# Patient Record
Sex: Male | Born: 1965
Health system: Southern US, Community
[De-identification: ages and names within clinical notes are randomized; demographics above are authoritative.]

## PROBLEM LIST (undated history)

## (undated) DIAGNOSIS — J189 Pneumonia, unspecified organism: Secondary | ICD-10-CM

## (undated) DIAGNOSIS — G4730 Sleep apnea, unspecified: Secondary | ICD-10-CM

## (undated) DIAGNOSIS — E785 Hyperlipidemia, unspecified: Secondary | ICD-10-CM

## (undated) DIAGNOSIS — N183 Chronic kidney disease, stage 3 unspecified: Secondary | ICD-10-CM

## (undated) DIAGNOSIS — Z905 Acquired absence of kidney: Secondary | ICD-10-CM

## (undated) DIAGNOSIS — C801 Malignant (primary) neoplasm, unspecified: Secondary | ICD-10-CM

## (undated) DIAGNOSIS — I499 Cardiac arrhythmia, unspecified: Secondary | ICD-10-CM

## (undated) DIAGNOSIS — I4819 Other persistent atrial fibrillation: Secondary | ICD-10-CM

## (undated) DIAGNOSIS — Z720 Tobacco use: Secondary | ICD-10-CM

## (undated) DIAGNOSIS — E119 Type 2 diabetes mellitus without complications: Secondary | ICD-10-CM

## (undated) DIAGNOSIS — I429 Cardiomyopathy, unspecified: Secondary | ICD-10-CM

## (undated) DIAGNOSIS — Z87442 Personal history of urinary calculi: Secondary | ICD-10-CM

## (undated) DIAGNOSIS — D649 Anemia, unspecified: Secondary | ICD-10-CM

## (undated) DIAGNOSIS — D751 Secondary polycythemia: Secondary | ICD-10-CM

## (undated) DIAGNOSIS — J45998 Other asthma: Secondary | ICD-10-CM

## (undated) DIAGNOSIS — T884XXA Failed or difficult intubation, initial encounter: Secondary | ICD-10-CM

## (undated) DIAGNOSIS — I10 Essential (primary) hypertension: Secondary | ICD-10-CM

## (undated) HISTORY — PX: TONSILLECTOMY: SUR1361

## (undated) HISTORY — DX: Secondary polycythemia: D75.1

## (undated) HISTORY — DX: Other asthma: J45.998

## (undated) HISTORY — DX: Acquired absence of kidney: Z90.5

## (undated) HISTORY — PX: NEPHRECTOMY: SHX65

## (undated) HISTORY — PX: OTHER SURGICAL HISTORY: SHX169

---

## 2003-09-11 ENCOUNTER — Other Ambulatory Visit: Payer: Self-pay

## 2013-01-24 ENCOUNTER — Emergency Department: Payer: Self-pay | Admitting: Emergency Medicine

## 2013-01-24 LAB — URINALYSIS, COMPLETE
Bilirubin,UR: NEGATIVE
Ketone: NEGATIVE
Nitrite: NEGATIVE
Protein: >=500
RBC,UR: 2 /HPF (ref 0–5)
Specific Gravity: 1.033 (ref 1.003–1.030)
Squamous Epithelial: 1

## 2013-01-24 LAB — CBC
HGB: 17 g/dL (ref 13.0–18.0)
MCH: 32.1 pg (ref 26.0–34.0)
MCHC: 34.5 g/dL (ref 32.0–36.0)
Platelet: 172 10*3/uL (ref 150–440)
RBC: 5.29 10*6/uL (ref 4.40–5.90)
RDW: 13.1 % (ref 11.5–14.5)
WBC: 9.5 10*3/uL (ref 3.8–10.6)

## 2013-01-24 LAB — COMPREHENSIVE METABOLIC PANEL
Albumin: 3.6 g/dL (ref 3.4–5.0)
Alkaline Phosphatase: 105 U/L (ref 50–136)
Chloride: 108 mmol/L — ABNORMAL HIGH (ref 98–107)
Creatinine: 1.25 mg/dL (ref 0.60–1.30)
EGFR (Non-African Amer.): >60
Glucose: 120 mg/dL — ABNORMAL HIGH (ref 65–99)
Total Protein: 7.3 g/dL (ref 6.4–8.2)

## 2015-05-05 ENCOUNTER — Encounter: Payer: Self-pay | Admitting: Emergency Medicine

## 2015-05-05 ENCOUNTER — Ambulatory Visit (INDEPENDENT_AMBULATORY_CARE_PROVIDER_SITE_OTHER): Payer: BLUE CROSS/BLUE SHIELD

## 2015-05-05 ENCOUNTER — Ambulatory Visit
Admission: EM | Admit: 2015-05-05 | Discharge: 2015-05-05 | Disposition: A | Discharge status: Home / Self Care | Payer: BLUE CROSS/BLUE SHIELD | Attending: Internal Medicine | Admitting: Internal Medicine

## 2015-05-05 DIAGNOSIS — S143XXA Injury of brachial plexus, initial encounter: Secondary | ICD-10-CM

## 2015-05-05 MED ORDER — PREDNISONE 50 MG PO TABS: 50.0000 mg | ORAL_TABLET | Freq: Every day | ORAL | Status: DC

## 2015-05-05 NOTE — Discharge Instructions (Signed)
Neurapraxia Neurapraxia is a temporary loss of nerve function. It does not cause permanent damage to a nerve. If your foot "falls asleep," that is a type of neurapraxia. It will go away as soon as you start moving your foot. A more serious neurapraxia could take up to 6 weeks to go away. CAUSES   Anything that strains a nerve can cause neurapraxia. The nerve might be stretched or twisted. Something might press or pound on it and cause decreased blood flow to the nerve. Causes of neurapraxia can include:  Bones that break (fracture) or move out of place (dislocation). This can cause the nerves to stretch or twist out of their normal position. This happens most often in the arms and shoulders.  Neck strain when the head moves suddenly, such as in a car crash. This is called whiplash (cervical neurapraxia). It can also happen while playing sports. For example, a football injury called a stinger is a type of neurapraxia. It causes severe pain to shoot down an arm.  Stretching the neck too far from the shoulder. This damages the nerves that go into the shoulder, arm, and hand. It causes numbness and muscle weakness. This condition is called brachial plexus neurapraxia.  Repeated or prolonged pressure can cause decreased blood flow to the nerve (ischemic neurapraxia). Such causes of neurapraxia can include:  A cast or bandage that is too tight around an injured arm or leg. This limits blood flow to the nerves and causes a condition called compartment syndrome. This condition develops when pressure builds up around muscles and nerves.  Infection and disease. This can cut off blood flow to the nerve.  Very cold temperatures.  Sleeping in a position that limits blood flow to your leg or arm. SIGNS AND SYMPTOMS  Numbness and tingling.  Muscle weakness.  Burning pain.  Cool skin. DIAGNOSIS  Neurapraxia is diagnosed through:  A physical exam. This will include asking questions about your health.  Your health care provider will ask about any symptoms you are having. Your health care provider may also:  Test how strong your muscles are.  Check feeling (sensation) in various areas of your body. A very light touch or pricks with a pin may be used.  Check whether you have signs of nerve damage on one side of the body or both.  Tests such as:  Electromyography (EMG). This test measures electrical activity in a muscle. It shows whether the nerve that supplies the muscle is working.  Nerve conduction studies (NCS). They measure the flow of electricity through a nerve.  Magnetic resonance imaging (MRI). This is a machine that uses magnets and a computer to create pictures of your nerves. TREATMENT  Treatment aims to ease pain and swelling and to provide support while your body heals.  Medicine may include:  Pain medicine.  Antidepressants.  Seizure medicine.  Medicine to reduce swelling.  For support, options may include:  Braces, walkers, or crutches.  Physical therapy. Having a specialist work with you often speeds healing. It can also help prevent stiffness and future damage.  Surgery. This may be needed if broken or dislocated bones are part of the problem. Surgery also may be done to relieve pressure on nerves or to restore normal blood flow to nerves and muscles.  Electrical stimulators. These devices send pulses of electricity into the muscles. The aim is to bring back movement. HOME CARE INSTRUCTIONS What you need to do at home will vary. It will depend on your treatment  plan and the type of neurapraxia you have. In general: °· Take medicine as told by your health care provider. Follow the directions carefully. °· Rest. Give your body time to heal. °· Use any splints, braces, or other support devices as directed. If you have questions about their use, ask your health care provider. °· Start physical therapy, if that is suggested. Ask if it is okay to practice the  exercises at home, too. °· If areas of your body are numb, take care to protect them from burns or other injury. °· If you had surgery, you will need to care for your surgical cut (incision). Ask for instructions before you leave the hospital. °· Keep all follow-up appointments with your health care provider. °SEEK MEDICAL CARE IF:  °· You have any questions about your medicine. °· Numbness or muscle weakness continues. °· Pain continues, even after taking pain medicine. °SEEK IMMEDIATE MEDICAL CARE IF:  °· Your pain suddenly becomes severe. °· Numbness or weakness gets much worse. °· Your muscles start to twitch or you have muscle spasms. °  °This information is not intended to replace advice given to you by your health care provider. Make sure you discuss any questions you have with your health care provider. °  °Document Released: 07/31/2010 Document Revised: 03/19/2014 Document Reviewed: 08/30/2014 °Elsevier Interactive Patient Education ©2016 [employer] ° °

## 2015-05-05 NOTE — ED Notes (Signed)
Patient states that 3 weeks ago he was pulling the latch to release a trailer and developed right shoulder pain and has limited ROM.

## 2015-05-05 NOTE — ED Provider Notes (Signed)
CSN: [id number]     Arrival date & time 05/05/15  1621 History   First MD Initiated Contact with Patient 05/05/15 1800     Chief Complaint  Patient presents with  . Shoulder Pain   (Consider location/radiation/quality/duration/timing/severity/associated sxs/prior Treatment) HPI   This 50 year old male who presents with an injury that occurred 3 weeks ago when he was pulling on a lever hard to dislodge a fifth wheel on his truck. He states that he had immediate pain in his neck that radiated into his trapezius and down his arm. The pain bothered him for about a week and then had subsided but he has noticed that he has the inability to abduct his arm away from his side. He has been able to regain some of the motion into it in his elbow and hand at first it if his sounds as if he had loss of function as well. He has a numbness of his thumb but not the index finger. He kept thinking that the pain would go away and was decided that he would eventually regain the use of his shoulder but this has not been the case. At the present time he has some discomfort in his neck  History reviewed. No pertinent past medical history. Past Surgical History  Procedure Laterality Date  . Nephrectomy Right    History reviewed. No pertinent family history. Social History  Substance Use Topics  . Smoking status: Current Every Day Smoker -- 1.00 packs/day    Types: Cigarettes  . Smokeless tobacco: None  . Alcohol Use: Yes    Review of Systems  Constitutional: Positive for activity change. Negative for fever, chills and fatigue.  Musculoskeletal: Positive for neck pain.  All other systems reviewed and are negative.   Allergies  Review of patient's allergies indicates no known allergies.  Home Medications   Prior to Admission medications   Medication Sig Start Date End Date Taking? Authorizing Provider  predniSONE (DELTASONE) 50 MG tablet Take 1 tablet (50 mg total) by mouth daily. 05/05/15   Lorin Picket, PA-C   Meds Ordered and Administered this Visit  Medications - No data to display  BP 120/73 mmHg  Pulse 86  Temp(Src) 98 F (36.7 C) (Oral)  Resp 16  Ht 6' (1.829 m)  Wt 245 lb (111.131 kg)  BMI 33.22 kg/m2  SpO2 97% No data found.   Physical Exam  Constitutional: He is oriented to person, place, and time. He appears well-developed and well-nourished. No distress.  HENT:  Head: Normocephalic and atraumatic.  Nose: Nose normal.  Mouth/Throat: Oropharynx is clear and moist. No oropharyngeal exudate.  Both ear canals are occluded with cerumen  Eyes: Conjunctivae are normal. Pupils are equal, round, and reactive to light.  Neck: Normal range of motion. Neck supple.  Musculoskeletal: He exhibits no edema or tenderness.  Examination cervical spine shows fairly good range of motion with discomfort at the extremes of flexion extension and rotation. Flexion and extension vertically on the left is the most painful. Upper extremity sensation is intact to light touch except for the thumb which has a slight hip esthesia. The patient is unable to abduct his shoulder past approximately 15 but passively has full motion. There is weakness of the biceps and external rotation forearm supination more than pronation.  Neurological: He is alert and oriented to person, place, and time. He exhibits normal muscle tone.  Skin: Skin is warm and dry. He is not diaphoretic.  Psychiatric: He has a normal  mood and affect. His behavior is normal. Judgment and thought content normal.  Nursing note and vitals reviewed.   ED Course  Procedures (including critical care time)  Labs Review Labs Reviewed - No data to display  Imaging Review Dg Cervical Spine Complete  05/05/2015  CLINICAL DATA:  Pt was having pain from the back of his neck down to his fingertips on his right arm for 3 weeks. He believes he hurt it attaching a 5th wheel to his truck. Now he has limited feeling and motion in his right  arm. EXAM: CERVICAL SPINE - COMPLETE 4+ VIEW COMPARISON:  None. FINDINGS: No fracture.  No spondylolisthesis. Endplate spurring noted at C5-C6. No other disc degenerative change. No significant loss disc height. Mild uncovertebral spurring is noted. Left neural foramen are well evaluated with no significant narrowing. Right neural foramina are suboptimally evaluated due to positioning. There is at least mild neural foraminal narrowing on the right from C3-C4 through C5-C6 from uncovertebral spurring. Soft tissues are unremarkable. IMPRESSION: 1. No fracture or acute finding. 2. Right neural foramen are not well evaluated on this study. There is uncovertebral spurring. Some neural foraminal narrowing from C3-C4 through C5-C6 is present. No significant left neural foraminal narrowing. Electronically Signed   By: Lajean Manes M.D.   On: 05/05/2015 19:04   Dg Shoulder Right  05/05/2015  CLINICAL DATA:  Right shoulder pain with limited range of motion. Pain for 3 weeks. EXAM: RIGHT SHOULDER - 2+ VIEW COMPARISON:  None. FINDINGS: There is no evidence of fracture or dislocation. There is no evidence of arthropathy or other focal bone abnormality. Soft tissues are unremarkable. IMPRESSION: Negative radiographs of the right shoulder. Electronically Signed   By: Jeb Levering M.D.   On: 05/05/2015 19:04     Visual Acuity Review  Right Eye Distance:   Left Eye Distance:   Bilateral Distance:    Right Eye Near:   Left Eye Near:    Bilateral Near:         MDM   1. Injury of right brachial plexus, initial encounter    Discharge Medication List as of 05/05/2015  7:25 PM    START taking these medications   Details  predniSONE (DELTASONE) 50 MG tablet Take 1 tablet (50 mg total) by mouth daily., Starting 05/05/2015, Until Discontinued, Normal      Plan: 1. Test/x-ray results and diagnosis reviewed with patient 2. rx as per orders; risks, benefits, potential side effects reviewed with patient 3.  Recommend supportive treatment with rest and maintaining range of motion to prevent frozen shoulder. I will start him a short course of prednisone avoiding them make an appointment with a neurologist for further evaluation studies. His wife would prefer that he be sent to [facility] and she will make the appointment since she is working there is a Presenter, broadcasting. I told him that I like to see have him seen sooner than later it has been already been 3 weeks since the initial injury and he has started to improve somewhat. They will make an appointment as soon as possible 4. F/u prn if symptoms worsen or don't improve     Lorin Picket, PA-C 05/05/15 1941

## 2017-03-14 DIAGNOSIS — J45909 Unspecified asthma, uncomplicated: Secondary | ICD-10-CM | POA: Insufficient documentation

## 2017-03-14 DIAGNOSIS — I10 Essential (primary) hypertension: Secondary | ICD-10-CM | POA: Insufficient documentation

## 2017-05-15 DIAGNOSIS — Z905 Acquired absence of kidney: Secondary | ICD-10-CM | POA: Insufficient documentation

## 2017-11-28 ENCOUNTER — Inpatient Hospital Stay: Payer: Self-pay | Admitting: Internal Medicine

## 2017-12-03 ENCOUNTER — Inpatient Hospital Stay: Payer: Managed Care, Other (non HMO) | Attending: Internal Medicine | Admitting: Internal Medicine

## 2017-12-03 ENCOUNTER — Other Ambulatory Visit: Payer: Self-pay | Admitting: *Deleted

## 2017-12-03 ENCOUNTER — Encounter: Payer: Self-pay | Admitting: *Deleted

## 2017-12-03 ENCOUNTER — Inpatient Hospital Stay: Payer: Managed Care, Other (non HMO)

## 2017-12-03 DIAGNOSIS — R5383 Other fatigue: Secondary | ICD-10-CM | POA: Insufficient documentation

## 2017-12-03 DIAGNOSIS — Z79899 Other long term (current) drug therapy: Secondary | ICD-10-CM | POA: Insufficient documentation

## 2017-12-03 DIAGNOSIS — F17210 Nicotine dependence, cigarettes, uncomplicated: Secondary | ICD-10-CM | POA: Diagnosis not present

## 2017-12-03 DIAGNOSIS — D751 Secondary polycythemia: Secondary | ICD-10-CM | POA: Insufficient documentation

## 2017-12-03 DIAGNOSIS — J45909 Unspecified asthma, uncomplicated: Secondary | ICD-10-CM

## 2017-12-03 LAB — CBC WITH DIFFERENTIAL/PLATELET
BASOS ABS: 0.1 10*3/uL (ref 0–0.1)
Basophils Relative: 1 %
EOS PCT: 3 %
Eosinophils Absolute: 0.3 10*3/uL (ref 0–0.7)
HCT: 49.8 % (ref 40.0–52.0)
HEMOGLOBIN: 17.4 g/dL (ref 13.0–18.0)
LYMPHS ABS: 1.7 10*3/uL (ref 1.0–3.6)
LYMPHS PCT: 20 %
MCH: 32.7 pg (ref 26.0–34.0)
MCHC: 35 g/dL (ref 32.0–36.0)
MCV: 93.5 fL (ref 80.0–100.0)
Monocytes Absolute: 0.7 10*3/uL (ref 0.2–1.0)
Monocytes Relative: 8 %
Neutro Abs: 5.7 10*3/uL (ref 1.4–6.5)
Neutrophils Relative %: 68 %
PLATELETS: 173 10*3/uL (ref 150–440)
RBC: 5.32 MIL/uL (ref 4.40–5.90)
RDW: 13 % (ref 11.5–14.5)
WBC: 8.4 10*3/uL (ref 3.8–10.6)

## 2017-12-03 LAB — LACTATE DEHYDROGENASE: LDH: 131 U/L (ref 98–192)

## 2017-12-03 NOTE — Progress Notes (Signed)
[place] NOTE  Patient Care Team: [name], PA-C as PCP - General (Family Medicine)  CHIEF COMPLAINTS/PURPOSE OF CONSULTATION: ERYTHROCYOTSOS  #Erythrocytosis [SEP 2019- PCP- HCT-56/hb- 41; N-wbc/platelets]  # solitary left kidney [traumatic injury at 62 y]; Smoker; obese/ ? OSA  No history exists.     HISTORY OF PRESENTING ILLNESS:  Tracy Gardner 52 y.o.  male has been referred to [place] for further evaluation recommendations for erythrocytosis.  Patient denies any prior history of strokes or blood clots.  Denies any tingling or numbness in the feet.  Complains of fatigue.  Patient states that he wakes up in the morning tired.  Most of the last 2 to 3 years.  No weight loss.  Appetite is good.  No night sweats.  Patient works as a Administrator.  Patient smokes.   Review of Systems  Constitutional: Positive for malaise/fatigue. Negative for chills, diaphoresis, fever and weight loss.  HENT: Negative for nosebleeds and sore throat.   Eyes: Negative for double vision.  Respiratory: Negative for cough, hemoptysis, sputum production, shortness of breath and wheezing.   Cardiovascular: Negative for chest pain, palpitations, orthopnea and leg swelling.  Gastrointestinal: Negative for abdominal pain, blood in stool, constipation, diarrhea, heartburn, melena, nausea and vomiting.  Genitourinary: Negative for dysuria, frequency and urgency.  Musculoskeletal: Negative for back pain and joint pain.  Skin: Negative.  Negative for itching and rash.  Neurological: Negative for dizziness, tingling, focal weakness, weakness and headaches.  Endo/Heme/Allergies: Does not bruise/bleed easily.  Psychiatric/Behavioral: Negative for depression. The patient is not nervous/anxious and does not have insomnia.      MEDICAL HISTORY:  Past Medical History:  Diagnosis Date  . Asthma, persistent not controlled   . History of nephrectomy, unilateral   . Polycythemia      SURGICAL HISTORY: Past Surgical History:  Procedure Laterality Date  . kidney removal  Right   . 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  Social History   Socioeconomic History  . Marital status: Married    Spouse name: Not on file  . Number of children: Not on file  . Years of education: Not on file  . Highest education level: Not on file  Occupational History  . Not on file  Social Needs  . Financial resource strain: Not on file  . Food insecurity:    Worry: Not on file    Inability: Not on file  . Transportation needs:    Medical: Not on file    Non-medical: Not on file  Tobacco Use  . Smoking status: Current Every Day Smoker    Packs/day: 1.00    Types: Cigarettes  . Smokeless tobacco: Never Used  Substance and Sexual Activity  . Alcohol use: Yes  . Drug use: Not on file  . Sexual activity: Not on file  Lifestyle  . Physical activity:    Days per week: Not on file    Minutes per session: Not on file  . Stress: Not on file  Relationships  . Social connections:    Talks on phone: Not on file    Gets together: Not on file    Attends religious service: Not on file    Active member of club or organization: Not on file    Attends meetings of clubs or organizations: Not on file    Relationship status: Not on file  . Intimate partner violence:  Fear of current or ex partner: Not on file    Emotionally abused: Not on file    Physically abused: Not on file    Forced sexual activity: Not on file  Other Topics Concern  . Not on file  Social History Narrative  . Not on file    FAMILY HISTORY: No history of any blood cancers History reviewed. No pertinent family history.  ALLERGIES:  has No Known Allergies.  MEDICATIONS:  Current Outpatient Medications  Medication Sig Dispense Refill  . albuterol (VENTOLIN HFA) 108 (90 Base) MCG/ACT inhaler Inhale 2 puffs into the lungs every 6 (six) hours as  needed for wheezing.    Marland Kitchen buPROPion (WELLBUTRIN XL) 150 MG 24 hr tablet Take 1 tablet by mouth daily.    . enalapril (VASOTEC) 5 MG tablet Take 1 tablet by mouth daily.    . fluticasone (FLOVENT HFA) 44 MCG/ACT inhaler Inhale 2 puffs into the lungs 2 (two) times daily.    . hydrochlorothiazide (HYDRODIURIL) 12.5 MG tablet Take 1 tablet by mouth daily.     No current facility-administered medications for this visit.       Marland Kitchen  PHYSICAL EXAMINATION: ECOG PERFORMANCE STATUS: 0 - Asymptomatic  Vitals:   12/03/17 1440  BP: 111/68  Pulse: 77  Resp: 16  Temp: 98.1 F (36.7 C)   There were no vitals filed for this visit.  Physical Exam  Constitutional: He is oriented to person, place, and time and well-developed, well-nourished, and in no distress.  With his wife.  Obese.  HENT:  Head: Normocephalic and atraumatic.  Mouth/Throat: Oropharynx is clear and moist. No oropharyngeal exudate.  Eyes: Pupils are equal, round, and reactive to light.  Neck: Normal range of motion. Neck supple.  Cardiovascular: Normal rate and regular rhythm.  Pulmonary/Chest: Breath sounds normal. No respiratory distress. He has no wheezes.  Abdominal: Soft. Bowel sounds are normal. He exhibits no distension and no mass. There is no tenderness. There is no rebound and no guarding.  Musculoskeletal: Normal range of motion. He exhibits no edema or tenderness.  Neurological: He is alert and oriented to person, place, and time.  Skin: Skin is warm.  Psychiatric: Affect normal.     LABORATORY DATA:  I have reviewed the data as listed Lab Results  Component Value Date   WBC 8.4 12/03/2017   HGB 17.4 12/03/2017   HCT 49.8 12/03/2017   MCV 93.5 12/03/2017   PLT 173 12/03/2017   No results for input(s): NA, K, CL, CO2, GLUCOSE, BUN, CREATININE, CALCIUM, GFRNONAA, GFRAA, PROT, ALBUMIN, AST, ALT, ALKPHOS, BILITOT, BILIDIR, IBILI in the last 8760 hours.  RADIOGRAPHIC STUDIES: I have personally reviewed the  radiological images as listed and agreed with the findings in the report. No results found.  ASSESSMENT & PLAN:   Erythrocytosis #Erythrocytosis isolated-suspect secondary-smoking OSA.  Rule out polycythemia vera.  #I had a long discussion with patient regarding multiple causes of elevated hemoglobin-primary bone marrow process versus secondary causes.  Discussed that phlebotomy is usually recommended for polycythemia vera to avoid risk of stroke/thromboembolic events. Role phlebotomy in secondary polycythemia is controversial.  Phlebotomy could be offered for symptom control, however the goal should be to identify the underlying cause/and treat the cause.   #Question etiology of erythrocytosis-suspect obstructive sleep apnea.  Recommend sleep study.  Defer to PCP.  #Active smoker: Discussed with the patient regarding the ill effects of smoking- including but not limited to cardiac lung and vascular diseases and malignancies. Patient on Wellbutrin not  helping as per family.  Defer to PCP.  #Recommend checking CBC erythropoietin Jak 2 LDH.  Recommend phlebotomy next week; follow-up with me in approximately 4 weeks discuss the results of the work-up.  Thank you Dr.Linthavong/Jason Whitaker NP for allowing me to participate in the care of your pleasant patient. Please do not hesitate to contact me with questions or concerns in the interim.  Dr.L/Jaosn whitaker  All questions were answered. The patient knows to call the clinic with any problems, questions or concerns.     Cammie Sickle, MD 12/04/2017 9:20 AM

## 2017-12-03 NOTE — Assessment & Plan Note (Addendum)
#  Erythrocytosis isolated-suspect secondary-smoking OSA.  Rule out polycythemia vera.  #I had a long discussion with patient regarding multiple causes of elevated hemoglobin-primary bone marrow process versus secondary causes.  Discussed that phlebotomy is usually recommended for polycythemia vera to avoid risk of stroke/thromboembolic events. Role phlebotomy in secondary polycythemia is controversial.  Phlebotomy could be offered for symptom control, however the goal should be to identify the underlying cause/and treat the cause.   #Question etiology of erythrocytosis-suspect obstructive sleep apnea.  Recommend sleep study.  Defer to PCP.  #Active smoker: Discussed with the patient regarding the ill effects of smoking- including but not limited to cardiac lung and vascular diseases and malignancies. Patient on Wellbutrin not helping as per family.  Defer to PCP.  #Recommend checking CBC erythropoietin Jak 2 LDH.  Recommend phlebotomy next week; follow-up with me in approximately 4 weeks discuss the results of the work-up.  Thank you Dr.Linthavong/Jason Whitaker NP for allowing me to participate in the care of your pleasant patient. Please do not hesitate to contact me with questions or concerns in the interim.  Dr.L/Jaosn whitaker

## 2017-12-04 LAB — ERYTHROPOIETIN: Erythropoietin: 16.7 m[IU]/mL (ref 2.6–18.5)

## 2017-12-06 LAB — JAK2 GENOTYPR

## 2017-12-10 ENCOUNTER — Inpatient Hospital Stay: Payer: Managed Care, Other (non HMO) | Attending: Internal Medicine

## 2017-12-10 VITALS — BP 139/77 | HR 76 | Temp 96.8°F | Resp 18

## 2017-12-10 DIAGNOSIS — D751 Secondary polycythemia: Secondary | ICD-10-CM | POA: Diagnosis present

## 2017-12-10 NOTE — Patient Instructions (Signed)

## 2017-12-25 ENCOUNTER — Other Ambulatory Visit: Payer: Self-pay | Admitting: *Deleted

## 2017-12-25 DIAGNOSIS — D751 Secondary polycythemia: Secondary | ICD-10-CM

## 2017-12-31 ENCOUNTER — Ambulatory Visit: Payer: Managed Care, Other (non HMO) | Admitting: Internal Medicine

## 2017-12-31 ENCOUNTER — Other Ambulatory Visit: Payer: Managed Care, Other (non HMO)

## 2018-01-02 ENCOUNTER — Inpatient Hospital Stay: Payer: Managed Care, Other (non HMO)

## 2018-01-02 ENCOUNTER — Inpatient Hospital Stay: Payer: Managed Care, Other (non HMO) | Admitting: Internal Medicine

## 2018-01-10 ENCOUNTER — Inpatient Hospital Stay: Payer: Managed Care, Other (non HMO) | Attending: Internal Medicine

## 2018-01-10 ENCOUNTER — Inpatient Hospital Stay: Payer: Managed Care, Other (non HMO)

## 2018-01-10 ENCOUNTER — Inpatient Hospital Stay (HOSPITAL_BASED_OUTPATIENT_CLINIC_OR_DEPARTMENT_OTHER): Payer: Managed Care, Other (non HMO) | Admitting: Internal Medicine

## 2018-01-10 ENCOUNTER — Ambulatory Visit
Admission: RE | Admit: 2018-01-10 | Discharge: 2018-01-10 | Disposition: A | Discharge status: Home / Self Care | Payer: Managed Care, Other (non HMO) | Source: Ambulatory Visit | Attending: Internal Medicine | Admitting: Internal Medicine

## 2018-01-10 ENCOUNTER — Telehealth: Payer: Self-pay | Admitting: Internal Medicine

## 2018-01-10 VITALS — BP 133/82 | HR 82 | Temp 97.8°F | Resp 16 | Wt 268.0 lb

## 2018-01-10 DIAGNOSIS — R05 Cough: Secondary | ICD-10-CM

## 2018-01-10 DIAGNOSIS — Z79899 Other long term (current) drug therapy: Secondary | ICD-10-CM

## 2018-01-10 DIAGNOSIS — Z905 Acquired absence of kidney: Secondary | ICD-10-CM | POA: Diagnosis not present

## 2018-01-10 DIAGNOSIS — F17210 Nicotine dependence, cigarettes, uncomplicated: Secondary | ICD-10-CM | POA: Insufficient documentation

## 2018-01-10 DIAGNOSIS — D751 Secondary polycythemia: Secondary | ICD-10-CM

## 2018-01-10 DIAGNOSIS — R059 Cough, unspecified: Secondary | ICD-10-CM

## 2018-01-10 LAB — CBC WITH DIFFERENTIAL/PLATELET
ABS IMMATURE GRANULOCYTES: 0.13 10*3/uL — AB (ref 0.00–0.07)
BASOS PCT: 1 %
Basophils Absolute: 0.1 10*3/uL (ref 0.0–0.1)
Eosinophils Absolute: 0.4 10*3/uL (ref 0.0–0.5)
Eosinophils Relative: 4 %
HEMATOCRIT: 50.3 % (ref 39.0–52.0)
HEMOGLOBIN: 17 g/dL (ref 13.0–17.0)
IMMATURE GRANULOCYTES: 1 %
LYMPHS ABS: 2.1 10*3/uL (ref 0.7–4.0)
LYMPHS PCT: 23 %
MCH: 30.7 pg (ref 26.0–34.0)
MCHC: 33.8 g/dL (ref 30.0–36.0)
MCV: 90.8 fL (ref 80.0–100.0)
MONO ABS: 0.7 10*3/uL (ref 0.1–1.0)
MONOS PCT: 8 %
NEUTROS ABS: 5.7 10*3/uL (ref 1.7–7.7)
Neutrophils Relative %: 63 %
Platelets: 194 10*3/uL (ref 150–400)
RBC: 5.54 MIL/uL (ref 4.22–5.81)
RDW: 11.9 % (ref 11.5–15.5)
WBC: 9 10*3/uL (ref 4.0–10.5)
nRBC: 0 % (ref 0.0–0.2)

## 2018-01-10 NOTE — Telephone Encounter (Signed)
Patient notified of these lab results and verbalized understanding.   

## 2018-01-10 NOTE — Assessment & Plan Note (Addendum)
#  Erythrocytosis isolated-suspect secondary-smoking OSA.;  Normal erythropoietin Jak 2- negative.  Would recommend holding off phlebotomy at this time.  Check chest x-ray today.  #Question etiology of erythrocytosis-suspect obstructive sleep apnea.  Recommend sleep study.  Defer to PCP.  # Weight gain-sec to smoking-counseled the patient.  Defer to PCP for further management.  #Smoker : quit smoking; congratulated the patient.  See above  # DISPOSITION:  # No phlebotomy today. # CXR today # follow up in 4 months/H&H/possible phlebotomy- Dr.B  Addendum: Chest x-ray negative.   Dr.L/Jaosn whitaker, NP

## 2018-01-10 NOTE — Telephone Encounter (Signed)
Please inform patient that chest x-ray is normal.  Follow-up as planned no new recommendations. thx GB

## 2018-01-13 NOTE — Progress Notes (Signed)
[place] NOTE  Patient Care Team: [name], PA-C as PCP - General (Family Medicine)  CHIEF COMPLAINTS/PURPOSE OF CONSULTATION: ERYTHROCYOTSOS  #Erythrocytosis [SEP 2019- PCP- HCT-56/hb- 36; N-wbc/platelets]; JAK- 2 NEG; Erythropoietin-Normal.   # solitary left kidney [traumatic injury at 8 y]; Smoker- Oct 2019- QUIT; obese/ ? OSA  No history exists.     HISTORY OF PRESENTING ILLNESS:  Tracy Gardner 52 y.o.  male is here for follow-up of erythrocytosis/review labs.  Patient had episode of phlebotomy for elevated hematocrit-noted to have some improvement of fatigue.  He continues to have fatigue.  No weight loss.  No night sweats.  Patient's biggest complaint today is weight gain which he admits to him including smoking.   Review of Systems  Constitutional: Positive for malaise/fatigue. Negative for chills, diaphoresis, fever and weight loss.  HENT: Negative for nosebleeds and sore throat.   Eyes: Negative for double vision.  Respiratory: Negative for cough, hemoptysis, sputum production, shortness of breath and wheezing.   Cardiovascular: Negative for chest pain, palpitations, orthopnea and leg swelling.  Gastrointestinal: Negative for abdominal pain, blood in stool, constipation, diarrhea, heartburn, melena, nausea and vomiting.  Genitourinary: Negative for dysuria, frequency and urgency.  Musculoskeletal: Negative for back pain and joint pain.  Skin: Negative.  Negative for itching and rash.  Neurological: Negative for dizziness, tingling, focal weakness, weakness and headaches.  Endo/Heme/Allergies: Does not bruise/bleed easily.  Psychiatric/Behavioral: Negative for depression. The patient is not nervous/anxious and does not have insomnia.      MEDICAL HISTORY:  Past Medical History:  Diagnosis Date  . Asthma, persistent not controlled   . History of nephrectomy, unilateral   . Polycythemia     SURGICAL HISTORY: Past Surgical  History:  Procedure Laterality Date  . kidney removal  Right   . 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  Social History   Socioeconomic History  . Marital status: Married    Spouse name: Not on file  . Number of children: Not on file  . Years of education: Not on file  . Highest education level: Not on file  Occupational History  . Not on file  Social Needs  . Financial resource strain: Not on file  . Food insecurity:    Worry: Not on file    Inability: Not on file  . Transportation needs:    Medical: Not on file    Non-medical: Not on file  Tobacco Use  . Smoking status: Current Every Day Smoker    Packs/day: 1.00    Types: Cigarettes  . Smokeless tobacco: Never Used  Substance and Sexual Activity  . Alcohol use: Yes  . Drug use: Not on file  . Sexual activity: Not on file  Lifestyle  . Physical activity:    Days per week: Not on file    Minutes per session: Not on file  . Stress: Not on file  Relationships  . Social connections:    Talks on phone: Not on file    Gets together: Not on file    Attends religious service: Not on file    Active member of club or organization: Not on file    Attends meetings of clubs or organizations: Not on file    Relationship status: Not on file  . Intimate partner violence:    Fear of current or ex partner: Not on file    Emotionally abused: Not on file  Physically abused: Not on file    Forced sexual activity: Not on file  Other Topics Concern  . Not on file  Social History Narrative  . Not on file    FAMILY HISTORY: No history of any blood cancers No family history on file.  ALLERGIES:  has No Known Allergies.  MEDICATIONS:  Current Outpatient Medications  Medication Sig Dispense Refill  . albuterol (VENTOLIN HFA) 108 (90 Base) MCG/ACT inhaler Inhale 2 puffs into the lungs every 6 (six) hours as needed for wheezing.    Marland Kitchen buPROPion (WELLBUTRIN XL) 150 MG 24  hr tablet Take 1 tablet by mouth daily.    . enalapril (VASOTEC) 5 MG tablet Take 1 tablet by mouth daily.    . fluticasone (FLOVENT HFA) 44 MCG/ACT inhaler Inhale 2 puffs into the lungs 2 (two) times daily.    . hydrochlorothiazide (HYDRODIURIL) 12.5 MG tablet Take 1 tablet by mouth daily.     No current facility-administered medications for this visit.       Marland Kitchen  PHYSICAL EXAMINATION: ECOG PERFORMANCE STATUS: 0 - Asymptomatic  Vitals:   01/10/18 1412 01/10/18 1415  BP:  133/82  Pulse:  82  Resp: 16 16  Temp:  97.8 F (36.6 C)  SpO2:  96%   Filed Weights   01/10/18 1412  Weight: 268 lb (121.6 kg)    Physical Exam  Constitutional: He is oriented to person, place, and time and well-developed, well-nourished, and in no distress.  He is alone.   HENT:  Head: Normocephalic and atraumatic.  Mouth/Throat: Oropharynx is clear and moist. No oropharyngeal exudate.  Eyes: Pupils are equal, round, and reactive to light.  Neck: Normal range of motion. Neck supple.  Cardiovascular: Normal rate and regular rhythm.  Pulmonary/Chest: Breath sounds normal. No respiratory distress. He has no wheezes.  Abdominal: Soft. Bowel sounds are normal. He exhibits no distension and no mass. There is no tenderness. There is no rebound and no guarding.  Musculoskeletal: Normal range of motion. He exhibits no edema or tenderness.  Neurological: He is alert and oriented to person, place, and time.  Skin: Skin is warm.  Psychiatric: Affect normal.     LABORATORY DATA:  I have reviewed the data as listed Lab Results  Component Value Date   WBC 9.0 01/10/2018   HGB 17.0 01/10/2018   HCT 50.3 01/10/2018   MCV 90.8 01/10/2018   PLT 194 01/10/2018   No results for input(s): NA, K, CL, CO2, GLUCOSE, BUN, CREATININE, CALCIUM, GFRNONAA, GFRAA, PROT, ALBUMIN, AST, ALT, ALKPHOS, BILITOT, BILIDIR, IBILI in the last 8760 hours.  RADIOGRAPHIC STUDIES: I have personally reviewed the radiological images as  listed and agreed with the findings in the report. Dg Chest 2 View  Result Date: 01/10/2018 CLINICAL DATA:  52 y/o  M; erythrocytosis. Fatigue and cough. EXAM: CHEST - 2 VIEW COMPARISON:  01/24/2013 chest radiograph FINDINGS: Stable heart size and mediastinal contours are within normal limits. Both lungs are clear. The visualized skeletal structures are unremarkable. IMPRESSION: No acute pulmonary process identified. Electronically Signed   By: Kristine Garbe M.D.   On: 01/10/2018 15:20    ASSESSMENT & PLAN:   Erythrocytosis #Erythrocytosis isolated-suspect secondary-smoking OSA.;  Normal erythropoietin Jak 2- negative.  Would recommend holding off phlebotomy at this time.  Check chest x-ray today.  #Question etiology of erythrocytosis-suspect obstructive sleep apnea.  Recommend sleep study.  Defer to PCP.  # Weight gain-sec to smoking-counseled the patient.  Defer to PCP for further management.  #  Smoker : quit smoking; congratulated the patient.  See above  # DISPOSITION:  # No phlebotomy today. # CXR today # follow up in 4 months/H&H/possible phlebotomy- Dr.B  Addendum: Chest x-ray negative.   Dr.L/Jaosn whitaker, NP  All questions were answered. The patient knows to call the clinic with any problems, questions or concerns.     Cammie Sickle, MD 01/19/2018 5:59 PM

## 2018-03-14 ENCOUNTER — Inpatient Hospital Stay: Payer: Managed Care, Other (non HMO) | Attending: Internal Medicine

## 2018-03-14 ENCOUNTER — Other Ambulatory Visit: Payer: Self-pay | Admitting: *Deleted

## 2018-03-14 ENCOUNTER — Other Ambulatory Visit: Payer: Self-pay

## 2018-03-14 ENCOUNTER — Telehealth: Payer: Self-pay | Admitting: *Deleted

## 2018-03-14 ENCOUNTER — Inpatient Hospital Stay: Payer: Managed Care, Other (non HMO)

## 2018-03-14 VITALS — BP 104/67 | HR 71 | Temp 96.8°F | Resp 18

## 2018-03-14 DIAGNOSIS — D751 Secondary polycythemia: Secondary | ICD-10-CM

## 2018-03-14 DIAGNOSIS — F17210 Nicotine dependence, cigarettes, uncomplicated: Secondary | ICD-10-CM | POA: Diagnosis not present

## 2018-03-14 LAB — HEMOGLOBIN: HEMOGLOBIN: 16.7 g/dL (ref 13.0–17.0)

## 2018-03-14 LAB — HEMATOCRIT: HCT: 50.2 % (ref 39.0–52.0)

## 2018-03-14 NOTE — Telephone Encounter (Signed)
Patient called requesting to come in today for lab and phlebotomy as he is not feeling well. His next appointment is not until 05/16/18. Please advise

## 2018-03-14 NOTE — Telephone Encounter (Signed)
Appointment scheduled for 2 PM today and accepted

## 2018-03-14 NOTE — Telephone Encounter (Signed)
I spoke with Dr. Rogue Bussing. Ok to proceed with lab/possible phlebotomy today (H&H)

## 2018-05-16 ENCOUNTER — Inpatient Hospital Stay: Payer: Managed Care, Other (non HMO)

## 2018-05-16 ENCOUNTER — Inpatient Hospital Stay: Payer: Managed Care, Other (non HMO) | Admitting: Internal Medicine

## 2018-05-20 ENCOUNTER — Inpatient Hospital Stay: Payer: Managed Care, Other (non HMO) | Attending: Internal Medicine

## 2018-05-20 ENCOUNTER — Encounter (INDEPENDENT_AMBULATORY_CARE_PROVIDER_SITE_OTHER): Payer: Self-pay

## 2018-05-20 ENCOUNTER — Other Ambulatory Visit: Payer: Self-pay

## 2018-05-20 ENCOUNTER — Inpatient Hospital Stay: Payer: Managed Care, Other (non HMO)

## 2018-05-20 ENCOUNTER — Inpatient Hospital Stay (HOSPITAL_BASED_OUTPATIENT_CLINIC_OR_DEPARTMENT_OTHER): Payer: Managed Care, Other (non HMO) | Admitting: Internal Medicine

## 2018-05-20 ENCOUNTER — Encounter: Payer: Self-pay | Admitting: Internal Medicine

## 2018-05-20 VITALS — BP 125/79 | HR 90 | Temp 97.0°F | Resp 16 | Wt 272.0 lb

## 2018-05-20 VITALS — BP 116/76 | HR 68

## 2018-05-20 DIAGNOSIS — Z79899 Other long term (current) drug therapy: Secondary | ICD-10-CM

## 2018-05-20 DIAGNOSIS — Z905 Acquired absence of kidney: Secondary | ICD-10-CM | POA: Diagnosis not present

## 2018-05-20 DIAGNOSIS — J45909 Unspecified asthma, uncomplicated: Secondary | ICD-10-CM

## 2018-05-20 DIAGNOSIS — F17210 Nicotine dependence, cigarettes, uncomplicated: Secondary | ICD-10-CM | POA: Insufficient documentation

## 2018-05-20 DIAGNOSIS — D751 Secondary polycythemia: Secondary | ICD-10-CM

## 2018-05-20 DIAGNOSIS — R5383 Other fatigue: Secondary | ICD-10-CM | POA: Diagnosis not present

## 2018-05-20 LAB — HEMATOCRIT: HEMATOCRIT: 51 % (ref 39.0–52.0)

## 2018-05-20 LAB — HEMOGLOBIN: HEMOGLOBIN: 16.9 g/dL (ref 13.0–17.0)

## 2018-05-20 NOTE — Progress Notes (Signed)
[place] NOTE  Patient Care Team: [name], PA-C as PCP - General (Family Medicine)  CHIEF COMPLAINTS/PURPOSE OF CONSULTATION: ERYTHROCYOTSOS  #Erythrocytosis [SEP 2019- PCP- HCT-56/hb- 74; N-wbc/platelets]; JAK- 2 NEG; Erythropoietin-Normal.   # solitary left kidney [traumatic injury at 37 y]; Smoker- Oct 2019- QUIT; obese/ ? OSA  No history exists.     HISTORY OF PRESENTING ILLNESS:  Tracy Gardner 53 y.o.  male is here for follow-up of erythrocytosis/review labs.  Patient notes significant improvement of his symptoms of fatigue shortness of breath with exertion and phlebotomy.  Admits to weight gain because of quitting smoking.  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   Review of Systems  Constitutional: Positive for malaise/fatigue. Negative for chills, diaphoresis, fever and weight loss.  HENT: Negative for nosebleeds and sore throat.   Eyes: Negative for double vision.  Respiratory: Negative for cough, hemoptysis, sputum production, shortness of breath and wheezing.   Cardiovascular: Negative for chest pain, palpitations, orthopnea and leg swelling.  Gastrointestinal: Negative for abdominal pain, blood in stool, constipation, diarrhea, heartburn, melena, nausea and vomiting.  Genitourinary: Negative for dysuria, frequency and urgency.  Musculoskeletal: Negative for back pain and joint pain.  Skin: Negative.  Negative for itching and rash.  Neurological: Negative for dizziness, tingling, focal weakness, weakness and headaches.  Endo/Heme/Allergies: Does not bruise/bleed easily.  Psychiatric/Behavioral: Negative for depression. The patient is not nervous/anxious and does not have insomnia.      MEDICAL HISTORY:  Past Medical History:  Diagnosis Date  . Asthma, persistent not controlled   . History of nephrectomy, unilateral   . Polycythemia     SURGICAL HISTORY: Past Surgical  History:  Procedure Laterality Date  . kidney removal  Right   . 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  Social History   Socioeconomic History  . Marital status: Married    Spouse name: Not on file  . Number of children: Not on file  . Years of education: Not on file  . Highest education level: Not on file  Occupational History  . Not on file  Social Needs  . Financial resource strain: Not on file  . Food insecurity:    Worry: Not on file    Inability: Not on file  . Transportation needs:    Medical: Not on file    Non-medical: Not on file  Tobacco Use  . Smoking status: Current Every Day Smoker    Packs/day: 1.00    Types: Cigarettes  . Smokeless tobacco: Never Used  Substance and Sexual Activity  . Alcohol use: Yes  . Drug use: Not on file  . Sexual activity: Not on file  Lifestyle  . Physical activity:    Days per week: Not on file    Minutes per session: Not on file  . Stress: Not on file  Relationships  . Social connections:    Talks on phone: Not on file    Gets together: Not on file    Attends religious service: Not on file    Active member of club or organization: Not on file    Attends meetings of clubs or organizations: Not on file    Relationship status: Not on file  . Intimate partner violence:    Fear of current or ex partner: Not on file    Emotionally abused: Not on file  Physically abused: Not on file    Forced sexual activity: Not on file  Other Topics Concern  . Not on file  Social History Narrative  . Not on file    FAMILY HISTORY: No history of any blood cancers History reviewed. No pertinent family history.  ALLERGIES:  has No Known Allergies.  MEDICATIONS:  Current Outpatient Medications  Medication Sig Dispense Refill  . albuterol (VENTOLIN HFA) 108 (90 Base) MCG/ACT inhaler Inhale 2 puffs into the lungs every 6 (six) hours as needed for wheezing.    Marland Kitchen buPROPion  (WELLBUTRIN XL) 150 MG 24 hr tablet Take 1 tablet by mouth daily.    . enalapril (VASOTEC) 5 MG tablet Take 1 tablet by mouth daily.    . fluticasone (FLOVENT HFA) 44 MCG/ACT inhaler Inhale 2 puffs into the lungs 2 (two) times daily.    . hydrochlorothiazide (HYDRODIURIL) 12.5 MG tablet Take 1 tablet by mouth daily.     No current facility-administered medications for this visit.       Marland Kitchen  PHYSICAL EXAMINATION: ECOG PERFORMANCE STATUS: 0 - Asymptomatic  Vitals:   05/20/18 0902  BP: 125/79  Pulse: 90  Resp: 16  Temp: (!) 97 F (36.1 C)   Filed Weights   05/20/18 0902  Weight: 272 lb (123.4 kg)    Physical Exam  Constitutional: He is oriented to person, place, and time and well-developed, well-nourished, and in no distress.  He is alone.   HENT:  Head: Normocephalic and atraumatic.  Mouth/Throat: Oropharynx is clear and moist. No oropharyngeal exudate.  Eyes: Pupils are equal, round, and reactive to light.  Neck: Normal range of motion. Neck supple.  Cardiovascular: Normal rate and regular rhythm.  Pulmonary/Chest: Breath sounds normal. No respiratory distress. He has no wheezes.  Abdominal: Soft. Bowel sounds are normal. He exhibits no distension and no mass. There is no abdominal tenderness. There is no rebound and no guarding.  Musculoskeletal: Normal range of motion.        General: No tenderness or edema.  Neurological: He is alert and oriented to person, place, and time.  Skin: Skin is warm.  Psychiatric: Affect normal.     LABORATORY DATA:  I have reviewed the data as listed Lab Results  Component Value Date   WBC 9.0 01/10/2018   HGB 16.9 05/20/2018   HCT 51.0 05/20/2018   MCV 90.8 01/10/2018   PLT 194 01/10/2018   No results for input(s): NA, K, CL, CO2, GLUCOSE, BUN, CREATININE, CALCIUM, GFRNONAA, GFRAA, PROT, ALBUMIN, AST, ALT, ALKPHOS, BILITOT, BILIDIR, IBILI in the last 8760 hours.  RADIOGRAPHIC STUDIES: I have personally reviewed the radiological  images as listed and agreed with the findings in the report. No results found.  ASSESSMENT & PLAN:   Erythrocytosis #Erythrocytosis isolated-suspect secondary-patient feels significantly improved post phlebotomy.  # Etiology- ? Hx of smoking/ OSA; recommend sleep study. Defer to PCP  # smoking- cut significantly.   # DISPOSITION:  # Phlebotomy today #  3 months- H&H/ possible phlebotomy # follow up in 6 months-MD- H&H/possible phlebotomy- Dr.B  Dr.L/Jaosn whitaker, NP  All questions were answered. The patient knows to call the clinic with any problems, questions or concerns.     Cammie Sickle, MD 05/20/2018 9:19 AM

## 2018-05-20 NOTE — Assessment & Plan Note (Signed)
#  Erythrocytosis isolated-suspect secondary-patient feels significantly improved post phlebotomy.  # Etiology- ? Hx of smoking/ OSA; recommend sleep study. Defer to PCP  # smoking- cut significantly.   # DISPOSITION:  # Phlebotomy today #  3 months- H&H/ possible phlebotomy # follow up in 6 months-MD- H&H/possible phlebotomy- Dr.B  Dr.L/Jaosn whitaker, NP

## 2018-08-19 ENCOUNTER — Other Ambulatory Visit: Payer: Managed Care, Other (non HMO)

## 2018-08-21 ENCOUNTER — Other Ambulatory Visit: Payer: Self-pay

## 2018-08-21 ENCOUNTER — Telehealth: Payer: Self-pay | Admitting: Internal Medicine

## 2018-08-21 ENCOUNTER — Other Ambulatory Visit: Payer: Self-pay | Admitting: *Deleted

## 2018-08-21 DIAGNOSIS — D751 Secondary polycythemia: Secondary | ICD-10-CM

## 2018-08-21 NOTE — Telephone Encounter (Signed)
Spoke to pt and completed travel screen. Also explained about addl screening questions they will be asked, new guidelines about mask req, no visitors, and fever checks °

## 2018-08-22 ENCOUNTER — Other Ambulatory Visit: Payer: Self-pay

## 2018-08-22 ENCOUNTER — Inpatient Hospital Stay: Payer: Managed Care, Other (non HMO) | Attending: Internal Medicine

## 2018-08-22 ENCOUNTER — Inpatient Hospital Stay: Payer: Managed Care, Other (non HMO)

## 2018-08-22 VITALS — BP 107/70 | HR 80 | Temp 97.4°F | Resp 18

## 2018-08-22 DIAGNOSIS — D751 Secondary polycythemia: Secondary | ICD-10-CM | POA: Diagnosis present

## 2018-08-22 DIAGNOSIS — F17210 Nicotine dependence, cigarettes, uncomplicated: Secondary | ICD-10-CM | POA: Diagnosis not present

## 2018-08-22 DIAGNOSIS — Z79899 Other long term (current) drug therapy: Secondary | ICD-10-CM | POA: Diagnosis not present

## 2018-08-22 LAB — HEMATOCRIT: HCT: 49.7 % (ref 39.0–52.0)

## 2018-08-22 LAB — HEMOGLOBIN: Hemoglobin: 16.6 g/dL (ref 13.0–17.0)

## 2018-08-22 NOTE — Progress Notes (Signed)
Therapeutic phlebotomy performed per MD order (HCT 49.7) using 20 gauge PIV in left AC, removing 350cc. Pt tolerated procedure well. Snack and drink offered.

## 2018-11-18 ENCOUNTER — Ambulatory Visit: Payer: Managed Care, Other (non HMO) | Admitting: Internal Medicine

## 2018-11-18 ENCOUNTER — Other Ambulatory Visit: Payer: Managed Care, Other (non HMO)

## 2018-11-27 ENCOUNTER — Other Ambulatory Visit: Payer: Managed Care, Other (non HMO)

## 2018-11-27 ENCOUNTER — Ambulatory Visit: Payer: Managed Care, Other (non HMO) | Admitting: Internal Medicine

## 2018-11-27 ENCOUNTER — Other Ambulatory Visit: Payer: Self-pay

## 2018-11-27 DIAGNOSIS — D751 Secondary polycythemia: Secondary | ICD-10-CM

## 2018-11-27 NOTE — Progress Notes (Signed)
Called patient for pre-visit evaluation.  Patient states no new concerns today

## 2018-11-28 ENCOUNTER — Other Ambulatory Visit: Payer: Self-pay

## 2018-11-28 ENCOUNTER — Inpatient Hospital Stay: Payer: 59

## 2018-11-28 ENCOUNTER — Inpatient Hospital Stay: Payer: 59 | Attending: Internal Medicine

## 2018-11-28 ENCOUNTER — Encounter: Payer: Self-pay | Admitting: Internal Medicine

## 2018-11-28 ENCOUNTER — Other Ambulatory Visit: Payer: Self-pay | Admitting: *Deleted

## 2018-11-28 ENCOUNTER — Inpatient Hospital Stay (HOSPITAL_BASED_OUTPATIENT_CLINIC_OR_DEPARTMENT_OTHER): Payer: 59 | Admitting: Internal Medicine

## 2018-11-28 VITALS — BP 111/75 | HR 81 | Resp 20

## 2018-11-28 DIAGNOSIS — R809 Proteinuria, unspecified: Secondary | ICD-10-CM | POA: Insufficient documentation

## 2018-11-28 DIAGNOSIS — D751 Secondary polycythemia: Secondary | ICD-10-CM | POA: Diagnosis not present

## 2018-11-28 DIAGNOSIS — F17210 Nicotine dependence, cigarettes, uncomplicated: Secondary | ICD-10-CM | POA: Insufficient documentation

## 2018-11-28 DIAGNOSIS — Z79899 Other long term (current) drug therapy: Secondary | ICD-10-CM | POA: Insufficient documentation

## 2018-11-28 DIAGNOSIS — Z905 Acquired absence of kidney: Secondary | ICD-10-CM | POA: Insufficient documentation

## 2018-11-28 DIAGNOSIS — M7989 Other specified soft tissue disorders: Secondary | ICD-10-CM | POA: Insufficient documentation

## 2018-11-28 DIAGNOSIS — R0602 Shortness of breath: Secondary | ICD-10-CM | POA: Diagnosis not present

## 2018-11-28 DIAGNOSIS — R5383 Other fatigue: Secondary | ICD-10-CM | POA: Insufficient documentation

## 2018-11-28 DIAGNOSIS — Z131 Encounter for screening for diabetes mellitus: Secondary | ICD-10-CM

## 2018-11-28 LAB — HEMOGLOBIN A1C
Hgb A1c MFr Bld: 6.7 % — ABNORMAL HIGH (ref 4.8–5.6)
Mean Plasma Glucose: 145.59 mg/dL

## 2018-11-28 LAB — HEMOGLOBIN: Hemoglobin: 17 g/dL (ref 13.0–17.0)

## 2018-11-28 LAB — HEMATOCRIT: HCT: 51.5 % (ref 39.0–52.0)

## 2018-11-28 NOTE — Assessment & Plan Note (Addendum)
#   Erythrocytosis isolated-suspect secondary/question OSA; however patient  feels significantly improved post phlebotomy.  Today hematocrit is 51.  Proceed with phlebotomy every 2 months/patient's preference.   #History of proteinuria/frothy urine-chronic unclear etiology.  Recommend/defer further work-up including nephrology evaluation to PCP.  Ordered hemoglobin A1c as per patient's/PCPs preference.   # DISPOSITION:  # Phlebotomy today #  2 months- H&H/ possible phlebotomy # 4 months- H&H// possible phlebotomy # follow up in 6 months-MD- H&H/possible phlebotomy- Dr.B  Dr.L/Jaosn whitaker, NP

## 2018-11-28 NOTE — Progress Notes (Signed)
Pt in for follow up, denies any concerns today. 

## 2018-11-28 NOTE — Progress Notes (Signed)
[place] NOTE  Patient Care Team: [name], PA-C as PCP - General (Family Medicine)  CHIEF COMPLAINTS/PURPOSE OF CONSULTATION: ERYTHROCYOTSOS  #Erythrocytosis [SEP 2019- PCP- HCT-56/hb- 46; N-wbc/platelets]; JAK- 2 NEG; Erythropoietin-Normal.   # solitary left kidney [traumatic injury at 75 y]; Smoker- Oct 2019- QUIT; obese/ ? OSA' ? Proteinuria  Oncology History   No history exists.   HISTORY OF PRESENTING ILLNESS:  Tracy Gardner 53 y.o.  male is here for follow-up of erythrocytosis.  Patient has been getting phlebotomy every 3 months also.  He feels significant improvement of his fatigue and shortness of breath post phlebotomy.  He states that he has had proteinuria for many months.  Also noted to have falling of the urine.  Mild swelling in the legs not any worse.  No nausea no vomiting.  Review of Systems  Constitutional: Positive for malaise/fatigue. Negative for chills, diaphoresis, fever and weight loss.  HENT: Negative for nosebleeds and sore throat.   Eyes: Negative for double vision.  Respiratory: Negative for cough, hemoptysis, sputum production, shortness of breath and wheezing.   Cardiovascular: Negative for chest pain, palpitations, orthopnea and leg swelling.  Gastrointestinal: Negative for abdominal pain, blood in stool, constipation, diarrhea, heartburn, melena, nausea and vomiting.  Genitourinary: Negative for dysuria, frequency and urgency.  Musculoskeletal: Negative for back pain and joint pain.  Skin: Negative.  Negative for itching and rash.  Neurological: Negative for dizziness, tingling, focal weakness, weakness and headaches.  Endo/Heme/Allergies: Does not bruise/bleed easily.  Psychiatric/Behavioral: Negative for depression. The patient is not nervous/anxious and does not have insomnia.      MEDICAL HISTORY:  Past Medical History:  Diagnosis Date  . Asthma, persistent not controlled   . History of nephrectomy,  unilateral   . Polycythemia     SURGICAL HISTORY: Past Surgical History:  Procedure Laterality Date  . kidney removal  Right   . NEPHRECTOMY Right     SOCIAL HISTORY:  Social History   Socioeconomic History  . Marital status: Married    Spouse name: Not on file  . Number of children: Not on file  . Years of education: Not on file  . Highest education level: Not on file  Occupational History  . Not on file  Social Needs  . Financial resource strain: Not on file  . Food insecurity    Worry: Not on file    Inability: Not on file  . Transportation needs    Medical: Not on file    Non-medical: Not on file  Tobacco Use  . Smoking status: Current Every Day Smoker    Packs/day: 1.00    Types: Cigarettes  . Smokeless tobacco: Never Used  Substance and Sexual Activity  . Alcohol use: Yes  . Drug use: Not on file  . Sexual activity: Not on file  Lifestyle  . Physical activity    Days per week: Not on file    Minutes per session: Not on file  . Stress: Not on file  Relationships  . Social Herbalist on phone: Not on file    Gets together: Not on file    Attends religious service: Not on file    Active member of club or organization: Not on file    Attends meetings of clubs or organizations: Not on file    Relationship status: Not on file  . Intimate partner violence    Fear of current or ex partner: Not on file  Emotionally abused: Not on file    Physically abused: Not on file    Forced sexual activity: Not on file  Other Topics Concern  . 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 [place]     FAMILY HISTORY: No history of any blood cancers History reviewed. No pertinent family history.  ALLERGIES:  has No Known Allergies.  MEDICATIONS:  Current Outpatient Medications  Medication Sig Dispense Refill  . albuterol (ACCUNEB) 0.63 MG/3ML nebulizer solution TAKE 3  MLS (1 VIAL) BY NEBULIZATION EVERY 6 HOURS AS NEEDED FOR WHEEZING    . albuterol (VENTOLIN HFA) 108 (90 Base) MCG/ACT inhaler Inhale 2 puffs into the lungs every 6 (six) hours as needed for wheezing.    . Enalapril-hydroCHLOROthiazide 5-12.5 MG tablet TAKE 1 TABLET BY MOUTH ONCE DAILY FOR 90 DAYS    . rosuvastatin (CRESTOR) 10 MG tablet Take 10 mg by mouth daily.     No current facility-administered medications for this visit.       Marland Kitchen  PHYSICAL EXAMINATION: ECOG PERFORMANCE STATUS: 0 - Asymptomatic  Vitals:   11/28/18 1056  Pulse: 89  Resp: 18  Temp: (!) 97.2 F (36.2 C)  SpO2: 96%   Filed Weights   11/28/18 1056  Weight: 269 lb (122 kg)    Physical Exam  Constitutional: He is oriented to person, place, and time and well-developed, well-nourished, and in no distress.  He is alone.   HENT:  Head: Normocephalic and atraumatic.  Mouth/Throat: Oropharynx is clear and moist. No oropharyngeal exudate.  Eyes: Pupils are equal, round, and reactive to light.  Neck: Normal range of motion. Neck supple.  Cardiovascular: Normal rate and regular rhythm.  Pulmonary/Chest: Breath sounds normal. No respiratory distress. He has no wheezes.  Abdominal: Soft. Bowel sounds are normal. He exhibits no distension and no mass. There is no abdominal tenderness. There is no rebound and no guarding.  Musculoskeletal: Normal range of motion.        General: No tenderness or edema.  Neurological: He is alert and oriented to person, place, and time.  Skin: Skin is warm.  Psychiatric: Affect normal.   LABORATORY DATA:  I have reviewed the data as listed Lab Results  Component Value Date   WBC 9.0 01/10/2018   HGB 17.0 11/28/2018   HCT 51.5 11/28/2018   MCV 90.8 01/10/2018   PLT 194 01/10/2018   No results for input(s): NA, K, CL, CO2, GLUCOSE, BUN, CREATININE, CALCIUM, GFRNONAA, GFRAA, PROT, ALBUMIN, AST, ALT, ALKPHOS, BILITOT, BILIDIR, IBILI in the last 8760 hours.  RADIOGRAPHIC STUDIES: I  have personally reviewed the radiological images as listed and agreed with the findings in the report. No results found.  ASSESSMENT & PLAN:   Erythrocytosis # Erythrocytosis isolated-suspect secondary/question OSA; however patient  feels significantly improved post phlebotomy.  Today hematocrit is 51.  Proceed with phlebotomy every 2 months/patient's preference.   #History of proteinuria/frothy urine-chronic unclear etiology.  Recommend/defer further work-up including nephrology evaluation to PCP.  Ordered hemoglobin A1c as per patient's/PCPs preference.   # DISPOSITION:  # Phlebotomy today #  2 months- H&H/ possible phlebotomy # 4 months- H&H// possible phlebotomy # follow up in 6 months-MD- H&H/possible phlebotomy- Dr.B  Dr.L/Jaosn whitaker, NP  All questions were answered. The patient knows to call the clinic with any problems, questions or concerns.     Cammie Sickle, MD 11/28/2018 12:30 PM

## 2019-01-15 DIAGNOSIS — R809 Proteinuria, unspecified: Secondary | ICD-10-CM | POA: Insufficient documentation

## 2019-01-15 DIAGNOSIS — E785 Hyperlipidemia, unspecified: Secondary | ICD-10-CM | POA: Insufficient documentation

## 2019-01-15 DIAGNOSIS — N1831 Chronic kidney disease, stage 3a: Secondary | ICD-10-CM | POA: Insufficient documentation

## 2019-01-20 ENCOUNTER — Other Ambulatory Visit: Payer: Self-pay | Admitting: Nephrology

## 2019-01-20 DIAGNOSIS — R808 Other proteinuria: Secondary | ICD-10-CM

## 2019-01-20 DIAGNOSIS — N1831 Chronic kidney disease, stage 3a: Secondary | ICD-10-CM

## 2019-01-30 ENCOUNTER — Inpatient Hospital Stay: Payer: 59

## 2019-01-30 ENCOUNTER — Other Ambulatory Visit: Payer: Self-pay

## 2019-01-30 ENCOUNTER — Ambulatory Visit
Admission: RE | Admit: 2019-01-30 | Discharge: 2019-01-30 | Disposition: A | Discharge status: Home / Self Care | Payer: 59 | Source: Ambulatory Visit | Attending: Nephrology | Admitting: Nephrology

## 2019-01-30 ENCOUNTER — Inpatient Hospital Stay: Payer: 59 | Attending: Oncology

## 2019-01-30 DIAGNOSIS — N2881 Hypertrophy of kidney: Secondary | ICD-10-CM | POA: Diagnosis not present

## 2019-01-30 DIAGNOSIS — R808 Other proteinuria: Secondary | ICD-10-CM | POA: Diagnosis present

## 2019-01-30 DIAGNOSIS — N200 Calculus of kidney: Secondary | ICD-10-CM | POA: Diagnosis not present

## 2019-01-30 DIAGNOSIS — Z905 Acquired absence of kidney: Secondary | ICD-10-CM | POA: Diagnosis not present

## 2019-01-30 DIAGNOSIS — N1831 Chronic kidney disease, stage 3a: Secondary | ICD-10-CM | POA: Insufficient documentation

## 2019-01-30 DIAGNOSIS — D751 Secondary polycythemia: Secondary | ICD-10-CM

## 2019-01-30 LAB — HEMATOCRIT: HCT: 48.9 % (ref 39.0–52.0)

## 2019-01-30 LAB — HEMOGLOBIN: Hemoglobin: 16.2 g/dL (ref 13.0–17.0)

## 2019-04-03 ENCOUNTER — Inpatient Hospital Stay: Payer: Managed Care, Other (non HMO)

## 2019-04-03 ENCOUNTER — Inpatient Hospital Stay: Payer: Managed Care, Other (non HMO) | Attending: Internal Medicine

## 2019-04-09 ENCOUNTER — Telehealth: Payer: Self-pay | Admitting: Internal Medicine

## 2019-04-09 NOTE — Telephone Encounter (Signed)
Tried calling 2x the pt back to resch his appt he missed on 1/22 left message to call us back to resch his lab/phlebotomy appt

## 2019-04-16 ENCOUNTER — Other Ambulatory Visit: Payer: Self-pay

## 2019-04-17 ENCOUNTER — Inpatient Hospital Stay: Payer: Managed Care, Other (non HMO)

## 2019-04-17 ENCOUNTER — Other Ambulatory Visit: Payer: Self-pay

## 2019-04-17 ENCOUNTER — Inpatient Hospital Stay: Payer: Managed Care, Other (non HMO) | Attending: Internal Medicine

## 2019-04-17 DIAGNOSIS — D751 Secondary polycythemia: Secondary | ICD-10-CM | POA: Insufficient documentation

## 2019-04-17 LAB — HEMATOCRIT: HCT: 54 % — ABNORMAL HIGH (ref 39.0–52.0)

## 2019-04-17 LAB — HEMOGLOBIN: Hemoglobin: 17.6 g/dL — ABNORMAL HIGH (ref 13.0–17.0)

## 2019-05-28 ENCOUNTER — Inpatient Hospital Stay: Payer: 59

## 2019-05-28 ENCOUNTER — Inpatient Hospital Stay: Payer: 59 | Admitting: Internal Medicine

## 2019-06-04 ENCOUNTER — Other Ambulatory Visit: Payer: Self-pay

## 2019-06-05 ENCOUNTER — Inpatient Hospital Stay: Payer: 59 | Attending: Internal Medicine

## 2019-06-05 ENCOUNTER — Inpatient Hospital Stay (HOSPITAL_BASED_OUTPATIENT_CLINIC_OR_DEPARTMENT_OTHER): Payer: 59 | Admitting: Internal Medicine

## 2019-06-05 ENCOUNTER — Inpatient Hospital Stay: Payer: 59

## 2019-06-05 VITALS — BP 112/70 | HR 111 | Resp 20

## 2019-06-05 DIAGNOSIS — R0602 Shortness of breath: Secondary | ICD-10-CM | POA: Insufficient documentation

## 2019-06-05 DIAGNOSIS — Z79899 Other long term (current) drug therapy: Secondary | ICD-10-CM | POA: Insufficient documentation

## 2019-06-05 DIAGNOSIS — Z905 Acquired absence of kidney: Secondary | ICD-10-CM | POA: Insufficient documentation

## 2019-06-05 DIAGNOSIS — D751 Secondary polycythemia: Secondary | ICD-10-CM

## 2019-06-05 DIAGNOSIS — R5383 Other fatigue: Secondary | ICD-10-CM | POA: Insufficient documentation

## 2019-06-05 DIAGNOSIS — I499 Cardiac arrhythmia, unspecified: Secondary | ICD-10-CM | POA: Diagnosis not present

## 2019-06-05 DIAGNOSIS — F17210 Nicotine dependence, cigarettes, uncomplicated: Secondary | ICD-10-CM | POA: Insufficient documentation

## 2019-06-05 LAB — HEMATOCRIT: HCT: 53.2 % — ABNORMAL HIGH (ref 39.0–52.0)

## 2019-06-05 LAB — HEMOGLOBIN: Hemoglobin: 17.4 g/dL — ABNORMAL HIGH (ref 13.0–17.0)

## 2019-06-05 NOTE — Progress Notes (Signed)
[facility] NOTE  Patient Care Team: [name], PA-C as PCP - General (Family Medicine)  CHIEF COMPLAINTS/PURPOSE OF CONSULTATION: ERYTHROCYOTSOS  #Erythrocytosis [SEP 2019- PCP- HCT-56/hb- 86; N-wbc/platelets]; JAK- 2 NEG; Erythropoietin-Normal.   # solitary left kidney [traumatic injury at 35 y]; Smoker- Oct 2019- QUIT; obese/ ? OSA' ? Proteinuria  Oncology History   No history exists.   HISTORY OF PRESENTING ILLNESS:  Tracy Gardner 54 y.o.  male is here for follow-up of erythrocytosis.   Patient states that he has significant improvement of his symptoms of fatigue post phlebotomy every 2 months.  His shortness of breath also improved.  Of note he was recently noted to have elevated heart rate on his apple watch.  Denies any chest pain.  No palpitations.  States he had a recent EKG with his kidney doctor.  EKG is not available for my review.  Review of Systems  Constitutional: Positive for malaise/fatigue. Negative for chills, diaphoresis, fever and weight loss.  HENT: Negative for nosebleeds and sore throat.   Eyes: Negative for double vision.  Respiratory: Negative for cough, hemoptysis, sputum production, shortness of breath and wheezing.   Cardiovascular: Negative for chest pain, palpitations, orthopnea and leg swelling.  Gastrointestinal: Negative for abdominal pain, blood in stool, constipation, diarrhea, heartburn, melena, nausea and vomiting.  Genitourinary: Negative for dysuria, frequency and urgency.  Musculoskeletal: Negative for back pain and joint pain.  Skin: Negative.  Negative for itching and rash.  Neurological: Negative for dizziness, tingling, focal weakness, weakness and headaches.  Endo/Heme/Allergies: Does not bruise/bleed easily.  Psychiatric/Behavioral: Negative for depression. The patient is not nervous/anxious and does not have insomnia.      MEDICAL HISTORY:  Past Medical History:  Diagnosis Date  . Asthma,  persistent not controlled   . History of nephrectomy, unilateral   . Polycythemia     SURGICAL HISTORY: Past Surgical History:  Procedure Laterality Date  . kidney removal  Right   . NEPHRECTOMY Right     SOCIAL HISTORY:  Social History   Socioeconomic History  . Marital status: Married    Spouse name: Not on file  . Number of children: Not on file  . Years of education: Not on file  . Highest education level: Not on file  Occupational History  . Not on file  Tobacco Use  . Smoking status: Current Every Day Smoker    Packs/day: 1.00    Types: Cigarettes  . Smokeless tobacco: Never Used  Substance and Sexual Activity  . Alcohol use: Yes  . Drug use: Not on file  . Sexual activity: Not on file  Other Topics Concern  . 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 [place]    Social Determinants of Health   Financial Resource Strain:   . Difficulty of Paying Living Expenses:   Food Insecurity:   . Worried About Charity fundraiser in the Last Year:   . Arboriculturist in the Last Year:   Transportation Needs:   . Film/video editor (Medical):   Marland Kitchen Lack of Transportation (Non-Medical):   Physical Activity:   . Days of Exercise per Week:   . Minutes of Exercise per Session:   Stress:   . Feeling of Stress :   Social Connections:   . Frequency of Communication with Friends and Family:   . Frequency of Social Gatherings with  Friends and Family:   . Attends Religious Services:   . Active Member of Clubs or Organizations:   . Attends Archivist Meetings:   Marland Kitchen Marital Status:   Intimate Partner Violence:   . Fear of Current or Ex-Partner:   . Emotionally Abused:   Marland Kitchen Physically Abused:   . Sexually Abused:     FAMILY HISTORY: No history of any blood cancers No family history on file.  ALLERGIES:  has No Known Allergies.  MEDICATIONS:  Current Outpatient  Medications  Medication Sig Dispense Refill  . albuterol (ACCUNEB) 0.63 MG/3ML nebulizer solution TAKE 3 MLS (1 VIAL) BY NEBULIZATION EVERY 6 HOURS AS NEEDED FOR WHEEZING    . Enalapril-hydroCHLOROthiazide 5-12.5 MG tablet TAKE 1 TABLET BY MOUTH ONCE DAILY FOR 90 DAYS    . rosuvastatin (CRESTOR) 10 MG tablet Take 10 mg by mouth daily.    Marland Kitchen albuterol (VENTOLIN HFA) 108 (90 Base) MCG/ACT inhaler Inhale 2 puffs into the lungs every 6 (six) hours as needed for wheezing.     No current facility-administered medications for this visit.      Marland Kitchen  PHYSICAL EXAMINATION: ECOG PERFORMANCE STATUS: 0 - Asymptomatic  Vitals:   06/05/19 1358  BP: 102/75  Pulse: 80  Temp: (!) 97.5 F (36.4 C)   Filed Weights   06/05/19 1358  Weight: 265 lb (120.2 kg)    Physical Exam  Constitutional: He is oriented to person, place, and time and well-developed, well-nourished, and in no distress.  He is alone.   HENT:  Head: Normocephalic and atraumatic.  Mouth/Throat: Oropharynx is clear and moist. No oropharyngeal exudate.  Eyes: Pupils are equal, round, and reactive to light.  Cardiovascular: Normal rate and regular rhythm.  Pulmonary/Chest: Breath sounds normal. No respiratory distress. He has no wheezes.  Abdominal: Soft. Bowel sounds are normal. He exhibits no distension and no mass. There is no abdominal tenderness. There is no rebound and no guarding.  Musculoskeletal:        General: No tenderness or edema. Normal range of motion.     Cervical back: Normal range of motion and neck supple.  Neurological: He is alert and oriented to person, place, and time.  Skin: Skin is warm.  Psychiatric: Affect normal.   LABORATORY DATA:  I have reviewed the data as listed Lab Results  Component Value Date   WBC 9.0 01/10/2018   HGB 17.4 (H) 06/05/2019   HCT 53.2 (H) 06/05/2019   MCV 90.8 01/10/2018   PLT 194 01/10/2018   No results for input(s): NA, K, CL, CO2, GLUCOSE, BUN, CREATININE, CALCIUM,  GFRNONAA, GFRAA, PROT, ALBUMIN, AST, ALT, ALKPHOS, BILITOT, BILIDIR, IBILI in the last 8760 hours.  RADIOGRAPHIC STUDIES: I have personally reviewed the radiological images as listed and agreed with the findings in the report. No results found.  ASSESSMENT & PLAN:   Erythrocytosis # Erythrocytosis isolated-suspect secondary/question OSA; however patient  feels significantly improved post phlebotomy.  Today hematocrit is 53  Proceed with phlebotomy every 2 months/patient's preference.   #Irregular heart rhythm [Apple Watch 120/monitor clinic 88]-discrepant discussed regarding EKG.  States that he had a recent EKG. discussed that he needs to follow-up with his PCP ASAP/next week to discuss work-up treatment options.  Discussed the risk of stroke with A. Fib.   # DISPOSITION:  # Phlebotomy today #  2 months- H&H/ possible phlebotomy # 4 months- H&H// possible phlebotomy # follow up in 6 months-MD- H&H/possible phlebotomy- Dr.B  Dr.L/Jaosn whitaker, NP  All questions  were answered. The patient knows to call the clinic with any problems, questions or concerns.     Cammie Sickle, MD 06/05/2019 2:27 PM

## 2019-06-05 NOTE — Assessment & Plan Note (Addendum)
#   Erythrocytosis isolated-suspect secondary/question OSA; however patient  feels significantly improved post phlebotomy.  Today hematocrit is 53  Proceed with phlebotomy every 2 months/patient's preference.   #Irregular heart rhythm [Apple Watch 120/monitor clinic 88]-discrepant discussed regarding EKG.  States that he had a recent EKG. discussed that he needs to follow-up with his PCP ASAP/next week to discuss work-up treatment options.  Discussed the risk of stroke with A. Fib.   # DISPOSITION:  # Phlebotomy today #  2 months- H&H/ possible phlebotomy # 4 months- H&H// possible phlebotomy # follow up in 6 months-MD- H&H/possible phlebotomy- Dr.B  Dr.L/Jaosn whitaker, NP

## 2019-06-11 DIAGNOSIS — F411 Generalized anxiety disorder: Secondary | ICD-10-CM | POA: Insufficient documentation

## 2019-08-06 ENCOUNTER — Other Ambulatory Visit: Payer: Self-pay

## 2019-08-06 ENCOUNTER — Inpatient Hospital Stay: Payer: 59 | Attending: Internal Medicine

## 2019-08-06 ENCOUNTER — Inpatient Hospital Stay: Payer: 59

## 2019-08-06 VITALS — BP 99/78 | HR 64 | Temp 96.6°F | Resp 20

## 2019-08-06 DIAGNOSIS — D751 Secondary polycythemia: Secondary | ICD-10-CM

## 2019-08-06 LAB — HEMOGLOBIN: Hemoglobin: 17.7 g/dL — ABNORMAL HIGH (ref 13.0–17.0)

## 2019-08-06 LAB — HEMATOCRIT: HCT: 53.1 % — ABNORMAL HIGH (ref 39.0–52.0)

## 2019-08-07 ENCOUNTER — Inpatient Hospital Stay: Payer: 59

## 2019-10-09 ENCOUNTER — Other Ambulatory Visit: Payer: Self-pay

## 2019-10-09 ENCOUNTER — Inpatient Hospital Stay: Payer: 59 | Attending: Internal Medicine

## 2019-10-09 ENCOUNTER — Inpatient Hospital Stay: Payer: 59

## 2019-10-09 DIAGNOSIS — D751 Secondary polycythemia: Secondary | ICD-10-CM

## 2019-10-09 LAB — HEMOGLOBIN: Hemoglobin: 18.1 g/dL — ABNORMAL HIGH (ref 13.0–17.0)

## 2019-10-09 LAB — HEMATOCRIT: HCT: 54.4 % — ABNORMAL HIGH (ref 39.0–52.0)

## 2019-12-04 ENCOUNTER — Inpatient Hospital Stay: Payer: 59 | Attending: Internal Medicine

## 2019-12-04 ENCOUNTER — Other Ambulatory Visit: Payer: Self-pay

## 2019-12-04 ENCOUNTER — Inpatient Hospital Stay: Payer: 59

## 2019-12-04 ENCOUNTER — Other Ambulatory Visit: Payer: Self-pay | Admitting: *Deleted

## 2019-12-04 ENCOUNTER — Encounter: Payer: Self-pay | Admitting: Internal Medicine

## 2019-12-04 ENCOUNTER — Inpatient Hospital Stay: Payer: 59 | Admitting: Internal Medicine

## 2019-12-04 DIAGNOSIS — R5383 Other fatigue: Secondary | ICD-10-CM | POA: Diagnosis not present

## 2019-12-04 DIAGNOSIS — Z905 Acquired absence of kidney: Secondary | ICD-10-CM | POA: Diagnosis not present

## 2019-12-04 DIAGNOSIS — J45909 Unspecified asthma, uncomplicated: Secondary | ICD-10-CM | POA: Diagnosis not present

## 2019-12-04 DIAGNOSIS — Z79899 Other long term (current) drug therapy: Secondary | ICD-10-CM | POA: Diagnosis not present

## 2019-12-04 DIAGNOSIS — D751 Secondary polycythemia: Secondary | ICD-10-CM

## 2019-12-04 DIAGNOSIS — R519 Headache, unspecified: Secondary | ICD-10-CM | POA: Diagnosis not present

## 2019-12-04 DIAGNOSIS — F17210 Nicotine dependence, cigarettes, uncomplicated: Secondary | ICD-10-CM | POA: Diagnosis not present

## 2019-12-04 LAB — HEMATOCRIT: HCT: 52.5 % — ABNORMAL HIGH (ref 39.0–52.0)

## 2019-12-04 LAB — HEMOGLOBIN: Hemoglobin: 17.6 g/dL — ABNORMAL HIGH (ref 13.0–17.0)

## 2019-12-04 NOTE — Assessment & Plan Note (Addendum)
#   Erythrocytosis isolated-suspect secondary/question OSA; however patient  feels significantly improved post phlebotomy.  Today hematocrit is 52  Proceed with phlebotomy every 2 months/patient's preference. STABLE.   # Discrepant BP- repeat BP/manual- 102/64; STABLE.    # DISPOSITION:  # Phlebotomy today #  2 months- H&H/ possible phlebotomy # 4 months- H&H// possible phlebotomy # follow up in 6 months-MD- H&H/possible phlebotomy- Dr.B  Dr.L/Jaosn whitaker, NP

## 2019-12-04 NOTE — Progress Notes (Signed)
[facility] NOTE  Patient Care Team: [name], PA-C as PCP - General (Family Medicine) Cammie Sickle, MD as Consulting Physician (Hematology and Oncology)  CHIEF COMPLAINTS/PURPOSE OF CONSULTATION: ERYTHROCYOTSOS  #Erythrocytosis [SEP 2019- PCP- HCT-56/hb- 81; N-wbc/platelets]; JAK- 2 NEG; Erythropoietin-Normal.   # solitary left kidney [traumatic injury at 12 y]; Smoker- Oct 2019- QUIT; obese/ ? OSA' ? Proteinuria  Oncology History   No history exists.   HISTORY OF PRESENTING ILLNESS:  Tracy Gardner 54 y.o.  male is here for follow-up of erythrocytosis.   Patient notes to have improvement of his fatigue/headaches were getting phlebotomies every 2 months.  Shortness of breath is also improved.   Review of Systems  Constitutional: Positive for malaise/fatigue. Negative for chills, diaphoresis, fever and weight loss.  HENT: Negative for nosebleeds and sore throat.   Eyes: Negative for double vision.  Respiratory: Negative for cough, hemoptysis, sputum production, shortness of breath and wheezing.   Cardiovascular: Negative for chest pain, palpitations, orthopnea and leg swelling.  Gastrointestinal: Negative for abdominal pain, blood in stool, constipation, diarrhea, heartburn, melena, nausea and vomiting.  Genitourinary: Negative for dysuria, frequency and urgency.  Musculoskeletal: Negative for back pain and joint pain.  Skin: Negative.  Negative for itching and rash.  Neurological: Negative for dizziness, tingling, focal weakness, weakness and headaches.  Endo/Heme/Allergies: Does not bruise/bleed easily.  Psychiatric/Behavioral: Negative for depression. The patient is not nervous/anxious and does not have insomnia.      MEDICAL HISTORY:  Past Medical History:  Diagnosis Date  . Asthma, persistent not controlled   . History of nephrectomy, unilateral   . Polycythemia     SURGICAL HISTORY: Past Surgical History:  Procedure  Laterality Date  . kidney removal  Right   . NEPHRECTOMY Right     SOCIAL HISTORY:  Social History   Socioeconomic History  . Marital status: Married    Spouse name: Not on file  . Number of children: Not on file  . Years of education: Not on file  . Highest education level: Not on file  Occupational History  . Not on file  Tobacco Use  . Smoking status: Current Every Day Smoker    Packs/day: 1.00    Types: Cigarettes  . Smokeless tobacco: Never Used  Vaping Use  . Vaping Use: Never used  Substance and Sexual Activity  . Alcohol use: Yes  . Drug use: Not on file  . Sexual activity: Not on file  Other Topics Concern  . 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 [place]    Social Determinants of Health   Financial Resource Strain:   . Difficulty of Paying Living Expenses: Not on file  Food Insecurity:   . Worried About Charity fundraiser in the Last Year: Not on file  . Ran Out of Food in the Last Year: Not on file  Transportation Needs:   . Lack of Transportation (Medical): Not on file  . Lack of Transportation (Non-Medical): Not on file  Physical Activity:   . Days of Exercise per Week: Not on file  . Minutes of Exercise per Session: Not on file  Stress:   . Feeling of Stress : Not on file  Social Connections:   . Frequency of Communication with Friends and Family: Not on file  . Frequency of Social Gatherings with Friends and Family: Not on file  .  Attends Religious Services: Not on file  . Active Member of Clubs or Organizations: Not on file  . Attends Archivist Meetings: Not on file  . Marital Status: Not on file  Intimate Partner Violence:   . Fear of Current or Ex-Partner: Not on file  . Emotionally Abused: Not on file  . Physically Abused: Not on file  . Sexually Abused: Not on file    FAMILY HISTORY: No history of any blood cancers No family history  on file.  ALLERGIES:  has No Known Allergies.  MEDICATIONS:  Current Outpatient Medications  Medication Sig Dispense Refill  . albuterol (ACCUNEB) 0.63 MG/3ML nebulizer solution TAKE 3 MLS (1 VIAL) BY NEBULIZATION EVERY 6 HOURS AS NEEDED FOR WHEEZING    . Enalapril-hydroCHLOROthiazide 5-12.5 MG tablet TAKE 1 TABLET BY MOUTH ONCE DAILY FOR 90 DAYS    . rosuvastatin (CRESTOR) 10 MG tablet Take 10 mg by mouth daily.    Marland Kitchen albuterol (VENTOLIN HFA) 108 (90 Base) MCG/ACT inhaler Inhale 2 puffs into the lungs every 6 (six) hours as needed for wheezing.     No current facility-administered medications for this visit.      Marland Kitchen  PHYSICAL EXAMINATION: ECOG PERFORMANCE STATUS: 0 - Asymptomatic  Vitals:   12/04/19 1315  BP: 100/62  Pulse: 60  Resp: 18  Temp: (!) 97.3 F (36.3 C)  SpO2: 97%   Filed Weights   12/04/19 1315  Weight: 264 lb 9.6 oz (120 kg)    Physical Exam Constitutional:      Comments: He is alone.   HENT:     Head: Normocephalic and atraumatic.     Mouth/Throat:     Pharynx: No oropharyngeal exudate.  Eyes:     Pupils: Pupils are equal, round, and reactive to light.  Cardiovascular:     Rate and Rhythm: Normal rate and regular rhythm.  Pulmonary:     Effort: No respiratory distress.     Breath sounds: Normal breath sounds. No wheezing.  Abdominal:     General: Bowel sounds are normal. There is no distension.     Palpations: Abdomen is soft. There is no mass.     Tenderness: There is no abdominal tenderness. There is no guarding or rebound.  Musculoskeletal:        General: No tenderness. Normal range of motion.     Cervical back: Normal range of motion and neck supple.  Skin:    General: Skin is warm.  Neurological:     Mental Status: He is alert and oriented to person, place, and time.  Psychiatric:        Mood and Affect: Affect normal.    LABORATORY DATA:  I have reviewed the data as listed Lab Results  Component Value Date   WBC 9.0 01/10/2018    HGB 17.6 (H) 12/04/2019   HCT 52.5 (H) 12/04/2019   MCV 90.8 01/10/2018   PLT 194 01/10/2018   No results for input(s): NA, K, CL, CO2, GLUCOSE, BUN, CREATININE, CALCIUM, GFRNONAA, GFRAA, PROT, ALBUMIN, AST, ALT, ALKPHOS, BILITOT, BILIDIR, IBILI in the last 8760 hours.  RADIOGRAPHIC STUDIES: I have personally reviewed the radiological images as listed and agreed with the findings in the report. No results found.  ASSESSMENT & PLAN:   Erythrocytosis # Erythrocytosis isolated-suspect secondary/question OSA; however patient  feels significantly improved post phlebotomy.  Today hematocrit is 52  Proceed with phlebotomy every 2 months/patient's preference. STABLE.   # Discrepant BP- repeat BP/manual- 102/64; STABLE.    #  DISPOSITION:  # Phlebotomy today #  2 months- H&H/ possible phlebotomy # 4 months- H&H// possible phlebotomy # follow up in 6 months-MD- H&H/possible phlebotomy- Dr.B  Dr.L/Jaosn whitaker, NP  All questions were answered. The patient knows to call the clinic with any problems, questions or concerns.     Cammie Sickle, MD 12/07/2019 7:41 AM

## 2020-01-29 ENCOUNTER — Other Ambulatory Visit: Payer: Self-pay | Admitting: *Deleted

## 2020-01-29 ENCOUNTER — Other Ambulatory Visit: Payer: 59

## 2020-01-29 DIAGNOSIS — D751 Secondary polycythemia: Secondary | ICD-10-CM

## 2020-02-01 ENCOUNTER — Other Ambulatory Visit: Payer: Self-pay

## 2020-02-01 ENCOUNTER — Inpatient Hospital Stay: Payer: 59

## 2020-02-01 ENCOUNTER — Inpatient Hospital Stay: Payer: 59 | Attending: Internal Medicine

## 2020-02-01 VITALS — BP 99/69 | HR 64 | Temp 96.5°F | Resp 20

## 2020-02-01 DIAGNOSIS — D751 Secondary polycythemia: Secondary | ICD-10-CM | POA: Diagnosis not present

## 2020-02-01 LAB — HEMATOCRIT: HCT: 54 % — ABNORMAL HIGH (ref 39.0–52.0)

## 2020-02-01 LAB — HEMOGLOBIN: Hemoglobin: 18 g/dL — ABNORMAL HIGH (ref 13.0–17.0)

## 2020-03-28 ENCOUNTER — Telehealth: Payer: Self-pay | Admitting: Internal Medicine

## 2020-03-28 NOTE — Telephone Encounter (Signed)
Pt requested to cancel lab and phlebotomy appointment.  Will call back at the end of the week to reschedule.

## 2020-04-01 ENCOUNTER — Inpatient Hospital Stay: Payer: 59

## 2020-04-04 ENCOUNTER — Inpatient Hospital Stay: Payer: 59

## 2020-04-04 ENCOUNTER — Telehealth: Payer: Self-pay | Admitting: *Deleted

## 2020-04-04 ENCOUNTER — Other Ambulatory Visit: Payer: Self-pay | Admitting: *Deleted

## 2020-04-04 DIAGNOSIS — D751 Secondary polycythemia: Secondary | ICD-10-CM

## 2020-04-04 NOTE — Telephone Encounter (Signed)
Per MD return to clinic in 3 weeks from Covid testing for lab/phlebotomy. I asked pt when was test done for Covid, he replied today. Appt made for Feb 17 at 2:45 for lab/ 3p phlebotomy per pt request.

## 2020-04-04 NOTE — Telephone Encounter (Signed)
Duplicate-opened in error. 

## 2020-04-04 NOTE — Telephone Encounter (Signed)
Patient has been diagnosed with Covid he has phlebotomy scheduled for today at [facility]. He will not be in for that appointment. Please advise patient on his next step.

## 2020-04-28 ENCOUNTER — Inpatient Hospital Stay: Payer: 59

## 2020-04-28 ENCOUNTER — Inpatient Hospital Stay: Payer: 59 | Attending: Internal Medicine

## 2020-04-28 DIAGNOSIS — D751 Secondary polycythemia: Secondary | ICD-10-CM

## 2020-04-28 LAB — HEMOGLOBIN: Hemoglobin: 16.8 g/dL (ref 13.0–17.0)

## 2020-04-28 LAB — HEMATOCRIT: HCT: 51.1 % (ref 39.0–52.0)

## 2020-04-28 NOTE — Progress Notes (Signed)
Pt tolerated 350 ml therapeutic phlebotomy well. VSS. No complaints at time of discharge.

## 2020-04-28 NOTE — Patient Instructions (Signed)

## 2020-05-09 ENCOUNTER — Encounter: Payer: Self-pay | Admitting: Internal Medicine

## 2020-05-09 DIAGNOSIS — R7303 Prediabetes: Secondary | ICD-10-CM | POA: Diagnosis not present

## 2020-05-09 DIAGNOSIS — I10 Essential (primary) hypertension: Secondary | ICD-10-CM | POA: Diagnosis not present

## 2020-05-09 DIAGNOSIS — E785 Hyperlipidemia, unspecified: Secondary | ICD-10-CM | POA: Diagnosis not present

## 2020-05-09 DIAGNOSIS — Z0000 Encounter for general adult medical examination without abnormal findings: Secondary | ICD-10-CM | POA: Diagnosis not present

## 2020-05-09 DIAGNOSIS — N1831 Chronic kidney disease, stage 3a: Secondary | ICD-10-CM | POA: Diagnosis not present

## 2020-05-09 DIAGNOSIS — Z125 Encounter for screening for malignant neoplasm of prostate: Secondary | ICD-10-CM | POA: Diagnosis not present

## 2020-06-02 ENCOUNTER — Other Ambulatory Visit: Payer: Self-pay | Admitting: *Deleted

## 2020-06-02 DIAGNOSIS — D751 Secondary polycythemia: Secondary | ICD-10-CM

## 2020-06-03 ENCOUNTER — Inpatient Hospital Stay: Payer: 59 | Attending: Internal Medicine

## 2020-06-03 ENCOUNTER — Inpatient Hospital Stay: Payer: 59 | Admitting: Internal Medicine

## 2020-06-03 ENCOUNTER — Other Ambulatory Visit: Payer: Self-pay

## 2020-06-03 ENCOUNTER — Encounter: Payer: Self-pay | Admitting: Internal Medicine

## 2020-06-03 ENCOUNTER — Inpatient Hospital Stay: Payer: 59

## 2020-06-03 DIAGNOSIS — D751 Secondary polycythemia: Secondary | ICD-10-CM

## 2020-06-03 DIAGNOSIS — F17210 Nicotine dependence, cigarettes, uncomplicated: Secondary | ICD-10-CM | POA: Insufficient documentation

## 2020-06-03 DIAGNOSIS — R0602 Shortness of breath: Secondary | ICD-10-CM | POA: Insufficient documentation

## 2020-06-03 DIAGNOSIS — Z905 Acquired absence of kidney: Secondary | ICD-10-CM | POA: Insufficient documentation

## 2020-06-03 DIAGNOSIS — R5383 Other fatigue: Secondary | ICD-10-CM | POA: Diagnosis not present

## 2020-06-03 DIAGNOSIS — Z79899 Other long term (current) drug therapy: Secondary | ICD-10-CM | POA: Diagnosis not present

## 2020-06-03 DIAGNOSIS — R062 Wheezing: Secondary | ICD-10-CM | POA: Insufficient documentation

## 2020-06-03 LAB — HEMATOCRIT: HCT: 50 % (ref 39.0–52.0)

## 2020-06-03 LAB — HEMOGLOBIN: Hemoglobin: 16.2 g/dL (ref 13.0–17.0)

## 2020-06-03 NOTE — Assessment & Plan Note (Addendum)
#   Erythrocytosis isolated-suspect secondary/question OSA; however patient  feels significantly improved post phlebotomy.  [place] HCT-50. Proceed with phlebotomy every 2 months/patient's preference. STABLE.   # Wheezing: Not very symptomatic.  Recommend compliance with albuterol.   # DISPOSITION:  # HOLD Phlebotomy today #  2 months- H&H/ possible phlebotomy # 4 months- H&H// possible phlebotomy # follow up in 6 months-MD- H&H/possible phlebotomy- Dr.B

## 2020-06-03 NOTE — Progress Notes (Signed)
[place] NOTE  Patient Care Team: [name], PA-C as PCP - General (Family Medicine) Cammie Sickle, MD as Consulting Physician (Hematology and Oncology)  CHIEF COMPLAINTS/PURPOSE OF CONSULTATION: ERYTHROCYOTSOS  #Erythrocytosis [SEP 2019- PCP- HCT-56/hb- 73; N-wbc/platelets]; JAK- 2 NEG; Erythropoietin-Normal.   # solitary left kidney [traumatic injury at 12 y]; Smoker- Oct 2019- QUIT; obese/ ? OSA' ? Proteinuria  Oncology History   No history exists.   HISTORY OF PRESENTING ILLNESS:  Tracy Gardner 55 y.o.  male is here for follow-up of erythrocytosis.   Patient denies any worsening headaches or fatigue.  Notes improvement of his symptoms for phlebotomy.  Shortness of breath is improved.   Review of Systems  Constitutional: Positive for malaise/fatigue. Negative for chills, diaphoresis, fever and weight loss.  HENT: Negative for nosebleeds and sore throat.   Eyes: Negative for double vision.  Respiratory: Negative for cough, hemoptysis, sputum production, shortness of breath and wheezing.   Cardiovascular: Negative for chest pain, palpitations, orthopnea and leg swelling.  Gastrointestinal: Negative for abdominal pain, blood in stool, constipation, diarrhea, heartburn, melena, nausea and vomiting.  Genitourinary: Negative for dysuria, frequency and urgency.  Musculoskeletal: Negative for back pain and joint pain.  Skin: Negative.  Negative for itching and rash.  Neurological: Negative for dizziness, tingling, focal weakness, weakness and headaches.  Endo/Heme/Allergies: Does not bruise/bleed easily.  Psychiatric/Behavioral: Negative for depression. The patient is not nervous/anxious and does not have insomnia.      MEDICAL HISTORY:  Past Medical History:  Diagnosis Date  . Asthma, persistent not controlled   . History of nephrectomy, unilateral   . Polycythemia     SURGICAL HISTORY: Past Surgical History:  Procedure  Laterality Date  . kidney removal  Right   . NEPHRECTOMY Right     SOCIAL HISTORY:  Social History   Socioeconomic History  . Marital status: Married    Spouse name: Not on file  . Number of children: Not on file  . Years of education: Not on file  . Highest education level: Not on file  Occupational History  . Not on file  Tobacco Use  . Smoking status: Current Every Day Smoker    Packs/day: 1.00    Types: Cigarettes  . Smokeless tobacco: Never Used  Vaping Use  . Vaping Use: Never used  Substance and Sexual Activity  . Alcohol use: Yes  . Drug use: Not on file  . Sexual activity: Not on file  Other Topics Concern  . 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 [place].    Social Determinants of Health   Financial Resource Strain: Not on file  Food Insecurity: Not on file  Transportation Needs: Not on file  Physical Activity: Not on file  Stress: Not on file  Social Connections: Not on file  Intimate Partner Violence: Not on file    FAMILY HISTORY: No history of any blood cancers History reviewed. No pertinent family history.  ALLERGIES:  has No Known Allergies.  MEDICATIONS:  Current Outpatient Medications  Medication Sig Dispense Refill  . enalapril (VASOTEC) 2.5 MG tablet Take 2.5 mg by mouth daily.    . metFORMIN (GLUCOPHAGE) 500 MG tablet Take 1 tablet by mouth in the morning and at bedtime.    Marland Kitchen albuterol (ACCUNEB) 0.63 MG/3ML nebulizer solution TAKE 3 MLS (1 VIAL) BY NEBULIZATION EVERY 6 HOURS AS NEEDED FOR WHEEZING (Patient  not taking: Reported on 06/03/2020)    . albuterol (VENTOLIN HFA) 108 (90 Base) MCG/ACT inhaler Inhale 2 puffs into the lungs every 6 (six) hours as needed for wheezing. (Patient not taking: Reported on 06/03/2020)    . rosuvastatin (CRESTOR) 10 MG tablet Take 10 mg by mouth daily. (Patient not taking: Reported on 06/03/2020)     No current  facility-administered medications for this visit.      Marland Kitchen  PHYSICAL EXAMINATION: ECOG PERFORMANCE STATUS: 0 - Asymptomatic  Vitals:   06/03/20 1351  BP: 102/60  Pulse: (!) 57  Resp: 20  Temp: (!) 96.5 F (35.8 C)   Filed Weights   06/03/20 1351  Weight: 254 lb (115.2 kg)    Physical Exam Constitutional:      Comments: He is alone.   HENT:     Head: Normocephalic and atraumatic.     Mouth/Throat:     Pharynx: No oropharyngeal exudate.  Eyes:     Pupils: Pupils are equal, round, and reactive to light.  Cardiovascular:     Rate and Rhythm: Normal rate and regular rhythm.  Pulmonary:     Effort: No respiratory distress.     Breath sounds: Wheezing present.  Abdominal:     General: Bowel sounds are normal. There is no distension.     Palpations: Abdomen is soft. There is no mass.     Tenderness: There is no abdominal tenderness. There is no guarding or rebound.  Musculoskeletal:        General: No tenderness. Normal range of motion.     Cervical back: Normal range of motion and neck supple.  Skin:    General: Skin is warm.  Neurological:     Mental Status: He is alert and oriented to person, place, and time.  Psychiatric:        Mood and Affect: Affect normal.    LABORATORY DATA:  I have reviewed the data as listed Lab Results  Component Value Date   WBC 9.0 01/10/2018   HGB 16.2 06/03/2020   HCT 50.0 06/03/2020   MCV 90.8 01/10/2018   PLT 194 01/10/2018   No results for input(s): NA, K, CL, CO2, GLUCOSE, BUN, CREATININE, CALCIUM, GFRNONAA, GFRAA, PROT, ALBUMIN, AST, ALT, ALKPHOS, BILITOT, BILIDIR, IBILI in the last 8760 hours.  RADIOGRAPHIC STUDIES: I have personally reviewed the radiological images as listed and agreed with the findings in the report. No results found.  ASSESSMENT & PLAN:   Erythrocytosis # Erythrocytosis isolated-suspect secondary/question OSA; however patient  feels significantly improved post phlebotomy.  [place] HCT-50. Proceed with  phlebotomy every 2 months/patient's preference. STABLE.   # Wheezing: Not very symptomatic.  Recommend compliance with albuterol.   # DISPOSITION:  # HOLD Phlebotomy today #  2 months- H&H/ possible phlebotomy # 4 months- H&H// possible phlebotomy # follow up in 6 months-MD- H&H/possible phlebotomy- Dr.B  All questions were answered. The patient knows to call the clinic with any problems, questions or concerns.     Cammie Sickle, MD 06/03/2020 5:08 PM

## 2020-07-07 ENCOUNTER — Inpatient Hospital Stay
Admit: 2020-07-07 | Discharge: 2020-07-07 | Disposition: A | Discharge status: Home / Self Care | Payer: 59 | Attending: Internal Medicine | Admitting: Internal Medicine

## 2020-07-07 ENCOUNTER — Emergency Department: Payer: 59

## 2020-07-07 ENCOUNTER — Other Ambulatory Visit: Payer: Self-pay

## 2020-07-07 ENCOUNTER — Inpatient Hospital Stay
Admission: EM | Admit: 2020-07-07 | Discharge: 2020-07-12 | DRG: 308 | Disposition: A | Discharge status: Home / Self Care | Payer: 59 | Attending: Obstetrics and Gynecology | Admitting: Obstetrics and Gynecology

## 2020-07-07 DIAGNOSIS — J180 Bronchopneumonia, unspecified organism: Secondary | ICD-10-CM

## 2020-07-07 DIAGNOSIS — Z6835 Body mass index (BMI) 35.0-35.9, adult: Secondary | ICD-10-CM | POA: Diagnosis not present

## 2020-07-07 DIAGNOSIS — R0602 Shortness of breath: Secondary | ICD-10-CM | POA: Diagnosis not present

## 2020-07-07 DIAGNOSIS — N1831 Chronic kidney disease, stage 3a: Secondary | ICD-10-CM | POA: Diagnosis present

## 2020-07-07 DIAGNOSIS — E118 Type 2 diabetes mellitus with unspecified complications: Secondary | ICD-10-CM | POA: Diagnosis not present

## 2020-07-07 DIAGNOSIS — I480 Paroxysmal atrial fibrillation: Secondary | ICD-10-CM

## 2020-07-07 DIAGNOSIS — I429 Cardiomyopathy, unspecified: Secondary | ICD-10-CM | POA: Diagnosis not present

## 2020-07-07 DIAGNOSIS — J811 Chronic pulmonary edema: Secondary | ICD-10-CM | POA: Diagnosis not present

## 2020-07-07 DIAGNOSIS — N179 Acute kidney failure, unspecified: Secondary | ICD-10-CM | POA: Diagnosis present

## 2020-07-07 DIAGNOSIS — J4520 Mild intermittent asthma, uncomplicated: Secondary | ICD-10-CM | POA: Diagnosis not present

## 2020-07-07 DIAGNOSIS — J9601 Acute respiratory failure with hypoxia: Secondary | ICD-10-CM | POA: Diagnosis not present

## 2020-07-07 DIAGNOSIS — I420 Dilated cardiomyopathy: Secondary | ICD-10-CM

## 2020-07-07 DIAGNOSIS — Z79899 Other long term (current) drug therapy: Secondary | ICD-10-CM | POA: Diagnosis not present

## 2020-07-07 DIAGNOSIS — Z905 Acquired absence of kidney: Secondary | ICD-10-CM

## 2020-07-07 DIAGNOSIS — I4891 Unspecified atrial fibrillation: Secondary | ICD-10-CM

## 2020-07-07 DIAGNOSIS — A419 Sepsis, unspecified organism: Secondary | ICD-10-CM

## 2020-07-07 DIAGNOSIS — F17200 Nicotine dependence, unspecified, uncomplicated: Secondary | ICD-10-CM

## 2020-07-07 DIAGNOSIS — Z833 Family history of diabetes mellitus: Secondary | ICD-10-CM

## 2020-07-07 DIAGNOSIS — Z20822 Contact with and (suspected) exposure to covid-19: Secondary | ICD-10-CM | POA: Diagnosis present

## 2020-07-07 DIAGNOSIS — D751 Secondary polycythemia: Secondary | ICD-10-CM | POA: Diagnosis present

## 2020-07-07 DIAGNOSIS — I130 Hypertensive heart and chronic kidney disease with heart failure and stage 1 through stage 4 chronic kidney disease, or unspecified chronic kidney disease: Secondary | ICD-10-CM | POA: Diagnosis not present

## 2020-07-07 DIAGNOSIS — F17210 Nicotine dependence, cigarettes, uncomplicated: Secondary | ICD-10-CM | POA: Diagnosis present

## 2020-07-07 DIAGNOSIS — E785 Hyperlipidemia, unspecified: Secondary | ICD-10-CM | POA: Diagnosis present

## 2020-07-07 DIAGNOSIS — G4733 Obstructive sleep apnea (adult) (pediatric): Secondary | ICD-10-CM | POA: Diagnosis present

## 2020-07-07 DIAGNOSIS — J45901 Unspecified asthma with (acute) exacerbation: Secondary | ICD-10-CM | POA: Diagnosis present

## 2020-07-07 DIAGNOSIS — E1165 Type 2 diabetes mellitus with hyperglycemia: Secondary | ICD-10-CM | POA: Diagnosis present

## 2020-07-07 DIAGNOSIS — E1122 Type 2 diabetes mellitus with diabetic chronic kidney disease: Secondary | ICD-10-CM | POA: Diagnosis present

## 2020-07-07 DIAGNOSIS — Z86718 Personal history of other venous thrombosis and embolism: Secondary | ICD-10-CM

## 2020-07-07 DIAGNOSIS — I361 Nonrheumatic tricuspid (valve) insufficiency: Secondary | ICD-10-CM | POA: Diagnosis not present

## 2020-07-07 DIAGNOSIS — I499 Cardiac arrhythmia, unspecified: Secondary | ICD-10-CM | POA: Diagnosis not present

## 2020-07-07 DIAGNOSIS — J189 Pneumonia, unspecified organism: Secondary | ICD-10-CM

## 2020-07-07 DIAGNOSIS — J154 Pneumonia due to other streptococci: Secondary | ICD-10-CM | POA: Diagnosis not present

## 2020-07-07 DIAGNOSIS — I4819 Other persistent atrial fibrillation: Secondary | ICD-10-CM | POA: Diagnosis not present

## 2020-07-07 DIAGNOSIS — I5021 Acute systolic (congestive) heart failure: Secondary | ICD-10-CM | POA: Diagnosis not present

## 2020-07-07 DIAGNOSIS — I959 Hypotension, unspecified: Secondary | ICD-10-CM | POA: Diagnosis present

## 2020-07-07 DIAGNOSIS — Z7984 Long term (current) use of oral hypoglycemic drugs: Secondary | ICD-10-CM | POA: Diagnosis not present

## 2020-07-07 DIAGNOSIS — Z8249 Family history of ischemic heart disease and other diseases of the circulatory system: Secondary | ICD-10-CM

## 2020-07-07 DIAGNOSIS — I340 Nonrheumatic mitral (valve) insufficiency: Secondary | ICD-10-CM | POA: Diagnosis not present

## 2020-07-07 DIAGNOSIS — I517 Cardiomegaly: Secondary | ICD-10-CM | POA: Diagnosis not present

## 2020-07-07 DIAGNOSIS — I10 Essential (primary) hypertension: Secondary | ICD-10-CM | POA: Diagnosis not present

## 2020-07-07 HISTORY — DX: Other persistent atrial fibrillation: I48.19

## 2020-07-07 HISTORY — DX: Chronic kidney disease, stage 3 unspecified: N18.30

## 2020-07-07 HISTORY — DX: Hyperlipidemia, unspecified: E78.5

## 2020-07-07 HISTORY — DX: Cardiomyopathy, unspecified: I42.9

## 2020-07-07 HISTORY — DX: Morbid (severe) obesity due to excess calories: E66.01

## 2020-07-07 HISTORY — DX: Essential (primary) hypertension: I10

## 2020-07-07 HISTORY — DX: Type 2 diabetes mellitus without complications: E11.9

## 2020-07-07 HISTORY — DX: Tobacco use: Z72.0

## 2020-07-07 LAB — BASIC METABOLIC PANEL
Anion gap: 9 (ref 5–15)
BUN: 17 mg/dL (ref 6–20)
CO2: 26 mmol/L (ref 22–32)
Calcium: 8.8 mg/dL — ABNORMAL LOW (ref 8.9–10.3)
Chloride: 101 mmol/L (ref 98–111)
Creatinine, Ser: 1.08 mg/dL (ref 0.61–1.24)
GFR, Estimated: >60 mL/min (ref >60)
Glucose, Bld: 110 mg/dL — ABNORMAL HIGH (ref 70–99)
Potassium: 4.3 mmol/L (ref 3.5–5.1)
Sodium: 136 mmol/L (ref 135–145)

## 2020-07-07 LAB — HEPATIC FUNCTION PANEL
ALT: 19 U/L (ref 0–44)
AST: 18 U/L (ref 15–41)
Albumin: 3.5 g/dL (ref 3.5–5.0)
Alkaline Phosphatase: 65 U/L (ref 38–126)
Bilirubin, Direct: 0.2 mg/dL (ref 0.0–0.2)
Indirect Bilirubin: 1 mg/dL — ABNORMAL HIGH (ref 0.3–0.9)
Total Bilirubin: 1.2 mg/dL (ref 0.3–1.2)
Total Protein: 7.2 g/dL (ref 6.5–8.1)

## 2020-07-07 LAB — EXPECTORATED SPUTUM ASSESSMENT W GRAM STAIN, RFLX TO RESP C: Special Requests: NORMAL

## 2020-07-07 LAB — PROTIME-INR
INR: 1 (ref 0.8–1.2)
Prothrombin Time: 13 seconds (ref 11.4–15.2)

## 2020-07-07 LAB — HEPARIN LEVEL (UNFRACTIONATED): Heparin Unfractionated: 0.18 IU/mL — ABNORMAL LOW (ref 0.30–0.70)

## 2020-07-07 LAB — CBC
HCT: 53.4 % — ABNORMAL HIGH (ref 39.0–52.0)
Hemoglobin: 17 g/dL (ref 13.0–17.0)
MCH: 27.9 pg (ref 26.0–34.0)
MCHC: 31.8 g/dL (ref 30.0–36.0)
MCV: 87.5 fL (ref 80.0–100.0)
Platelets: 203 10*3/uL (ref 150–400)
RBC: 6.1 MIL/uL — ABNORMAL HIGH (ref 4.22–5.81)
RDW: 14.4 % (ref 11.5–15.5)
WBC: 11.2 10*3/uL — ABNORMAL HIGH (ref 4.0–10.5)
nRBC: 0 % (ref 0.0–0.2)

## 2020-07-07 LAB — TSH: TSH: 3.185 u[IU]/mL (ref 0.350–4.500)

## 2020-07-07 LAB — T4, FREE: Free T4: 1.15 ng/dL — ABNORMAL HIGH (ref 0.61–1.12)

## 2020-07-07 LAB — LACTIC ACID, PLASMA: Lactic Acid, Venous: 1.5 mmol/L (ref 0.5–1.9)

## 2020-07-07 LAB — HEMOGLOBIN A1C
Hgb A1c MFr Bld: 6.5 % — ABNORMAL HIGH (ref 4.8–5.6)
Mean Plasma Glucose: 139.85 mg/dL

## 2020-07-07 LAB — TROPONIN I (HIGH SENSITIVITY)
Troponin I (High Sensitivity): 17 ng/L (ref <18)
Troponin I (High Sensitivity): 16 ng/L (ref <18)

## 2020-07-07 LAB — PROCALCITONIN: Procalcitonin: <0.1 ng/mL

## 2020-07-07 LAB — ECHOCARDIOGRAM COMPLETE
AR max vel: 2.1 cm2
AV Area VTI: 2.64 cm2
AV Area mean vel: 2.05 cm2
AV Mean grad: 1.3 mmHg
AV Peak grad: 2.2 mmHg
Ao pk vel: 0.74 m/s
Area-P 1/2: 5.97 cm2
Calc EF: 35.2 %
Height: 71 in
S' Lateral: 4.75 cm
Single Plane A2C EF: 42 %
Single Plane A4C EF: 28.6 %
Weight: 4080 oz

## 2020-07-07 LAB — LIPID PANEL
Cholesterol: 174 mg/dL (ref 0–200)
HDL: 30 mg/dL — ABNORMAL LOW (ref >40)
LDL Cholesterol: 119 mg/dL — ABNORMAL HIGH (ref 0–99)
Total CHOL/HDL Ratio: 5.8 RATIO
Triglycerides: 127 mg/dL (ref <150)
VLDL: 25 mg/dL (ref 0–40)

## 2020-07-07 LAB — STREP PNEUMONIAE URINARY ANTIGEN: Strep Pneumo Urinary Antigen: NEGATIVE

## 2020-07-07 LAB — GLUCOSE, CAPILLARY
Glucose-Capillary: 126 mg/dL — ABNORMAL HIGH (ref 70–99)
Glucose-Capillary: 228 mg/dL — ABNORMAL HIGH (ref 70–99)
Glucose-Capillary: 195 mg/dL — ABNORMAL HIGH (ref 70–99)

## 2020-07-07 LAB — MAGNESIUM: Magnesium: 1.6 mg/dL — ABNORMAL LOW (ref 1.7–2.4)

## 2020-07-07 LAB — MRSA PCR SCREENING: MRSA by PCR: NEGATIVE

## 2020-07-07 LAB — RESP PANEL BY RT-PCR (FLU A&B, COVID) ARPGX2
Influenza A by PCR: NEGATIVE
Influenza B by PCR: NEGATIVE
SARS Coronavirus 2 by RT PCR: NEGATIVE

## 2020-07-07 LAB — CREATININE, SERUM
Creatinine, Ser: 1.13 mg/dL (ref 0.61–1.24)
GFR, Estimated: >60 mL/min (ref >60)

## 2020-07-07 LAB — APTT: aPTT: 31 seconds (ref 24–36)

## 2020-07-07 LAB — BRAIN NATRIURETIC PEPTIDE: B Natriuretic Peptide: 240.2 pg/mL — ABNORMAL HIGH (ref 0.0–100.0)

## 2020-07-07 MED ORDER — ENOXAPARIN SODIUM 60 MG/0.6ML IJ SOSY: 0.5000 mg/kg | PREFILLED_SYRINGE | INTRAMUSCULAR | Status: DC

## 2020-07-07 MED ORDER — ACETAMINOPHEN 325 MG PO TABS: 650.0000 mg | ORAL_TABLET | Freq: Four times a day (QID) | ORAL | Status: DC | PRN

## 2020-07-07 MED ORDER — APIXABAN 5 MG PO TABS: 5.0000 mg | ORAL_TABLET | Freq: Two times a day (BID) | ORAL | Status: DC

## 2020-07-07 MED ORDER — MELATONIN 3 MG PO TABS
3.0000 mg | ORAL_TABLET | Freq: Every evening | ORAL | Status: DC | PRN
  Filled 2020-07-07: qty 1

## 2020-07-07 MED ORDER — ONDANSETRON HCL 4 MG/2ML IJ SOLN: 4.0000 mg | Freq: Four times a day (QID) | INTRAMUSCULAR | Status: DC | PRN

## 2020-07-07 MED ORDER — DIGOXIN 0.25 MG/ML IJ SOLN
0.2500 mg | Freq: Once | INTRAMUSCULAR | Status: AC
  Administered 2020-07-07: 0.25 mg via INTRAVENOUS
  Filled 2020-07-07: qty 2

## 2020-07-07 MED ORDER — DILTIAZEM HCL 25 MG/5ML IV SOLN
10.0000 mg | Freq: Once | INTRAVENOUS | Status: AC
  Administered 2020-07-07: 10 mg via INTRAVENOUS
  Filled 2020-07-07: qty 5

## 2020-07-07 MED ORDER — SODIUM CHLORIDE 0.9 % IV BOLUS
500.0000 mL | Freq: Once | INTRAVENOUS | Status: AC
  Administered 2020-07-07: 500 mL via INTRAVENOUS

## 2020-07-07 MED ORDER — CHLORHEXIDINE GLUCONATE CLOTH 2 % EX PADS
6.0000 | MEDICATED_PAD | Freq: Every day | CUTANEOUS | Status: DC
  Administered 2020-07-07: 6 via TOPICAL

## 2020-07-07 MED ORDER — DILTIAZEM HCL-DEXTROSE 125-5 MG/125ML-% IV SOLN (PREMIX)
5.0000 mg/h | INTRAVENOUS | Status: DC
  Administered 2020-07-07: 5 mg/h via INTRAVENOUS
  Administered 2020-07-08: 7.5 mg/h via INTRAVENOUS
  Filled 2020-07-07 (×2): qty 125

## 2020-07-07 MED ORDER — NICOTINE 14 MG/24HR TD PT24
14.0000 mg | MEDICATED_PATCH | Freq: Every day | TRANSDERMAL | Status: DC
  Administered 2020-07-07 – 2020-07-12 (×6): 14 mg via TRANSDERMAL
  Filled 2020-07-07 (×7): qty 1

## 2020-07-07 MED ORDER — METHYLPREDNISOLONE SODIUM SUCC 125 MG IJ SOLR
125.0000 mg | Freq: Once | INTRAMUSCULAR | Status: AC
  Administered 2020-07-07: 125 mg via INTRAVENOUS
  Filled 2020-07-07: qty 2

## 2020-07-07 MED ORDER — METHYLPREDNISOLONE SODIUM SUCC 125 MG IJ SOLR
60.0000 mg | Freq: Every day | INTRAMUSCULAR | Status: DC
  Filled 2020-07-07: qty 2

## 2020-07-07 MED ORDER — INSULIN ASPART 100 UNIT/ML IJ SOLN
0.0000 [IU] | Freq: Three times a day (TID) | INTRAMUSCULAR | Status: DC
  Administered 2020-07-07: 3 [IU] via SUBCUTANEOUS
  Administered 2020-07-08 – 2020-07-09 (×3): 2 [IU] via SUBCUTANEOUS
  Administered 2020-07-09: 1 [IU] via SUBCUTANEOUS
  Administered 2020-07-10: 2 [IU] via SUBCUTANEOUS
  Administered 2020-07-10 – 2020-07-11 (×2): 1 [IU] via SUBCUTANEOUS
  Administered 2020-07-11: 3 [IU] via SUBCUTANEOUS
  Filled 2020-07-07 (×9): qty 1

## 2020-07-07 MED ORDER — HEPARIN (PORCINE) 25000 UT/250ML-% IV SOLN
1700.0000 [IU]/h | INTRAVENOUS | Status: AC
  Administered 2020-07-07: 1400 [IU]/h via INTRAVENOUS
  Administered 2020-07-08: 1700 [IU]/h via INTRAVENOUS
  Filled 2020-07-07 (×3): qty 250

## 2020-07-07 MED ORDER — ENOXAPARIN SODIUM 40 MG/0.4ML IJ SOSY: 40.0000 mg | PREFILLED_SYRINGE | INTRAMUSCULAR | Status: DC

## 2020-07-07 MED ORDER — SODIUM CHLORIDE 0.9 % IV BOLUS
1000.0000 mL | Freq: Once | INTRAVENOUS | Status: AC
  Administered 2020-07-07: 1000 mL via INTRAVENOUS

## 2020-07-07 MED ORDER — HEPARIN BOLUS VIA INFUSION
3000.0000 [IU] | Freq: Once | INTRAVENOUS | Status: AC
  Administered 2020-07-07: 3000 [IU] via INTRAVENOUS
  Filled 2020-07-07: qty 3000

## 2020-07-07 MED ORDER — HEPARIN BOLUS VIA INFUSION
5000.0000 [IU] | Freq: Once | INTRAVENOUS | Status: AC
  Administered 2020-07-07: 5000 [IU] via INTRAVENOUS
  Filled 2020-07-07: qty 5000

## 2020-07-07 MED ORDER — SODIUM CHLORIDE 0.9 % IV SOLN
500.0000 mg | INTRAVENOUS | Status: DC
  Administered 2020-07-07 – 2020-07-09 (×3): 500 mg via INTRAVENOUS
  Filled 2020-07-07 (×4): qty 500

## 2020-07-07 MED ORDER — IOHEXOL 350 MG/ML SOLN
75.0000 mL | Freq: Once | INTRAVENOUS | Status: AC | PRN
  Administered 2020-07-07: 75 mL via INTRAVENOUS

## 2020-07-07 MED ORDER — SODIUM CHLORIDE 0.9 % IV SOLN
2.0000 g | INTRAVENOUS | Status: DC
  Administered 2020-07-07 – 2020-07-09 (×3): 2 g via INTRAVENOUS
  Filled 2020-07-07: qty 20
  Filled 2020-07-07: qty 2
  Filled 2020-07-07: qty 20
  Filled 2020-07-07: qty 2

## 2020-07-07 MED ORDER — INSULIN ASPART 100 UNIT/ML IJ SOLN
0.0000 [IU] | Freq: Every day | INTRAMUSCULAR | Status: DC
  Administered 2020-07-09 – 2020-07-11 (×2): 2 [IU] via SUBCUTANEOUS
  Filled 2020-07-07 (×2): qty 1

## 2020-07-07 MED ORDER — IPRATROPIUM-ALBUTEROL 0.5-2.5 (3) MG/3ML IN SOLN
3.0000 mL | Freq: Four times a day (QID) | RESPIRATORY_TRACT | Status: DC
  Administered 2020-07-07 – 2020-07-08 (×5): 3 mL via RESPIRATORY_TRACT
  Filled 2020-07-07 (×6): qty 3

## 2020-07-07 NOTE — ED Notes (Signed)
Pt does not meet parameters to increase cardizem, MD notified, will continue to monitor. BP 100/79. HR 113

## 2020-07-07 NOTE — Progress Notes (Signed)
*  PRELIMINARY RESULTS* Echocardiogram 2D Echocardiogram has been performed.  Sherrie Sport 07/07/2020, 2:26 PM

## 2020-07-07 NOTE — Progress Notes (Signed)
PHARMACIST - PHYSICIAN COMMUNICATION  CONCERNING:  Enoxaparin (Lovenox) for DVT Prophylaxis    RECOMMENDATION: Patient was prescribed enoxaprin 40mg  q24 hours for VTE prophylaxis.   Filed Weights   07/07/20 1006  Weight: 115.7 kg (255 lb)    Body mass index is 35.57 kg/m.  Estimated Creatinine Clearance: 100 mL/min (by C-G formula based on SCr of 1.08 mg/dL).   Based on [place] patient is candidate for enoxaparin 0.5mg /kg TBW SQ every 24 hours based on BMI being >30.  DESCRIPTION: Pharmacy has adjusted enoxaparin dose per [name] policy.  Patient is now receiving enoxaparin 57.5 mg every 24 hours    Pernell Dupre, PharmD, BCPS Clinical Pharmacist 07/07/2020 12:30 PM

## 2020-07-07 NOTE — ED Notes (Signed)
ED Provider at bedside. 

## 2020-07-07 NOTE — Plan of Care (Signed)
  Problem: Education: Goal: Knowledge of General Education information will improve Description: Including pain rating scale, medication(s)/side effects and non-pharmacologic comfort measures Outcome: Progressing   Problem: Health Behavior/Discharge Planning: Goal: Ability to manage health-related needs will improve Outcome: Progressing   Problem: Clinical Measurements: Goal: Ability to maintain clinical measurements within normal limits will improve Outcome: Progressing Goal: Will remain free from infection Outcome: Progressing Goal: Respiratory complications will improve Outcome: Progressing Goal: Cardiovascular complication will be avoided Outcome: Progressing   Problem: Nutrition: Goal: Adequate nutrition will be maintained Outcome: Progressing   Problem: Coping: Goal: Level of anxiety will decrease Outcome: Progressing   Problem: Pain Managment: Goal: General experience of comfort will improve Outcome: Progressing   

## 2020-07-07 NOTE — ED Notes (Signed)
VO from Dr. Nevada Crane., do not increase cardizem at this time, pt to see cardiology.

## 2020-07-07 NOTE — Progress Notes (Signed)
Following for code sepsis 

## 2020-07-07 NOTE — ED Triage Notes (Signed)
Pt to [facility] for routine visit, and was c/o SOB, per pt Afib was found on EKG. Pt states he has not had Afib before that he knows of. Pt with  SOBx one week.
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
## 2020-07-07 NOTE — Consult Note (Signed)
ANTICOAGULATION CONSULT NOTE  Pharmacy Consult for Heparin Indication: atrial fibrillation  No Known Allergies  Patient Measurements: Height: 5\' 11"  (180.3 cm) Weight: 114.3 kg (251 lb 15.8 oz) IBW/kg (Calculated) : 75.3 Heparin Dosing Weight: 100.2 kg  Vital Signs: Temp: 98.7 F (37.1 C) (04/28 1341) Temp Source: Oral (04/28 1341) BP: 93/69 (04/28 1451) Pulse Rate: 109 (04/28 1451)  Labs: Recent Labs    07/07/20 1010 07/07/20 1226  HGB 17.0  --   HCT 53.4*  --   PLT 203  --   CREATININE 1.08 1.13  TROPONINIHS 17 16    Estimated Creatinine Clearance: 95 mL/min (by C-G formula based on SCr of 1.13 mg/dL).   Medical History: Past Medical History:  Diagnosis Date  . Asthma, persistent not controlled   . CKD (chronic kidney disease), stage III ([place])   . Essential hypertension   . History of nephrectomy, unilateral   . Hyperlipidemia   . Morbid obesity ([place])   . Polycythemia   . Tobacco abuse   . Type II diabetes mellitus ([facility])     Medications:  Medications Prior to Admission  Medication Sig Dispense Refill Last Dose  . albuterol (ACCUNEB) 0.63 MG/3ML nebulizer solution    07/07/2020 at 0800  . albuterol (VENTOLIN HFA) 108 (90 Base) MCG/ACT inhaler 2 puffs q.i.d. p.r.n. short of breath, wheezing, or cough   07/07/2020 at 0800  . Enalapril-hydroCHLOROthiazide 5-12.5 MG tablet Take 0.5 tablets by mouth daily.   07/07/2020 at 0800  . metFORMIN (GLUCOPHAGE) 500 MG tablet Take 1 tablet by mouth in the morning and at bedtime.   07/07/2020 at 0800  . albuterol (VENTOLIN HFA) 108 (90 Base) MCG/ACT inhaler Inhale 2 puffs into the lungs every 6 (six) hours as needed for wheezing. (Patient not taking: Reported on 06/03/2020)     . enalapril (VASOTEC) 2.5 MG tablet Take 2.5 mg by mouth daily. (Patient not taking: No sig reported)   Not Taking at Unknown time  . rosuvastatin (CRESTOR) 10 MG tablet Take 10 mg by mouth daily. (Patient not taking: No sig reported)      Scheduled:   . Chlorhexidine Gluconate Cloth  6 each Topical Daily  . digoxin  0.25 mg Intravenous Once  . insulin aspart  0-5 Units Subcutaneous QHS  . insulin aspart  0-9 Units Subcutaneous TID WC  . ipratropium-albuterol  3 mL Nebulization Q6H  . [START ON 07/08/2020] methylPREDNISolone (SOLU-MEDROL) injection  60 mg Intravenous Daily  . nicotine  14 mg Transdermal Daily   Infusions:  . azithromycin 500 mg (07/07/20 1332)  . cefTRIAXone (ROCEPHIN)  IV Stopped (07/07/20 1327)  . diltiazem (CARDIZEM) infusion 10 mg/hr (07/07/20 1233)   PRN: acetaminophen, melatonin, ondansetron (ZOFRAN) IV Anti-infectives (From admission, onward)   Start     Dose/Rate Route Frequency Ordered Stop   07/07/20 1200  cefTRIAXone (ROCEPHIN) 2 g in sodium chloride 0.9 % 100 mL IVPB        2 g 200 mL/hr over 30 Minutes Intravenous Every 24 hours 07/07/20 1146     07/07/20 1200  azithromycin (ZITHROMAX) 500 mg in sodium chloride 0.9 % 250 mL IVPB        500 mg 250 mL/hr over 60 Minutes Intravenous Every 24 hours 07/07/20 1146        Assessment: Pharmacy consulted to start heparin for new onset afib.   Goal of Therapy:  Heparin level 0.3-0.7 units/ml Monitor platelets by anticoagulation protocol: Yes   Plan:  Give 5000 units bolus x 1  Start heparin infusion at 1400 units/hr Check anti-Xa level in 6 hours and daily while on heparin Continue to monitor H&H and platelets  Oswald Hillock, PharmD, BCPS 07/07/2020,3:35 PM

## 2020-07-07 NOTE — H&P (Signed)
History and Physical  Tracy Gardner MVH:[id number] DOB: 1965/05/16 DOA: 07/07/2020  Referring physician: Dr. Jari Pigg, EDP  PCP: [name], PA-C  Outpatient Specialists: Oncology. Patient coming from: PCPs office through home.  Chief Complaint: Shortness of breath and productive cough x 1 week.  HPI: Tracy Gardner is a 55 y.o. male with medical history significant for asthma, tobacco use disorder, erythrocytosis, type 2 diabetes, essential hypertension, hyperlipidemia who presented to [facility] ED from his PCPs office after being advised to present to the ED due to new onset A. fib with RVR.  Patient reports for the last week he has had a productive cough with brownish, greenish sputum and shortness of breath with minimal exertion.  No chest pain.  No fever.  Thought they were allergies symptoms.  Ongoing tobacco use, half a pack a day.  His cough has been more productive than his usual chronic smoker's cough.  No lower extremity edema or tenderness.  He went to his primary care provider today for a regular checkup after recently started on metformin for type 2 diabetes.  While he was there vital signs were abnormal, his heart rate was in the 150s.  Twelve-lead EKG revealed A. fib with RVR.  Recommended to come to the ED for further evaluation.  A. fib was confirmed.  Hypoxic on exam requiring 2 to 3 L nasal cannula.  Due to concern for PE he had a CTA chest which was negative for PE but showed bronchial pneumonia.  Started on IV antibiotics empirically in the ED.  Also received a dose of IV Solu-Medrol 125 mg x 1 due to wheezing likely from asthma exacerbation.  EDP called for admission.  ED Course:  Afebrile.  BP 104/79, pulse 120, respiratory rate 32, O2 saturation 94% on 3 L.  Lab studies remarkable for BNP 240, troponin 17, normal.  WBC 11.2.  Twelve-lead EKG A. fib with rate of 148, nonspecific ST-T changes.  QTc 473.  Review of Systems: Review of systems as noted in the HPI. All other systems  reviewed and are negative.   Past Medical History:  Diagnosis Date  . Asthma, persistent not controlled   . History of nephrectomy, unilateral   . Polycythemia    Past Surgical History:  Procedure Laterality Date  . kidney removal  Right   . NEPHRECTOMY Right     Social History:  reports that he has been smoking cigarettes. He has been smoking about 1.00 pack per day. He has never used smokeless tobacco. He reports current alcohol use. No history on file for drug use.   No Known Allergies  Family history: Mother with history of A. fib.  Prior to Admission medications   Medication Sig Start Date End Date Taking? Authorizing Provider  albuterol (ACCUNEB) 0.63 MG/3ML nebulizer solution  11/03/18  Yes [provider]  albuterol (VENTOLIN HFA) 108 (90 Base) MCG/ACT inhaler 2 puffs q.i.d. p.r.n. short of breath, wheezing, or cough 07/07/20  Yes [provider]  Enalapril-hydroCHLOROthiazide 5-12.5 MG tablet Take 0.5 tablets by mouth daily. 06/16/20  Yes [provider]  metFORMIN (GLUCOPHAGE) 500 MG tablet Take 1 tablet by mouth in the morning and at bedtime. 05/09/20 05/09/21 Yes [provider]  albuterol (VENTOLIN HFA) 108 (90 Base) MCG/ACT inhaler Inhale 2 puffs into the lungs every 6 (six) hours as needed for wheezing. Patient not taking: Reported on 06/03/2020 08/21/14 11/28/18  [provider]  enalapril (VASOTEC) 2.5 MG tablet Take 2.5 mg by mouth daily. Patient  not taking: No sig reported 05/09/20   [provider]  rosuvastatin (CRESTOR) 10 MG tablet Take 10 mg by mouth daily. Patient not taking: No sig reported 07/10/18   [provider]    Physical Exam: BP 103/81   Pulse (!) 123   Temp 98.2 F (36.8 C) (Oral)   Resp (!) 26   Ht 5\' 11"  (1.803 m)   Wt 115.7 kg   SpO2 93%   BMI 35.57 kg/m   . General: 55 y.o. year-old male well developed well nourished in no acute distress.  Alert and oriented  x3. . Cardiovascular: Regular rate and rhythm with no rubs or gallops.  No thyromegaly or JVD noted.  No lower extremity edema. 2/4 pulses in all 4 extremities. Marland Kitchen Respiratory: Diffuse wheezing bilaterally. Good inspiratory effort. . Abdomen: Soft nontender nondistended with normal bowel sounds x4 quadrants. . Muskuloskeletal: No cyanosis, clubbing or edema noted bilaterally . Neuro: CN II-XII intact, strength, sensation, reflexes . Skin: No ulcerative lesions noted or rashes . Psychiatry: Judgement and insight appear normal. Mood is appropriate for condition and setting          Labs on Admission:  Basic Metabolic Panel: Recent Labs  Lab 07/07/20 1010  NA 136  K 4.3  CL 101  CO2 26  GLUCOSE 110*  BUN 17  CREATININE 1.08  CALCIUM 8.8*   Liver Function Tests: No results for input(s): AST, ALT, ALKPHOS, BILITOT, PROT, ALBUMIN in the last 168 hours. No results for input(s): LIPASE, AMYLASE in the last 168 hours. No results for input(s): AMMONIA in the last 168 hours. CBC: Recent Labs  Lab 07/07/20 1010  WBC 11.2*  HGB 17.0  HCT 53.4*  MCV 87.5  PLT 203   Cardiac Enzymes: No results for input(s): CKTOTAL, CKMB, CKMBINDEX, TROPONINI in the last 168 hours.  BNP (last 3 results) Recent Labs    07/07/20 1010  BNP 240.2*    ProBNP (last 3 results) No results for input(s): PROBNP in the last 8760 hours.  CBG: No results for input(s): GLUCAP in the last 168 hours.  Radiological Exams on Admission: CT Angio Chest PE W and/or Wo Contrast  Result Date: 07/07/2020 CLINICAL DATA:  Shortness of breath.  Atrial fibrillation. EXAM: CT ANGIOGRAPHY CHEST WITH CONTRAST TECHNIQUE: Multidetector CT imaging of the chest was performed using the standard protocol during bolus administration of intravenous contrast. Multiplanar CT image reconstructions and MIPs were obtained to evaluate the vascular anatomy. CONTRAST:  4mL OMNIPAQUE IOHEXOL 350 MG/ML SOLN COMPARISON:  Chest radiography  same day FINDINGS: Cardiovascular: Heart size upper limits of normal. Relatively mild coronary artery calcification seems to be present. Minimal aortic atherosclerotic calcification. Pulmonary arterial opacification is excellent. No pulmonary emboli. Mediastinum/Nodes: No mass or lymphadenopathy. Lungs/Pleura: No pulmonary edema or pleural effusion. The right lung is clear. There is mild hazy and patchy infiltrate in the left upper lobe and lingula consistent with bronchopneumonia. No dense consolidation, collapse or effusion. 4 mm subpleural nodule in the left lower lobe axial image 71. Upper Abdomen: Negative Musculoskeletal: Ordinary degenerative changes affect the thoracic spine. Chronic calcified disc herniation at T12-L1. Review of the MIP images confirms the above findings. IMPRESSION: 1. No pulmonary emboli. 2. Mild hazy and patchy infiltrate in the left upper lobe and lingula consistent with bronchopneumonia. No dense consolidation, collapse or effusion. 3. 4 mm subpleural nodule in the left lower lobe. This does not require further follow-up. 4. Mild coronary artery calcification and very minimal aortic atherosclerotic calcification. Electronically  Signed   By: Nelson Chimes M.D.   On: 07/07/2020 11:19   DG Chest Port 1 View  Result Date: 07/07/2020 CLINICAL DATA:  Shortness of breath.  Atrial fibrillation. EXAM: PORTABLE CHEST 1 VIEW COMPARISON:  01/10/2018. FINDINGS: Mild cardiac enlargement. Pulmonary vascular congestion identified. No pleural effusions. No airspace opacification. Visualized osseous structures are unremarkable. IMPRESSION: Cardiac enlargement and pulmonary vascular congestion. Electronically Signed   By: Kerby Moors M.D.   On: 07/07/2020 10:46    EKG: I independently viewed the EKG done and my findings are as followed: A. fib with rate of 148, nonspecific ST-T changes.  QTc 473.  Assessment/Plan Present on Admission: . New onset atrial fibrillation [facility])  Active  Problems:   New onset atrial fibrillation ([place])  New onset A. fib with RVR Presented to the ED as a recommendation from his PCP. Heart rate in the 150s in the ED, twelve-lead EKG revealed A. fib with RVR, started on diltiazem drip. CHA2DS2-VASc score 2, no history of head trauma or GI bleed. Start oral anticoagulation Eliquis, patient understands and agrees with plan. Continue to monitor on telemetry Obtain 2D echo, TSH, cardiology consult.  Acute hypoxic respiratory failure secondary to bronchial pneumonia and acute asthma exacerbation. Not on oxygen supplementation at baseline. Currently requiring 3 L to maintain a saturation greater than 90%. Continue IV steroids, bronchodilators, incentive spirometer. Mobilize as tolerated. Obtain home O2 evaluation in the next 24 to 48 hours. Wean off oxygen supplementation as tolerated.  Bronchial pneumonia, POA Presented with 1 week of dyspnea and productive cough. Obtain sputum culture Strep pneumonia and Legionella urine antigen Continue Rocephin and azithromycin started in the ED x5 days. Continue bronchodilators and pulmonary toilet Procalcitonin, CBC in the a.m.  Acute asthma exacerbation Patient has never been intubated Management per above, IV steroids, bronchodilators, mobilization Tobacco cessation counseling done at bedside.  Type 2 diabetes with hyperglycemia Recently started on metformin Update hemoglobin A1c Hold off oral hypoglycemics Start insulin sliding scale  Tobacco use disorder Reports he has cut down to half a pack a day from 1 pack a day since the age of 31. Patient has received tobacco cessation counseling at bedside Nicotine patch as needed.  Essential hypertension BP is currently at goal Hold off home oral antihypertensive while on Cardizem drip to avoid hypotension. Continue to closely monitor vital signs.  Hyperlipidemia Resume home regimen    DVT prophylaxis: Eliquis.  Code Status: Full code as  stated by the patient himself.  Family Communication: Wife at bedside.  All questions answered to the best of my ability.  Disposition Plan: Admit to stepdown unit.  Consults called: Cardiology.  Admission status: Inpatient status.  Patient will require least 2 midnights for further evaluation and treatment of present condition.   Status is: Inpatient    Dispo:  Patient From: Home  Planned Disposition: Home, likely on 07/09/2020 once symptomatology has improved and oxygen requirement is close to baseline.  Medically stable for discharge: No, ongoing management of A. fib with RVR, bronchopneumonia, acute hypoxic respiratory failure, acute asthma exacerbation.         Kayleen Memos MD [facility] Pager [phone]  If 7PM-7AM, please contact night-coverage [email or web address] Password [facility]  07/07/2020, 12:58 PM

## 2020-07-07 NOTE — ED Notes (Signed)
Pt sats 91-93% on room air, pt placed on O2 2 L [place], sats increased to 94% on 2 L [place]

## 2020-07-07 NOTE — ED Provider Notes (Signed)
[facility] Emergency Department Provider Note  ____________________________________________   Event Date/Time   First MD Initiated Contact with Patient 07/07/20 1002     (approximate)  I have reviewed the triage vital signs and the nursing notes.   HISTORY  Chief Complaint Shortness of Breath    HPI Tracy Gardner is a 55 y.o. male with asthma, polycythemia who comes in [facility] due to concerns for new diagnosis of atrial fibrillation.  Patient states for the past week he has been having an asthma flare.  Patient states that he gets this when he has the pollen.  He is been taking Mucinex and using his albuterol very frequently.  Today he was going into his primary care doctor for recheck given he was recently diagnosed with diabetes and started on metformin.  While at the clinic they noted that patient was in A. fib with RVR with rates of 1 50-1 60.  Patient denies a history of this or knowing that he was in it.  He denies any chest pain or leg swelling.  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  Patient does report shortness of breath but he stated that he thought it was just related to the pollen and his allergies.  This is been constant staying about the same for the past week, nothing makes it better, worse with exertion.          Past Medical History:  Diagnosis Date  . Asthma, persistent not controlled   . History of nephrectomy, unilateral   . Polycythemia     Patient Active Problem List   Diagnosis Date Noted  . Erythrocytosis 12/03/2017    Past Surgical History:  Procedure Laterality Date  . kidney removal  Right   . NEPHRECTOMY Right     Prior to Admission medications   Medication Sig Start Date End Date Taking? Authorizing Provider  albuterol (ACCUNEB) 0.63 MG/3ML nebulizer solution TAKE 3 MLS (1 VIAL) BY NEBULIZATION EVERY 6 HOURS AS NEEDED FOR  WHEEZING Patient not taking: Reported on 06/03/2020 11/03/18   [provider]  albuterol (VENTOLIN HFA) 108 (90 Base) MCG/ACT inhaler Inhale 2 puffs into the lungs every 6 (six) hours as needed for wheezing. Patient not taking: Reported on 06/03/2020 08/21/14 11/28/18  [provider]  enalapril (VASOTEC) 2.5 MG tablet Take 2.5 mg by mouth daily. 05/09/20   [provider]  metFORMIN (GLUCOPHAGE) 500 MG tablet Take 1 tablet by mouth in the morning and at bedtime. 05/09/20 05/09/21  [provider]  rosuvastatin (CRESTOR) 10 MG tablet Take 10 mg by mouth daily. Patient not taking: Reported on 06/03/2020 07/10/18   [provider]    Allergies Patient has no known allergies.  No family history on file.  Social History Social History   Tobacco Use  . Smoking status: Current Every Day Smoker    Packs/day: 1.00    Types: Cigarettes  . Smokeless tobacco: Never Used  Vaping Use  . Vaping Use: Never used  Substance Use Topics  . Alcohol use: Yes      Review of Systems Constitutional: No fever/chills Eyes: No visual changes. ENT: No sore throat. Cardiovascular: Denies chest pain. Respiratory: Positive shortness of breath, cough, congestion Gastrointestinal: No abdominal pain.  No nausea, no vomiting.  No diarrhea.  No constipation. Genitourinary: Negative for dysuria. Musculoskeletal: Negative for back pain. Skin: Negative for rash. Neurological: Negative  for headaches, focal weakness or numbness. All other ROS negative ____________________________________________   PHYSICAL EXAM:  VITAL SIGNS: Blood pressure 115/85, pulse 75, temperature 98.2 F (36.8 C), temperature source Oral, resp. rate 20, height 5\' 11"  (1.803 m), weight 115.7 kg, SpO2 92 %.   Constitutional: Alert and oriented. Well appearing and in no acute distress. Eyes: Conjunctivae are normal. EOMI. Head: Atraumatic. Nose: No congestion/rhinnorhea. Mouth/Throat: Mucous  membranes are moist.   Neck: No stridor. Trachea Midline. FROM Cardiovascular irregular, tachycardic. Grossly normal heart sounds.  Good peripheral circulation. Respiratory: Increased respiratory effort.  No retractions.  Wheezing noted bilaterally.  Oxygen levels around 92% Gastrointestinal: Soft and nontender. No distention. No abdominal bruits.  Musculoskeletal: No lower extremity tenderness nor edema.  No joint effusions. Neurologic:  Normal speech and language. No gross focal neurologic deficits are appreciated.  Skin:  Skin is warm, dry and intact. No rash noted. Psychiatric: Mood and affect are normal. Speech and behavior are normal. GU: Deferred   ____________________________________________   LABS (all labs ordered are listed, but only abnormal results are displayed)  Labs Reviewed  BASIC METABOLIC PANEL - Abnormal; Notable for the following components:      Result Value   Glucose, Bld 110 (*)    Calcium 8.8 (*)    All other components within normal limits  CBC - Abnormal; Notable for the following components:   WBC 11.2 (*)    RBC 6.10 (*)    HCT 53.4 (*)    All other components within normal limits  BRAIN NATRIURETIC PEPTIDE - Abnormal; Notable for the following components:   B Natriuretic Peptide 240.2 (*)    All other components within normal limits  RESP PANEL BY RT-PCR (FLU A&B, COVID) ARPGX2  CULTURE, BLOOD (ROUTINE X 2)  CULTURE, BLOOD (ROUTINE X 2)  MAGNESIUM  TSH  T4, FREE  HEPATIC FUNCTION PANEL  PROCALCITONIN  LACTIC ACID, PLASMA  LACTIC ACID, PLASMA  TROPONIN I (HIGH SENSITIVITY)  TROPONIN I (HIGH SENSITIVITY)   ____________________________________________   ED ECG REPORT I, Vanessa Sedgwick, the attending physician, personally viewed and interpreted this ECG.  EKG is atrial fibrillation rate of 148, no ST elevation, no T wave inversions, normal intervals ____________________________________________  RADIOLOGY Robert Bellow, personally viewed  and evaluated these images (plain radiographs) as part of my medical decision making, as well as reviewing the written report by the radiologist.  ED MD interpretation: No pneumonia noted on x-ray  Official radiology report(s): CT Angio Chest PE W and/or Wo Contrast  Result Date: 07/07/2020 CLINICAL DATA:  Shortness of breath.  Atrial fibrillation. EXAM: CT ANGIOGRAPHY CHEST WITH CONTRAST TECHNIQUE: Multidetector CT imaging of the chest was performed using the standard protocol during bolus administration of intravenous contrast. Multiplanar CT image reconstructions and MIPs were obtained to evaluate the vascular anatomy. CONTRAST:  34mL OMNIPAQUE IOHEXOL 350 MG/ML SOLN COMPARISON:  Chest radiography same day FINDINGS: Cardiovascular: Heart size upper limits of normal. Relatively mild coronary artery calcification seems to be present. Minimal aortic atherosclerotic calcification. Pulmonary arterial opacification is excellent. No pulmonary emboli. Mediastinum/Nodes: No mass or lymphadenopathy. Lungs/Pleura: No pulmonary edema or pleural effusion. The right lung is clear. There is mild hazy and patchy infiltrate in the left upper lobe and lingula consistent with bronchopneumonia. No dense consolidation, collapse or effusion. 4 mm subpleural nodule in the left lower lobe axial image 71. Upper Abdomen: Negative Musculoskeletal: Ordinary degenerative changes affect the thoracic spine. Chronic calcified disc herniation at T12-L1. Review of the MIP  images confirms the above findings. IMPRESSION: 1. No pulmonary emboli. 2. Mild hazy and patchy infiltrate in the left upper lobe and lingula consistent with bronchopneumonia. No dense consolidation, collapse or effusion. 3. 4 mm subpleural nodule in the left lower lobe. This does not require further follow-up. 4. Mild coronary artery calcification and very minimal aortic atherosclerotic calcification. Electronically Signed   By: Nelson Chimes M.D.   On: 07/07/2020 11:19    DG Chest Port 1 View  Result Date: 07/07/2020 CLINICAL DATA:  Shortness of breath.  Atrial fibrillation. EXAM: PORTABLE CHEST 1 VIEW COMPARISON:  01/10/2018. FINDINGS: Mild cardiac enlargement. Pulmonary vascular congestion identified. No pleural effusions. No airspace opacification. Visualized osseous structures are unremarkable. IMPRESSION: Cardiac enlargement and pulmonary vascular congestion. Electronically Signed   By: Kerby Moors M.D.   On: 07/07/2020 10:46    ____________________________________________   PROCEDURES  Procedure(s) performed (including Critical Care):  .1-3 Lead EKG Interpretation Performed by: Vanessa Woodlynne, MD Authorized by: Vanessa Tavistock, MD     Interpretation: abnormal     ECG rate:  150s   ECG rate assessment: tachycardic     Rhythm: atrial fibrillation     Ectopy: none     Conduction: normal   .Critical Care Performed by: Vanessa [place], MD Authorized by: Vanessa McKittrick, MD   Critical care provider statement:    Critical care time (minutes):  45   Critical care was necessary to treat or prevent imminent or life-threatening deterioration of the following conditions: A. fib with RVR.   Critical care was time spent personally by me on the following activities:  Discussions with consultants, evaluation of patient's response to treatment, examination of patient, ordering and performing treatments and interventions, ordering and review of laboratory studies, ordering and review of radiographic studies, pulse oximetry, re-evaluation of patient's condition, obtaining history from patient or surrogate and review of old charts     ____________________________________________   INITIAL IMPRESSION / ASSESSMENT AND PLAN / ED COURSE  Jensyn Stoneburner Oesterle was evaluated in Emergency Department on 07/07/2020 for the symptoms described in the history of present illness. He was evaluated in the context of the global COVID-19 pandemic, which necessitated consideration that  the patient might be at risk for infection with the SARS-CoV-2 virus that causes COVID-19. Institutional protocols and algorithms that pertain to the evaluation of patients at risk for COVID-19 are in a state of rapid change based on information released by regulatory bodies including the CDC and federal and state organizations. These policies and algorithms were followed during the patient's care in the ED.    Patient comes in with new A. fib with RVR with oxygen levels of 92% with diffuse wheezing noted.  Will get labs to evaluate for electrolyte abnormalities, AKI, thyroid dysfunction.  We will keep patient on the cardiac monitor and give some diltiazem to try to help with the heart rates.  We will hold off on treatments with albuterol due to my concern that this can further exacerbate the A. fib with RVR.  Given patient does have a history of DVT and he has had a long travel I feel like patient has a high Wells score therefore will proceed with CT PE to rule out pulmonary embolism.  No evidence of fluid overload on exam but will get BNP.  Patient initially given 10 mg of diltiazem and heart rates came down so patient was placed on a dill drip.  I held off on albuterol due to my concern  that this would worsen the A. fib with RVR.  We will give a dose of steroids.  Patient's labs are so far reassuring.  BNP slightly elevated but there is no evidence of edema on CT scan.  However CT scan is concerning for upper lobe bronchopneumonia on the left.   11:48 AM Patient had to be placed on 3 L of oxygen due to low oxygen levels most likely secondary to the pneumonia.  Given this concern for pneumonia sepsis alert was called due to patient being tachycardic with elevated respiratory rate.  Will obtain lactate and give additional liter of fluid but I do not want to give too much fluid and cause pulmonary edema.  We will also get blood cultures and start patient on ceftriaxone azithromycin.  He denies any recent  antibiotics and/or recent hospitalizations.  Will discuss hospital team for admission      ____________________________________________   FINAL CLINICAL IMPRESSION(S) / ED DIAGNOSES   Final diagnoses:  Sepsis, due to unspecified organism, unspecified whether acute organ dysfunction present [facility])  Community acquired pneumonia of left upper lobe of lung  Acute respiratory failure with hypoxia ([place])      MEDICATIONS GIVEN DURING THIS VISIT:  Medications  diltiazem (CARDIZEM) 125 mg in dextrose 5% 125 mL (1 mg/mL) infusion (5 mg/hr Intravenous New Bag/Given 07/07/20 1120)  cefTRIAXone (ROCEPHIN) 2 g in sodium chloride 0.9 % 100 mL IVPB (has no administration in time range)  azithromycin (ZITHROMAX) 500 mg in sodium chloride 0.9 % 250 mL IVPB (has no administration in time range)  methylPREDNISolone sodium succinate (SOLU-MEDROL) 125 mg/2 mL injection 125 mg (has no administration in time range)  sodium chloride 0.9 % bolus 1,000 mL (has no administration in time range)  diltiazem (CARDIZEM) injection 10 mg (10 mg Intravenous Given 07/07/20 1033)  iohexol (OMNIPAQUE) 350 MG/ML injection 75 mL (75 mLs Intravenous Contrast Given 07/07/20 1056)  sodium chloride 0.9 % bolus 500 mL (500 mLs Intravenous New Bag/Given 07/07/20 1117)     ED Discharge Orders    None       Note:  This document was prepared using Dragon voice recognition software and may include unintentional dictation errors.   Vanessa [place], MD 07/07/20 1150

## 2020-07-07 NOTE — Consult Note (Signed)
CODE SEPSIS - PHARMACY COMMUNICATION  **Broad Spectrum Antibiotics should be administered within 1 hour of Sepsis diagnosis**  Time Code Sepsis Called/Page Received: 1149  Antibiotics Ordered: ceftriaxone + azithromycin  Time of 1st antibiotic administration: [place] ,PharmD Clinical Pharmacist  07/07/2020  2:09 PM

## 2020-07-07 NOTE — ED Notes (Signed)
This RN accompanied pt to CT, pt tolerated well, EDP updated on pt VS
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
## 2020-07-08 ENCOUNTER — Inpatient Hospital Stay: Payer: 59 | Admitting: Certified Registered Nurse Anesthetist

## 2020-07-08 ENCOUNTER — Inpatient Hospital Stay (HOSPITAL_COMMUNITY)
Admit: 2020-07-08 | Discharge: 2020-07-08 | Disposition: A | Discharge status: Home / Self Care | Payer: 59 | Attending: Nurse Practitioner | Admitting: Nurse Practitioner

## 2020-07-08 ENCOUNTER — Encounter
Admission: EM | Disposition: A | Discharge status: Home / Self Care | Payer: Self-pay | Source: Home / Self Care | Attending: Obstetrics and Gynecology

## 2020-07-08 ENCOUNTER — Encounter: Payer: Self-pay | Admitting: Internal Medicine

## 2020-07-08 DIAGNOSIS — E118 Type 2 diabetes mellitus with unspecified complications: Secondary | ICD-10-CM

## 2020-07-08 DIAGNOSIS — J180 Bronchopneumonia, unspecified organism: Secondary | ICD-10-CM

## 2020-07-08 DIAGNOSIS — I361 Nonrheumatic tricuspid (valve) insufficiency: Secondary | ICD-10-CM | POA: Diagnosis not present

## 2020-07-08 DIAGNOSIS — I4891 Unspecified atrial fibrillation: Secondary | ICD-10-CM

## 2020-07-08 DIAGNOSIS — I340 Nonrheumatic mitral (valve) insufficiency: Secondary | ICD-10-CM

## 2020-07-08 DIAGNOSIS — J9601 Acute respiratory failure with hypoxia: Secondary | ICD-10-CM | POA: Diagnosis not present

## 2020-07-08 DIAGNOSIS — J189 Pneumonia, unspecified organism: Secondary | ICD-10-CM | POA: Diagnosis not present

## 2020-07-08 HISTORY — PX: CARDIOVERSION: SHX1299

## 2020-07-08 HISTORY — PX: TEE WITHOUT CARDIOVERSION: SHX5443

## 2020-07-08 LAB — CBC
HCT: 48.7 % (ref 39.0–52.0)
Hemoglobin: 15.4 g/dL (ref 13.0–17.0)
MCH: 28 pg (ref 26.0–34.0)
MCHC: 31.6 g/dL (ref 30.0–36.0)
MCV: 88.5 fL (ref 80.0–100.0)
Platelets: 174 10*3/uL (ref 150–400)
RBC: 5.5 MIL/uL (ref 4.22–5.81)
RDW: 14.4 % (ref 11.5–15.5)
WBC: 14.1 10*3/uL — ABNORMAL HIGH (ref 4.0–10.5)
nRBC: 0 % (ref 0.0–0.2)

## 2020-07-08 LAB — BASIC METABOLIC PANEL
Anion gap: 8 (ref 5–15)
BUN: 23 mg/dL — ABNORMAL HIGH (ref 6–20)
CO2: 23 mmol/L (ref 22–32)
Calcium: 8.4 mg/dL — ABNORMAL LOW (ref 8.9–10.3)
Chloride: 103 mmol/L (ref 98–111)
Creatinine, Ser: 1.12 mg/dL (ref 0.61–1.24)
GFR, Estimated: >60 mL/min (ref >60)
Glucose, Bld: 169 mg/dL — ABNORMAL HIGH (ref 70–99)
Potassium: 4.9 mmol/L (ref 3.5–5.1)
Sodium: 134 mmol/L — ABNORMAL LOW (ref 135–145)

## 2020-07-08 LAB — LEGIONELLA PNEUMOPHILA SEROGP 1 UR AG: L. pneumophila Serogp 1 Ur Ag: NEGATIVE

## 2020-07-08 LAB — GLUCOSE, CAPILLARY
Glucose-Capillary: 171 mg/dL — ABNORMAL HIGH (ref 70–99)
Glucose-Capillary: 155 mg/dL — ABNORMAL HIGH (ref 70–99)
Glucose-Capillary: 171 mg/dL — ABNORMAL HIGH (ref 70–99)
Glucose-Capillary: 181 mg/dL — ABNORMAL HIGH (ref 70–99)
Glucose-Capillary: 188 mg/dL — ABNORMAL HIGH (ref 70–99)

## 2020-07-08 LAB — HEPARIN LEVEL (UNFRACTIONATED)
Heparin Unfractionated: 0.46 IU/mL (ref 0.30–0.70)
Heparin Unfractionated: 0.44 IU/mL (ref 0.30–0.70)

## 2020-07-08 LAB — MAGNESIUM: Magnesium: 1.7 mg/dL (ref 1.7–2.4)

## 2020-07-08 LAB — PHOSPHORUS: Phosphorus: 4.9 mg/dL — ABNORMAL HIGH (ref 2.5–4.6)

## 2020-07-08 LAB — PROCALCITONIN: Procalcitonin: <0.1 ng/mL

## 2020-07-08 SURGERY — ECHOCARDIOGRAM, TRANSESOPHAGEAL
Anesthesia: General

## 2020-07-08 SURGERY — CARDIOVERSION
Anesthesia: Choice

## 2020-07-08 MED ORDER — SODIUM CHLORIDE FLUSH 0.9 % IV SOLN
INTRAVENOUS | Status: AC
  Filled 2020-07-08: qty 10

## 2020-07-08 MED ORDER — PREDNISONE 20 MG PO TABS
40.0000 mg | ORAL_TABLET | Freq: Every day | ORAL | Status: DC
  Administered 2020-07-08 – 2020-07-11 (×4): 40 mg via ORAL
  Filled 2020-07-08: qty 4
  Filled 2020-07-08 (×3): qty 2

## 2020-07-08 MED ORDER — LIDOCAINE VISCOUS HCL 2 % MT SOLN
OROMUCOSAL | Status: AC
  Filled 2020-07-08: qty 15

## 2020-07-08 MED ORDER — PROPOFOL 10 MG/ML IV BOLUS
INTRAVENOUS | Status: AC
  Filled 2020-07-08: qty 20

## 2020-07-08 MED ORDER — AMIODARONE HCL 200 MG PO TABS
400.0000 mg | ORAL_TABLET | Freq: Two times a day (BID) | ORAL | Status: DC
  Administered 2020-07-08 – 2020-07-10 (×5): 400 mg via ORAL
  Filled 2020-07-08 (×6): qty 2

## 2020-07-08 MED ORDER — BISOPROLOL FUMARATE 5 MG PO TABS
5.0000 mg | ORAL_TABLET | Freq: Two times a day (BID) | ORAL | Status: DC
  Administered 2020-07-08 – 2020-07-12 (×8): 5 mg via ORAL
  Filled 2020-07-08 (×14): qty 1

## 2020-07-08 MED ORDER — BUTAMBEN-TETRACAINE-BENZOCAINE 2-2-14 % EX AERO
INHALATION_SPRAY | CUTANEOUS | Status: AC
  Filled 2020-07-08: qty 5

## 2020-07-08 MED ORDER — APIXABAN 5 MG PO TABS
5.0000 mg | ORAL_TABLET | Freq: Two times a day (BID) | ORAL | Status: DC
  Administered 2020-07-08 – 2020-07-12 (×9): 5 mg via ORAL
  Filled 2020-07-08 (×9): qty 1

## 2020-07-08 MED ORDER — IPRATROPIUM-ALBUTEROL 0.5-2.5 (3) MG/3ML IN SOLN
3.0000 mL | Freq: Three times a day (TID) | RESPIRATORY_TRACT | Status: DC
  Administered 2020-07-09 – 2020-07-12 (×9): 3 mL via RESPIRATORY_TRACT
  Filled 2020-07-08 (×10): qty 3

## 2020-07-08 MED ORDER — PHENYLEPHRINE HCL (PRESSORS) 10 MG/ML IV SOLN
INTRAVENOUS | Status: DC | PRN
  Administered 2020-07-08: 50 ug via INTRAVENOUS
  Administered 2020-07-08: 100 ug via INTRAVENOUS
  Administered 2020-07-08: 50 ug via INTRAVENOUS
  Administered 2020-07-08 (×3): 100 ug via INTRAVENOUS

## 2020-07-08 MED ORDER — SODIUM CHLORIDE 0.9 % IV SOLN: INTRAVENOUS | Status: DC

## 2020-07-08 MED ORDER — ONDANSETRON HCL 4 MG/2ML IJ SOLN: 4.0000 mg | Freq: Once | INTRAMUSCULAR | Status: DC | PRN

## 2020-07-08 MED ORDER — MIDAZOLAM HCL 2 MG/2ML IJ SOLN
INTRAMUSCULAR | Status: AC
  Filled 2020-07-08: qty 2

## 2020-07-08 MED ORDER — MIDAZOLAM HCL 2 MG/2ML IJ SOLN
INTRAMUSCULAR | Status: DC | PRN
  Administered 2020-07-08: 2 mg via INTRAVENOUS

## 2020-07-08 MED ORDER — PROPOFOL 10 MG/ML IV BOLUS
INTRAVENOUS | Status: DC | PRN
  Administered 2020-07-08: 50 mg via INTRAVENOUS
  Administered 2020-07-08: 30 mg via INTRAVENOUS
  Administered 2020-07-08: 20 mg via INTRAVENOUS
  Administered 2020-07-08: 30 mg via INTRAVENOUS
  Administered 2020-07-08: 20 mg via INTRAVENOUS

## 2020-07-08 NOTE — Progress Notes (Signed)
PROGRESS NOTE    Tracy Gardner  YBO:[id number] DOB: June 25, 1965 DOA: 07/07/2020 PCP: [name], PA-C  Outpatient Specialists: heme/onc    Brief Narrative:   Tracy Gardner is a 55 y.o. male with medical history significant for asthma, tobacco use disorder, erythrocytosis, type 2 diabetes, essential hypertension, hyperlipidemia who presented to [facility] ED from his PCPs office after being advised to present to the ED due to new onset A. fib with RVR.  Patient reports for the last week he has had a productive cough with brownish, greenish sputum and shortness of breath with minimal exertion.  No chest pain.  No fever.  Thought they were allergies symptoms.  Ongoing tobacco use, half a pack a day.  His cough has been more productive than his usual chronic smoker's cough.  No lower extremity edema or tenderness.  He went to his primary care provider today for a regular checkup after recently started on metformin for type 2 diabetes.  While he was there vital signs were abnormal, his heart rate was in the 150s.  Twelve-lead EKG revealed A. fib with RVR.  Recommended to come to the ED for further evaluation.  A. fib was confirmed.  Hypoxic on exam requiring 2 to 3 L nasal cannula.  Due to concern for PE he had a CTA chest which was negative for PE but showed bronchial pneumonia.  Started on IV antibiotics empirically in the ED.  Also received a dose of IV Solu-Medrol 125 mg x 1 due to wheezing likely from asthma exacerbation.  EDP called for admission.  ED Course:  Afebrile.  BP 104/79, pulse 120, respiratory rate 32, O2 saturation 94% on 3 L.  Lab studies remarkable for BNP 240, troponin 17, normal.  WBC 11.2.  Twelve-lead EKG A. fib with rate of 148, nonspecific ST-T changes.  QTc 473.   Assessment & Plan:   Principal Problem:   New onset atrial fibrillation (HCC) Active Problems:   Dilated cardiomyopathy ([place])   Bronchial pneumonia   Smoker   Diabetes mellitus type 2 with complications  ([place])  New onset A. fib with RVR HFrEF, dilated cardiomyopathy Presented to the ED as a recommendation from his PCP. Heart rate in the 150s in the ED, twelve-lead EKG revealed A. fib with RVR, started on diltiazem drip. CHA2DS2-VASc score 2, no history of head trauma or GI bleed. Start oral anticoagulation Eliquis, patient understands and agrees with plan. Continue to monitor on telemetry. EF 35-40 on TTE. TEE cardioversion performed 4/29, successful. At risk for recurrence. tsh wnl. No PE on cta. Asthma exacerbation/pna likely contributory, possible this is longstanding. euvolemic - cards following - amio started, plan to wean off dilt - heparin continuing  Acute hypoxic respiratory failure secondary to bronchial pneumonia and acute asthma exacerbation. Not on oxygen supplementation at baseline. Currently requiring 3 L to maintain a saturation greater than 90%. Continue IV steroids, bronchodilators, incentive spirometer. Mobilize as tolerated. Wean off oxygen supplementation as tolerated.  Bronchial pneumonia, POA Presented with 1 week of dyspnea and productive cough. Obtain sputum culture Strep pneumonia and Legionella urine antigen Continue Rocephin and azithromycin started in the ED x5 days. Continue bronchodilators and pulmonary toilet. procalcitonin is low.  Type 2 diabetes with hyperglycemia Recently started on metformin Update hemoglobin A1c Hold off oral hypoglycemics Start insulin sliding scale  Tobacco use disorder Reports he has cut down to half a pack a day from 1 pack a day since the age of 89. Patient has received tobacco cessation  counseling at bedside Nicotine patch as needed.  Essential hypertension BP is currently at goal Hold off home oral antihypertensive while on Cardizem drip to avoid hypotension. Continue to closely monitor vital signs.  Hyperlipidemia Resume home regimen  Suspected OSA Made pt aware likely contributes to current condition,  needs outpatient sleep study   DVT prophylaxis: heparin Code Status: full Family Communication: wife updated bedside 4/29  Level of care: Stepdown Status is: Inpatient  Remains inpatient appropriate because:Inpatient level of care appropriate due to severity of illness   Dispo:  Patient From: Home  Planned Disposition: Home  Medically stable for discharge: No          Consultants:  cardiology  Procedures: TEE cardioversion 4/29  Antimicrobials:  Azithromycin/ceftriaxone 4/28>    Subjective: This morning feeling well. No dyspnea, is wheezing. Has appetite  Objective: Vitals:   07/08/20 0850 07/08/20 0900 07/08/20 0910 07/08/20 1015  BP: 96/72 (!) 89/74 (!) 87/62 97/66  Pulse: 78 68 71 76  Resp: 16 (!) 21 (!) 22 (!) 27  Temp:      TempSrc:      SpO2: 91% 92% 94% 93%  Weight:      Height:        Intake/Output Summary (Last 24 hours) at 07/08/2020 1042 Last data filed at 07/08/2020 0825 Gross per 24 hour  Intake 1785.43 ml  Output 700 ml  Net 1085.43 ml   Filed Weights   07/07/20 1006 07/07/20 1451  Weight: 115.7 kg 114.3 kg    Examination:  General exam: Appears calm and comfortable  Respiratory system: Clear to auscultation. Respiratory effort normal. Cardiovascular system: S1 & S2 heard, RRR. Soft systolic murmur Gastrointestinal system: Abdomen is nondistended, soft and nontender. No organomegaly or masses felt. Normal bowel sounds heard. Central nervous system: Alert and oriented. No focal neurological deficits. Extremities: Symmetric 5 x 5 power. Skin: No rashes, lesions or ulcers Psychiatry: Judgement and insight appear normal. Mood & affect appropriate.     Data Reviewed: I have personally reviewed following labs and imaging studies  CBC: Recent Labs  Lab 07/07/20 1010 07/08/20 0536  WBC 11.2* 14.1*  HGB 17.0 15.4  HCT 53.4* 48.7  MCV 87.5 88.5  PLT 203 466   Basic Metabolic Panel: Recent Labs  Lab 07/07/20 1010  07/07/20 1226 07/08/20 0536  NA 136  --  134*  K 4.3  --  4.9  CL 101  --  103  CO2 26  --  23  GLUCOSE 110*  --  169*  BUN 17  --  23*  CREATININE 1.08 1.13 1.12  CALCIUM 8.8*  --  8.4*  MG 1.6*  --  1.7  PHOS  --   --  4.9*   GFR: Estimated Creatinine Clearance: 95.8 mL/min (by C-G formula based on SCr of 1.12 mg/dL). Liver Function Tests: Recent Labs  Lab 07/07/20 1010  AST 18  ALT 19  ALKPHOS 65  BILITOT 1.2  PROT 7.2  ALBUMIN 3.5   No results for input(s): LIPASE, AMYLASE in the last 168 hours. No results for input(s): AMMONIA in the last 168 hours. Coagulation Profile: Recent Labs  Lab 07/07/20 1601  INR 1.0   Cardiac Enzymes: No results for input(s): CKTOTAL, CKMB, CKMBINDEX, TROPONINI in the last 168 hours. BNP (last 3 results) No results for input(s): PROBNP in the last 8760 hours. HbA1C: Recent Labs    07/07/20 1450  HGBA1C 6.5*   CBG: Recent Labs  Lab 07/07/20 1449 07/07/20 1822 07/07/20  2113 07/08/20 0944  GLUCAP 126* 228* 195* 155*   Lipid Profile: Recent Labs    07/07/20 1450  CHOL 174  HDL 30*  LDLCALC 119*  TRIG 127  CHOLHDL 5.8   Thyroid Function Tests: Recent Labs    07/07/20 1010  TSH 3.185  FREET4 1.15*   Anemia Panel: No results for input(s): VITAMINB12, FOLATE, FERRITIN, TIBC, IRON, RETICCTPCT in the last 72 hours. Urine analysis:    Component Value Date/Time   COLORURINE Yellow 01/24/2013 1607   APPEARANCEUR Clear 01/24/2013 1607   LABSPEC 1.033 01/24/2013 1607   PHURINE 5.0 01/24/2013 1607   GLUCOSEU Negative 01/24/2013 1607   HGBUR Negative 01/24/2013 1607   BILIRUBINUR Negative 01/24/2013 1607   KETONESUR Negative 01/24/2013 1607   PROTEINUR >=500 01/24/2013 1607   NITRITE Negative 01/24/2013 1607   LEUKOCYTESUR Negative 01/24/2013 1607   Sepsis Labs: @LABRCNTIP (procalcitonin:4,lacticidven:4)  ) Recent Results (from the past 240 hour(s))  Resp Panel by RT-PCR (Flu A&B, Covid) Nasopharyngeal Swab      Status: None   Collection Time: 07/07/20 10:11 AM   Specimen: Nasopharyngeal Swab; Nasopharyngeal(NP) swabs in vial transport medium  Result Value Ref Range Status   SARS Coronavirus 2 by RT PCR NEGATIVE NEGATIVE Final    Comment: (NOTE) SARS-CoV-2 target nucleic acids are NOT DETECTED.  The SARS-CoV-2 RNA is generally detectable in upper respiratory specimens during the acute phase of infection. The lowest concentration of SARS-CoV-2 viral copies this assay can detect is 138 copies/mL. A negative result does not preclude SARS-Cov-2 infection and should not be used as the sole basis for treatment or other patient management decisions. A negative result may occur with  improper specimen collection/handling, submission of specimen other than nasopharyngeal swab, presence of viral mutation(s) within the areas targeted by this assay, and inadequate number of viral copies(<138 copies/mL). A negative result must be combined with clinical observations, patient history, and epidemiological information. The expected result is Negative.  Fact Sheet for Patients:  EntrepreneurPulse.com.au  Fact Sheet for Healthcare Providers:  IncredibleEmployment.be  This test is no t yet approved or cleared by the [place] FDA and  has been authorized for detection and/or diagnosis of SARS-CoV-2 by FDA under an Emergency Use Authorization (EUA). This EUA will remain  in effect (meaning this test can be used) for the duration of the COVID-19 declaration under Section 564(b)(1) of the Act, 21 U.S.C.section 360bbb-3(b)(1), unless the authorization is terminated  or revoked sooner.       Influenza A by PCR NEGATIVE NEGATIVE Final   Influenza B by PCR NEGATIVE NEGATIVE Final    Comment: (NOTE) The Xpert Xpress SARS-CoV-2/FLU/RSV plus assay is intended as an aid in the diagnosis of influenza from Nasopharyngeal swab specimens and should not be used as a sole basis  for treatment. Nasal washings and aspirates are unacceptable for Xpert Xpress SARS-CoV-2/FLU/RSV testing.  Fact Sheet for Patients: EntrepreneurPulse.com.au  Fact Sheet for Healthcare Providers: IncredibleEmployment.be  This test is not yet approved or cleared by the [place] FDA and has been authorized for detection and/or diagnosis of SARS-CoV-2 by FDA under an Emergency Use Authorization (EUA). This EUA will remain in effect (meaning this test can be used) for the duration of the COVID-19 declaration under Section 564(b)(1) of the Act, 21 U.S.C. section 360bbb-3(b)(1), unless the authorization is terminated or revoked.  Performed at [facility], [address] [place], [place] [address]   Blood culture (routine x 2)     Status: None (Preliminary result)  Collection Time: 07/07/20 12:25 PM   Specimen: BLOOD  Result Value Ref Range Status   Specimen Description BLOOD RIGHT ANTECUBITAL  Final   Special Requests   Final    BOTTLES DRAWN AEROBIC AND ANAEROBIC Blood Culture adequate volume   Culture   Final    NO GROWTH < 24 HOURS Performed at [facility], [address] [place], [place] [address]    Report Status PENDING  Incomplete  Blood culture (routine x 2)     Status: None (Preliminary result)   Collection Time: 07/07/20 12:25 PM   Specimen: BLOOD  Result Value Ref Range Status   Specimen Description BLOOD BLOOD RIGHT HAND  Final   Special Requests   Final    BOTTLES DRAWN AEROBIC AND ANAEROBIC Blood Culture adequate volume   Culture   Final    NO GROWTH < 24 HOURS Performed at [facility], [address] [place], [place] [address]    Report Status PENDING  Incomplete  Expectorated Sputum Assessment w Gram Stain, Rflx to Resp Cult     Status: None   Collection Time: 07/07/20 12:28 PM   Specimen: Expectorated Sputum  Result Value Ref Range Status   Specimen Description EXPECTORATED SPUTUM   Final   Special Requests Normal  Final   Sputum evaluation   Final    THIS SPECIMEN IS ACCEPTABLE FOR SPUTUM CULTURE Performed at [facility], [address] [place], [place] [address]    Report Status 07/07/2020 FINAL  Final  Culture, Respiratory w Gram Stain     Status: None (Preliminary result)   Collection Time: 07/07/20 12:28 PM  Result Value Ref Range Status   Specimen Description   Final    EXPECTORATED SPUTUM Performed at [facility] [address], [place], [place] [address]    Special Requests   Final    Normal Reflexed from [id number] Performed at [facility], [address], [place], [place] [address]    Gram Stain PENDING  Incomplete   Culture   Final    TOO YOUNG TO READ Performed at [facility], [place] [address], [place], [place] [address]    Report Status PENDING  Incomplete  MRSA PCR Screening     Status: None   Collection Time: 07/07/20  2:54 PM   Specimen: Nasopharyngeal  Result Value Ref Range Status   MRSA by PCR NEGATIVE NEGATIVE Final    Comment:        The GeneXpert MRSA Assay (FDA approved for NASAL specimens only), is one component of a comprehensive MRSA colonization surveillance program. It is not intended to diagnose MRSA infection nor to guide or monitor treatment for MRSA infections. Performed at [facility], [address], [place], [place] [address]          Radiology Studies: CT Angio Chest PE W and/or Wo Contrast  Result Date: 07/07/2020 CLINICAL DATA:  Shortness of breath.  Atrial fibrillation. EXAM: CT ANGIOGRAPHY CHEST WITH CONTRAST TECHNIQUE: Multidetector CT imaging of the chest was performed using the standard protocol during bolus administration of intravenous contrast. Multiplanar CT image reconstructions and MIPs were obtained to evaluate the vascular anatomy. CONTRAST:  71mL OMNIPAQUE IOHEXOL 350 MG/ML SOLN COMPARISON:  Chest radiography same day FINDINGS: Cardiovascular:  Heart size upper limits of normal. Relatively mild coronary artery calcification seems to be present. Minimal aortic atherosclerotic calcification. Pulmonary arterial opacification is excellent. No pulmonary emboli. Mediastinum/Nodes: No mass or lymphadenopathy. Lungs/Pleura: No pulmonary edema or  pleural effusion. The right lung is clear. There is mild hazy and patchy infiltrate in the left upper lobe and lingula consistent with bronchopneumonia. No dense consolidation, collapse or effusion. 4 mm subpleural nodule in the left lower lobe axial image 71. Upper Abdomen: Negative Musculoskeletal: Ordinary degenerative changes affect the thoracic spine. Chronic calcified disc herniation at T12-L1. Review of the MIP images confirms the above findings. IMPRESSION: 1. No pulmonary emboli. 2. Mild hazy and patchy infiltrate in the left upper lobe and lingula consistent with bronchopneumonia. No dense consolidation, collapse or effusion. 3. 4 mm subpleural nodule in the left lower lobe. This does not require further follow-up. 4. Mild coronary artery calcification and very minimal aortic atherosclerotic calcification. Electronically Signed   By: Nelson Chimes M.D.   On: 07/07/2020 11:19   DG Chest Port 1 View  Result Date: 07/07/2020 CLINICAL DATA:  Shortness of breath.  Atrial fibrillation. EXAM: PORTABLE CHEST 1 VIEW COMPARISON:  01/10/2018. FINDINGS: Mild cardiac enlargement. Pulmonary vascular congestion identified. No pleural effusions. No airspace opacification. Visualized osseous structures are unremarkable. IMPRESSION: Cardiac enlargement and pulmonary vascular congestion. Electronically Signed   By: Kerby Moors M.D.   On: 07/07/2020 10:46   ECHOCARDIOGRAM COMPLETE  Result Date: 07/07/2020    ECHOCARDIOGRAM REPORT   Patient Name:   Tracy Gardner Date of Exam: 07/07/2020 Medical Rec #:  [id number]    Height:       71.0 in Accession #:    [id number]   Weight:       255.0 lb Date of Birth:  [birth year]     BSA:           2.338 m Patient Age:    69 years     BP:           100/66 mmHg Patient Gender: M            HR:           100 bpm. Exam Location:  [facility] Procedure: 2D Echo, Color Doppler and Cardiac Doppler Indications:     Syncope R55  History:         Patient has no prior history of Echocardiogram examinations. No                  heart history listed in chart.  Sonographer:     Sherrie Sport RDCS (AE) Referring Phys:  [id number] [place] Diagnosing Phys: [name] MD IMPRESSIONS  1. Left ventricular ejection fraction, by estimation, is 35 to 40%. The left ventricle has moderately decreased function. The left ventricle demonstrates global hypokinesis. The left ventricular internal cavity size was mildly dilated. Left ventricular diastolic parameters are indeterminate.  2. Right ventricular systolic function is normal. The right ventricular size is normal. There is normal pulmonary artery systolic pressure.  3. Left atrial size was mildly dilated. FINDINGS  Left Ventricle: Left ventricular ejection fraction, by estimation, is 35 to 40%. The left ventricle has moderately decreased function. The left ventricle demonstrates global hypokinesis. The left ventricular internal cavity size was mildly dilated. There is no left ventricular hypertrophy. Left ventricular diastolic parameters are indeterminate. Right Ventricle: The right ventricular size is normal. No increase in right ventricular wall thickness. Right ventricular systolic function is normal. There is normal pulmonary artery systolic pressure. The tricuspid regurgitant velocity is 1.70 m/s, and  with an assumed right atrial pressure of 5 mmHg, the estimated right ventricular systolic pressure is 85.4 mmHg. Left Atrium: Left atrial size was mildly dilated.  Right Atrium: Right atrial size was normal in size. Pericardium: There is no evidence of pericardial effusion. Mitral Valve: The mitral valve is normal in structure. No evidence of mitral valve regurgitation. No  evidence of mitral valve stenosis. Tricuspid Valve: The tricuspid valve is normal in structure. Tricuspid valve regurgitation is not demonstrated. No evidence of tricuspid stenosis. Aortic Valve: The aortic valve was not well visualized. Aortic valve regurgitation is not visualized. No aortic stenosis is present. Aortic valve mean gradient measures 1.3 mmHg. Aortic valve peak gradient measures 2.2 mmHg. Aortic valve area, by VTI measures 2.64 cm. Pulmonic Valve: The pulmonic valve was normal in structure. Pulmonic valve regurgitation is not visualized. No evidence of pulmonic stenosis. Aorta: The aortic root is normal in size and structure. Venous: The inferior vena cava is normal in size with greater than 50% respiratory variability, suggesting right atrial pressure of 3 mmHg. IAS/Shunts: No atrial level shunt detected by color flow Doppler.  LEFT VENTRICLE PLAX 2D LVIDd:         5.91 cm      Diastology LVIDs:         4.75 cm      LV e' medial:   8.92 cm/s LV PW:         1.05 cm      LV E/e' medial: 6.9 LV IVS:        0.85 cm LVOT diam:     2.10 cm LV SV:         26 LV SV Index:   11 LVOT Area:     3.46 cm  LV Volumes (MOD) LV vol d, MOD A2C: 143.0 ml LV vol d, MOD A4C: 121.0 ml LV vol s, MOD A2C: 82.9 ml LV vol s, MOD A4C: 86.4 ml LV SV MOD A2C:     60.1 ml LV SV MOD A4C:     121.0 ml LV SV MOD BP:      48.4 ml RIGHT VENTRICLE RV Basal diam:  3.32 cm LEFT ATRIUM             Index       RIGHT ATRIUM           Index LA diam:        4.80 cm 2.05 cm/m  RA Area:     20.00 cm LA Vol (A2C):   49.3 ml 21.09 ml/m RA Volume:   57.00 ml  24.38 ml/m LA Vol (A4C):   50.3 ml 21.51 ml/m LA Biplane Vol: 51.1 ml 21.86 ml/m  AORTIC VALVE                   PULMONIC VALVE AV Area (Vmax):    2.10 cm    PV Vmax:        0.52 m/s AV Area (Vmean):   2.05 cm    PV Peak grad:   1.1 mmHg AV Area (VTI):     2.64 cm    RVOT Peak grad: 1 mmHg AV Vmax:           74.13 cm/s AV Vmean:          52.967 cm/s AV VTI:            0.100 m AV  Peak Grad:      2.2 mmHg AV Mean Grad:      1.3 mmHg LVOT Vmax:         45.00 cm/s LVOT Vmean:        31.400 cm/s LVOT  VTI:          0.076 m LVOT/AV VTI ratio: 0.76  AORTA Ao Root diam: 2.70 cm MITRAL VALVE               TRICUSPID VALVE MV Area (PHT): 5.97 cm    TR Peak grad:   11.6 mmHg MV Decel Time: 127 msec    TR Vmax:        170.00 cm/s MV E velocity: 61.70 cm/s MV A velocity: 80.60 cm/s  SHUNTS MV E/A ratio:  0.77        Systemic VTI:  0.08 m                            Systemic Diam: 2.10 cm [name] MD Electronically signed by [name] MD Signature Date/Time: 07/07/2020/2:44:37 PM    Final         Scheduled Meds: . amiodarone  400 mg Oral BID  . bisoprolol  5 mg Oral BID  . butamben-tetracaine-benzocaine      . Chlorhexidine Gluconate Cloth  6 each Topical Daily  . insulin aspart  0-5 Units Subcutaneous QHS  . insulin aspart  0-9 Units Subcutaneous TID WC  . ipratropium-albuterol  3 mL Nebulization Q6H  . lidocaine      . methylPREDNISolone (SOLU-MEDROL) injection  60 mg Intravenous Daily  . nicotine  14 mg Transdermal Daily  . sodium chloride flush       Continuous Infusions: . azithromycin Stopped (07/07/20 1433)  . cefTRIAXone (ROCEPHIN)  IV Stopped (07/07/20 1327)  . diltiazem (CARDIZEM) infusion 7.5 mg/hr (07/08/20 0021)  . heparin 1,700 Units/hr (07/08/20 0023)     LOS: 1 day    Time spent: 81 min    Desma Maxim, MD [facility]   If 7PM-7AM, please contact night-coverage [email or web address] Password [facility] 07/08/2020, 10:42 AM

## 2020-07-08 NOTE — Plan of Care (Signed)
Neuro: stable at baseline, no deficits Resp: stable on 2L [place] CV: afebrile, vital signs stable, heparin and diltiazem weaned off following PO medication initiation  GIGU: tolerating PO well, using urinal, last BM prior to admission Skin: clean dry and intact Social: Wife at the bedside throughout the day, all questions and concerns addressed.  Events: TEE with cardioversion-well tolerated no events or deviations  Problem: Education: Goal: Knowledge of General Education information will improve Description: Including pain rating scale, medication(s)/side effects and non-pharmacologic comfort measures Outcome: Progressing   Problem: Health Behavior/Discharge Planning: Goal: Ability to manage health-related needs will improve Outcome: Progressing   Problem: Clinical Measurements: Goal: Ability to maintain clinical measurements within normal limits will improve Outcome: Progressing Goal: Will remain free from infection Outcome: Progressing Goal: Diagnostic test results will improve Outcome: Progressing Goal: Respiratory complications will improve Outcome: Progressing Goal: Cardiovascular complication will be avoided Outcome: Progressing   Problem: Activity: Goal: Risk for activity intolerance will decrease Outcome: Progressing   Problem: Nutrition: Goal: Adequate nutrition will be maintained Outcome: Progressing   Problem: Coping: Goal: Level of anxiety will decrease Outcome: Progressing   Problem: Elimination: Goal: Will not experience complications related to bowel motility Outcome: Progressing Goal: Will not experience complications related to urinary retention Outcome: Progressing   Problem: Pain Managment: Goal: General experience of comfort will improve Outcome: Progressing   Problem: Safety: Goal: Ability to remain free from injury will improve Outcome: Progressing   Problem: Skin Integrity: Goal: Risk for impaired skin integrity will decrease Outcome:  Progressing

## 2020-07-08 NOTE — Progress Notes (Signed)
Transport arrived to transport patient for procedure. TEE.Placed on 3L [place] . Cardizem gtt @ 7.5 mg/hr and Heparin gtt @ 1700 Units/hour.

## 2020-07-08 NOTE — Progress Notes (Signed)
Transesophageal Echocardiogram :  Indication: cardiomyopathy, atrial fibrillation with RVR Requesting/ordering  physician: Wouk/Laronn Devonshire  Procedure: Benzocaine spray x2 and 2 mls x 2 of viscous lidocaine were given orally to provide local anesthesia to the oropharynx. The patient was positioned supine on the left side, bite block provided. The patient was moderately sedated with the doses of versed and fentanyl as detailed below.  Using digital technique an omniplane probe was advanced into the distal esophagus without incident.   Moderate sedation: 1. Sedation used:  Per amesthesia   See report in EPIC  for complete details: In brief, transgastric imaging revealed moderately depressed LV function with no RWMAs and no mural apical thrombus.    Estimated ejection fraction was 30%, global HK.  Right sided cardiac chambers were normal with no evidence of pulmonary hypertension.  Imaging of the septum showed no ASD or VSD Bubble study was negative for shunt 2D and color flow confirmed no PFO  The LA was well visualized in orthogonal views.  There was no spontaneous contrast and no thrombus in the LA and LA appendage  Moderately dilated left atrium  The descending thoracic aorta had mild diffuse   mural aortic debris with no evidence of aneurysmal dilation or disection   Tracy Gardner 07/08/2020 8:42 AM

## 2020-07-08 NOTE — Plan of Care (Signed)

## 2020-07-08 NOTE — Progress Notes (Signed)
Progress Note  Patient Name: Tracy Gardner Date of Encounter: 07/08/2020  [facility] Cardiologist: [name], MD   Subjective   Sputum production, PNA, sats stable on supplemental oxygen, 95% this Am ECHO with tachy mediated cardiomyopathy, unable to exclude ischemia On diltiazme infusion  Inpatient Medications    Scheduled Meds: . butamben-tetracaine-benzocaine      . [MAR Hold] Chlorhexidine Gluconate Cloth  6 each Topical Daily  . [MAR Hold] insulin aspart  0-5 Units Subcutaneous QHS  . [MAR Hold] insulin aspart  0-9 Units Subcutaneous TID WC  . [MAR Hold] ipratropium-albuterol  3 mL Nebulization Q6H  . lidocaine      . [MAR Hold] methylPREDNISolone (SOLU-MEDROL) injection  60 mg Intravenous Daily  . [MAR Hold] nicotine  14 mg Transdermal Daily  . sodium chloride flush       Continuous Infusions: . sodium chloride    . [MAR Hold] azithromycin Stopped (07/07/20 1433)  . [MAR Hold] cefTRIAXone (ROCEPHIN)  IV Stopped (07/07/20 1327)  . diltiazem (CARDIZEM) infusion 7.5 mg/hr (07/08/20 0021)  . heparin 1,700 Units/hr (07/08/20 0023)   PRN Meds: [MAR Hold] acetaminophen, [MAR Hold] melatonin, [MAR Hold] ondansetron (ZOFRAN) IV   Vital Signs    Vitals:   07/08/20 0400 07/08/20 0500 07/08/20 0600 07/08/20 0700  BP: 113/84 (!) 90/55 (!) 102/59 108/78  Pulse: (!) 107  (!) 48 89  Resp: (!) 22 20 16  (!) 24  Temp: 98.2 F (36.8 C)     TempSrc: Axillary     SpO2: 93% 95% 95% 95%  Weight:      Height:        Intake/Output Summary (Last 24 hours) at 07/08/2020 0744 Last data filed at 07/08/2020 0700 Gross per 24 hour  Intake 1285.43 ml  Output 700 ml  Net 585.43 ml   Last 3 Weights 07/07/2020 07/07/2020 06/03/2020  Weight (lbs) 251 lb 15.8 oz 255 lb 254 lb  Weight (kg) 114.3 kg 115.667 kg 115.214 kg      Telemetry    Atrial fib rate 100 bpm - Personally Reviewed  ECG     - Personally Reviewed  Physical Exam   GEN: No acute distress.   Neck: No  JVD Cardiac: RRR, no murmurs, rubs, or gallops.  Respiratory: Clear to auscultation bilaterally. GI: Soft, nontender, non-distended  MS: No edema; No deformity. Neuro:  Nonfocal  Psych: Normal affect   Labs    High Sensitivity Troponin:   Recent Labs  Lab 07/07/20 1010 07/07/20 1226  TROPONINIHS 17 16      Chemistry Recent Labs  Lab 07/07/20 1010 07/07/20 1226 07/08/20 0536  NA 136  --  134*  K 4.3  --  4.9  CL 101  --  103  CO2 26  --  23  GLUCOSE 110*  --  169*  BUN 17  --  23*  CREATININE 1.08 1.13 1.12  CALCIUM 8.8*  --  8.4*  PROT 7.2  --   --   ALBUMIN 3.5  --   --   AST 18  --   --   ALT 19  --   --   ALKPHOS 65  --   --   BILITOT 1.2  --   --   GFRNONAA >60 >60 >60  ANIONGAP 9  --  8     Hematology Recent Labs  Lab 07/07/20 1010 07/08/20 0536  WBC 11.2* 14.1*  RBC 6.10* 5.50  HGB 17.0 15.4  HCT 53.4* 48.7  MCV 87.5 88.5  MCH 27.9 28.0  MCHC 31.8 31.6  RDW 14.4 14.4  PLT 203 174    BNP Recent Labs  Lab 07/07/20 1010  BNP 240.2*     DDimer No results for input(s): DDIMER in the last 168 hours.   Radiology    CT Angio Chest PE W and/or Wo Contrast  Result Date: 07/07/2020 CLINICAL DATA:  Shortness of breath.  Atrial fibrillation. EXAM: CT ANGIOGRAPHY CHEST WITH CONTRAST TECHNIQUE: Multidetector CT imaging of the chest was performed using the standard protocol during bolus administration of intravenous contrast. Multiplanar CT image reconstructions and MIPs were obtained to evaluate the vascular anatomy. CONTRAST:  58mL OMNIPAQUE IOHEXOL 350 MG/ML SOLN COMPARISON:  Chest radiography same day FINDINGS: Cardiovascular: Heart size upper limits of normal. Relatively mild coronary artery calcification seems to be present. Minimal aortic atherosclerotic calcification. Pulmonary arterial opacification is excellent. No pulmonary emboli. Mediastinum/Nodes: No mass or lymphadenopathy. Lungs/Pleura: No pulmonary edema or pleural effusion. The right lung  is clear. There is mild hazy and patchy infiltrate in the left upper lobe and lingula consistent with bronchopneumonia. No dense consolidation, collapse or effusion. 4 mm subpleural nodule in the left lower lobe axial image 71. Upper Abdomen: Negative Musculoskeletal: Ordinary degenerative changes affect the thoracic spine. Chronic calcified disc herniation at T12-L1. Review of the MIP images confirms the above findings. IMPRESSION: 1. No pulmonary emboli. 2. Mild hazy and patchy infiltrate in the left upper lobe and lingula consistent with bronchopneumonia. No dense consolidation, collapse or effusion. 3. 4 mm subpleural nodule in the left lower lobe. This does not require further follow-up. 4. Mild coronary artery calcification and very minimal aortic atherosclerotic calcification. Electronically Signed   By: Paulina Fusi M.D.   On: 07/07/2020 11:19   DG Chest Port 1 View  Result Date: 07/07/2020 CLINICAL DATA:  Shortness of breath.  Atrial fibrillation. EXAM: PORTABLE CHEST 1 VIEW COMPARISON:  01/10/2018. FINDINGS: Mild cardiac enlargement. Pulmonary vascular congestion identified. No pleural effusions. No airspace opacification. Visualized osseous structures are unremarkable. IMPRESSION: Cardiac enlargement and pulmonary vascular congestion. Electronically Signed   By: Signa Kell M.D.   On: 07/07/2020 10:46   ECHOCARDIOGRAM COMPLETE  Result Date: 07/07/2020    ECHOCARDIOGRAM REPORT   Patient Name:   Tracy Gardner Date of Exam: 07/07/2020 Medical Rec #:  [id number]    Height:       71.0 in Accession #:    [id number]   Weight:       255.0 lb Date of Birth:  12-10-1965     BSA:          2.338 m Patient Age:    55 years     BP:           100/66 mmHg Patient Gender: M            HR:           100 bpm. Exam Location:  [facility] Procedure: 2D Echo, Color Doppler and Cardiac Doppler Indications:     Syncope R55  History:         Patient has no prior history of Echocardiogram examinations. No                  heart  history listed in chart.  Sonographer:     Cristela Blue RDCS (AE) Referring Phys:  [id number] Oliver Pila HALL Diagnosing Phys: Julien Nordmann MD IMPRESSIONS  1. Left ventricular ejection fraction, by estimation, is 35 to 40%. The left  ventricle has moderately decreased function. The left ventricle demonstrates global hypokinesis. The left ventricular internal cavity size was mildly dilated. Left ventricular diastolic parameters are indeterminate.  2. Right ventricular systolic function is normal. The right ventricular size is normal. There is normal pulmonary artery systolic pressure.  3. Left atrial size was mildly dilated. FINDINGS  Left Ventricle: Left ventricular ejection fraction, by estimation, is 35 to 40%. The left ventricle has moderately decreased function. The left ventricle demonstrates global hypokinesis. The left ventricular internal cavity size was mildly dilated. There is no left ventricular hypertrophy. Left ventricular diastolic parameters are indeterminate. Right Ventricle: The right ventricular size is normal. No increase in right ventricular wall thickness. Right ventricular systolic function is normal. There is normal pulmonary artery systolic pressure. The tricuspid regurgitant velocity is 1.70 m/s, and  with an assumed right atrial pressure of 5 mmHg, the estimated right ventricular systolic pressure is 85.2 mmHg. Left Atrium: Left atrial size was mildly dilated. Right Atrium: Right atrial size was normal in size. Pericardium: There is no evidence of pericardial effusion. Mitral Valve: The mitral valve is normal in structure. No evidence of mitral valve regurgitation. No evidence of mitral valve stenosis. Tricuspid Valve: The tricuspid valve is normal in structure. Tricuspid valve regurgitation is not demonstrated. No evidence of tricuspid stenosis. Aortic Valve: The aortic valve was not well visualized. Aortic valve regurgitation is not visualized. No aortic stenosis is present. Aortic valve mean  gradient measures 1.3 mmHg. Aortic valve peak gradient measures 2.2 mmHg. Aortic valve area, by VTI measures 2.64 cm. Pulmonic Valve: The pulmonic valve was normal in structure. Pulmonic valve regurgitation is not visualized. No evidence of pulmonic stenosis. Aorta: The aortic root is normal in size and structure. Venous: The inferior vena cava is normal in size with greater than 50% respiratory variability, suggesting right atrial pressure of 3 mmHg. IAS/Shunts: No atrial level shunt detected by color flow Doppler.  LEFT VENTRICLE PLAX 2D LVIDd:         5.91 cm      Diastology LVIDs:         4.75 cm      LV e' medial:   8.92 cm/s LV PW:         1.05 cm      LV E/e' medial: 6.9 LV IVS:        0.85 cm LVOT diam:     2.10 cm LV SV:         26 LV SV Index:   11 LVOT Area:     3.46 cm  LV Volumes (MOD) LV vol d, MOD A2C: 143.0 ml LV vol d, MOD A4C: 121.0 ml LV vol s, MOD A2C: 82.9 ml LV vol s, MOD A4C: 86.4 ml LV SV MOD A2C:     60.1 ml LV SV MOD A4C:     121.0 ml LV SV MOD BP:      48.4 ml RIGHT VENTRICLE RV Basal diam:  3.32 cm LEFT ATRIUM             Index       RIGHT ATRIUM           Index LA diam:        4.80 cm 2.05 cm/m  RA Area:     20.00 cm LA Vol (A2C):   49.3 ml 21.09 ml/m RA Volume:   57.00 ml  24.38 ml/m LA Vol (A4C):   50.3 ml 21.51 ml/m LA Biplane Vol: 51.1 ml 21.86 ml/m  AORTIC VALVE                   PULMONIC VALVE AV Area (Vmax):    2.10 cm    PV Vmax:        0.52 m/s AV Area (Vmean):   2.05 cm    PV Peak grad:   1.1 mmHg AV Area (VTI):     2.64 cm    RVOT Peak grad: 1 mmHg AV Vmax:           74.13 cm/s AV Vmean:          52.967 cm/s AV VTI:            0.100 m AV Peak Grad:      2.2 mmHg AV Mean Grad:      1.3 mmHg LVOT Vmax:         45.00 cm/s LVOT Vmean:        31.400 cm/s LVOT VTI:          0.076 m LVOT/AV VTI ratio: 0.76  AORTA Ao Root diam: 2.70 cm MITRAL VALVE               TRICUSPID VALVE MV Area (PHT): 5.97 cm    TR Peak grad:   11.6 mmHg MV Decel Time: 127 msec    TR Vmax:         170.00 cm/s MV E velocity: 61.70 cm/s MV A velocity: 80.60 cm/s  SHUNTS MV E/A ratio:  0.77        Systemic VTI:  0.08 m                            Systemic Diam: 2.10 cm [name] MD Electronically signed by [name] MD Signature Date/Time: 07/07/2020/2:44:37 PM    Final     Cardiac Studies   Echo  1. Left ventricular ejection fraction, by estimation, is 35 to 40%. The  left ventricle has moderately decreased function. The left ventricle  demonstrates global hypokinesis. The left ventricular internal cavity size  was mildly dilated. Left ventricular  diastolic parameters are indeterminate.  2. Right ventricular systolic function is normal. The right ventricular  size is normal. There is normal pulmonary artery systolic pressure.  3. Left atrial size was mildly dilated.   Patient Profile      Tracy Gardner is a 55 year old gentleman with past medical history of smoking, polycythemia, hypertension, hyperlipidemia, borderline diabetes presenting with acute respiratory distress, atrial fibrillation with RVR    Assessment & Plan   Atrial fibrillation with RVR  Cardiomyopathy, atrial fib likely contributing will make every attempt to get back to NSR  Benefit of TEE/cardioversion and restoring NSR outweighs risk -sats somewhat better, cleared significant sputum out o/n per the patient Procedure this Am, case discussed with anesthesia   Bronchopneumonia  Documented on CT scan, symptoms for the past week, thick productive cough  On antibiotics , Nebs  Restore NSR to assist with breathing, treat cardiomyopathy   Smoker  Has patch in place, Cessation recommended     Cardiomyopathy  Likely tachycardia mediated though unable to exclude underlying ischemia or cardiomyopathy from other etiology  limited echo once recovered and matining NSR   Diabetes type 2  A1c of 7   lifestyle modification has been recommended    Total encounter time more than 35 minutes  Greater  than 50% was spent in counseling and coordination of care with the patient  For questions or updates, please contact [place] Please consult [email or web address] for contact info under        Signed, [name], MD  07/08/2020, 7:44 AM

## 2020-07-08 NOTE — Progress Notes (Signed)
   07/08/20 1040  Clinical Encounter Type  Visited With Patient and family together  Visit Type Initial;Spiritual support;Social support  Referral From Chaplain  Consult/Referral To [place] visited PT while doing rounds. Chaplain recognized his wife, who is a Marine scientist here at this hospital. PT was able to express his gratefulness to the staff, his wife and God.  PT stated, "he just went for annual checkup when they noticed a irregular heartbeat." Chaplain ministered with spiritual support, emotional support, and prayer.
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
## 2020-07-08 NOTE — Discharge Instructions (Signed)
Information on my medicine - ELIQUIS (apixaban)  WHY WAS ELIQUIS PRESCRIBED FOR YOU? Eliquis was prescribed for you to reduce the risk of a blood clot forming that can cause a stroke if you have a medical condition called atrial fibrillation (a type of irregular heartbeat).  WHAT DO YOU NEED TO KNOW ABOUT ELIQUIS ? Take your Eliquis TWICE DAILY - one tablet in the morning and one tablet in the evening with or without food. If you have difficulty swallowing the tablet whole please discuss with your pharmacist how to take the medication safely. Take Eliquis exactly as prescribed by your doctor and DO NOT stop taking Eliquis without talking to the doctor who prescribed the medication. Stopping may increase your risk of developing a stroke. Refill your prescription before you run out. After discharge, you should have regular check-up appointments with your healthcare provider that is prescribing your Eliquis. In the future your dose may need to be changed if your kidney function or weight changes by a significant amount or as you get older.  WHAT DO YOU DO IF YOU MISS A DOSE? If you miss a dose, take it as soon as you remember on the same day and resume taking twice daily. Do not take more than one dose of ELIQUIS at the same time to make up a missed dose.  IMPORTANT SAFETY INFORMATION A possible side effect of Eliquis is bleeding. You should call your healthcare provider right away if you experience any of the following: ? Bleeding from an injury or your nose that does not stop. ? Unusual colored urine (red or dark brown) or unusual colored stools (red or black). ? Unusual bruising for unknown reasons. ? A serious fall or if you hit your head (even if there is no bleeding). Some medicines may interact with Eliquis and might increase your risk of bleeding or clotting while on Eliquis. To help avoid this, consult your healthcare provider or pharmacist prior to using any new prescription or  non-prescription medications, including herbals, vitamins, non-steroidal anti-inflammatory drugs (NSAIDs) and supplements.  This website has more information on Eliquis (apixaban): [email or web address].no.

## 2020-07-08 NOTE — Progress Notes (Signed)
*  PRELIMINARY RESULTS* Echocardiogram Echocardiogram Transesophageal has been performed.  Sherrie Sport 07/08/2020, 8:28 AM

## 2020-07-08 NOTE — Anesthesia Procedure Notes (Signed)
Date/Time: 07/08/2020 8:01 AM Performed by: Lily Peer, Caylor Cerino, CRNA Pre-anesthesia Checklist: Patient identified, Emergency Drugs available, Suction available, Patient being monitored and Timeout performed Patient Re-evaluated:Patient Re-evaluated prior to induction Oxygen Delivery Method: Simple face mask Induction Type: IV induction

## 2020-07-08 NOTE — Progress Notes (Signed)
Patient to floor from ICU, room 237.

## 2020-07-08 NOTE — Progress Notes (Signed)
TEE cardioversion completed Normal sinus rhythm restored High risk of recurrent arrhythmia given dilated left atrium, cardiomyopathy, continued respiratory distress with hypoxia, pneumonia, sleep apnea -Given suspected tachycardia mediated cardiomyopathy, will adjust medications in effort to maintain normal sinus rhythm -We will start oral amiodarone 400 twice daily x10 doses then down to 200 twice daily, -Given cardiomyopathy will wean off diltiazem infusion start bisoprolol 5 twice daily -For recurrent atrial fibrillation could bolus amiodarone with load and infusion  Signed, Esmond Plants, MD, Ph.D [facility]

## 2020-07-08 NOTE — Anesthesia Postprocedure Evaluation (Signed)
Anesthesia Post Note  Patient: Jance Siek Bartelt  Procedure(s) Performed: TRANSESOPHAGEAL ECHOCARDIOGRAM (TEE) (N/A )  Patient location during evaluation: Specials Recovery Anesthesia Type: General Level of consciousness: awake and alert Pain management: pain level controlled Vital Signs Assessment: post-procedure vital signs reviewed and stable Respiratory status: spontaneous breathing, nonlabored ventilation, respiratory function stable and patient connected to nasal cannula oxygen Cardiovascular status: blood pressure returned to baseline and stable Postop Assessment: no apparent nausea or vomiting Anesthetic complications: no   No complications documented.   Last Vitals:  Vitals:   07/08/20 0900 07/08/20 0910  BP: (!) 89/74 (!) 87/62  Pulse: 68 71  Resp: (!) 21 (!) 22  Temp:    SpO2: 92% 94%    Last Pain:  Vitals:   07/08/20 0910  TempSrc:   PainSc: Asleep                 Arita Miss

## 2020-07-08 NOTE — Transfer of Care (Signed)
Immediate Anesthesia Transfer of Care Note  Patient: Tracy Gardner  Procedure(s) Performed: TRANSESOPHAGEAL ECHOCARDIOGRAM (TEE) (N/A )  Patient Location: Cath Lab  Anesthesia Type:General  Level of Consciousness: awake, alert  and oriented  Airway & Oxygen Therapy: Patient Spontanous Breathing and Patient connected to nasal cannula oxygen  Post-op Assessment: Report given to RN and Post -op Vital signs reviewed and stable  Post vital signs: Reviewed and stable  Last Vitals:  Vitals Value Taken Time  BP 96/70 07/08/20 0836  Temp    Pulse 76 07/08/20 0839  Resp 20 07/08/20 0839  SpO2 95 % 07/08/20 0839  Vitals shown include unvalidated device data.  Last Pain:  Vitals:   07/08/20 0747  TempSrc: Oral  PainSc: 0-No pain         Complications: No complications documented.

## 2020-07-08 NOTE — CV Procedure (Signed)
Cardioversion procedure note For atrial fibrillation with RVR, cardiomyopathy  Procedure Details:  Consent: Risks of procedure as well as the alternatives and risks of each were explained to the (patient/caregiver). Consent for procedure obtained.  Time Out: Verified patient identification, verified procedure, site/side was marked, verified correct patient position, special equipment/implants available, medications/allergies/relevent history reviewed, required imaging and test results available. Performed  Patient placed on cardiac monitor, pulse oximetry, supplemental oxygen as necessary.  Sedation given: propofol IV, Dr. Bertell Maria Pacer pads placed anterior and posterior chest.   Cardioverted 1 time(s).  Cardioverted at  150 J. Synchronized biphasic Converted to NSR   Evaluation: Findings: Post procedure EKG shows: NSR Complications: None Patient did tolerate procedure well.  Time Spent Directly with the Patient:  44 minutes   Esmond Plants, M.D., Ph.D.

## 2020-07-09 DIAGNOSIS — I4891 Unspecified atrial fibrillation: Secondary | ICD-10-CM | POA: Diagnosis not present

## 2020-07-09 DIAGNOSIS — I429 Cardiomyopathy, unspecified: Secondary | ICD-10-CM | POA: Diagnosis not present

## 2020-07-09 LAB — CBC
HCT: 49.2 % (ref 39.0–52.0)
Hemoglobin: 15.5 g/dL (ref 13.0–17.0)
MCH: 28.1 pg (ref 26.0–34.0)
MCHC: 31.5 g/dL (ref 30.0–36.0)
MCV: 89.1 fL (ref 80.0–100.0)
Platelets: 203 10*3/uL (ref 150–400)
RBC: 5.52 MIL/uL (ref 4.22–5.81)
RDW: 14.8 % (ref 11.5–15.5)
WBC: 18.8 10*3/uL — ABNORMAL HIGH (ref 4.0–10.5)
nRBC: 0 % (ref 0.0–0.2)

## 2020-07-09 LAB — BASIC METABOLIC PANEL
Anion gap: 9 (ref 5–15)
BUN: 35 mg/dL — ABNORMAL HIGH (ref 6–20)
CO2: 23 mmol/L (ref 22–32)
Calcium: 8.6 mg/dL — ABNORMAL LOW (ref 8.9–10.3)
Chloride: 103 mmol/L (ref 98–111)
Creatinine, Ser: 1.51 mg/dL — ABNORMAL HIGH (ref 0.61–1.24)
GFR, Estimated: 54 mL/min — ABNORMAL LOW (ref >60)
Glucose, Bld: 130 mg/dL — ABNORMAL HIGH (ref 70–99)
Potassium: 4.8 mmol/L (ref 3.5–5.1)
Sodium: 135 mmol/L (ref 135–145)

## 2020-07-09 LAB — GLUCOSE, CAPILLARY
Glucose-Capillary: 104 mg/dL — ABNORMAL HIGH (ref 70–99)
Glucose-Capillary: 146 mg/dL — ABNORMAL HIGH (ref 70–99)
Glucose-Capillary: 164 mg/dL — ABNORMAL HIGH (ref 70–99)
Glucose-Capillary: 203 mg/dL — ABNORMAL HIGH (ref 70–99)

## 2020-07-09 NOTE — Progress Notes (Signed)
PROGRESS NOTE    Tracy Gardner  IFO:[id number] DOB: 07/18/1965 DOA: 07/07/2020 PCP: [name], PA-C  Outpatient Specialists: heme/onc    Brief Narrative:   Tracy Gardner is a 55 y.o. male with medical history significant for asthma, tobacco use disorder, erythrocytosis, type 2 diabetes, essential hypertension, hyperlipidemia who presented to [facility] ED from his PCPs office after being advised to present to the ED due to new onset A. fib with RVR.  Patient reports for the last week he has had a productive cough with brownish, greenish sputum and shortness of breath with minimal exertion.  No chest pain.  No fever.  Thought they were allergies symptoms.  Ongoing tobacco use, half a pack a day.  His cough has been more productive than his usual chronic smoker's cough.  No lower extremity edema or tenderness.  He went to his primary care provider today for a regular checkup after recently started on metformin for type 2 diabetes.  While he was there vital signs were abnormal, his heart rate was in the 150s.  Twelve-lead EKG revealed A. fib with RVR.  Recommended to come to the ED for further evaluation.  A. fib was confirmed.  Hypoxic on exam requiring 2 to 3 L nasal cannula.  Due to concern for PE he had a CTA chest which was negative for PE but showed bronchial pneumonia.  Started on IV antibiotics empirically in the ED.  Also received a dose of IV Solu-Medrol 125 mg x 1 due to wheezing likely from asthma exacerbation.  EDP called for admission.  ED Course:  Afebrile.  BP 104/79, pulse 120, respiratory rate 32, O2 saturation 94% on 3 L.  Lab studies remarkable for BNP 240, troponin 17, normal.  WBC 11.2.  Twelve-lead EKG A. fib with rate of 148, nonspecific ST-T changes.  QTc 473.   Assessment & Plan:   Principal Problem:   New onset atrial fibrillation (HCC) Active Problems:   Dilated cardiomyopathy ([place])   Bronchial pneumonia   Smoker   Diabetes mellitus type 2 with complications  ([place])  New onset A. fib with RVR HFrEF, dilated cardiomyopathy Heart rate in the 150s in the ED, twelve-lead EKG revealed A. fib with RVR, started on diltiazem drip. CHA2DS2-VASc score 2, no history of head trauma or GI bleed. EF 35-40 on TTE. TEE cardioversion performed 4/29, successful. At risk for recurrence. tsh wnl. No PE on cta. Asthma exacerbation/pna likely contributory, though possible this is longstanding. euvolemic - cards following - amio started - cont bisoprolol - eliquis started, monitor kidney function  Acute hypoxic respiratory failure secondary to bronchial pneumonia and acute asthma exacerbation. Not on oxygen supplementation at baseline. Still wheezing but symptomatically improved. Currently requiring 3 L to maintain a saturation greater than 90%. - Continue IV steroids, bronchodilators, incentive spirometer. - Wean off oxygen supplementation as tolerated.  Bronchial pneumonia, POA Presented with 1 week of dyspnea and productive cough. Much improved - sputum culture ngtd - cont ceftriaxone/azithromycin - wean o2 as able.  Type 2 diabetes with hyperglycemia Recently started on metformin. a1c 6.5 - ssi - consider sglt2i if kidney function hold  AKI Cr 1.5 today from baseline 1.1. did receive contrast, not otherwise on nephrotoxins. Tolerating po - monitor  Tobacco use disorder Reports he has cut down to half a pack a day from 1 pack a day since the age of 24. Patient has received tobacco cessation counseling at bedside Nicotine patch as needed.  Essential hypertension BP is currently  at goal - bisoprolol as above - holding enalapril, re-start if kidney function improves  Hyperlipidemia Resume home regimen  Suspected OSA Made pt aware likely contributes to current condition, needs outpatient sleep study   DVT prophylaxis: eliquis Code Status: full Family Communication: wife updated bedside 4/30, she is a nurse at our hospital  Level of care:  Progressive Cardiac Status is: Inpatient  Remains inpatient appropriate because:Inpatient level of care appropriate due to severity of illness   Dispo:  Patient From: Home  Planned Disposition: Home  Medically stable for discharge: No       Consultants:  cardiology  Procedures: TEE cardioversion 4/29  Antimicrobials:  Azithromycin/ceftriaxone 4/28>    Subjective: This morning feeling well. No dyspnea, is wheezing. Has appetite. Breathing much improved.  Objective: Vitals:   07/08/20 2218 07/09/20 0642 07/09/20 0915 07/09/20 1140  BP: 110/65 102/60 115/83 117/68  Pulse: (!) 42 63 72 75  Resp: 20 18 18 18   Temp: (!) 97.5 F (36.4 C) 97.8 F (36.6 C) 97.8 F (36.6 C) 97.6 F (36.4 C)  TempSrc: Oral Oral    SpO2: 96% 97% 97% 94%  Weight: 116.8 kg     Height:        Intake/Output Summary (Last 24 hours) at 07/09/2020 1242 Last data filed at 07/09/2020 1004 Gross per 24 hour  Intake 590 ml  Output --  Net 590 ml   Filed Weights   07/07/20 1006 07/07/20 1451 07/08/20 2218  Weight: 115.7 kg 114.3 kg 116.8 kg    Examination:  General exam: Appears calm and comfortable  Respiratory system: exp wheeze throughout. Respiratory effort normal. Cardiovascular system: S1 & S2 heard, RRR. Soft systolic murmur Gastrointestinal system: Abdomen is obese, soft and nontender. No organomegaly or masses felt. Normal bowel sounds heard. Central nervous system: Alert and oriented. No focal neurological deficits. Extremities: Symmetric 5 x 5 power. Skin: No rashes, lesions or ulcers Psychiatry: Judgement and insight appear normal. Mood & affect appropriate.     Data Reviewed: I have personally reviewed following labs and imaging studies  CBC: Recent Labs  Lab 07/07/20 1010 07/08/20 0536 07/09/20 0410  WBC 11.2* 14.1* 18.8*  HGB 17.0 15.4 15.5  HCT 53.4* 48.7 49.2  MCV 87.5 88.5 89.1  PLT 203 174 [id number]   Basic Metabolic Panel: Recent Labs  Lab 07/07/20 1010  07/07/20 1226 07/08/20 0536 07/09/20 0410  NA 136  --  134* 135  K 4.3  --  4.9 4.8  CL 101  --  103 103  CO2 26  --  23 23  GLUCOSE 110*  --  169* 130*  BUN 17  --  23* 35*  CREATININE 1.08 1.13 1.12 1.51*  CALCIUM 8.8*  --  8.4* 8.6*  MG 1.6*  --  1.7  --   PHOS  --   --  4.9*  --    GFR: Estimated Creatinine Clearance: 71.8 mL/min (A) (by C-G formula based on SCr of 1.51 mg/dL (H)). Liver Function Tests: Recent Labs  Lab 07/07/20 1010  AST 18  ALT 19  ALKPHOS 65  BILITOT 1.2  PROT 7.2  ALBUMIN 3.5   No results for input(s): LIPASE, AMYLASE in the last 168 hours. No results for input(s): AMMONIA in the last 168 hours. Coagulation Profile: Recent Labs  Lab 07/07/20 1601  INR 1.0   Cardiac Enzymes: No results for input(s): CKTOTAL, CKMB, CKMBINDEX, TROPONINI in the last 168 hours. BNP (last 3 results) No results for input(s): PROBNP in  the last 8760 hours. HbA1C: Recent Labs    07/07/20 1450  HGBA1C 6.5*   CBG: Recent Labs  Lab 07/08/20 1136 07/08/20 1553 07/08/20 2116 07/09/20 0814 07/09/20 1137  GLUCAP 171* 181* 188* 104* 146*   Lipid Profile: Recent Labs    07/07/20 1450  CHOL 174  HDL 30*  LDLCALC 119*  TRIG 127  CHOLHDL 5.8   Thyroid Function Tests: Recent Labs    07/07/20 1010  TSH 3.185  FREET4 1.15*   Anemia Panel: No results for input(s): VITAMINB12, FOLATE, FERRITIN, TIBC, IRON, RETICCTPCT in the last 72 hours. Urine analysis:    Component Value Date/Time   COLORURINE Yellow 01/24/2013 1607   APPEARANCEUR Clear 01/24/2013 1607   LABSPEC 1.033 01/24/2013 1607   PHURINE 5.0 01/24/2013 1607   GLUCOSEU Negative 01/24/2013 1607   HGBUR Negative 01/24/2013 1607   BILIRUBINUR Negative 01/24/2013 1607   KETONESUR Negative 01/24/2013 1607   PROTEINUR >=500 01/24/2013 1607   NITRITE Negative 01/24/2013 1607   LEUKOCYTESUR Negative 01/24/2013 1607   Sepsis Labs: @LABRCNTIP (procalcitonin:4,lacticidven:4)  ) Recent Results  (from the past 240 hour(s))  Resp Panel by RT-PCR (Flu A&B, Covid) Nasopharyngeal Swab     Status: None   Collection Time: 07/07/20 10:11 AM   Specimen: Nasopharyngeal Swab; Nasopharyngeal(NP) swabs in vial transport medium  Result Value Ref Range Status   SARS Coronavirus 2 by RT PCR NEGATIVE NEGATIVE Final    Comment: (NOTE) SARS-CoV-2 target nucleic acids are NOT DETECTED.  The SARS-CoV-2 RNA is generally detectable in upper respiratory specimens during the acute phase of infection. The lowest concentration of SARS-CoV-2 viral copies this assay can detect is 138 copies/mL. A negative result does not preclude SARS-Cov-2 infection and should not be used as the sole basis for treatment or other patient management decisions. A negative result may occur with  improper specimen collection/handling, submission of specimen other than nasopharyngeal swab, presence of viral mutation(s) within the areas targeted by this assay, and inadequate number of viral copies(<138 copies/mL). A negative result must be combined with clinical observations, patient history, and epidemiological information. The expected result is Negative.  Fact Sheet for Patients:  EntrepreneurPulse.com.au  Fact Sheet for Healthcare Providers:  IncredibleEmployment.be  This test is no t yet approved or cleared by the [place] FDA and  has been authorized for detection and/or diagnosis of SARS-CoV-2 by FDA under an Emergency Use Authorization (EUA). This EUA will remain  in effect (meaning this test can be used) for the duration of the COVID-19 declaration under Section 564(b)(1) of the Act, 21 U.S.C.section 360bbb-3(b)(1), unless the authorization is terminated  or revoked sooner.       Influenza A by PCR NEGATIVE NEGATIVE Final   Influenza B by PCR NEGATIVE NEGATIVE Final    Comment: (NOTE) The Xpert Xpress SARS-CoV-2/FLU/RSV plus assay is intended as an aid in the  diagnosis of influenza from Nasopharyngeal swab specimens and should not be used as a sole basis for treatment. Nasal washings and aspirates are unacceptable for Xpert Xpress SARS-CoV-2/FLU/RSV testing.  Fact Sheet for Patients: EntrepreneurPulse.com.au  Fact Sheet for Healthcare Providers: IncredibleEmployment.be  This test is not yet approved or cleared by the [place] FDA and has been authorized for detection and/or diagnosis of SARS-CoV-2 by FDA under an Emergency Use Authorization (EUA). This EUA will remain in effect (meaning this test can be used) for the duration of the COVID-19 declaration under Section 564(b)(1) of the Act, 21 U.S.C. section 360bbb-3(b)(1), unless the authorization is terminated or  revoked.  Performed at [facility], [address] [place], [place] [address]   Blood culture (routine x 2)     Status: None (Preliminary result)   Collection Time: 07/07/20 12:25 PM   Specimen: BLOOD  Result Value Ref Range Status   Specimen Description BLOOD RIGHT ANTECUBITAL  Final   Special Requests   Final    BOTTLES DRAWN AEROBIC AND ANAEROBIC Blood Culture adequate volume   Culture   Final    NO GROWTH 2 DAYS Performed at [facility], [address] [place], [place] [address]    Report Status PENDING  Incomplete  Blood culture (routine x 2)     Status: None (Preliminary result)   Collection Time: 07/07/20 12:25 PM   Specimen: BLOOD  Result Value Ref Range Status   Specimen Description BLOOD BLOOD RIGHT HAND  Final   Special Requests   Final    BOTTLES DRAWN AEROBIC AND ANAEROBIC Blood Culture adequate volume   Culture   Final    NO GROWTH 2 DAYS Performed at [facility], [address], [place], [place] [address]    Report Status PENDING  Incomplete  Expectorated Sputum Assessment w Gram Stain, Rflx to Resp Cult     Status: None   Collection Time: 07/07/20 12:28 PM   Specimen:  Expectorated Sputum  Result Value Ref Range Status   Specimen Description EXPECTORATED SPUTUM  Final   Special Requests Normal  Final   Sputum evaluation   Final    THIS SPECIMEN IS ACCEPTABLE FOR SPUTUM CULTURE Performed at [facility], [address] [place], [place] [address]    Report Status 07/07/2020 FINAL  Final  Culture, Respiratory w Gram Stain     Status: None (Preliminary result)   Collection Time: 07/07/20 12:28 PM  Result Value Ref Range Status   Specimen Description   Final    EXPECTORATED SPUTUM Performed at [facility], [address], [place], [place] [address]    Special Requests   Final    Normal Reflexed from [phone] Performed at [facility], [address] [place], [place] [address]    Gram Stain   Final    ABUNDANT WBC PRESENT, PREDOMINANTLY PMN ABUNDANT GRAM NEGATIVE RODS FEW GRAM POSITIVE COCCI    Culture   Final    FEW Normal respiratory flora-no Staph aureus or Pseudomonas seen Performed at [facility], [place] [address], [place], [place] [address]    Report Status PENDING  Incomplete  MRSA PCR Screening     Status: None   Collection Time: 07/07/20  2:54 PM   Specimen: Nasopharyngeal  Result Value Ref Range Status   MRSA by PCR NEGATIVE NEGATIVE Final    Comment:        The GeneXpert MRSA Assay (FDA approved for NASAL specimens only), is one component of a comprehensive MRSA colonization surveillance program. It is not intended to diagnose MRSA infection nor to guide or monitor treatment for MRSA infections. Performed at [facility], [address] [place], [place] [address]          Radiology Studies: ECHOCARDIOGRAM COMPLETE  Result Date: 07/07/2020    ECHOCARDIOGRAM REPORT   Patient Name:   RENAUD CELLI Date of Exam: 07/07/2020 Medical Rec #:  [id number]    Height:       71.0 in Accession #:    [id number]   Weight:       255.0 lb Date of Birth:  [birth year]     BSA:          2.338 m  Patient Age:    29 years     BP:           100/66 mmHg Patient Gender: M            HR:           100 bpm. Exam Location:  [facility] Procedure: 2D Echo, Color Doppler and Cardiac Doppler Indications:     Syncope R55  History:         Patient has no prior history of Echocardiogram examinations. No                  heart history listed in chart.  Sonographer:     Sherrie Sport RDCS (AE) Referring Phys:  [id number] [place] Diagnosing Phys: [name] MD IMPRESSIONS  1. Left ventricular ejection fraction, by estimation, is 35 to 40%. The left ventricle has moderately decreased function. The left ventricle demonstrates global hypokinesis. The left ventricular internal cavity size was mildly dilated. Left ventricular diastolic parameters are indeterminate.  2. Right ventricular systolic function is normal. The right ventricular size is normal. There is normal pulmonary artery systolic pressure.  3. Left atrial size was mildly dilated. FINDINGS  Left Ventricle: Left ventricular ejection fraction, by estimation, is 35 to 40%. The left ventricle has moderately decreased function. The left ventricle demonstrates global hypokinesis. The left ventricular internal cavity size was mildly dilated. There is no left ventricular hypertrophy. Left ventricular diastolic parameters are indeterminate. Right Ventricle: The right ventricular size is normal. No increase in right ventricular wall thickness. Right ventricular systolic function is normal. There is normal pulmonary artery systolic pressure. The tricuspid regurgitant velocity is 1.70 m/s, and  with an assumed right atrial pressure of 5 mmHg, the estimated right ventricular systolic pressure is [id number] mmHg. Left Atrium: Left atrial size was mildly dilated. Right Atrium: Right atrial size was normal in size. Pericardium: There is no evidence of pericardial effusion. Mitral Valve: The mitral valve is normal in structure. No evidence of mitral valve regurgitation. No evidence of mitral  valve stenosis. Tricuspid Valve: The tricuspid valve is normal in structure. Tricuspid valve regurgitation is not demonstrated. No evidence of tricuspid stenosis. Aortic Valve: The aortic valve was not well visualized. Aortic valve regurgitation is not visualized. No aortic stenosis is present. Aortic valve mean gradient measures 1.3 mmHg. Aortic valve peak gradient measures 2.2 mmHg. Aortic valve area, by VTI measures 2.64 cm. Pulmonic Valve: The pulmonic valve was normal in structure. Pulmonic valve regurgitation is not visualized. No evidence of pulmonic stenosis. Aorta: The aortic root is normal in size and structure. Venous: The inferior vena cava is normal in size with greater than 50% respiratory variability, suggesting right atrial pressure of 3 mmHg. IAS/Shunts: No atrial level shunt detected by color flow Doppler.  LEFT VENTRICLE PLAX 2D LVIDd:         5.91 cm      Diastology LVIDs:         4.75 cm      LV e' medial:   8.92 cm/s LV PW:         1.05 cm      LV E/e' medial: 6.9 LV IVS:        0.85 cm LVOT diam:     2.10 cm LV SV:         26 LV SV Index:   11 LVOT Area:  3.46 cm  LV Volumes (MOD) LV vol d, MOD A2C: 143.0 ml LV vol d, MOD A4C: 121.0 ml LV vol s, MOD A2C: 82.9 ml LV vol s, MOD A4C: 86.4 ml LV SV MOD A2C:     60.1 ml LV SV MOD A4C:     121.0 ml LV SV MOD BP:      48.4 ml RIGHT VENTRICLE RV Basal diam:  3.32 cm LEFT ATRIUM             Index       RIGHT ATRIUM           Index LA diam:        4.80 cm 2.05 cm/m  RA Area:     20.00 cm LA Vol (A2C):   49.3 ml 21.09 ml/m RA Volume:   57.00 ml  24.38 ml/m LA Vol (A4C):   50.3 ml 21.51 ml/m LA Biplane Vol: 51.1 ml 21.86 ml/m  AORTIC VALVE                   PULMONIC VALVE AV Area (Vmax):    2.10 cm    PV Vmax:        0.52 m/s AV Area (Vmean):   2.05 cm    PV Peak grad:   1.1 mmHg AV Area (VTI):     2.64 cm    RVOT Peak grad: 1 mmHg AV Vmax:           74.13 cm/s AV Vmean:          52.967 cm/s AV VTI:            0.100 m AV Peak Grad:      2.2  mmHg AV Mean Grad:      1.3 mmHg LVOT Vmax:         45.00 cm/s LVOT Vmean:        31.400 cm/s LVOT VTI:          0.076 m LVOT/AV VTI ratio: 0.76  AORTA Ao Root diam: 2.70 cm MITRAL VALVE               TRICUSPID VALVE MV Area (PHT): 5.97 cm    TR Peak grad:   11.6 mmHg MV Decel Time: 127 msec    TR Vmax:        170.00 cm/s MV E velocity: 61.70 cm/s MV A velocity: 80.60 cm/s  SHUNTS MV E/A ratio:  0.77        Systemic VTI:  0.08 m                            Systemic Diam: 2.10 cm [name] MD Electronically signed by [name] MD Signature Date/Time: 07/07/2020/2:44:37 PM    Final    ECHO TEE  Result Date: 07/08/2020    TRANSESOPHOGEAL ECHO REPORT   Patient Name:   NICOLES MONTESINO Date of Exam: 07/08/2020 Medical Rec #:  [id number]    Height:       71.0 in Accession #:    [id number]   Weight:       252.0 lb Date of Birth:  November 26, 1965     BSA:          2.326 m Patient Age:    55 years     BP:           98/63 mmHg Patient Gender: M  HR:           59 bpm. Exam Location:  [facility] Procedure: Transesophageal Echo, Cardiac Doppler, Color Doppler and Saline            Contrast Bubble Study Indications:     Atrial fibrillation 427.31 / I48.91  History:         Patient has prior history of Echocardiogram examinations, most                  recent 07/07/2020.  Sonographer:     Sherrie Sport RDCS (AE) Referring Phys:  [place] Diagnosing Phys: [name] MD PROCEDURE: After discussion of the risks and benefits of a TEE, an informed consent was obtained from the patient. TEE procedure time was 30 minutes. The transesophogeal probe was passed without difficulty through the esophogus of the patient. Imaged were obtained with the patient in a left lateral decubitus position. Local oropharyngeal anesthetic was provided with Cetacaine and Benzocaine spray. Sedation performed by performing physician. Image quality was excellent. The patient's vital signs; including heart rate, blood pressure, and  oxygen saturation; remained stable throughout the procedure. The patient developed no complications during the procedure. A successful direct current cardioversion was performed at 150 joules with 1 attempt. IMPRESSIONS  1. Left ventricular ejection fraction, by estimation, is 30 to 35%. The left ventricle has moderately decreased function. The left ventricle has no regional wall motion abnormalities. The left ventricular internal cavity size was mildly dilated. Left ventricular diastolic function could not be evaluated.  2. Right ventricular systolic function is normal. The right ventricular size is normal.  3. Left atrial size was moderately dilated. No left atrial/left atrial appendage thrombus was detected.  4. The mitral valve is normal in structure. Mild mitral valve regurgitation. No evidence of mitral stenosis.  5. The aortic valve is normal in structure. Aortic valve regurgitation is not visualized. No aortic stenosis is present.  6. There is mild (Grade II) atheroma plaque involving the transverse aorta and descending aorta.  7. The inferior vena cava is normal in size with greater than 50% respiratory variability, suggesting right atrial pressure of 3 mmHg.  8. Agitated saline contrast bubble study was negative, with no evidence of any interatrial shunt. Conclusion(s)/Recommendation(s): Normal biventricular function without evidence of hemodynamically significant valvular heart disease. FINDINGS  Left Ventricle: Left ventricular ejection fraction, by estimation, is 30 to 35%. The left ventricle has moderately decreased function. The left ventricle has no regional wall motion abnormalities. The left ventricular internal cavity size was mildly dilated. There is no left ventricular hypertrophy. Left ventricular diastolic function could not be evaluated. Right Ventricle: The right ventricular size is normal. No increase in right ventricular wall thickness. Right ventricular systolic function is normal. Left  Atrium: Left atrial size was moderately dilated. Spontaneous echo contrast was present. No left atrial/left atrial appendage thrombus was detected. Right Atrium: Right atrial size was normal in size. Pericardium: There is no evidence of pericardial effusion. Mitral Valve: The mitral valve is normal in structure. Mild mitral valve regurgitation. No evidence of mitral valve stenosis. Tricuspid Valve: The tricuspid valve is normal in structure. Tricuspid valve regurgitation is mild . No evidence of tricuspid stenosis. Aortic Valve: The aortic valve is normal in structure. Aortic valve regurgitation is not visualized. No aortic stenosis is present. Pulmonic Valve: The pulmonic valve was normal in structure. Pulmonic valve regurgitation is not visualized. No evidence of pulmonic stenosis. Aorta: The aortic root is normal in size and structure.  There is mild (Grade II) atheroma plaque involving the transverse aorta and descending aorta. Venous: The inferior vena cava is normal in size with greater than 50% respiratory variability, suggesting right atrial pressure of 3 mmHg. IAS/Shunts: No atrial level shunt detected by color flow Doppler. Agitated saline contrast was given intravenously to evaluate for intracardiac shunting. Agitated saline contrast bubble study was negative, with no evidence of any interatrial shunt. [name] MD Electronically signed by [name] MD Signature Date/Time: 07/08/2020/4:30:30 PM    Final         Scheduled Meds: . amiodarone  400 mg Oral BID  . apixaban  5 mg Oral BID  . bisoprolol  5 mg Oral BID  . Chlorhexidine Gluconate Cloth  6 each Topical Daily  . insulin aspart  0-5 Units Subcutaneous QHS  . insulin aspart  0-9 Units Subcutaneous TID WC  . ipratropium-albuterol  3 mL Nebulization TID  . nicotine  14 mg Transdermal Daily  . predniSONE  40 mg Oral Q breakfast   Continuous Infusions: . azithromycin Stopped (07/08/20 1441)  . cefTRIAXone (ROCEPHIN)  IV Stopped  (07/08/20 1303)     LOS: 2 days    Time spent: 30 min    Desma Maxim, MD [facility]   If 7PM-7AM, please contact night-coverage [email or web address] Password [facility] 07/09/2020, 12:42 PM

## 2020-07-09 NOTE — Progress Notes (Signed)
Progress Note  Patient Name: Tracy Gardner Date of Encounter: 07/09/2020  Primary Cardiologist: [name], MD  Subjective   Feels that breathing is improving.  Still wheezing though. Maintaining sinus rhythm.  Inpatient Medications    Scheduled Meds: . amiodarone  400 mg Oral BID  . apixaban  5 mg Oral BID  . bisoprolol  5 mg Oral BID  . Chlorhexidine Gluconate Cloth  6 each Topical Daily  . insulin aspart  0-5 Units Subcutaneous QHS  . insulin aspart  0-9 Units Subcutaneous TID WC  . ipratropium-albuterol  3 mL Nebulization TID  . nicotine  14 mg Transdermal Daily  . predniSONE  40 mg Oral Q breakfast   Continuous Infusions: . azithromycin Stopped (07/08/20 1441)  . cefTRIAXone (ROCEPHIN)  IV Stopped (07/08/20 1303)  . diltiazem (CARDIZEM) infusion 7.5 mg/hr (07/08/20 0021)   PRN Meds: acetaminophen, melatonin, ondansetron (ZOFRAN) IV   Vital Signs    Vitals:   07/08/20 2100 07/08/20 2218 07/09/20 0642 07/09/20 0915  BP: 103/70 110/65 102/60 115/83  Pulse: 84 (!) 42 63 72  Resp: (!) 31 20 18 18   Temp:  (!) 97.5 F (36.4 C) 97.8 F (36.6 C) 97.8 F (36.6 C)  TempSrc:  Oral Oral   SpO2: 95% 96% 97% 97%  Weight:  116.8 kg    Height:        Intake/Output Summary (Last 24 hours) at 07/09/2020 1103 Last data filed at 07/09/2020 1004 Gross per 24 hour  Intake 627.16 ml  Output 350 ml  Net 277.16 ml   Filed Weights   07/07/20 1006 07/07/20 1451 07/08/20 2218  Weight: 115.7 kg 114.3 kg 116.8 kg    Physical Exam   GEN: Obese, in no acute distress.  HEENT: Grossly normal.  Neck: Supple, obese, difficult to gauge JVP. No carotid bruits, or masses. Cardiac: RRR, no murmurs, rubs, or gallops. No clubbing, cyanosis, edema.  Radials 2+, DP/PT 2+ and equal bilaterally.  Respiratory:  Respirations regular and unlabored, scattered rhonchi w/ insp/exp wheezing throughout A/P.   GI: Obese, soft, nontender, nondistended, BS + x 4. MS: no deformity or  atrophy. Skin: warm and dry, no rash. Neuro:  Strength and sensation are intact. Psych: AAOx3.  Normal affect.  Labs    Chemistry Recent Labs  Lab 07/07/20 1010 07/07/20 1226 07/08/20 0536 07/09/20 0410  NA 136  --  134* 135  K 4.3  --  4.9 4.8  CL 101  --  103 103  CO2 26  --  23 23  GLUCOSE 110*  --  169* 130*  BUN 17  --  23* 35*  CREATININE 1.08 1.13 1.12 1.51*  CALCIUM 8.8*  --  8.4* 8.6*  PROT 7.2  --   --   --   ALBUMIN 3.5  --   --   --   AST 18  --   --   --   ALT 19  --   --   --   ALKPHOS 65  --   --   --   BILITOT 1.2  --   --   --   GFRNONAA >60 >60 >60 54*  ANIONGAP 9  --  8 9     Hematology Recent Labs  Lab 07/07/20 1010 07/08/20 0536 07/09/20 0410  WBC 11.2* 14.1* 18.8*  RBC 6.10* 5.50 5.52  HGB 17.0 15.4 15.5  HCT 53.4* 48.7 49.2  MCV 87.5 88.5 89.1  MCH 27.9 28.0 28.1  MCHC 31.8  31.6 31.5  RDW 14.4 14.4 14.8  PLT 203 174 203    Cardiac Enzymes  Recent Labs  Lab 07/07/20 1010 07/07/20 1226  TROPONINIHS 17 16      BNP Recent Labs  Lab 07/07/20 1010  BNP 240.2*    Lipids  Lab Results  Component Value Date   CHOL 174 07/07/2020   HDL 30 (L) 07/07/2020   LDLCALC 119 (H) 07/07/2020   TRIG 127 07/07/2020   CHOLHDL 5.8 07/07/2020    HbA1c  Lab Results  Component Value Date   HGBA1C 6.5 (H) 07/07/2020    Radiology    CT Angio Chest PE W and/or Wo Contrast  Result Date: 07/07/2020 CLINICAL DATA:  Shortness of breath.  Atrial fibrillation. EXAM: CT ANGIOGRAPHY CHEST WITH CONTRAST TECHNIQUE: Multidetector CT imaging of the chest was performed using the standard protocol during bolus administration of intravenous contrast. Multiplanar CT image reconstructions and MIPs were obtained to evaluate the vascular anatomy. CONTRAST:  9mL OMNIPAQUE IOHEXOL 350 MG/ML SOLN COMPARISON:  Chest radiography same day FINDINGS: Cardiovascular: Heart size upper limits of normal. Relatively mild coronary artery calcification seems to be present.  Minimal aortic atherosclerotic calcification. Pulmonary arterial opacification is excellent. No pulmonary emboli. Mediastinum/Nodes: No mass or lymphadenopathy. Lungs/Pleura: No pulmonary edema or pleural effusion. The right lung is clear. There is mild hazy and patchy infiltrate in the left upper lobe and lingula consistent with bronchopneumonia. No dense consolidation, collapse or effusion. 4 mm subpleural nodule in the left lower lobe axial image 71. Upper Abdomen: Negative Musculoskeletal: Ordinary degenerative changes affect the thoracic spine. Chronic calcified disc herniation at T12-L1. Review of the MIP images confirms the above findings. IMPRESSION: 1. No pulmonary emboli. 2. Mild hazy and patchy infiltrate in the left upper lobe and lingula consistent with bronchopneumonia. No dense consolidation, collapse or effusion. 3. 4 mm subpleural nodule in the left lower lobe. This does not require further follow-up. 4. Mild coronary artery calcification and very minimal aortic atherosclerotic calcification. Electronically Signed   By: Nelson Chimes M.D.   On: 07/07/2020 11:19   DG Chest Port 1 View  Result Date: 07/07/2020 CLINICAL DATA:  Shortness of breath.  Atrial fibrillation. EXAM: PORTABLE CHEST 1 VIEW COMPARISON:  01/10/2018. FINDINGS: Mild cardiac enlargement. Pulmonary vascular congestion identified. No pleural effusions. No airspace opacification. Visualized osseous structures are unremarkable. IMPRESSION: Cardiac enlargement and pulmonary vascular congestion. Electronically Signed   By: Kerby Moors M.D.   On: 07/07/2020 10:46    Telemetry    RSR, PVCs, trending mostly in 60s - Personally Reviewed  Cardiac Studies   2D Echocardiogram 4.28.2022   1. Left ventricular ejection fraction, by estimation, is 35 to 40%. The  left ventricle has moderately decreased function. The left ventricle  demonstrates global hypokinesis. The left ventricular internal cavity size  was mildly dilated. Left  ventricular  diastolic parameters are indeterminate.   2. Right ventricular systolic function is normal. The right ventricular  size is normal. There is normal pulmonary artery systolic pressure.   3. Left atrial size was mildly dilated.  _____________   Transesophageal Echocardiogram & DCCV 4.29.2022   1. Left ventricular ejection fraction, by estimation, is 30 to 35%. The  left ventricle has moderately decreased function. The left ventricle has  no regional wall motion abnormalities. The left ventricular internal  cavity size was mildly dilated. Left  ventricular diastolic function could not be evaluated.   2. Right ventricular systolic function is normal. The right ventricular  size is  normal.   3. Left atrial size was moderately dilated. No left atrial/left atrial  appendage thrombus was detected.   4. The mitral valve is normal in structure. Mild mitral valve  regurgitation. No evidence of mitral stenosis.   5. The aortic valve is normal in structure. Aortic valve regurgitation is  not visualized. No aortic stenosis is present.   6. There is mild (Grade II) atheroma plaque involving the transverse  aorta and descending aorta.   7. The inferior vena cava is normal in size with greater than 50%  respiratory variability, suggesting right atrial pressure of 3 mmHg.   8. Agitated saline contrast bubble study was negative, with no evidence  of any interatrial shunt.   Cardioverted 1 time(s).  Cardioverted at  150 J. Synchronized biphasic Converted to NSR _____________     Patient Profile     55 y.o. male with a history of HTN, HL, obesity, DMII, CKD 3a, tob abuse, asthma, obesity, and polycythemia, who was admitted 4/28 w/ resp failure, LUL PNA, and Afib w/ RVR.  Now s/p TEE  DCCV 4/29.  Assessment & Plan    1.  Acute respiratory failure/L sided bronchoPNA: Admitted 4/28 w/ ~ 1 wk h/o progressive cough, dyspnea, wheezing and found to have LUL PNA.  Breathing stable but still  very tight on exam.  Abx, nebs, steroids per IM.  2. AFib w/ RVR:  Unclear onset, but presented in rapid afib in setting of #1.  S/p TEE and DCCV yesterday. Amio started due to concern for recurrence in setting of ongoing lung infection.  Maintaining sinus rhythm this AM.  As prev noted, will plan total of 5 days of amio 400 BID followed by reduction to 200mg  BID.  With relative youth, we will want to avoid long term exposure to amio - hopefully can limit treatment to 1-3 mos.  Eliquis has been started in setting of CHA2DSVASc = 4.  Cont bisoprolol.  3.  Cardiomyopathy:  EF 35-40% by echo in setting of rapid afib - ? Tachy mediated. Encouraged by nl HsTrops this admission. He notes wt gain but appears euvolemic on exam.  Bisoprolol started this admission (chosen b/c of lung infection/COPD).  Avoiding home dose of enalapril b/c of worsening renal fxn this AM.  Can hopefully resume prior to discharge.  Will plan outpt ischemic eval and repeat limited echo to eval EF in sinus rhythm.   4.  Acute on chronic stage III kidney dzs/solitary kidney: Creat up this AM to 1.51.  He did received contrast in setting of CTA chest on 4/28.  Home dose of enalapril has been on hold.  Cont to avoid nephrotoxic agents.  F/u in AM.   5.  Essential HTN:  Stable on bisoprolol.  Home dose of enalapril on hold in setting of above.  6.  DMII:  A1c 7.0 in Feb.  Consider SGLT2i pending renal fxn improvement.  7.  HL:  Cont statin.  8.  Coronary Ca2+:  Noted on CT.  Plan outpt myoview following full recovery from PNA.  Cont statin.  9.  Tob Abuse:  Cessation advised.  Signed, Murray Hodgkins, NP  07/09/2020, 11:03 AM    For questions or updates, please contact   Please consult [email or web address] for contact info under Cardiology/STEMI.

## 2020-07-09 NOTE — Progress Notes (Signed)
At 0540 Pt's heartrate down to 39 with PVC's and PAC's, not sustaining. In to check on pt,pt resting on right side and heartrate back up into the 70's.

## 2020-07-10 DIAGNOSIS — I4891 Unspecified atrial fibrillation: Secondary | ICD-10-CM | POA: Diagnosis not present

## 2020-07-10 DIAGNOSIS — I429 Cardiomyopathy, unspecified: Secondary | ICD-10-CM | POA: Diagnosis not present

## 2020-07-10 LAB — GLUCOSE, CAPILLARY
Glucose-Capillary: 106 mg/dL — ABNORMAL HIGH (ref 70–99)
Glucose-Capillary: 134 mg/dL — ABNORMAL HIGH (ref 70–99)
Glucose-Capillary: 154 mg/dL — ABNORMAL HIGH (ref 70–99)
Glucose-Capillary: 185 mg/dL — ABNORMAL HIGH (ref 70–99)

## 2020-07-10 LAB — BASIC METABOLIC PANEL
Anion gap: 7 (ref 5–15)
BUN: 32 mg/dL — ABNORMAL HIGH (ref 6–20)
CO2: 25 mmol/L (ref 22–32)
Calcium: 8.6 mg/dL — ABNORMAL LOW (ref 8.9–10.3)
Chloride: 105 mmol/L (ref 98–111)
Creatinine, Ser: 1.15 mg/dL (ref 0.61–1.24)
GFR, Estimated: >60 mL/min (ref >60)
Glucose, Bld: 106 mg/dL — ABNORMAL HIGH (ref 70–99)
Potassium: 4.8 mmol/L (ref 3.5–5.1)
Sodium: 137 mmol/L (ref 135–145)

## 2020-07-10 LAB — CULTURE, RESPIRATORY W GRAM STAIN
Culture: NORMAL
Special Requests: NORMAL

## 2020-07-10 MED ORDER — MELATONIN 5 MG PO TABS
5.0000 mg | ORAL_TABLET | Freq: Every evening | ORAL | Status: DC | PRN
  Filled 2020-07-10: qty 1

## 2020-07-10 MED ORDER — AZITHROMYCIN 250 MG PO TABS: 500.0000 mg | ORAL_TABLET | Freq: Every day | ORAL | Status: DC

## 2020-07-10 MED ORDER — AMIODARONE HCL 200 MG PO TABS
200.0000 mg | ORAL_TABLET | Freq: Two times a day (BID) | ORAL | Status: DC
  Administered 2020-07-10 – 2020-07-11 (×2): 200 mg via ORAL
  Filled 2020-07-10 (×2): qty 1

## 2020-07-10 MED ORDER — DIGOXIN 0.25 MG/ML IJ SOLN
0.2500 mg | INTRAMUSCULAR | Status: DC | PRN
Start: 2020-07-10 — End: 2020-07-12
  Administered 2020-07-10: 0.25 mg via INTRAVENOUS
  Filled 2020-07-10: qty 2

## 2020-07-10 MED ORDER — FUROSEMIDE 10 MG/ML IJ SOLN
40.0000 mg | Freq: Two times a day (BID) | INTRAMUSCULAR | Status: DC
  Administered 2020-07-10 – 2020-07-12 (×5): 40 mg via INTRAVENOUS
  Filled 2020-07-10 (×5): qty 4

## 2020-07-10 MED ORDER — AMOXICILLIN-POT CLAVULANATE 875-125 MG PO TABS
1.0000 | ORAL_TABLET | Freq: Two times a day (BID) | ORAL | Status: DC
  Administered 2020-07-10 – 2020-07-12 (×5): 1 via ORAL
  Filled 2020-07-10 (×5): qty 1

## 2020-07-10 NOTE — Progress Notes (Signed)
Progress Note  Patient Name: Tracy Gardner Date of Encounter: 07/10/2020  Primary Cardiologist: [name], MD  Subjective   Now feeling orthopneic w/ upper chest pressure and dyspnea when trying to lie flat that resolves w/ sitting up.  He notes that he has had this at home before and it improved when he was placed on HCTZ, which has been on hold since admission.  Maintaining sinus rhythm.  Inpatient Medications    Scheduled Meds: . amiodarone  400 mg Oral BID  . apixaban  5 mg Oral BID  . bisoprolol  5 mg Oral BID  . Chlorhexidine Gluconate Cloth  6 each Topical Daily  . insulin aspart  0-5 Units Subcutaneous QHS  . insulin aspart  0-9 Units Subcutaneous TID WC  . ipratropium-albuterol  3 mL Nebulization TID  . nicotine  14 mg Transdermal Daily  . predniSONE  40 mg Oral Q breakfast   Continuous Infusions: . azithromycin 500 mg (07/09/20 1532)  . cefTRIAXone (ROCEPHIN)  IV 2 g (07/09/20 1454)   PRN Meds: acetaminophen, melatonin, ondansetron (ZOFRAN) IV   Vital Signs    Vitals:   07/09/20 1947 07/09/20 1958 07/10/20 0527 07/10/20 0745  BP: 116/78  117/65 104/70  Pulse: 74  62 (!) 58  Resp:    18  Temp: 98.1 F (36.7 C)  (!) 97.5 F (36.4 C) 98.2 F (36.8 C)  TempSrc:    Oral  SpO2: 95% 95% 95% 96%  Weight:      Height:        Intake/Output Summary (Last 24 hours) at 07/10/2020 0756 Last data filed at 07/10/2020 0529 Gross per 24 hour  Intake 960 ml  Output 2200 ml  Net -1240 ml   Filed Weights   07/07/20 1006 07/07/20 1451 07/08/20 2218  Weight: 115.7 kg 114.3 kg 116.8 kg    Physical Exam   GEN: Obese, in no acute distress.  HEENT: Grossly normal.  Neck: Supple, obese, difficult to gauge due to body habitus, but likely mod elev JVP.  No carotid bruits, or masses. Cardiac: RRR, distant, no murmurs, rubs, or gallops. No clubbing, cyanosis, trace bilat LE edema.  Radials 2+, DP/PT 2+ and equal bilaterally.  Respiratory:  Respirations regular and  unlabored, coarse breath sounds w/ scattered insp/exp wheezing throughout. GI: Obese, protuberant, semi-frim, nontender, BS + x 4. MS: no deformity or atrophy. Skin: warm and dry, no rash. Neuro:  Strength and sensation are intact. Psych: AAOx3.  Normal affect.  Labs    Chemistry Recent Labs  Lab 07/07/20 1010 07/07/20 1226 07/08/20 0536 07/09/20 0410 07/10/20 0411  NA 136  --  134* 135 137  K 4.3  --  4.9 4.8 4.8  CL 101  --  103 103 105  CO2 26  --  23 23 25   GLUCOSE 110*  --  169* 130* 106*  BUN 17  --  23* 35* 32*  CREATININE 1.08   < > 1.12 1.51* 1.15  CALCIUM 8.8*  --  8.4* 8.6* 8.6*  PROT 7.2  --   --   --   --   ALBUMIN 3.5  --   --   --   --   AST 18  --   --   --   --   ALT 19  --   --   --   --   ALKPHOS 65  --   --   --   --   BILITOT 1.2  --   --   --   --  GFRNONAA >60   < > >60 54* >60  ANIONGAP 9  --  8 9 7    < > = values in this interval not displayed.     Hematology Recent Labs  Lab 07/07/20 1010 07/08/20 0536 07/09/20 0410  WBC 11.2* 14.1* 18.8*  RBC 6.10* 5.50 5.52  HGB 17.0 15.4 15.5  HCT 53.4* 48.7 49.2  MCV 87.5 88.5 89.1  MCH 27.9 28.0 28.1  MCHC 31.8 31.6 31.5  RDW 14.4 14.4 14.8  PLT 203 174 203    Cardiac Enzymes  Recent Labs  Lab 07/07/20 1010 07/07/20 1226  TROPONINIHS 17 16      BNP Recent Labs  Lab 07/07/20 1010  BNP 240.2*     Lipids  Lab Results  Component Value Date   CHOL 174 07/07/2020   HDL 30 (L) 07/07/2020   LDLCALC 119 (H) 07/07/2020   TRIG 127 07/07/2020   CHOLHDL 5.8 07/07/2020    HbA1c  Lab Results  Component Value Date   HGBA1C 6.5 (H) 07/07/2020    Radiology    CT Angio Chest PE W and/or Wo Contrast  Result Date: 07/07/2020 CLINICAL DATA:  Shortness of breath.  Atrial fibrillation. EXAM: CT ANGIOGRAPHY CHEST WITH CONTRAST TECHNIQUE: Multidetector CT imaging of the chest was performed using the standard protocol during bolus administration of intravenous contrast. Multiplanar CT image  reconstructions and MIPs were obtained to evaluate the vascular anatomy. CONTRAST:  78mL OMNIPAQUE IOHEXOL 350 MG/ML SOLN COMPARISON:  Chest radiography same day FINDINGS: Cardiovascular: Heart size upper limits of normal. Relatively mild coronary artery calcification seems to be present. Minimal aortic atherosclerotic calcification. Pulmonary arterial opacification is excellent. No pulmonary emboli. Mediastinum/Nodes: No mass or lymphadenopathy. Lungs/Pleura: No pulmonary edema or pleural effusion. The right lung is clear. There is mild hazy and patchy infiltrate in the left upper lobe and lingula consistent with bronchopneumonia. No dense consolidation, collapse or effusion. 4 mm subpleural nodule in the left lower lobe axial image 71. Upper Abdomen: Negative Musculoskeletal: Ordinary degenerative changes affect the thoracic spine. Chronic calcified disc herniation at T12-L1. Review of the MIP images confirms the above findings. IMPRESSION: 1. No pulmonary emboli. 2. Mild hazy and patchy infiltrate in the left upper lobe and lingula consistent with bronchopneumonia. No dense consolidation, collapse or effusion. 3. 4 mm subpleural nodule in the left lower lobe. This does not require further follow-up. 4. Mild coronary artery calcification and very minimal aortic atherosclerotic calcification. Electronically Signed   By: Nelson Chimes M.D.   On: 07/07/2020 11:19   DG Chest Port 1 View  Result Date: 07/07/2020 CLINICAL DATA:  Shortness of breath.  Atrial fibrillation. EXAM: PORTABLE CHEST 1 VIEW COMPARISON:  01/10/2018. FINDINGS: Mild cardiac enlargement. Pulmonary vascular congestion identified. No pleural effusions. No airspace opacification. Visualized osseous structures are unremarkable. IMPRESSION: Cardiac enlargement and pulmonary vascular congestion. Electronically Signed   By: Kerby Moors M.D.   On: 07/07/2020 10:46   Telemetry    SB to RSR.  Drops into 30's @ night.  PACs/PVCs - Personally  Reviewed  Cardiac Studies   2D Echocardiogram 4.28.2022  1. Left ventricular ejection fraction, by estimation, is 35 to 40%. The  left ventricle has moderately decreased function. The left ventricle  demonstrates global hypokinesis. The left ventricular internal cavity size  was mildly dilated. Left ventricular  diastolic parameters are indeterminate.  2. Right ventricular systolic function is normal. The right ventricular  size is normal. There is normal pulmonary artery systolic pressure.  3. Left  atrial size was mildly dilated.  _____________   Transesophageal Echocardiogram & DCCV 4.29.2022  1. Left ventricular ejection fraction, by estimation, is 30 to 35%. The  left ventricle has moderately decreased function. The left ventricle has  no regional wall motion abnormalities. The left ventricular internal  cavity size was mildly dilated. Left  ventricular diastolic function could not be evaluated.  2. Right ventricular systolic function is normal. The right ventricular  size is normal.  3. Left atrial size was moderately dilated. No left atrial/left atrial  appendage thrombus was detected.  4. The mitral valve is normal in structure. Mild mitral valve  regurgitation. No evidence of mitral stenosis.  5. The aortic valve is normal in structure. Aortic valve regurgitation is  not visualized. No aortic stenosis is present.  6. There is mild (Grade II) atheroma plaque involving the transverse  aorta and descending aorta.  7. The inferior vena cava is normal in size with greater than 50%  respiratory variability, suggesting right atrial pressure of 3 mmHg.  8. Agitated saline contrast bubble study was negative, with no evidence  of any interatrial shunt.   Cardioverted 1 time(s).  Cardioverted at150J. Synchronized biphasic Converted to NSR _____________  Patient Profile     55 y.o.malewith a history of HTN, HL, obesity, DMII, CKD 3a, tob abuse, asthma,  obesity, and polycythemia, who was admitted 4/28 w/ resp failure, LUL PNA, and Afib w/ RVR.  Now s/p TEE  DCCV 4/29.  Assessment & Plan    1.  Acute respiratory failure/left-sided bronchopneumonia: Admitted April 28 with a 1 week history of progressive cough, dyspnea, wheezing, and found to have left upper lobe pneumonia.  Antibiotics, nebulizers, steroids per internal medicine.  He has developed orthopnea.  Adding diuretics today.  See below.  2.  Acute systolic congestive heart failure/cardiomyopathy: EF 35 to 40% by echocardiogram in the setting of rapid atrial fibrillation.  May be tachycardia mediated and he is now status post cardioversion.  Encouraged by normal high-sensitivity troponins this admission.  This morning, he reports orthopnea since yesterday.  Notes that he had similar @ home prev and it improved w/ HCTZ, which has been on hold. He is ~ net even for admission.  Last wt up 2.5 kg on 4/29.  Exam is challenging due to body habitus, but his abd is firmer than previously noted, he has trace lower ext edema, and at least moderate JVD is noted. His creat is improved today (solitary kidney).  Will diurese today - lasix 40 IV bid.  Cont  blocker.  Will hold off on resumption of acei given need for diuresis today and elev creat yesterday.  As previously noted, in the setting of LV dysfunction, will need outpatient ischemic evaluation and limited echo to reevaluate LV function in sinus rhythm.  3.  Atrial fibrillation with rapid ventricular response: Unclear onset but presented rapid atrial fibrillation in the setting of #1.  Status post TEE and cardioversion on April 29.  Maintaining sinus rhythm on amiodarone therapy.  This is his third day of 40 mg twice daily with a plan to reduce to 1 mg twice daily after 5 total days.  With relative youth, we will want to avoid long-term exposure to amnio-hopefully can limit to 1 to 3 months.  Continue Eliquis in the setting of a CHA2DS2-VASc of 4.  Continue  bisoprolol.  4.  Acute on chronic stage III kidney disease/solitary kidney: Creatinine up to 1.51 on April 30 but this is improved and is  down to 1.15 this morning.  As he will not require diuresis, will hold off on resuming ACE inhibitor.  5.  Essential hypertension: Stable on bisoprolol.  Was on enalapril-HCTZ at home.  6.  Type 2 diabetes mellitus: A1c 7.0 in February.  Sliding scale insulin per medicine.  Will consider SGLT2 inhibitor.  7.  Hyperlipidemia: Continue statin therapy.  8.  Coronary calcification: Noted on CT.  Plan outpatient Myoview following full recovery from pneumonia.  Continue statin.  9.  Tobacco abuse: Cessation advised.  Signed, Murray Hodgkins, NP  07/10/2020, 7:56 AM    For questions or updates, please contact   Please consult [email or web address] for contact info under Cardiology/STEMI.

## 2020-07-10 NOTE — Progress Notes (Addendum)
PROGRESS NOTE    Tracy Gardner  FSF:[id number] DOB: 1966/01/13 DOA: 07/07/2020 PCP: Wilford Corner, PA-C  Outpatient Specialists: heme/onc    Brief Narrative:   Tracy Gardner is a 55 y.o. male with medical history significant for asthma, tobacco use disorder, erythrocytosis, type 2 diabetes, essential hypertension, hyperlipidemia who presented to [facility] ED from his PCPs office after being advised to present to the ED due to new onset A. fib with RVR.  Patient reports for the last week he has had a productive cough with brownish, greenish sputum and shortness of breath with minimal exertion.  No chest pain.  No fever.  Thought they were allergies symptoms.  Ongoing tobacco use, half a pack a day.  His cough has been more productive than his usual chronic smoker's cough.  No lower extremity edema or tenderness.  He went to his primary care provider today for a regular checkup after recently started on metformin for type 2 diabetes.  While he was there vital signs were abnormal, his heart rate was in the 150s.  Twelve-lead EKG revealed A. fib with RVR.  Recommended to come to the ED for further evaluation.  A. fib was confirmed.  Hypoxic on exam requiring 2 to 3 L nasal cannula.  Due to concern for PE he had a CTA chest which was negative for PE but showed bronchial pneumonia.  Started on IV antibiotics empirically in the ED.  Also received a dose of IV Solu-Medrol 125 mg x 1 due to wheezing likely from asthma exacerbation.  EDP called for admission.  ED Course:  Afebrile.  BP 104/79, pulse 120, respiratory rate 32, O2 saturation 94% on 3 L.  Lab studies remarkable for BNP 240, troponin 17, normal.  WBC 11.2.  Twelve-lead EKG A. fib with rate of 148, nonspecific ST-T changes.  QTc 473.   Assessment & Plan:   Principal Problem:   New onset atrial fibrillation (HCC) Active Problems:   Dilated cardiomyopathy (HCC)   Bronchial pneumonia   Smoker   Diabetes mellitus type 2 with complications  (HCC)   Cardiomyopathy (HCC)  New onset A. fib with RVR HFrEF, dilated cardiomyopathy Heart rate in the 150s in the ED, twelve-lead EKG revealed A. fib with RVR, started on diltiazem drip. CHA2DS2-VASc score 2, no history of head trauma or GI bleed. EF 35-40 on TTE. TEE cardioversion performed 4/29, successful. At risk for recurrence. tsh wnl. No PE on cta. Asthma exacerbation/pna likely contributory, though possible this is longstanding. Euvolemic. Rate controlled currently. This morning complaining of orthopnea - cards following - amio started - cont bisoprolol - eliquis started, monitor kidney function - home enalapril/hctz on hold - diuresing with lasix 40 iv bid today - monitor I/os  Acute hypoxic respiratory failure secondary to bronchial pneumonia and acute asthma exacerbation. Not on oxygen supplementation at baseline. Wheezing improved and symptomatically much improved. Now weaned off o2. - Continue IV steroids, bronchodilators, incentive spirometer.   Bronchial pneumonia, POA Presented with 1 week of dyspnea and productive cough. Much improved - sputum culture ngtd - stop ceftriaxone (started 4/28). De-escalate to augmentin. Stop azithromycin has received 500 for 3 days  Type 2 diabetes with hyperglycemia Recently started on metformin. a1c 6.5 - ssi - consider sglt2i if kidney function hold  AKI Cr 1.5 nor resolved to baseline of 1.1. did receive contrast, not otherwise on nephrotoxins. Tolerating po - monitor  Tobacco use disorder Reports he has cut down to half a pack a day from 1  pack a day since the age of 66. Patient has received tobacco cessation counseling at bedside Nicotine patch as needed.  Essential hypertension BP is currently at goal - bisoprolol as above - holding enalapril/hctz in setting of aki (resolved) and now diuresis w/ lasix today  Hyperlipidemia Resume home regimen  Suspected OSA Made pt aware likely contributes to current condition,  needs outpatient sleep study   DVT prophylaxis: eliquis Code Status: full Family Communication: wife updated bedside 4/30, she is a Marine scientist at our hospital  Level of care: Progressive Cardiac Status is: Inpatient  Remains inpatient appropriate because:Inpatient level of care appropriate due to severity of illness   Dispo:  Patient From: Home  Planned Disposition: Home, possibly tomorrow  Medically stable for discharge: No       Consultants:  cardiology  Procedures: TEE cardioversion 4/29  Antimicrobials:  Azithromycin/ceftriaxone 4/28>    Subjective: This morning feeling well. No dyspnea, thinks breathing is at baseline. Has appetite. Orthopnea overnight. No chest pain.  Objective: Vitals:   07/09/20 1947 07/09/20 1958 07/10/20 0527 07/10/20 0745  BP: 116/78  117/65 104/70  Pulse: 74  62 (!) 58  Resp:    18  Temp: 98.1 F (36.7 C)  (!) 97.5 F (36.4 C) 98.2 F (36.8 C)  TempSrc:    Oral  SpO2: 95% 95% 95% 96%  Weight:      Height:        Intake/Output Summary (Last 24 hours) at 07/10/2020 1043 Last data filed at 07/10/2020 0529 Gross per 24 hour  Intake 720 ml  Output 2200 ml  Net -1480 ml   Filed Weights   07/07/20 1006 07/07/20 1451 07/08/20 2218  Weight: 115.7 kg 114.3 kg 116.8 kg    Examination:  General exam: Appears calm and comfortable  Respiratory system: exp wheeze throughout improved, better air movement. Respiratory effort normal. Cardiovascular system: S1 & S2 heard, RRR. Soft systolic murmur Gastrointestinal system: Abdomen is obese, soft and nontender. No organomegaly or masses felt. Normal bowel sounds heard. Central nervous system: Alert and oriented. No focal neurological deficits. Extremities: Symmetric 5 x 5 power. Trace LE edema Skin: No rashes, lesions or ulcers Psychiatry: Judgement and insight appear normal. Mood & affect appropriate.     Data Reviewed: I have personally reviewed following labs and imaging  studies  CBC: Recent Labs  Lab 07/07/20 1010 07/08/20 0536 07/09/20 0410  WBC 11.2* 14.1* 18.8*  HGB 17.0 15.4 15.5  HCT 53.4* 48.7 49.2  MCV 87.5 88.5 89.1  PLT 203 [id number]   Basic Metabolic Panel: Recent Labs  Lab 07/07/20 1010 07/07/20 1226 07/08/20 0536 07/09/20 0410 07/10/20 0411  NA 136  --  134* 135 137  K 4.3  --  4.9 4.8 4.8  CL 101  --  103 103 105  CO2 26  --  23 23 25   GLUCOSE 110*  --  169* 130* 106*  BUN 17  --  23* 35* 32*  CREATININE 1.08 1.13 1.12 1.51* 1.15  CALCIUM 8.8*  --  8.4* 8.6* 8.6*  MG 1.6*  --  1.7  --   --   PHOS  --   --  4.9*  --   --    GFR: Estimated Creatinine Clearance: 94.3 mL/min (by C-G formula based on SCr of 1.15 mg/dL). Liver Function Tests: Recent Labs  Lab 07/07/20 1010  AST 18  ALT 19  ALKPHOS 65  BILITOT 1.2  PROT 7.2  ALBUMIN 3.5   No results  for input(s): LIPASE, AMYLASE in the last 168 hours. No results for input(s): AMMONIA in the last 168 hours. Coagulation Profile: Recent Labs  Lab 07/07/20 1601  INR 1.0   Cardiac Enzymes: No results for input(s): CKTOTAL, CKMB, CKMBINDEX, TROPONINI in the last 168 hours. BNP (last 3 results) No results for input(s): PROBNP in the last 8760 hours. HbA1C: Recent Labs    07/07/20 1450  HGBA1C 6.5*   CBG: Recent Labs  Lab 07/09/20 0814 07/09/20 1137 07/09/20 1801 07/09/20 2127 07/10/20 0747  GLUCAP 104* 146* 164* 203* 106*   Lipid Profile: Recent Labs    07/07/20 1450  CHOL 174  HDL 30*  LDLCALC 119*  TRIG 127  CHOLHDL 5.8   Thyroid Function Tests: No results for input(s): TSH, T4TOTAL, FREET4, T3FREE, THYROIDAB in the last 72 hours. Anemia Panel: No results for input(s): VITAMINB12, FOLATE, FERRITIN, TIBC, IRON, RETICCTPCT in the last 72 hours. Urine analysis:    Component Value Date/Time   COLORURINE Yellow 01/24/2013 1607   APPEARANCEUR Clear 01/24/2013 1607   LABSPEC 1.033 01/24/2013 1607   PHURINE 5.0 01/24/2013 1607   GLUCOSEU Negative  01/24/2013 1607   HGBUR Negative 01/24/2013 1607   BILIRUBINUR Negative 01/24/2013 1607   KETONESUR Negative 01/24/2013 1607   PROTEINUR >=500 01/24/2013 1607   NITRITE Negative 01/24/2013 1607   LEUKOCYTESUR Negative 01/24/2013 1607   Sepsis Labs: @LABRCNTIP (procalcitonin:4,lacticidven:4)  ) Recent Results (from the past 240 hour(s))  Resp Panel by RT-PCR (Flu A&B, Covid) Nasopharyngeal Swab     Status: None   Collection Time: 07/07/20 10:11 AM   Specimen: Nasopharyngeal Swab; Nasopharyngeal(NP) swabs in vial transport medium  Result Value Ref Range Status   SARS Coronavirus 2 by RT PCR NEGATIVE NEGATIVE Final    Comment: (NOTE) SARS-CoV-2 target nucleic acids are NOT DETECTED.  The SARS-CoV-2 RNA is generally detectable in upper respiratory specimens during the acute phase of infection. The lowest concentration of SARS-CoV-2 viral copies this assay can detect is 138 copies/mL. A negative result does not preclude SARS-Cov-2 infection and should not be used as the sole basis for treatment or other patient management decisions. A negative result may occur with  improper specimen collection/handling, submission of specimen other than nasopharyngeal swab, presence of viral mutation(s) within the areas targeted by this assay, and inadequate number of viral copies(<138 copies/mL). A negative result must be combined with clinical observations, patient history, and epidemiological information. The expected result is Negative.  Fact Sheet for Patients:  EntrepreneurPulse.com.au  Fact Sheet for Healthcare Providers:  IncredibleEmployment.be  This test is no t yet approved or cleared by the [place] FDA and  has been authorized for detection and/or diagnosis of SARS-CoV-2 by FDA under an Emergency Use Authorization (EUA). This EUA will remain  in effect (meaning this test can be used) for the duration of the COVID-19 declaration under Section  564(b)(1) of the Act, 21 U.S.C.section 360bbb-3(b)(1), unless the authorization is terminated  or revoked sooner.       Influenza A by PCR NEGATIVE NEGATIVE Final   Influenza B by PCR NEGATIVE NEGATIVE Final    Comment: (NOTE) The Xpert Xpress SARS-CoV-2/FLU/RSV plus assay is intended as an aid in the diagnosis of influenza from Nasopharyngeal swab specimens and should not be used as a sole basis for treatment. Nasal washings and aspirates are unacceptable for Xpert Xpress SARS-CoV-2/FLU/RSV testing.  Fact Sheet for Patients: EntrepreneurPulse.com.au  Fact Sheet for Healthcare Providers: IncredibleEmployment.be  This test is not yet approved or cleared by the  [place] Architectural technologist and has been authorized for detection and/or diagnosis of SARS-CoV-2 by FDA under an Print production planner (EUA). This EUA will remain in effect (meaning this test can be used) for the duration of the COVID-19 declaration under Section 564(b)(1) of the Act, 21 U.S.C. section 360bbb-3(b)(1), unless the authorization is terminated or revoked.  Performed at [facility], [address] [place], [place] [address]   Blood culture (routine x 2)     Status: None (Preliminary result)   Collection Time: 07/07/20 12:25 PM   Specimen: BLOOD  Result Value Ref Range Status   Specimen Description BLOOD RIGHT ANTECUBITAL  Final   Special Requests   Final    BOTTLES DRAWN AEROBIC AND ANAEROBIC Blood Culture adequate volume   Culture   Final    NO GROWTH 3 DAYS Performed at [facility], [address], [place], [place] [address]    Report Status PENDING  Incomplete  Blood culture (routine x 2)     Status: None (Preliminary result)   Collection Time: 07/07/20 12:25 PM   Specimen: BLOOD  Result Value Ref Range Status   Specimen Description BLOOD BLOOD RIGHT HAND  Final   Special Requests   Final    BOTTLES DRAWN AEROBIC AND ANAEROBIC Blood Culture  adequate volume   Culture   Final    NO GROWTH 3 DAYS Performed at [facility], [address] [place], [place] [address]    Report Status PENDING  Incomplete  Expectorated Sputum Assessment w Gram Stain, Rflx to Resp Cult     Status: None   Collection Time: 07/07/20 12:28 PM   Specimen: Expectorated Sputum  Result Value Ref Range Status   Specimen Description EXPECTORATED SPUTUM  Final   Special Requests Normal  Final   Sputum evaluation   Final    THIS SPECIMEN IS ACCEPTABLE FOR SPUTUM CULTURE Performed at [facility], [address], [place], [place] [address]    Report Status 07/07/2020 FINAL  Final  Culture, Respiratory w Gram Stain     Status: None   Collection Time: 07/07/20 12:28 PM  Result Value Ref Range Status   Specimen Description   Final    EXPECTORATED SPUTUM Performed at [facility], [address] [place], [place] [address]    Special Requests   Final    Normal Reflexed from [id number] Performed at [facility], [address] [place], [place] [address]    Gram Stain   Final    ABUNDANT WBC PRESENT, PREDOMINANTLY PMN ABUNDANT GRAM NEGATIVE RODS FEW GRAM POSITIVE COCCI    Culture   Final    FEW Normal respiratory flora-no Staph aureus or Pseudomonas seen Performed at [facility], [place] [address], [place], [place] [address]    Report Status 07/10/2020 FINAL  Final  MRSA PCR Screening     Status: None   Collection Time: 07/07/20  2:54 PM   Specimen: Nasopharyngeal  Result Value Ref Range Status   MRSA by PCR NEGATIVE NEGATIVE Final    Comment:        The GeneXpert MRSA Assay (FDA approved for NASAL specimens only), is one component of a comprehensive MRSA colonization surveillance program. It is not intended to diagnose MRSA infection nor to guide or monitor treatment for MRSA infections. Performed at [facility], [address] [place],  [address]          Radiology  Studies: No results found.  Scheduled Meds: . amiodarone  400 mg Oral BID  . apixaban  5 mg Oral BID  . bisoprolol  5 mg Oral BID  . Chlorhexidine Gluconate Cloth  6 each Topical Daily  . furosemide  40 mg Intravenous BID  . insulin aspart  0-5 Units Subcutaneous QHS  . insulin aspart  0-9 Units Subcutaneous TID WC  . ipratropium-albuterol  3 mL Nebulization TID  . nicotine  14 mg Transdermal Daily  . predniSONE  40 mg Oral Q breakfast   Continuous Infusions: . azithromycin 500 mg (07/09/20 1532)  . cefTRIAXone (ROCEPHIN)  IV 2 g (07/09/20 1454)     LOS: 3 days    Time spent: 64 min    Desma Maxim, MD [facility]   If 7PM-7AM, please contact night-coverage [email or web address] Password [facility] 07/10/2020, 10:43 AM

## 2020-07-10 NOTE — Progress Notes (Signed)
Pt states that he takes Enalapril/ Hydrochlorothiazide 5-12.5 0.5 tablets daily and has not be ordered since admission.

## 2020-07-10 NOTE — Progress Notes (Signed)
PHARMACIST - PHYSICIAN COMMUNICATION  CONCERNING: Antibiotic IV to Oral Route Change Policy  RECOMMENDATION: This patient is receiving azithromycin by the intravenous route.  Based on criteria approved by the Pharmacy and Therapeutics Committee, the antibiotic(s) is/are being converted to the equivalent oral dose form(s).   DESCRIPTION: These criteria include:  Patient being treated for a respiratory tract infection, urinary tract infection, cellulitis or clostridium difficile associated diarrhea if on metronidazole  The patient is not neutropenic and does not exhibit a GI malabsorption state  The patient is eating (either orally or via tube) and/or has been taking other orally administered medications for a least 24 hours  The patient is improving clinically and has a Tmax < 100.5  If you have questions about this conversion, please contact the Pharmacy Department   Lynnita Somma B Cella Cappello  07/10/20   

## 2020-07-11 DIAGNOSIS — I5021 Acute systolic (congestive) heart failure: Secondary | ICD-10-CM

## 2020-07-11 LAB — BASIC METABOLIC PANEL
Anion gap: 9 (ref 5–15)
BUN: 33 mg/dL — ABNORMAL HIGH (ref 6–20)
CO2: 28 mmol/L (ref 22–32)
Calcium: 8.7 mg/dL — ABNORMAL LOW (ref 8.9–10.3)
Chloride: 102 mmol/L (ref 98–111)
Creatinine, Ser: 1.16 mg/dL (ref 0.61–1.24)
GFR, Estimated: >60 mL/min (ref >60)
Glucose, Bld: 88 mg/dL (ref 70–99)
Potassium: 4.2 mmol/L (ref 3.5–5.1)
Sodium: 139 mmol/L (ref 135–145)

## 2020-07-11 LAB — GLUCOSE, CAPILLARY
Glucose-Capillary: 113 mg/dL — ABNORMAL HIGH (ref 70–99)
Glucose-Capillary: 138 mg/dL — ABNORMAL HIGH (ref 70–99)
Glucose-Capillary: 249 mg/dL — ABNORMAL HIGH (ref 70–99)
Glucose-Capillary: 234 mg/dL — ABNORMAL HIGH (ref 70–99)

## 2020-07-11 MED ORDER — AMIODARONE IV BOLUS ONLY 150 MG/100ML
150.0000 mg | Freq: Once | INTRAVENOUS | Status: DC
  Filled 2020-07-11: qty 100

## 2020-07-11 MED ORDER — SODIUM CHLORIDE 0.9% FLUSH
10.0000 mL | Freq: Two times a day (BID) | INTRAVENOUS | Status: DC
  Administered 2020-07-11 – 2020-07-12 (×2): 10 mL via INTRAVENOUS

## 2020-07-11 MED ORDER — AMIODARONE HCL 200 MG PO TABS
400.0000 mg | ORAL_TABLET | Freq: Two times a day (BID) | ORAL | Status: DC
  Administered 2020-07-11 – 2020-07-12 (×2): 400 mg via ORAL
  Filled 2020-07-11 (×2): qty 2

## 2020-07-11 NOTE — Progress Notes (Signed)
Progress Note  Patient Name: Tracy Gardner Date of Encounter: 07/11/2020  [facility] Cardiologist: [name], MD   Subjective   Went back to Afib overnight, rates relatively controlled.   UOP -5.1L. creatine stable. Some LLE on exam. Patient feels breathing is better.   Inpatient Medications    Scheduled Meds: . amiodarone  200 mg Oral BID  . amoxicillin-clavulanate  1 tablet Oral Q12H  . apixaban  5 mg Oral BID  . bisoprolol  5 mg Oral BID  . Chlorhexidine Gluconate Cloth  6 each Topical Daily  . furosemide  40 mg Intravenous BID  . insulin aspart  0-5 Units Subcutaneous QHS  . insulin aspart  0-9 Units Subcutaneous TID WC  . ipratropium-albuterol  3 mL Nebulization TID  . nicotine  14 mg Transdermal Daily  . predniSONE  40 mg Oral Q breakfast   Continuous Infusions:  PRN Meds: acetaminophen, digoxin, melatonin, ondansetron (ZOFRAN) IV   Vital Signs    Vitals:   07/10/20 1953 07/10/20 2154 07/11/20 0348 07/11/20 0744  BP: 122/82 118/81 111/71 102/75  Pulse: (!) 106 67 80 (!) 59  Resp: 18 19 19 20   Temp:  97.9 F (36.6 C) 98.2 F (36.8 C) 97.7 F (36.5 C)  TempSrc:    Oral  SpO2: 94% 94% 93% 93%  Weight:      Height:        Intake/Output Summary (Last 24 hours) at 07/11/2020 0912 Last data filed at 07/11/2020 2458 Gross per 24 hour  Intake 480 ml  Output 5125 ml  Net -4645 ml   Last 3 Weights 07/08/2020 07/07/2020 07/07/2020  Weight (lbs) 257 lb 6.4 oz 251 lb 15.8 oz 255 lb  Weight (kg) 116.756 kg 114.3 kg 115.667 kg      Telemetry    In and out of afib, currently in afib, HR 80-90s- Personally Reviewed  ECG    No new - Personally Reviewed  Physical Exam   GEN: No acute distress.   Neck: No JVD Cardiac: Irreg IRreg, no murmurs, rubs, or gallops.  Respiratory: wheezing GI: Soft, nontender, non-distended  MS: 1+Lower leg edema; No deformity. Neuro:  Nonfocal  Psych: Normal affect   Labs    High Sensitivity Troponin:   Recent Labs   Lab 07/07/20 1010 07/07/20 1226  TROPONINIHS 17 16      Chemistry Recent Labs  Lab 07/07/20 1010 07/07/20 1226 07/09/20 0410 07/10/20 0411 07/11/20 0547  NA 136   < > 135 137 139  K 4.3   < > 4.8 4.8 4.2  CL 101   < > 103 105 102  CO2 26   < > 23 25 28   GLUCOSE 110*   < > 130* 106* 88  BUN 17   < > 35* 32* 33*  CREATININE 1.08   < > 1.51* 1.15 1.16  CALCIUM 8.8*   < > 8.6* 8.6* 8.7*  PROT 7.2  --   --   --   --   ALBUMIN 3.5  --   --   --   --   AST 18  --   --   --   --   ALT 19  --   --   --   --   ALKPHOS 65  --   --   --   --   BILITOT 1.2  --   --   --   --   GFRNONAA >60   < > 54* >60 >  60  ANIONGAP 9   < > 9 7 9    < > = values in this interval not displayed.     Hematology Recent Labs  Lab 07/07/20 1010 07/08/20 0536 07/09/20 0410  WBC 11.2* 14.1* 18.8*  RBC 6.10* 5.50 5.52  HGB 17.0 15.4 15.5  HCT 53.4* 48.7 49.2  MCV 87.5 88.5 89.1  MCH 27.9 28.0 28.1  MCHC 31.8 31.6 31.5  RDW 14.4 14.4 14.8  PLT 203 174 203    BNP Recent Labs  Lab 07/07/20 1010  BNP 240.2*     DDimer No results for input(s): DDIMER in the last 168 hours.   Radiology    No results found.  Cardiac Studies   Echo 07/07/20 1. Left ventricular ejection fraction, by estimation, is 35 to 40%. The  left ventricle has moderately decreased function. The left ventricle  demonstrates global hypokinesis. The left ventricular internal cavity size  was mildly dilated. Left ventricular  diastolic parameters are indeterminate.  2. Right ventricular systolic function is normal. The right ventricular  size is normal. There is normal pulmonary artery systolic pressure.  3. Left atrial size was mildly dilated.   Patient Profile     55 y.o. male with pmh of HTN, HLD, DM2, CKD stage 2a, tob abuse, asthma, obesity and polycythemia who was admitted 4/28 with respiratory failure, LUL pNA, afib RVR s/p TEE/DCCV.  Assessment & Plan    Respiratory failure PNA - abx per IM -  breathing improving, wheezing on exam  Acute systolic heart failure CM EF 35-40% - unclear etiology on CM, possibly tachy-mediated - IV lasix 40mg  BID - plan to repeat echo in a couple months after remains in SR - home bisoprolol restarted>>canot titrate with intermittent soft pressures - if pressures improve consider ARB/Enstresto  New onset Afib RVR - in the setting of respiratory failure 2/2 PNA and CHF - s/p TEE/DCCV 4/29 - In and out of afib, currently in Afib with controlled rates - amiodarone 400mg  BID x 1 week (day 4), 200mg  BID x 1 week, 200mg  daily. He was prematurly switched ot 200mg  BID. Will goback to 400mg  BID - CHADSVASC of 4 - continue Eliquis  AKI/CKD stage 3 - creatinine improved - home ACE held>>can restart Losartan/Entresto for low EF   HTN - bisoprolol - PTA enalizpril-HCTA>>held as above - IV lasix - pressures intermittently soft  DM2 - A1C 6.5 - per IM  Tobacco use - cessation recommended  For questions or updates, please contact [facility] Please consult [email or web address] for contact info under        Signed, Callan Yontz Ninfa Meeker, PA-C  07/11/2020, 9:12 AM

## 2020-07-11 NOTE — Consult Note (Signed)
   [facility] Inpatient Consult   07/11/2020  Denilson Salminen [place]anning 03-25-1965 [id number]    [place] Organization [ACO] Patient:  [employer] employee  Patient is currently assigned with [facility] for chronic disease management services.  Our community based plan of care has focused on disease management and community resource support for members.    Patient will receive a post hospital follow up and will be evaluated for assessments and disease process education.       Plan: Patient will be followed by [place] Coordinator.   For additional questions or referrals please contact:   Natividad Brood, RN BSN [facility] Liaison  [phone] business mobile phone Toll free office [phone]  Fax number: [phone] [email or web address]

## 2020-07-11 NOTE — Progress Notes (Addendum)
PROGRESS NOTE    Tracy Gardner  JSE:[id number] DOB: Aug 13, 1965 DOA: 07/07/2020 PCP: [name], PA-C  Outpatient Specialists: heme/onc    Brief Narrative:   Tracy Gardner is a 56 y.o. male with medical history significant for asthma, tobacco use disorder, erythrocytosis, type 2 diabetes, essential hypertension, hyperlipidemia who presented to [facility] ED from his PCPs office after being advised to present to the ED due to new onset A. fib with RVR.  Patient reports for the last week he has had a productive cough with brownish, greenish sputum and shortness of breath with minimal exertion.  No chest pain.  No fever.  Thought they were allergies symptoms.  Ongoing tobacco use, half a pack a day.  His cough has been more productive than his usual chronic smoker's cough.  No lower extremity edema or tenderness.  He went to his primary care provider today for a regular checkup after recently started on metformin for type 2 diabetes.  While he was there vital signs were abnormal, his heart rate was in the 150s.  Twelve-lead EKG revealed A. fib with RVR.  Recommended to come to the ED for further evaluation.  A. fib was confirmed.  Hypoxic on exam requiring 2 to 3 L nasal cannula.  Due to concern for PE he had a CTA chest which was negative for PE but showed bronchial pneumonia.  Started on IV antibiotics empirically in the ED.  Also received a dose of IV Solu-Medrol 125 mg x 1 due to wheezing likely from asthma exacerbation.  EDP called for admission.  ED Course:  Afebrile.  BP 104/79, pulse 120, respiratory rate 32, O2 saturation 94% on 3 L.  Lab studies remarkable for BNP 240, troponin 17, normal.  WBC 11.2.  Twelve-lead EKG A. fib with rate of 148, nonspecific ST-T changes.  QTc 473.   Assessment & Plan:   Principal Problem:   New onset atrial fibrillation (HCC) Active Problems:   Dilated cardiomyopathy ([place])   Bronchial pneumonia   Smoker   Diabetes mellitus type 2 with complications  ([place])   Cardiomyopathy ([place])  New onset A. fib with RVR HFrEF, dilated cardiomyopathy Heart rate in the 150s in the ED, twelve-lead EKG revealed A. fib with RVR, started on diltiazem drip. CHA2DS2-VASc score 2, relatively low bleeding risk. EF 35-40 on TTE. TEE cardioversion performed 4/29, successful but with dilated cardiomyopathy at risk for recurrence and overnight 5/1 flipped back into a fib with rvr at times though asymptomatic. TSH wnl. No PE on cta. Asthma exacerbation/pna likely contributory. Orthopnea yesterday resolved with lasix - cards following - amio started, dose increased today - cont bisoprolol - eliquis started, monitor kidney function - home enalapril/hctz on hold - diuresing with lasix 40 iv bid  - monitor I/os  Acute hypoxic respiratory failure secondary to bronchial pneumonia and acute asthma exacerbation. Not on oxygen supplementation at baseline. Wheezing improved and symptomatically much improved. Now weaned off o2. Is now s/p 5 days steroids. - Continue, bronchodilators, incentive spirometer.   Bronchial pneumonia, POA Presented with 1 week of dyspnea and productive cough. Much improved - sputum culture ngtd - stop ceftriaxone (started 4/28). De-escalated to augmentin. Stop azithromycin has received 500 for 3 days. Plan for 2 more days augmentin  Type 2 diabetes with hyperglycemia Recently started on metformin. a1c 6.5 - ssi - consider sglt2i if kidney function holds  AKI Cr 1.5 nor resolved to baseline of 1.1. did receive contrast, not otherwise on nephrotoxins. Tolerating po -  monitor  Tobacco use disorder Reports he has cut down to half a pack a day from 1 pack a day since the age of 55. Patient has received tobacco cessation counseling at bedside Nicotine patch as needed.  Essential hypertension BP is currently at goal - bisoprolol as above - holding enalapril/hctz in setting of aki (resolved) and now diuresis w/ lasix today - cardiology  considering entresto initiation  Hyperlipidemia Resume home regimen  Suspected OSA Made pt aware likely contributes to current condition, needs outpatient sleep study   DVT prophylaxis: eliquis Code Status: full Family Communication: wife updated bedside 5/1, she is a Marine scientist at our hospital  Level of care: Progressive Cardiac Status is: Inpatient  Remains inpatient appropriate because:Inpatient level of care appropriate due to severity of illness   Dispo:  Patient From: Home  Planned Disposition: Home, possibly tomorrow  Medically stable for discharge: No       Consultants:  cardiology  Procedures: TEE cardioversion 4/29  Antimicrobials:  Azithromycin/ceftriaxone 4/28>5/1, augmentin 5/1>   Subjective: This morning feeling well. No dyspnea, thinks breathing is at baseline. Has appetite. Orthopnea resolved. No chest pain.  Objective: Vitals:   07/10/20 2154 07/11/20 0348 07/11/20 0744 07/11/20 1136  BP: 118/81 111/71 102/75 102/85  Pulse: 67 80 (!) 59 95  Resp: 19 19 20 20   Temp: 97.9 F (36.6 C) 98.2 F (36.8 C) 97.7 F (36.5 C) 98 F (36.7 C)  TempSrc:   Oral Oral  SpO2: 94% 93% 93% 92%  Weight:      Height:        Intake/Output Summary (Last 24 hours) at 07/11/2020 1239 Last data filed at 07/11/2020 1138 Gross per 24 hour  Intake 480 ml  Output 6000 ml  Net -5520 ml   Filed Weights   07/07/20 1006 07/07/20 1451 07/08/20 2218  Weight: 115.7 kg 114.3 kg 116.8 kg    Examination:  General exam: Appears calm and comfortable  Respiratory system: exp wheeze throughout improved, better air movement. Respiratory effort normal. Cardiovascular system: S1 & S2 heard, RRR. Soft systolic murmur Gastrointestinal system: Abdomen is obese, soft and nontender. No organomegaly or masses felt. Normal bowel sounds heard. Central nervous system: Alert and oriented. No focal neurological deficits. Extremities: Symmetric 5 x 5 power. Trace LE edema Skin: No rashes,  lesions or ulcers Psychiatry: Judgement and insight appear normal. Mood & affect appropriate.     Data Reviewed: I have personally reviewed following labs and imaging studies  CBC: Recent Labs  Lab 07/07/20 1010 07/08/20 0536 07/09/20 0410  WBC 11.2* 14.1* 18.8*  HGB 17.0 15.4 15.5  HCT 53.4* 48.7 49.2  MCV 87.5 88.5 89.1  PLT 203 174 [id number]   Basic Metabolic Panel: Recent Labs  Lab 07/07/20 1010 07/07/20 1226 07/08/20 0536 07/09/20 0410 07/10/20 0411 07/11/20 0547  NA 136  --  134* 135 137 139  K 4.3  --  4.9 4.8 4.8 4.2  CL 101  --  103 103 105 102  CO2 26  --  23 23 25 28   GLUCOSE 110*  --  169* 130* 106* 88  BUN 17  --  23* 35* 32* 33*  CREATININE 1.08 1.13 1.12 1.51* 1.15 1.16  CALCIUM 8.8*  --  8.4* 8.6* 8.6* 8.7*  MG 1.6*  --  1.7  --   --   --   PHOS  --   --  4.9*  --   --   --    GFR: Estimated Creatinine Clearance:  93.5 mL/min (by C-G formula based on SCr of 1.16 mg/dL). Liver Function Tests: Recent Labs  Lab 07/07/20 1010  AST 18  ALT 19  ALKPHOS 65  BILITOT 1.2  PROT 7.2  ALBUMIN 3.5   No results for input(s): LIPASE, AMYLASE in the last 168 hours. No results for input(s): AMMONIA in the last 168 hours. Coagulation Profile: Recent Labs  Lab 07/07/20 1601  INR 1.0   Cardiac Enzymes: No results for input(s): CKTOTAL, CKMB, CKMBINDEX, TROPONINI in the last 168 hours. BNP (last 3 results) No results for input(s): PROBNP in the last 8760 hours. HbA1C: No results for input(s): HGBA1C in the last 72 hours. CBG: Recent Labs  Lab 07/10/20 1140 07/10/20 1616 07/10/20 2046 07/11/20 0745 07/11/20 1136  GLUCAP 134* 154* 185* 113* 138*   Lipid Profile: No results for input(s): CHOL, HDL, LDLCALC, TRIG, CHOLHDL, LDLDIRECT in the last 72 hours. Thyroid Function Tests: No results for input(s): TSH, T4TOTAL, FREET4, T3FREE, THYROIDAB in the last 72 hours. Anemia Panel: No results for input(s): VITAMINB12, FOLATE, FERRITIN, TIBC, IRON,  RETICCTPCT in the last 72 hours. Urine analysis:    Component Value Date/Time   COLORURINE Yellow 01/24/2013 1607   APPEARANCEUR Clear 01/24/2013 1607   LABSPEC 1.033 01/24/2013 1607   PHURINE 5.0 01/24/2013 1607   GLUCOSEU Negative 01/24/2013 1607   HGBUR Negative 01/24/2013 1607   BILIRUBINUR Negative 01/24/2013 1607   KETONESUR Negative 01/24/2013 1607   PROTEINUR >=500 01/24/2013 1607   NITRITE Negative 01/24/2013 1607   LEUKOCYTESUR Negative 01/24/2013 1607   Sepsis Labs: @LABRCNTIP (procalcitonin:4,lacticidven:4)  ) Recent Results (from the past 240 hour(s))  Resp Panel by RT-PCR (Flu A&B, Covid) Nasopharyngeal Swab     Status: None   Collection Time: 07/07/20 10:11 AM   Specimen: Nasopharyngeal Swab; Nasopharyngeal(NP) swabs in vial transport medium  Result Value Ref Range Status   SARS Coronavirus 2 by RT PCR NEGATIVE NEGATIVE Final    Comment: (NOTE) SARS-CoV-2 target nucleic acids are NOT DETECTED.  The SARS-CoV-2 RNA is generally detectable in upper respiratory specimens during the acute phase of infection. The lowest concentration of SARS-CoV-2 viral copies this assay can detect is 138 copies/mL. A negative result does not preclude SARS-Cov-2 infection and should not be used as the sole basis for treatment or other patient management decisions. A negative result may occur with  improper specimen collection/handling, submission of specimen other than nasopharyngeal swab, presence of viral mutation(s) within the areas targeted by this assay, and inadequate number of viral copies(<138 copies/mL). A negative result must be combined with clinical observations, patient history, and epidemiological information. The expected result is Negative.  Fact Sheet for Patients:  EntrepreneurPulse.com.au  Fact Sheet for Healthcare Providers:  IncredibleEmployment.be  This test is no t yet approved or cleared by the [place] FDA and   has been authorized for detection and/or diagnosis of SARS-CoV-2 by FDA under an Emergency Use Authorization (EUA). This EUA will remain  in effect (meaning this test can be used) for the duration of the COVID-19 declaration under Section 564(b)(1) of the Act, 21 U.S.C.section 360bbb-3(b)(1), unless the authorization is terminated  or revoked sooner.       Influenza A by PCR NEGATIVE NEGATIVE Final   Influenza B by PCR NEGATIVE NEGATIVE Final    Comment: (NOTE) The Xpert Xpress SARS-CoV-2/FLU/RSV plus assay is intended as an aid in the diagnosis of influenza from Nasopharyngeal swab specimens and should not be used as a sole basis for treatment. Nasal washings and aspirates  are unacceptable for Xpert Xpress SARS-CoV-2/FLU/RSV testing.  Fact Sheet for Patients: EntrepreneurPulse.com.au  Fact Sheet for Healthcare Providers: IncredibleEmployment.be  This test is not yet approved or cleared by the [place] FDA and has been authorized for detection and/or diagnosis of SARS-CoV-2 by FDA under an Emergency Use Authorization (EUA). This EUA will remain in effect (meaning this test can be used) for the duration of the COVID-19 declaration under Section 564(b)(1) of the Act, 21 U.S.C. section 360bbb-3(b)(1), unless the authorization is terminated or revoked.  Performed at [facility], [address] [place], [place] [address]   Blood culture (routine x 2)     Status: None (Preliminary result)   Collection Time: 07/07/20 12:25 PM   Specimen: BLOOD  Result Value Ref Range Status   Specimen Description BLOOD RIGHT ANTECUBITAL  Final   Special Requests   Final    BOTTLES DRAWN AEROBIC AND ANAEROBIC Blood Culture adequate volume   Culture   Final    NO GROWTH 4 DAYS Performed at [facility], [address] [place], [place] [address]    Report Status PENDING  Incomplete  Blood culture (routine x 2)     Status: None  (Preliminary result)   Collection Time: 07/07/20 12:25 PM   Specimen: BLOOD  Result Value Ref Range Status   Specimen Description BLOOD BLOOD RIGHT HAND  Final   Special Requests   Final    BOTTLES DRAWN AEROBIC AND ANAEROBIC Blood Culture adequate volume   Culture   Final    NO GROWTH 4 DAYS Performed at [facility], [address], [place], [place] [address]    Report Status PENDING  Incomplete  Expectorated Sputum Assessment w Gram Stain, Rflx to Resp Cult     Status: None   Collection Time: 07/07/20 12:28 PM   Specimen: Expectorated Sputum  Result Value Ref Range Status   Specimen Description EXPECTORATED SPUTUM  Final   Special Requests Normal  Final   Sputum evaluation   Final    THIS SPECIMEN IS ACCEPTABLE FOR SPUTUM CULTURE Performed at [facility], [address], [place], [place] [address]    Report Status 07/07/2020 FINAL  Final  Culture, Respiratory w Gram Stain     Status: None   Collection Time: 07/07/20 12:28 PM  Result Value Ref Range Status   Specimen Description   Final    EXPECTORATED SPUTUM Performed at [facility], [address] [place], [place] [address]    Special Requests   Final    Normal Reflexed from [id number] Performed at [facility], [address] [place], [place] [address]    Gram Stain   Final    ABUNDANT WBC PRESENT, PREDOMINANTLY PMN ABUNDANT GRAM NEGATIVE RODS FEW GRAM POSITIVE COCCI    Culture   Final    FEW Normal respiratory flora-no Staph aureus or Pseudomonas seen Performed at [facility], [place] [address] [place], [place] [address]    Report Status 07/10/2020 FINAL  Final  MRSA PCR Screening     Status: None   Collection Time: 07/07/20  2:54 PM   Specimen: Nasopharyngeal  Result Value Ref Range Status   MRSA by PCR NEGATIVE NEGATIVE Final    Comment:        The GeneXpert MRSA Assay (FDA approved for NASAL specimens only), is one component of a comprehensive MRSA  colonization surveillance program. It is not intended to diagnose MRSA infection nor to guide or monitor treatment for  MRSA infections. Performed at [facility], [address] [place], [place] [address]          Radiology Studies: No results found.      Scheduled Meds: . amiodarone  400 mg Oral BID  . amoxicillin-clavulanate  1 tablet Oral Q12H  . apixaban  5 mg Oral BID  . bisoprolol  5 mg Oral BID  . Chlorhexidine Gluconate Cloth  6 each Topical Daily  . furosemide  40 mg Intravenous BID  . insulin aspart  0-5 Units Subcutaneous QHS  . insulin aspart  0-9 Units Subcutaneous TID WC  . ipratropium-albuterol  3 mL Nebulization TID  . nicotine  14 mg Transdermal Daily  . predniSONE  40 mg Oral Q breakfast   Continuous Infusions:    LOS: 4 days    Time spent: 30 min    Desma Maxim, MD [facility]   If 7PM-7AM, please contact night-coverage [email or web address] Password [facility] 07/11/2020, 12:39 PM

## 2020-07-12 DIAGNOSIS — I480 Paroxysmal atrial fibrillation: Secondary | ICD-10-CM

## 2020-07-12 LAB — BASIC METABOLIC PANEL
Anion gap: 10 (ref 5–15)
BUN: 35 mg/dL — ABNORMAL HIGH (ref 6–20)
CO2: 28 mmol/L (ref 22–32)
Calcium: 8.9 mg/dL (ref 8.9–10.3)
Chloride: 100 mmol/L (ref 98–111)
Creatinine, Ser: 1.42 mg/dL — ABNORMAL HIGH (ref 0.61–1.24)
GFR, Estimated: 58 mL/min — ABNORMAL LOW (ref >60)
Glucose, Bld: 99 mg/dL (ref 70–99)
Potassium: 4.1 mmol/L (ref 3.5–5.1)
Sodium: 138 mmol/L (ref 135–145)

## 2020-07-12 LAB — CULTURE, BLOOD (ROUTINE X 2)
Culture: NO GROWTH
Culture: NO GROWTH
Special Requests: ADEQUATE
Special Requests: ADEQUATE

## 2020-07-12 LAB — GLUCOSE, CAPILLARY
Glucose-Capillary: 120 mg/dL — ABNORMAL HIGH (ref 70–99)
Glucose-Capillary: 140 mg/dL — ABNORMAL HIGH (ref 70–99)

## 2020-07-12 MED ORDER — NICOTINE 14 MG/24HR TD PT24: 14.0000 mg | MEDICATED_PATCH | Freq: Every day | TRANSDERMAL | 0 refills | Status: DC

## 2020-07-12 MED ORDER — FUROSEMIDE 40 MG PO TABS: 40.0000 mg | ORAL_TABLET | Freq: Every day | ORAL | 1 refills | Status: DC

## 2020-07-12 MED ORDER — ROSUVASTATIN CALCIUM 10 MG PO TABS: 10.0000 mg | ORAL_TABLET | Freq: Every day | ORAL | 1 refills | Status: DC

## 2020-07-12 MED ORDER — AMIODARONE HCL 200 MG PO TABS: ORAL_TABLET | ORAL | 1 refills | Status: DC

## 2020-07-12 MED ORDER — AMOXICILLIN-POT CLAVULANATE 875-125 MG PO TABS
1.0000 | ORAL_TABLET | Freq: Two times a day (BID) | ORAL | 0 refills | Status: DC
Start: 2020-07-12 — End: 2020-07-22

## 2020-07-12 MED ORDER — BISOPROLOL FUMARATE 5 MG PO TABS: 5.0000 mg | ORAL_TABLET | Freq: Two times a day (BID) | ORAL | 1 refills | Status: DC

## 2020-07-12 MED ORDER — FUROSEMIDE 40 MG PO TABS: 40.0000 mg | ORAL_TABLET | Freq: Every day | ORAL | Status: DC

## 2020-07-12 MED ORDER — FLUTICASONE PROPIONATE HFA 44 MCG/ACT IN AERO: 2.0000 | INHALATION_SPRAY | Freq: Two times a day (BID) | RESPIRATORY_TRACT | 2 refills | Status: DC

## 2020-07-12 MED ORDER — APIXABAN 5 MG PO TABS: 5.0000 mg | ORAL_TABLET | Freq: Two times a day (BID) | ORAL | 1 refills | Status: DC

## 2020-07-12 NOTE — Discharge Summary (Signed)
Tracy Gardner [id number] DOB: Nov 27, 1965 DOA: 07/07/2020  PCP: [name], PA-C  Admit date: 07/07/2020 Discharge date: 07/12/2020  Time spent: 40 minutes  Recommendations for Outpatient Follow-up:  1. Close cardiology and pcp f/u 2. Will need bmp to check kidney function and electrolytes within one week     Discharge Diagnoses:  Principal Problem:   Paroxysmal atrial fibrillation ([place]) Active Problems:   Dilated cardiomyopathy ([place])   Bronchial pneumonia   Smoker   Diabetes mellitus type 2 with complications ([place])   Cardiomyopathy ([place])   Acute HFrEF (heart failure with reduced ejection fraction) ([place])   Discharge Condition: good  Diet recommendation: heart healthy  Filed Weights   07/07/20 1451 07/08/20 2218 07/12/20 0519  Weight: 114.3 kg 116.8 kg 111.9 kg    History of present illness:  Tracy Gardner a 55 y.o.malewith medical history significant forasthma, tobacco use disorder, erythrocytosis, type 2 diabetes, essential hypertension, hyperlipidemia who [facility] his [facility]Ps [facility] after being advised to present to the ED due to new onset A. fib with RVR. Patient reports for the last week he has had aproductive coughwith brownish, greenish sputum andshortness of breath with minimal exertion.No chest pain. No fever. Thought they were allergies symptoms. Ongoing tobacco use, half a pack a day. His cough has been more productive than hisusual chronic smoker's cough. No lower extremity edema or tenderness. He went to his primary care provider today for a regular checkup after recentlystarted on metformin for type 2 diabetes. While he was there vital signs were abnormal,his heart rate was in the 150s. Twelve-lead EKG revealed A. fib with RVR. Recommended to come to the ED for further evaluation. A. fib was confirmed. Hypoxic on exam requiring 2 to 3 L nasal cannula. Due to concern for PE he had a CTA chest which was negative for PE  butshowed bronchial pneumonia. Started on IV antibiotics empirically in the ED. Also received a dose of IV Solu-Medrol 125 mg x 1 due to wheezing likely from asthma exacerbation. EDP called for admission.  Hospital Course:  New onset A. fib with RVR HFrEF, dilated cardiomyopathy Heart rate in the 150s in the ED, twelve-lead EKG revealed A. fib with RVR, started on diltiazem drip. CHA2DS2-VASc score 2, relatively low bleeding risk. EF 35-40 on TTE. TEE cardioversion performed 4/29, successful but with dilated cardiomyopathy at risk for recurrence and overnight 5/1 flipped back into a fib with rvr at times though asymptomatic. TSH wnl. No PE on cta. Asthma exacerbation/pna likely contributory. Orthopnea resolved with lasix - amio started - cont bisoprolol - eliquis started - home enalapril/hctz on hold - diuresing with lasix 40 qd, needs bmp 1 week - consider addition of sglt2i at f/u if kidney function allows (vs waiting to start to see if ef recovers). Ditto for entresto  Acute hypoxic respiratory failure secondary to bronchialpneumonia and acute asthma exacerbation. Not on oxygen supplementation at baseline. Wheezing improved and symptomatically much improved. Now weaned off o2. Is now s/p 5 days steroids. - Continue 1 more day augmentin to complete 7 day course - fluticasone controller inhaler prescribed - pcp f/u  Type 2 diabetes with hyperglycemia Recently started on metformin. a1c 6.5 - ssi - hold metformin given chf and slight up-trend in gfr - consider sglt2i as above  AKI Cr 1.5 on presentation, baseline 1.1, 1.4 today with lasix - close f/u for repeat bmp, wife aware  Tobacco use disorder Reports he has cut down to half a pack a day  from 1 pack a day since the age of 62. Patient has received tobacco cessation counseling at bedside - Nicotine patch as needed.  Essential hypertension BP is currently at goal - bisoprolol as above - holding enalapril/hctz in  setting of low bp - lasix  Hyperlipidemia Resume home regimen  Suspected OSA Made pt aware likely contributes to current condition, needs outpatient sleep study  Procedures:  Tee cardioversion   Consultations:  cardiology  Discharge Exam: Vitals:   07/12/20 0807 07/12/20 1233  BP: 121/83 104/70  Pulse: 90 63  Resp:    Temp: 98 F (36.7 C) 98.4 F (36.9 C)  SpO2: 92% 90%    General exam: Appears calm and comfortable  Respiratory system: exp wheeze throughout improved, better air movement. Respiratory effort normal. Cardiovascular system: S1 & S2 heard, RRR. Soft systolic murmur Gastrointestinal system: Abdomen is obese, soft and nontender. No organomegaly or masses felt. Normal bowel sounds heard. Central nervous system: Alert and oriented. No focal neurological deficits. Extremities: Symmetric 5 x 5 power. Trace LE edema Skin: No rashes, lesions or ulcers Psychiatry: Judgement and insight appear normal. Mood & affect appropriate.  Discharge Instructions   Discharge Instructions    Amb referral to AFIB Clinic   Complete by: As directed    Diet - low sodium heart healthy   Complete by: As directed    Increase activity slowly   Complete by: As directed      Allergies as of 07/12/2020   No Known Allergies     Medication List    STOP taking these medications   enalapril 2.5 MG tablet Commonly known as: VASOTEC   Enalapril-hydroCHLOROthiazide 5-12.5 MG tablet   metFORMIN 500 MG tablet Commonly known as: GLUCOPHAGE     TAKE these medications   albuterol 108 (90 Base) MCG/ACT inhaler Commonly known as: VENTOLIN HFA Inhale 2 puffs into the lungs every 6 (six) hours as needed for wheezing.   albuterol 0.63 MG/3ML nebulizer solution Commonly known as: ACCUNEB   albuterol 108 (90 Base) MCG/ACT inhaler Commonly known as: VENTOLIN HFA 2 puffs q.i.d. p.r.n. short of breath, wheezing, or cough   amiodarone 200 MG tablet Commonly known as: Pacerone 2 tabs  by mouth twice daily for 11 days, then 1 tab by mouth daily   amoxicillin-clavulanate 875-125 MG tablet Commonly known as: AUGMENTIN Take 1 tablet by mouth every 12 (twelve) hours.   apixaban 5 MG Tabs tablet Commonly known as: ELIQUIS Take 1 tablet (5 mg total) by mouth 2 (two) times daily.   bisoprolol 5 MG tablet Commonly known as: ZEBETA Take 1 tablet (5 mg total) by mouth 2 (two) times daily.   fluticasone 44 MCG/ACT inhaler Commonly known as: FLOVENT HFA Inhale 2 puffs into the lungs 2 (two) times daily.   furosemide 40 MG tablet Commonly known as: LASIX Take 1 tablet (40 mg total) by mouth daily. Start taking on: Jul 13, 2020   nicotine 14 mg/24hr patch Commonly known as: NICODERM CQ - dosed in mg/24 hours Place 1 patch (14 mg total) onto the skin daily. Start taking on: Jul 13, 2020   rosuvastatin 10 MG tablet Commonly known as: CRESTOR Take 1 tablet (10 mg total) by mouth daily.      No Known Allergies  Follow-up Information    Grayland Ormond Hestle, PA-C Follow up.   Specialty: Family Medicine Contact information: [place] [address] [phone]        Minna Merritts, MD .  Specialty: Cardiology Contact information: [place] [address] [phone]                The results of significant diagnostics from this hospitalization (including imaging, microbiology, ancillary and laboratory) are listed below for reference.    Significant Diagnostic Studies: CT Angio Chest PE W and/or Wo Contrast  Result Date: 07/07/2020 CLINICAL DATA:  Shortness of breath.  Atrial fibrillation. EXAM: CT ANGIOGRAPHY CHEST WITH CONTRAST TECHNIQUE: Multidetector CT imaging of the chest was performed using the standard protocol during bolus administration of intravenous contrast. Multiplanar CT image reconstructions and MIPs were obtained to evaluate the vascular anatomy. CONTRAST:  32mL OMNIPAQUE IOHEXOL 350  MG/ML SOLN COMPARISON:  Chest radiography same day FINDINGS: Cardiovascular: Heart size upper limits of normal. Relatively mild coronary artery calcification seems to be present. Minimal aortic atherosclerotic calcification. Pulmonary arterial opacification is excellent. No pulmonary emboli. Mediastinum/Nodes: No mass or lymphadenopathy. Lungs/Pleura: No pulmonary edema or pleural effusion. The right lung is clear. There is mild hazy and patchy infiltrate in the left upper lobe and lingula consistent with bronchopneumonia. No dense consolidation, collapse or effusion. 4 mm subpleural nodule in the left lower lobe axial image 71. Upper Abdomen: Negative Musculoskeletal: Ordinary degenerative changes affect the thoracic spine. Chronic calcified disc herniation at T12-L1. Review of the MIP images confirms the above findings. IMPRESSION: 1. No pulmonary emboli. 2. Mild hazy and patchy infiltrate in the left upper lobe and lingula consistent with bronchopneumonia. No dense consolidation, collapse or effusion. 3. 4 mm subpleural nodule in the left lower lobe. This does not require further follow-up. 4. Mild coronary artery calcification and very minimal aortic atherosclerotic calcification. Electronically Signed   By: Nelson Chimes M.D.   On: 07/07/2020 11:19   DG Chest Port 1 View  Result Date: 07/07/2020 CLINICAL DATA:  Shortness of breath.  Atrial fibrillation. EXAM: PORTABLE CHEST 1 VIEW COMPARISON:  01/10/2018. FINDINGS: Mild cardiac enlargement. Pulmonary vascular congestion identified. No pleural effusions. No airspace opacification. Visualized osseous structures are unremarkable. IMPRESSION: Cardiac enlargement and pulmonary vascular congestion. Electronically Signed   By: Kerby Moors M.D.   On: 07/07/2020 10:46   ECHOCARDIOGRAM COMPLETE  Result Date: 07/07/2020    ECHOCARDIOGRAM REPORT   Patient Name:   Tracy Gardner Date of Exam: 07/07/2020 Medical Rec #:  [id number]    Height:       71.0 in Accession #:     [id number]   Weight:       255.0 lb Date of Birth:  Feb 14, 1966     BSA:          2.338 m Patient Age:    69 years     BP:           100/66 mmHg Patient Gender: M            HR:           100 bpm. Exam Location:  [facility] Procedure: 2D Echo, Color Doppler and Cardiac Doppler Indications:     Syncope R55  History:         Patient has no prior history of Echocardiogram examinations. No                  heart history listed in chart.  Sonographer:     Sherrie Sport RDCS (AE) Referring Phys:  [id number] [place] Diagnosing Phys: [name] MD IMPRESSIONS  1. Left ventricular ejection fraction, by estimation, is 35 to 40%. The  left ventricle has moderately decreased function. The left ventricle demonstrates global hypokinesis. The left ventricular internal cavity size was mildly dilated. Left ventricular diastolic parameters are indeterminate.  2. Right ventricular systolic function is normal. The right ventricular size is normal. There is normal pulmonary artery systolic pressure.  3. Left atrial size was mildly dilated. FINDINGS  Left Ventricle: Left ventricular ejection fraction, by estimation, is 35 to 40%. The left ventricle has moderately decreased function. The left ventricle demonstrates global hypokinesis. The left ventricular internal cavity size was mildly dilated. There is no left ventricular hypertrophy. Left ventricular diastolic parameters are indeterminate. Right Ventricle: The right ventricular size is normal. No increase in right ventricular wall thickness. Right ventricular systolic function is normal. There is normal pulmonary artery systolic pressure. The tricuspid regurgitant velocity is 1.70 m/s, and  with an assumed right atrial pressure of 5 mmHg, the estimated right ventricular systolic pressure is [id number] mmHg. Left Atrium: Left atrial size was mildly dilated. Right Atrium: Right atrial size was normal in size. Pericardium: There is no evidence of pericardial effusion. Mitral Valve: The mitral  valve is normal in structure. No evidence of mitral valve regurgitation. No evidence of mitral valve stenosis. Tricuspid Valve: The tricuspid valve is normal in structure. Tricuspid valve regurgitation is not demonstrated. No evidence of tricuspid stenosis. Aortic Valve: The aortic valve was not well visualized. Aortic valve regurgitation is not visualized. No aortic stenosis is present. Aortic valve mean gradient measures 1.3 mmHg. Aortic valve peak gradient measures 2.2 mmHg. Aortic valve area, by VTI measures 2.64 cm. Pulmonic Valve: The pulmonic valve was normal in structure. Pulmonic valve regurgitation is not visualized. No evidence of pulmonic stenosis. Aorta: The aortic root is normal in size and structure. Venous: The inferior vena cava is normal in size with greater than 50% respiratory variability, suggesting right atrial pressure of 3 mmHg. IAS/Shunts: No atrial level shunt detected by color flow Doppler.  LEFT VENTRICLE PLAX 2D LVIDd:         5.91 cm      Diastology LVIDs:         4.75 cm      LV e' medial:   8.92 cm/s LV PW:         1.05 cm      LV E/e' medial: 6.9 LV IVS:        0.85 cm LVOT diam:     2.10 cm LV SV:         26 LV SV Index:   11 LVOT Area:     3.46 cm  LV Volumes (MOD) LV vol d, MOD A2C: 143.0 ml LV vol d, MOD A4C: 121.0 ml LV vol s, MOD A2C: 82.9 ml LV vol s, MOD A4C: 86.4 ml LV SV MOD A2C:     60.1 ml LV SV MOD A4C:     121.0 ml LV SV MOD BP:      48.4 ml RIGHT VENTRICLE RV Basal diam:  3.32 cm LEFT ATRIUM             Index       RIGHT ATRIUM           Index LA diam:        4.80 cm 2.05 cm/m  RA Area:     20.00 cm LA Vol (A2C):   49.3 ml 21.09 ml/m RA Volume:   57.00 ml  24.38 ml/m LA Vol (A4C):   50.3 ml 21.51 ml/m LA Biplane Vol: 51.1 ml 21.86  ml/m  AORTIC VALVE                   PULMONIC VALVE AV Area (Vmax):    2.10 cm    PV Vmax:        0.52 m/s AV Area (Vmean):   2.05 cm    PV Peak grad:   1.1 mmHg AV Area (VTI):     2.64 cm    RVOT Peak grad: 1 mmHg AV Vmax:            74.13 cm/s AV Vmean:          52.967 cm/s AV VTI:            0.100 m AV Peak Grad:      2.2 mmHg AV Mean Grad:      1.3 mmHg LVOT Vmax:         45.00 cm/s LVOT Vmean:        31.400 cm/s LVOT VTI:          0.076 m LVOT/AV VTI ratio: 0.76  AORTA Ao Root diam: 2.70 cm MITRAL VALVE               TRICUSPID VALVE MV Area (PHT): 5.97 cm    TR Peak grad:   11.6 mmHg MV Decel Time: 127 msec    TR Vmax:        170.00 cm/s MV E velocity: 61.70 cm/s MV A velocity: 80.60 cm/s  SHUNTS MV E/A ratio:  0.77        Systemic VTI:  0.08 m                            Systemic Diam: 2.10 cm [name] MD Electronically signed by [name] MD Signature Date/Time: 07/07/2020/2:44:37 PM    Final    ECHO TEE  Result Date: 07/08/2020    TRANSESOPHOGEAL ECHO REPORT   Patient Name:   Tracy Gardner Date of Exam: 07/08/2020 Medical Rec #:  [id number]    Height:       71.0 in Accession #:    [id number]   Weight:       252.0 lb Date of Birth:  1965-03-16     BSA:          2.326 m Patient Age:    86 years     BP:           98/63 mmHg Patient Gender: M            HR:           59 bpm. Exam Location:  [facility] Procedure: Transesophageal Echo, Cardiac Doppler, Color Doppler and [place]            Contrast Bubble Study Indications:     Atrial fibrillation 427.31 / I48.91  History:         Patient has prior history of Echocardiogram examinations, most                  recent 07/07/2020.  Sonographer:     Sherrie Sport RDCS (AE) Referring Phys:  [place] Diagnosing Phys: [name] MD PROCEDURE: After discussion of the risks and benefits of a TEE, an informed consent was obtained from the patient. TEE procedure time was 30 minutes. The transesophogeal probe was passed without difficulty through the esophogus of the patient. Imaged were obtained with the patient in a left lateral decubitus position. Local  oropharyngeal anesthetic was provided with Cetacaine and Benzocaine spray. Sedation performed by performing physician. Image  quality was excellent. The patient's vital signs; including heart rate, blood pressure, and oxygen saturation; remained stable throughout the procedure. The patient developed no complications during the procedure. A successful direct current cardioversion was performed at 150 joules with 1 attempt. IMPRESSIONS  1. Left ventricular ejection fraction, by estimation, is 30 to 35%. The left ventricle has moderately decreased function. The left ventricle has no regional wall motion abnormalities. The left ventricular internal cavity size was mildly dilated. Left ventricular diastolic function could not be evaluated.  2. Right ventricular systolic function is normal. The right ventricular size is normal.  3. Left atrial size was moderately dilated. No left atrial/left atrial appendage thrombus was detected.  4. The mitral valve is normal in structure. Mild mitral valve regurgitation. No evidence of mitral stenosis.  5. The aortic valve is normal in structure. Aortic valve regurgitation is not visualized. No aortic stenosis is present.  6. There is mild (Grade II) atheroma plaque involving the transverse aorta and descending aorta.  7. The inferior vena cava is normal in size with greater than 50% respiratory variability, suggesting right atrial pressure of 3 mmHg.  8. Agitated saline contrast bubble study was negative, with no evidence of any interatrial shunt. Conclusion(s)/Recommendation(s): Normal biventricular function without evidence of hemodynamically significant valvular heart disease. FINDINGS  Left Ventricle: Left ventricular ejection fraction, by estimation, is 30 to 35%. The left ventricle has moderately decreased function. The left ventricle has no regional wall motion abnormalities. The left ventricular internal cavity size was mildly dilated. There is no left ventricular hypertrophy. Left ventricular diastolic function could not be evaluated. Right Ventricle: The right ventricular size is normal. No  increase in right ventricular wall thickness. Right ventricular systolic function is normal. Left Atrium: Left atrial size was moderately dilated. Spontaneous echo contrast was present. No left atrial/left atrial appendage thrombus was detected. Right Atrium: Right atrial size was normal in size. Pericardium: There is no evidence of pericardial effusion. Mitral Valve: The mitral valve is normal in structure. Mild mitral valve regurgitation. No evidence of mitral valve stenosis. Tricuspid Valve: The tricuspid valve is normal in structure. Tricuspid valve regurgitation is mild . No evidence of tricuspid stenosis. Aortic Valve: The aortic valve is normal in structure. Aortic valve regurgitation is not visualized. No aortic stenosis is present. Pulmonic Valve: The pulmonic valve was normal in structure. Pulmonic valve regurgitation is not visualized. No evidence of pulmonic stenosis. Aorta: The aortic root is normal in size and structure. There is mild (Grade II) atheroma plaque involving the transverse aorta and descending aorta. Venous: The inferior vena cava is normal in size with greater than 50% respiratory variability, suggesting right atrial pressure of 3 mmHg. IAS/Shunts: No atrial level shunt detected by color flow Doppler. Agitated saline contrast was given intravenously to evaluate for intracardiac shunting. Agitated saline contrast bubble study was negative, with no evidence of any interatrial shunt. [name] MD Electronically signed by [name] MD Signature Date/Time: 07/08/2020/4:30:30 PM    Final     Microbiology: Recent Results (from the past 240 hour(s))  Resp Panel by RT-PCR (Flu A&B, Covid) Nasopharyngeal Swab     Status: None   Collection Time: 07/07/20 10:11 AM   Specimen: Nasopharyngeal Swab; Nasopharyngeal(NP) swabs in vial transport medium  Result Value Ref Range Status   SARS Coronavirus 2 by RT PCR NEGATIVE NEGATIVE Final    Comment: (NOTE) SARS-CoV-2 target nucleic acids  are NOT DETECTED.  The SARS-CoV-2 RNA is generally detectable in upper respiratory specimens during the acute phase of infection. The lowest concentration of SARS-CoV-2 viral copies this assay can detect is 138 copies/mL. A negative result does not preclude SARS-Cov-2 infection and should not be used as the sole basis for treatment or other patient management decisions. A negative result may occur with  improper specimen collection/handling, submission of specimen other than nasopharyngeal swab, presence of viral mutation(s) within the areas targeted by this assay, and inadequate number of viral copies(<138 copies/mL). A negative result must be combined with clinical observations, patient history, and epidemiological information. The expected result is Negative.  Fact Sheet for Patients:  EntrepreneurPulse.com.au  Fact Sheet for Healthcare Providers:  IncredibleEmployment.be  This test is no t yet approved or cleared by the [place] FDA and  has been authorized for detection and/or diagnosis of SARS-CoV-2 by FDA under an Emergency Use Authorization (EUA). This EUA will remain  in effect (meaning this test can be used) for the duration of the COVID-19 declaration under Section 564(b)(1) of the Act, 21 U.S.C.section 360bbb-3(b)(1), unless the authorization is terminated  or revoked sooner.       Influenza A by PCR NEGATIVE NEGATIVE Final   Influenza B by PCR NEGATIVE NEGATIVE Final    Comment: (NOTE) The Xpert Xpress SARS-CoV-2/FLU/RSV plus assay is intended as an aid in the diagnosis of influenza from Nasopharyngeal swab specimens and should not be used as a sole basis for treatment. Nasal washings and aspirates are unacceptable for Xpert Xpress SARS-CoV-2/FLU/RSV testing.  Fact Sheet for Patients: EntrepreneurPulse.com.au  Fact Sheet for Healthcare Providers: IncredibleEmployment.be  This test is  not yet approved or cleared by the [place] FDA and has been authorized for detection and/or diagnosis of SARS-CoV-2 by FDA under an Emergency Use Authorization (EUA). This EUA will remain in effect (meaning this test can be used) for the duration of the COVID-19 declaration under Section 564(b)(1) of the Act, 21 U.S.C. section 360bbb-3(b)(1), unless the authorization is terminated or revoked.  Performed at [facility], [address] [place], [place] [address]   Blood culture (routine x 2)     Status: None   Collection Time: 07/07/20 12:25 PM   Specimen: BLOOD  Result Value Ref Range Status   Specimen Description BLOOD RIGHT ANTECUBITAL  Final   Special Requests   Final    BOTTLES DRAWN AEROBIC AND ANAEROBIC Blood Culture adequate volume   Culture   Final    NO GROWTH 5 DAYS Performed at [facility], [address] [place], [place] [address]    Report Status 07/12/2020 FINAL  Final  Blood culture (routine x 2)     Status: None   Collection Time: 07/07/20 12:25 PM   Specimen: BLOOD  Result Value Ref Range Status   Specimen Description BLOOD BLOOD RIGHT HAND  Final   Special Requests   Final    BOTTLES DRAWN AEROBIC AND ANAEROBIC Blood Culture adequate volume   Culture   Final    NO GROWTH 5 DAYS Performed at [facility], [address], [place], [place] [address]    Report Status 07/12/2020 FINAL  Final  Expectorated Sputum Assessment w Gram Stain, Rflx to Resp Cult     Status: None   Collection Time: 07/07/20 12:28 PM   Specimen: Expectorated Sputum  Result Value Ref Range Status   Specimen Description EXPECTORATED SPUTUM  Final   Special Requests Normal  Final   Sputum  evaluation   Final    THIS SPECIMEN IS ACCEPTABLE FOR SPUTUM CULTURE Performed at [facility], [address] [place], [place] [address]    Report Status 07/07/2020 FINAL  Final  Culture, Respiratory w Gram Stain     Status: None   Collection Time:  07/07/20 12:28 PM  Result Value Ref Range Status   Specimen Description   Final    EXPECTORATED SPUTUM Performed at [facility], [address] [place], [place] [address]    Special Requests   Final    Normal Reflexed from [id number] Performed at [facility], [address] [place], [place] [address]    Gram Stain   Final    ABUNDANT WBC PRESENT, PREDOMINANTLY PMN ABUNDANT GRAM NEGATIVE RODS FEW GRAM POSITIVE COCCI    Culture   Final    FEW Normal respiratory flora-no Staph aureus or Pseudomonas seen Performed at [facility], [place] [address] [place], [place] [address]    Report Status 07/10/2020 FINAL  Final  MRSA PCR Screening     Status: None   Collection Time: 07/07/20  2:54 PM   Specimen: Nasopharyngeal  Result Value Ref Range Status   MRSA by PCR NEGATIVE NEGATIVE Final    Comment:        The GeneXpert MRSA Assay (FDA approved for NASAL specimens only), is one component of a comprehensive MRSA colonization surveillance program. It is not intended to diagnose MRSA infection nor to guide or monitor treatment for MRSA infections. Performed at [facility], [address] [place], [place] [address]      Labs: Basic Metabolic Panel: Recent Labs  Lab 07/07/20 1010 07/07/20 1226 07/08/20 0536 07/09/20 0410 07/10/20 0411 07/11/20 0547 07/12/20 0517  NA 136  --  134* 135 137 139 138  K 4.3  --  4.9 4.8 4.8 4.2 4.1  CL 101  --  103 103 105 102 100  CO2 26  --  23 23 25 28 28   GLUCOSE 110*  --  169* 130* 106* 88 99  BUN 17  --  23* 35* 32* 33* 35*  CREATININE 1.08   < > 1.12 1.51* 1.15 1.16 1.42*  CALCIUM 8.8*  --  8.4* 8.6* 8.6* 8.7* 8.9  MG 1.6*  --  1.7  --   --   --   --   PHOS  --   --  4.9*  --   --   --   --    < > = values in this interval not displayed.   Liver Function Tests: Recent Labs  Lab 07/07/20 1010  AST 18  ALT 19  ALKPHOS 65  BILITOT 1.2  PROT 7.2  ALBUMIN 3.5   No results for input(s): LIPASE,  AMYLASE in the last 168 hours. No results for input(s): AMMONIA in the last 168 hours. CBC: Recent Labs  Lab 07/07/20 1010 07/08/20 0536 07/09/20 0410  WBC 11.2* 14.1* 18.8*  HGB 17.0 15.4 15.5  HCT 53.4* 48.7 49.2  MCV 87.5 88.5 89.1  PLT 203 174 203   Cardiac Enzymes: No results for input(s): CKTOTAL, CKMB, CKMBINDEX, TROPONINI in the last 168 hours. BNP: BNP (last 3 results) Recent Labs    07/07/20 1010  BNP 240.2*    ProBNP (last 3 results) No results for input(s): PROBNP in the last 8760 hours.  CBG: Recent Labs  Lab 07/11/20 1136 07/11/20 1635 07/11/20 2059 07/12/20 0809 07/12/20 1226  GLUCAP 138* 249* 234* 120*  140*       Signed:  Desma Maxim MD.  [facility] 07/12/2020, 1:31 PM

## 2020-07-12 NOTE — Progress Notes (Signed)
Progress Note  Patient Name: Tracy Gardner Date of Encounter: 07/12/2020  [facility] Cardiologist: [name], MD   Subjective   Patient feels well, denying chest pain, shortness of breath, and palpitations.  Inpatient Medications    Scheduled Meds: . amiodarone  400 mg Oral BID  . amoxicillin-clavulanate  1 tablet Oral Q12H  . apixaban  5 mg Oral BID  . bisoprolol  5 mg Oral BID  . Chlorhexidine Gluconate Cloth  6 each Topical Daily  . [START ON 07/13/2020] furosemide  40 mg Oral Daily  . insulin aspart  0-5 Units Subcutaneous QHS  . insulin aspart  0-9 Units Subcutaneous TID WC  . ipratropium-albuterol  3 mL Nebulization TID  . nicotine  14 mg Transdermal Daily  . sodium chloride flush  10 mL Intravenous Q12H   Continuous Infusions:  PRN Meds: acetaminophen, digoxin, melatonin, ondansetron (ZOFRAN) IV   Vital Signs    Vitals:   07/12/20 0519 07/12/20 0744 07/12/20 0807 07/12/20 1233  BP: 125/87  121/83 104/70  Pulse: 95  90 63  Resp: 18     Temp:   98 F (36.7 C) 98.4 F (36.9 C)  TempSrc: Oral  Oral Oral  SpO2: 94% 96% 92% 90%  Weight: 111.9 kg     Height:        Intake/Output Summary (Last 24 hours) at 07/12/2020 1845 Last data filed at 07/12/2020 1330 Gross per 24 hour  Intake 960 ml  Output 2400 ml  Net -1440 ml   Last 3 Weights 07/12/2020 07/08/2020 07/07/2020  Weight (lbs) 246 lb 9.6 oz 257 lb 6.4 oz 251 lb 15.8 oz  Weight (kg) 111.857 kg 116.756 kg 114.3 kg      Telemetry    Atrial fibrillation with ventricular rates of 80 to 120 bpm - Personally Reviewed  ECG    No new tracing.  Physical Exam   GEN: No acute distress.   Neck: No JVD Cardiac: Irregularly irregular; no murmurs, rubs, or gallops.  Respiratory: Clear to auscultation bilaterally. GI: Soft, nontender, non-distended  MS: No edema; No deformity. Neuro:  Nonfocal  Psych: Normal affect   Labs    High Sensitivity Troponin:   Recent Labs  Lab 07/07/20 1010 07/07/20 1226   TROPONINIHS 17 16      Chemistry Recent Labs  Lab 07/07/20 1010 07/07/20 1226 07/10/20 0411 07/11/20 0547 07/12/20 0517  NA 136   < > 137 139 138  K 4.3   < > 4.8 4.2 4.1  CL 101   < > 105 102 100  CO2 26   < > 25 28 28   GLUCOSE 110*   < > 106* 88 99  BUN 17   < > 32* 33* 35*  CREATININE 1.08   < > 1.15 1.16 1.42*  CALCIUM 8.8*   < > 8.6* 8.7* 8.9  PROT 7.2  --   --   --   --   ALBUMIN 3.5  --   --   --   --   AST 18  --   --   --   --   ALT 19  --   --   --   --   ALKPHOS 65  --   --   --   --   BILITOT 1.2  --   --   --   --   GFRNONAA >60   < > >60 >60 58*  ANIONGAP 9   < > 7 9  10   < > = values in this interval not displayed.     Hematology Recent Labs  Lab 07/07/20 1010 07/08/20 0536 07/09/20 0410  WBC 11.2* 14.1* 18.8*  RBC 6.10* 5.50 5.52  HGB 17.0 15.4 15.5  HCT 53.4* 48.7 49.2  MCV 87.5 88.5 89.1  MCH 27.9 28.0 28.1  MCHC 31.8 31.6 31.5  RDW 14.4 14.4 14.8  PLT 203 174 203    BNP Recent Labs  Lab 07/07/20 1010  BNP 240.2*     DDimer No results for input(s): DDIMER in the last 168 hours.   Radiology    No results found.  Cardiac Studies   Echo 07/07/20 1. Left ventricular ejection fraction, by estimation, is 35 to 40%. The  left ventricle has moderately decreased function. The left ventricle  demonstrates global hypokinesis. The left ventricular internal cavity size  was mildly dilated. Left ventricular  diastolic parameters are indeterminate.  2. Right ventricular systolic function is normal. The right ventricular  size is normal. There is normal pulmonary artery systolic pressure.  3. Left atrial size was mildly dilated.   Patient Profile     55 y.o. male with pmh of HTN, HLD, DM2, CKD stage 2a, tob abuse, asthma, obesity and polycythemia who was admitted 4/28 with respiratory failure, LUL pNA, afib RVR s/p TEE/DCCV.  Assessment & Plan    Paroxysmal atrial fibrillation: Patient has remained in atrial fibrillation overnight  though ventricular rates are mostly adequately controlled.  Continue bisoprolol 5 mg twice daily  Continue amiodarone 400 mg twice daily until 10 g load has been continued then transition to 200 mg daily.  Continue apixaban 5 mg twice daily.  Anticipate outpatient cardioversion if patient remains in atrial fibrillation after having completed amiodarone load.  Acute HFrEF: Patient appears euvolemic with no ongoing symptoms.  Continue bisoprolol 5 mg twice daily and furosemide 40 mg daily.  Defer adding ACE inhibitor/ARB at this time given soft blood pressures.  Hopefully, ACE inhibitor/ARB/ARN I can be added at follow-up.  Defer ischemia evaluation to outpatient setting.  Transition furosemide to 40 mg p.o. daily.  Acute kidney injury superimposed on chronic kidney disease: Creatinine improved.  Defer adding ACE inhibitor/ARB/ARNI to follow-up.  Patient is stable for discharge home today.  He should follow-up in the office in 1 to 2 weeks.  For questions or updates, please contact [place] Please consult [email or web address] for contact info under [facility].     Signed, Nelva Bush, MD  07/12/2020, 6:45 PM

## 2020-07-12 NOTE — Progress Notes (Signed)
Discharge instructions explainer to pt and wife, Janet/both verbalized understanding. IV and tele removed. Will transport of unit via wheelchair.

## 2020-07-14 ENCOUNTER — Other Ambulatory Visit: Payer: Self-pay | Admitting: *Deleted

## 2020-07-14 ENCOUNTER — Encounter: Payer: Self-pay | Admitting: *Deleted

## 2020-07-14 NOTE — Patient Outreach (Signed)
[facility]) Care Management  07/14/2020  Tracy Gardner September 05, 1965 [id number]   Transition of care call/case closure   Referral received:07/08/20 Initial outreach:07/14/20 Insurance: [name]    Subjective: Initial successful telephone call to patient's preferred number in order to complete transition of care assessment; 2 HIPAA identifiers verified. Explained purpose of call and completed transition of care assessment. Patient has his wife Tracy Gardner on speaker phone. Patient states that he is working on getting stronger, slow to get energy back. He denies worsening shortness of breath reports he discussed having cardioversion getting him out of atrial fib but he returned to irregular beat. He reports monitoring heart rate at home currently this am he is at 56. He is tolerating diet,watching salt his wife is a Therapist, sports  and helping with meal planning, limiitng salt. He continues to track daily weights and reports weight staying 242-244, reviewed heart failure worsening symptoms and action plan. Patient states that he is remaining not smoking, declined need for additional support resources.  Spouse is assisting with his recovery.   Reviewed accessing the following [place] Benefits : Patient/wife discussed any ongoing health issues of hypertension, diabetes patient is agreeable to  referral to one of the [place] chronic disease management programs.  Discussed [place]  benefit of wellness programs such as smoking cessation available , wife agreeable to review website. She does not t have the hospital indemnity Patient does not use a  [facility] outpatient pharmacy, discussed that there may be some cost saving. Wife discussed initial cost of Eliquis at [facility]  was $160, she plans to discuss at cardiology visit next week  as well as contact insurance/medimpact.  .    Objective:   Mr. Tracy Gardner  was hospitalized at [facility] 4/28-07/12/20 for New onset of  Atrial Fib,s/p TEE DCCV, LUL Bronchial Pneumonia, Dilated Cardiomyopathy Comorbidities include: Diabetes type 2( Hemoglobin 6.5% on 4/28)  hypertension , hyperlipidemia,  He was discharged to home on 07/12/20 without the need for home health services or DME.   Assessment:  Patient voices good understanding of all discharge instructions.  See transition of care flowsheet for assessment details.   Plan:  Reviewed hospital discharge diagnosis of Atrial Fib, Reduced Ejection fraction,   and discharge treatment plan using hospital discharge instructions, assessing medication adherence, reviewing problems requiring provider notification, and discussing the importance of follow up with primary care provider and/or specialists as directed.  Reviewed [place] healthy lifestyle program information to receive discounted premium for  2023   Step 1: Get  your annual physical  Step 2: Complete your health assessment  Step 3:Identify your current health status and complete the corresponding action step between March 12, 2020 and November 10, 2020.    Using [place] website, with patient agreement enrolled to  participate in [place]'s Active Health Management chronic disease management program.    No ongoing care management needs identified so will close case to [place] Management services and route successful outreach letter with [place] Management pamphlet and 24 Hour Nurse Line Magnet to [place] Management clinical pool to be mailed to patient's home address.  Thanked patient wife  for her services to [facility].  Tracy Draft, RN, BSN  [place] Management Coordinator  [phone]- Mobile [phone]- Toll Free Main Office

## 2020-07-19 ENCOUNTER — Ambulatory Visit: Payer: Self-pay | Admitting: *Deleted

## 2020-07-22 ENCOUNTER — Ambulatory Visit: Payer: 59 | Admitting: Medical

## 2020-07-22 ENCOUNTER — Encounter: Payer: Self-pay | Admitting: Medical

## 2020-07-22 ENCOUNTER — Other Ambulatory Visit: Payer: Self-pay

## 2020-07-22 VITALS — BP 112/68 | HR 75 | Ht 71.0 in | Wt 259.0 lb

## 2020-07-22 DIAGNOSIS — E118 Type 2 diabetes mellitus with unspecified complications: Secondary | ICD-10-CM | POA: Diagnosis not present

## 2020-07-22 DIAGNOSIS — N179 Acute kidney failure, unspecified: Secondary | ICD-10-CM

## 2020-07-22 DIAGNOSIS — R0683 Snoring: Secondary | ICD-10-CM | POA: Diagnosis not present

## 2020-07-22 DIAGNOSIS — E782 Mixed hyperlipidemia: Secondary | ICD-10-CM

## 2020-07-22 DIAGNOSIS — I5022 Chronic systolic (congestive) heart failure: Secondary | ICD-10-CM

## 2020-07-22 DIAGNOSIS — I480 Paroxysmal atrial fibrillation: Secondary | ICD-10-CM | POA: Diagnosis not present

## 2020-07-22 DIAGNOSIS — I10 Essential (primary) hypertension: Secondary | ICD-10-CM

## 2020-07-22 MED ORDER — AMIODARONE HCL 200 MG PO TABS: 200.0000 mg | ORAL_TABLET | Freq: Every day | ORAL | 1 refills | Status: DC

## 2020-07-22 NOTE — Patient Instructions (Addendum)
Medication Instructions:   1.  TAKE Amiodarone 200 MG once a day.   *If you need a refill on your cardiac medications before your next appointment, please call your pharmacy*   Lab Work:  CBC and BMP to be drawn today   If you have labs (blood work) drawn today and your tests are completely normal, you will receive your results only by: Marland Kitchen MyChart Message (if you have MyChart) OR . A paper copy in the mail If you have any lab test that is abnormal or we need to change your treatment, we will call you to review the results.   Testing/Procedures:  You are scheduled for a Cardioversion on _Friday May 27th_______________ with Dr._Gollan__________ Please arrive at the [facility] at __0630_______ a.m. on the day of your procedure.   DIET INSTRUCTIONS:  Nothing to eat or drink after midnight except your medications with a sip of water.         1) Labs: ____drawn in office 5/13/22______________  2) Medications:  YOU MAY TAKE ALL of your remaining medications with a small amount of water.  3) Must have a responsible person to drive you home.  4) Bring a current list of your medications and current insurance cards.    If you have any questions after you get home, please call the office at [phone]   Follow-Up: At [facility], you and your health needs are our priority.  As part of our continuing mission to provide you with exceptional heart care, we have created designated Provider Care Teams.  These Care Teams include your primary Cardiologist (physician) and Advanced Practice Providers (APPs -  Physician Assistants and Nurse Practitioners) who all work together to provide you with the care you need, when you need it.  We recommend signing up for the patient portal called "MyChart".  Sign up information is provided on this After Visit Summary.  MyChart is used to connect with patients for Virtual Visits (Telemedicine).  Patients are able to view lab/test results,  encounter notes, upcoming appointments, etc.  Non-urgent messages can be sent to your provider as well.   To learn more about what you can do with MyChart, go to [email or web address].    Your next appointment:   3 week(s)  The format for your next appointment:   In Person  Provider:   You may see [name], MD or one of the following Advanced Practice Providers on your designated Care Team:    Murray Hodgkins, NP  Christell Faith, PA-C  Marrianne Mood, PA-C  Cadence Fordyce, [place]  Laurann Montana, NP    Other Instructions
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
## 2020-07-23 LAB — BASIC METABOLIC PANEL
BUN/Creatinine Ratio: 12 (ref 9–20)
BUN: 18 mg/dL (ref 6–24)
CO2: 25 mmol/L (ref 20–29)
Calcium: 8.8 mg/dL (ref 8.7–10.2)
Chloride: 103 mmol/L (ref 96–106)
Creatinine, Ser: 1.47 mg/dL — ABNORMAL HIGH (ref 0.76–1.27)
Glucose: 104 mg/dL — ABNORMAL HIGH (ref 65–99)
Potassium: 4.6 mmol/L (ref 3.5–5.2)
Sodium: 142 mmol/L (ref 134–144)
eGFR: 56 mL/min/{1.73_m2} — ABNORMAL LOW (ref >59)

## 2020-07-23 LAB — CBC
Hematocrit: 50.7 % (ref 37.5–51.0)
Hemoglobin: 16.5 g/dL (ref 13.0–17.7)
MCH: 27.6 pg (ref 26.6–33.0)
MCHC: 32.5 g/dL (ref 31.5–35.7)
MCV: 85 fL (ref 79–97)
Platelets: 184 10*3/uL (ref 150–450)
RBC: 5.97 x10E6/uL — ABNORMAL HIGH (ref 4.14–5.80)
RDW: 14 % (ref 11.6–15.4)
WBC: 8.3 10*3/uL (ref 3.4–10.8)

## 2020-07-26 DIAGNOSIS — I480 Paroxysmal atrial fibrillation: Secondary | ICD-10-CM | POA: Diagnosis not present

## 2020-07-26 DIAGNOSIS — J4520 Mild intermittent asthma, uncomplicated: Secondary | ICD-10-CM | POA: Diagnosis not present

## 2020-07-26 DIAGNOSIS — I10 Essential (primary) hypertension: Secondary | ICD-10-CM | POA: Diagnosis not present

## 2020-07-27 ENCOUNTER — Ambulatory Visit: Payer: 59 | Admitting: Primary Care

## 2020-07-27 ENCOUNTER — Encounter: Payer: Self-pay | Admitting: Primary Care

## 2020-07-27 ENCOUNTER — Other Ambulatory Visit: Payer: Self-pay

## 2020-07-27 VITALS — BP 100/70 | HR 72 | Temp 97.0°F | Ht 71.0 in | Wt 256.4 lb

## 2020-07-27 DIAGNOSIS — R0683 Snoring: Secondary | ICD-10-CM | POA: Diagnosis not present
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
## 2020-07-27 NOTE — Assessment & Plan Note (Signed)
-   Strong suspicion for underlying obstructive sleep apnea. He reports symptoms of loud snoring, witness apnea, restless sleep and daytime sleepiness. Epworth score 15/24. He fell asleep several times momentarily during today's visit, easily awakens to verbal cues. Recommending in-lab split night sleep study d/t hx respiratory failure and asthma. We discussed risks of untreated sleep apnea including cardiac arrhythmias, stroke, pulm HTN, DM. Briefly reviewed treatment options including weight loss, side sleeping position, oral appliance, CPAP therapy or referral to ENT for possible surgical options. Advised him against driving if experiencing excessive daytime sleepiness. Encourage weight loss efforts. FU in 4-6 weeks to review sleep study results.

## 2020-07-27 NOTE — Progress Notes (Signed)
Reviewed and agree with assessment/plan.   Chesley Mires, MD [facility] Pulmonary/Critical Care 07/27/2020, 1:58 PM Pager:  [phone]

## 2020-07-27 NOTE — Patient Instructions (Signed)
Strongly suspect you have underlying sleep apnea, we will need to check either in in-lab or home sleep study. I will try my best to get this scheduled before your cardiac procedure end of May but can not guarantee   Risk of untreated sleep apnea include cardiac arrhythmias, pulmonary HTN, stroke, diabetes and increased risk for daytime accidents  Do not drive if experiencing excessive daytime sleepiness  Recommend you sleep on your side or elevate head of bed at night  Work on weight loss   Follow-up: 4-6 weeks televisit to review sleep study results    Sleep Apnea Sleep apnea affects breathing during sleep. It causes breathing to stop for a short time or to become shallow. It can also increase the risk of:  Heart attack.  Stroke.  Being very overweight (obese).  Diabetes.  Heart failure.  Irregular heartbeat. The goal of treatment is to help you breathe normally again. What are the causes? There are three kinds of sleep apnea:  Obstructive sleep apnea. This is caused by a blocked or collapsed airway.  Central sleep apnea. This happens when the brain does not send the right signals to the muscles that control breathing.  Mixed sleep apnea. This is a combination of obstructive and central sleep apnea. The most common cause of this condition is a collapsed or blocked airway. This can happen if:  Your throat muscles are too relaxed.  Your tongue and tonsils are too large.  You are overweight.  Your airway is too small.   What increases the risk?  Being overweight.  Smoking.  Having a small airway.  Being older.  Being male.  Drinking alcohol.  Taking medicines to calm yourself (sedatives or tranquilizers).  Having family members with the condition. What are the signs or symptoms?  Trouble staying asleep.  Being sleepy or tired during the day.  Getting angry a lot.  Loud snoring.  Headaches in the morning.  Not being able to focus your mind  (concentrate).  Forgetting things.  Less interest in sex.  Mood swings.  Personality changes.  Feelings of sadness (depression).  Waking up a lot during the night to pee (urinate).  Dry mouth.  Sore throat. How is this diagnosed?  Your medical history.  A physical exam.  A test that is done when you are sleeping (sleep study). The test is most often done in a sleep lab but may also be done at home. How is this treated?  Sleeping on your side.  Using a medicine to get rid of mucus in your nose (decongestant).  Avoiding the use of alcohol, medicines to help you relax, or certain pain medicines (narcotics).  Losing weight, if needed.  Changing your diet.  Not smoking.  Using a machine to open your airway while you sleep, such as: ? An oral appliance. This is a mouthpiece that shifts your lower jaw forward. ? A CPAP device. This device blows air through a mask when you breathe out (exhale). ? An EPAP device. This has valves that you put in each nostril. ? A BPAP device. This device blows air through a mask when you breathe in (inhale) and breathe out.  Having surgery if other treatments do not work. It is important to get treatment for sleep apnea. Without treatment, it can lead to:  High blood pressure.  Coronary artery disease.  In men, not being able to have an erection (impotence).  Reduced thinking ability.   Follow these instructions at home: Lifestyle  Make  changes that your doctor recommends.  Eat a healthy diet.  Lose weight if needed.  Avoid alcohol, medicines to help you relax, and some pain medicines.  Do not use any products that contain nicotine or tobacco, such as cigarettes, e-cigarettes, and chewing tobacco. If you need help quitting, ask your doctor. General instructions  Take over-the-counter and prescription medicines only as told by your doctor.  If you were given a machine to use while you sleep, use it only as told by your  doctor.  If you are having surgery, make sure to tell your doctor you have sleep apnea. You may need to bring your device with you.  Keep all follow-up visits as told by your doctor. This is important. Contact a doctor if:  The machine that you were given to use during sleep bothers you or does not seem to be working.  You do not get better.  You get worse. Get help right away if:  Your chest hurts.  You have trouble breathing in enough air.  You have an uncomfortable feeling in your back, arms, or stomach.  You have trouble talking.  One side of your body feels weak.  A part of your face is hanging down. These symptoms may be an emergency. Do not wait to see if the symptoms will go away. Get medical help right away. Call your local emergency services (911 in the [place] Do not drive yourself to the hospital. Summary  This condition affects breathing during sleep.  The most common cause is a collapsed or blocked airway.  The goal of treatment is to help you breathe normally while you sleep. This information is not intended to replace advice given to you by your health care provider. Make sure you discuss any questions you have with your health care provider. Document Revised: 12/13/2017 Document Reviewed: 10/22/2017 Elsevier Patient Education  [place].

## 2020-08-02 ENCOUNTER — Telehealth: Payer: Self-pay

## 2020-08-02 DIAGNOSIS — R7989 Other specified abnormal findings of blood chemistry: Secondary | ICD-10-CM

## 2020-08-02 NOTE — Telephone Encounter (Signed)
Able to reach pt regarding his recent lab work, [employer], PA-C had a chance to review his results and advised   "Kidney function still up from baseline, but not worse. Lets try to go down to lasix 20mg  daily and give CHF instructions to take daily weights and if weights are up3 lbs overnight or 5 lbs in a week take an extra lasix. Re-check BMET in a week"  Mr. Daubenspeck and wife verbalized understanding, will upload lasix instructions to MyChart for instruction review. Pt understands to go to [place] next week for repeat BMP.  Otherwise, all questions or concerns were address and no additional concerns at this time. Agreeable to plan, will call back for anything further.

## 2020-08-04 ENCOUNTER — Telehealth: Payer: Self-pay | Admitting: Internal Medicine

## 2020-08-04 ENCOUNTER — Other Ambulatory Visit: Payer: Self-pay | Admitting: Cardiovascular Disease

## 2020-08-04 NOTE — Telephone Encounter (Signed)
Patient called to cancel his appt for lab and poss phleb on 5/27 as he is having a cardioversion done on this day and he was advised to hold off. Patient stated that he is having lab work done tomorrow prior to procedure and he will call back if phleb is in fact needed.   Reminded patient of his subsequent appointment on 09/30/20.  Routing to team to make aware.

## 2020-08-05 ENCOUNTER — Other Ambulatory Visit: Payer: Self-pay

## 2020-08-05 ENCOUNTER — Ambulatory Visit: Payer: 59 | Admitting: Certified Registered Nurse Anesthetist

## 2020-08-05 ENCOUNTER — Inpatient Hospital Stay: Payer: 59

## 2020-08-05 ENCOUNTER — Encounter: Payer: Self-pay | Admitting: Cardiovascular Disease

## 2020-08-05 ENCOUNTER — Ambulatory Visit
Admission: RE | Admit: 2020-08-05 | Discharge: 2020-08-05 | Disposition: A | Discharge status: Home / Self Care | Payer: 59 | Attending: Cardiovascular Disease | Admitting: Cardiovascular Disease

## 2020-08-05 ENCOUNTER — Encounter
Admission: RE | Disposition: A | Discharge status: Home / Self Care | Payer: Self-pay | Source: Home / Self Care | Attending: Cardiovascular Disease

## 2020-08-05 DIAGNOSIS — I4819 Other persistent atrial fibrillation: Secondary | ICD-10-CM | POA: Diagnosis not present

## 2020-08-05 DIAGNOSIS — I130 Hypertensive heart and chronic kidney disease with heart failure and stage 1 through stage 4 chronic kidney disease, or unspecified chronic kidney disease: Secondary | ICD-10-CM | POA: Diagnosis not present

## 2020-08-05 DIAGNOSIS — N179 Acute kidney failure, unspecified: Secondary | ICD-10-CM | POA: Diagnosis not present

## 2020-08-05 DIAGNOSIS — J45909 Unspecified asthma, uncomplicated: Secondary | ICD-10-CM | POA: Insufficient documentation

## 2020-08-05 DIAGNOSIS — Z7951 Long term (current) use of inhaled steroids: Secondary | ICD-10-CM | POA: Diagnosis not present

## 2020-08-05 DIAGNOSIS — Z79899 Other long term (current) drug therapy: Secondary | ICD-10-CM | POA: Insufficient documentation

## 2020-08-05 DIAGNOSIS — Z6835 Body mass index (BMI) 35.0-35.9, adult: Secondary | ICD-10-CM | POA: Diagnosis not present

## 2020-08-05 DIAGNOSIS — I5022 Chronic systolic (congestive) heart failure: Secondary | ICD-10-CM | POA: Insufficient documentation

## 2020-08-05 DIAGNOSIS — E1122 Type 2 diabetes mellitus with diabetic chronic kidney disease: Secondary | ICD-10-CM | POA: Diagnosis not present

## 2020-08-05 DIAGNOSIS — Z905 Acquired absence of kidney: Secondary | ICD-10-CM | POA: Diagnosis not present

## 2020-08-05 DIAGNOSIS — E785 Hyperlipidemia, unspecified: Secondary | ICD-10-CM | POA: Insufficient documentation

## 2020-08-05 DIAGNOSIS — Z7901 Long term (current) use of anticoagulants: Secondary | ICD-10-CM | POA: Insufficient documentation

## 2020-08-05 DIAGNOSIS — I420 Dilated cardiomyopathy: Secondary | ICD-10-CM | POA: Diagnosis not present

## 2020-08-05 DIAGNOSIS — N183 Chronic kidney disease, stage 3 unspecified: Secondary | ICD-10-CM | POA: Insufficient documentation

## 2020-08-05 DIAGNOSIS — I4891 Unspecified atrial fibrillation: Secondary | ICD-10-CM | POA: Diagnosis not present

## 2020-08-05 DIAGNOSIS — Z87891 Personal history of nicotine dependence: Secondary | ICD-10-CM | POA: Insufficient documentation

## 2020-08-05 DIAGNOSIS — D751 Secondary polycythemia: Secondary | ICD-10-CM | POA: Insufficient documentation

## 2020-08-05 DIAGNOSIS — Z8249 Family history of ischemic heart disease and other diseases of the circulatory system: Secondary | ICD-10-CM | POA: Insufficient documentation

## 2020-08-05 HISTORY — PX: CARDIOVERSION: SHX1299

## 2020-08-05 LAB — GLUCOSE, CAPILLARY: Glucose-Capillary: 126 mg/dL — ABNORMAL HIGH (ref 70–99)

## 2020-08-05 SURGERY — CARDIOVERSION
Anesthesia: General

## 2020-08-05 MED ORDER — SODIUM CHLORIDE 0.9 % IV SOLN: INTRAVENOUS | Status: DC | PRN

## 2020-08-05 MED ORDER — PROPOFOL 10 MG/ML IV BOLUS
INTRAVENOUS | Status: AC
  Filled 2020-08-05: qty 20

## 2020-08-05 MED ORDER — PROPOFOL 10 MG/ML IV BOLUS
INTRAVENOUS | Status: DC | PRN
  Administered 2020-08-05 (×2): 20 mg via INTRAVENOUS
  Administered 2020-08-05 (×2): 10 mg via INTRAVENOUS
  Administered 2020-08-05: 30 mg via INTRAVENOUS

## 2020-08-05 NOTE — Anesthesia Postprocedure Evaluation (Signed)
Anesthesia Post Note  Patient: Tracy Gardner  Procedure(s) Performed: CARDIOVERSION (N/A )  Patient location during evaluation: Specials Recovery Anesthesia Type: General Level of consciousness: awake and alert Pain management: pain level controlled Vital Signs Assessment: post-procedure vital signs reviewed and stable Respiratory status: spontaneous breathing, nonlabored ventilation, respiratory function stable and patient connected to nasal cannula oxygen Cardiovascular status: blood pressure returned to baseline and stable Postop Assessment: no apparent nausea or vomiting Anesthetic complications: no   No complications documented.   Last Vitals:  Vitals:   08/05/20 0755 08/05/20 0800  BP: 98/63 90/67  Pulse: (!) 45 (!) 45  Resp: 16 19  Temp:    SpO2: 98% 97%    Last Pain:  Vitals:   08/05/20 0815  TempSrc:   PainSc: 0-No pain                 Martha Clan
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
## 2020-08-05 NOTE — Transfer of Care (Signed)
Immediate Anesthesia Transfer of Care Note  Patient: Tracy Gardner  Procedure(s) Performed: CARDIOVERSION (N/A )  Patient Location: PACU  Anesthesia Type:General  Level of Consciousness: sedated and drowsy  Airway & Oxygen Therapy: Patient Spontanous Breathing and Patient connected to nasal cannula oxygen  Post-op Assessment: Report given to RN and Post -op Vital signs reviewed and stable  Post vital signs: Reviewed and stable  Last Vitals:  Vitals Value Taken Time  BP 93/60 08/05/20 0748  Temp    Pulse 44 08/05/20 0749  Resp 17 08/05/20 0749  SpO2 98 % 08/05/20 0749  Vitals shown include unvalidated device data.  Last Pain:  Vitals:   08/05/20 0654  TempSrc: Oral         Complications: No complications documented.

## 2020-08-05 NOTE — Anesthesia Procedure Notes (Signed)
Date/Time: 08/05/2020 7:32 AM Performed by: Johnna Acosta, CRNA Pre-anesthesia Checklist: Patient identified, Emergency Drugs available, Suction available, Patient being monitored and Timeout performed Patient Re-evaluated:Patient Re-evaluated prior to induction Oxygen Delivery Method: Nasal cannula Preoxygenation: Pre-oxygenation with 100% oxygen Induction Type: IV induction

## 2020-08-05 NOTE — CV Procedure (Signed)
Cardioversion procedure note For atrial fibrillation, persistent  Procedure Details:  Consent: Risks of procedure as well as the alternatives and risks of each were explained to the (patient/caregiver).  Consent for procedure obtained.  Time Out: Verified patient identification, verified procedure, site/side was marked, verified correct patient position, special equipment/implants available, medications/allergies/relevent history reviewed, required imaging and test results available.  Performed  Patient placed on cardiac monitor, pulse oximetry, supplemental oxygen as necessary.   Sedation given: propofol IV, Dr. Karenz Pacer pads placed anterior and posterior chest.   Cardioverted 1 time(s).   Cardioverted at  150 J. Synchronized biphasic Converted to NSR   Evaluation: Findings: Post procedure EKG shows: NSR Complications: None Patient did tolerate procedure well.  Time Spent Directly with the Patient:  45 minutes   Tim Jakerra Floyd, M.D., Ph.D.  

## 2020-08-08 NOTE — H&P (Signed)
H&P Addendum, pre-cardioversion  Patient was seen and evaluated prior to -cardioversion procedure Symptoms, prior testing details again confirmed with the patient Patient examined, no significant change from prior exam Lab work reviewed in detail personally by myself Patient understands risk and benefit of the procedure, willing to proceed  Signed, Tim Darlean Warmoth, MD, Ph.D [facility]  

## 2020-08-08 NOTE — Interval H&P Note (Signed)
History and Physical Interval Note:  08/08/2020 12:16 PM  Tracy Gardner  has presented today for surgery, with the diagnosis of Cardioversion  Afib.  The various methods of treatment have been discussed with the patient and family. After consideration of risks, benefits and other options for treatment, the patient has consented to  Procedure(s): CARDIOVERSION (N/A) as a surgical intervention.  The patient's history has been reviewed, patient examined, no change in status, stable for surgery.  I have reviewed the patient's chart and labs.  Questions were answered to the patient's satisfaction.     [name]

## 2020-08-11 DIAGNOSIS — E119 Type 2 diabetes mellitus without complications: Secondary | ICD-10-CM | POA: Diagnosis not present

## 2020-08-11 DIAGNOSIS — I10 Essential (primary) hypertension: Secondary | ICD-10-CM | POA: Diagnosis not present

## 2020-08-11 DIAGNOSIS — E785 Hyperlipidemia, unspecified: Secondary | ICD-10-CM | POA: Diagnosis not present

## 2020-08-11 NOTE — Progress Notes (Signed)
Cardiology Office Note  Date:  08/12/2020   ID:  Tracy Gardner, DOB [birth year], MRN [id number]  PCP:  [name], PA-C   Chief Complaint  Patient presents with  . Follow up Cardioversion     Patient c/o dizziness and fatigue. Medications reviewed by the patient verbally.     HPI:  Tracy Gardner is a 55 year old gentleman with past medical history of  smoking,  diabetes type 2  polycythemia,  hypertension,  hyperlipidemia,  borderline diabetes  presenting to the hospital with with acute respiratory distress, atrial fibrillation with RVR   Underwent TEE cardioversion July 07, 2020, presents for routine follow-up of his atrial fibrillation  Admitted with bronchopneumonia April 2022, started on antibiotics, nebulizers History of smoking Atrial fibrillation with RVR Cardiomyopathy, ejection fraction 35 to 40% , felt secondary to cardiac arrhythmia though unable to exclude ischemia  Echocardiogram July 07, 2020 prior to cardioversion 1. Left ventricular ejection fraction, by estimation, is 35 to 40%. The  left ventricle has moderately decreased function. The left ventricle  demonstrates global hypokinesis. The left ventricular internal cavity size  was mildly dilated. Left ventricular  diastolic parameters are indeterminate.  2. Right ventricular systolic function is normal. The right ventricular  size is normal. There is normal pulmonary artery systolic pressure.  3. Left atrial size was mildly dilated.   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, drives a truck Reports having terrible diarrhea sometimes up to 3 times a day,  Recently seen by primary care,  they gave him a prescription for Jardiance, Metformin stopped secondary to diarrhea, bisoprolol down to 2.5 twice daily  Diabetes type 2  A1c of 7   EKG personally  reviewed by myself on todays visit Sinus bradycardia rate 38 bpm no significant ST-T wave changes orthostatics negative pressures ranging from 44-034 systolic heart rates 74Q to 60s  PMH:   has a past medical history of Asthma, persistent not controlled, Cardiomyopathy ([place]), CKD (chronic kidney disease), stage III ([place]), Essential hypertension, History of nephrectomy, unilateral, Hyperlipidemia, Morbid obesity ([place]), Persistent atrial fibrillation ([place]), Polycythemia, Tobacco abuse, and Type II diabetes mellitus ([place]).  PSH:    Past Surgical History:  Procedure Laterality Date  . CARDIOVERSION N/A 07/08/2020   Procedure: CARDIOVERSION;  Surgeon: Minna Merritts, MD;  Location: [facility] ORS;  Service: Cardiovascular;  Laterality: N/A;  . CARDIOVERSION N/A 08/05/2020   Procedure: CARDIOVERSION;  Surgeon: Minna Merritts, MD;  Location: [facility] ORS;  Service: Cardiovascular;  Laterality: N/A;  . NEPHRECTOMY Right   . TEE WITHOUT CARDIOVERSION N/A 07/08/2020   Procedure: TRANSESOPHAGEAL ECHOCARDIOGRAM (TEE);  Surgeon: Minna Merritts, MD;  Location: [facility] ORS;  Service: Cardiovascular;  Laterality: N/A;    Current Outpatient Medications  Medication Sig Dispense Refill  . albuterol (VENTOLIN HFA) 108 (90 Base) MCG/ACT inhaler Inhale 2 puffs into the lungs every 6 (six) hours as needed for wheezing or shortness of breath.    Marland Kitchen amiodarone (PACERONE) 200 MG tablet Take 200 mg by mouth 2 (two) times daily.    Marland Kitchen apixaban (ELIQUIS) 5 MG TABS tablet Take 1 tablet (5 mg total) by mouth 2 (two) times daily. 60 tablet 1  . bisoprolol (ZEBETA) 5 MG tablet Take 2.5 mg by mouth in the morning and at bedtime.    . cetirizine (ZYRTEC) 10 MG tablet Take 10 mg by mouth daily.    Marland Kitchen  diphenhydrAMINE (SOMINEX) 25 MG tablet Take 50 mg by mouth at bedtime.    . empagliflozin (JARDIANCE) 10 MG TABS tablet Take 1 tablet by mouth daily.    . furosemide (LASIX) 40 MG tablet Take 20 mg by mouth daily.    . nicotine (NICODERM  CQ - DOSED IN MG/24 HOURS) 14 mg/24hr patch Place 1 patch (14 mg total) onto the skin daily. 28 patch 0  . rosuvastatin (CRESTOR) 10 MG tablet Take 1 tablet (10 mg total) by mouth daily. 30 tablet 1   No current facility-administered medications for this visit.     Allergies:   Patient has no known allergies.   Social History:  The patient  reports that he quit smoking about 7 weeks ago. His smoking use included cigarettes. He has a 40.00 pack-year smoking history. He has never used smokeless tobacco. He reports previous alcohol use. He reports that he does not use drugs.   Family History:   family history includes Atrial fibrillation in his mother; CAD in his mother; Diabetes in his father.    Review of Systems: Review of Systems  Constitutional: Positive for malaise/fatigue.  HENT: Negative.   Respiratory: Negative.   Cardiovascular: Negative.   Gastrointestinal: Negative.   Musculoskeletal: Negative.   Neurological: Positive for dizziness.  Psychiatric/Behavioral: Negative.   All other systems reviewed and are negative.   PHYSICAL EXAM: VS:  BP (!) 78/60 (BP Location: Right Arm, Patient Position: Sitting, Cuff Size: Large)   Pulse (!) 38   Ht 5\' 11"  (1.803 m)   Wt 261 lb 8 oz (118.6 kg)   SpO2 95%   BMI 36.47 kg/m  , BMI Body mass index is 36.47 kg/m. GEN: Well nourished, well developed, in no acute distress, obese HEENT: normal Neck: no JVD, carotid bruits, or masses Cardiac: Regular, bradycardic,; no murmurs, rubs, or gallops,no edema  Respiratory:  clear to auscultation bilaterally, normal work of breathing GI: soft, nontender, nondistended, + BS MS: no deformity or atrophy Skin: warm and dry, no rash Neuro:  Strength and sensation are intact Psych: euthymic mood, full affect   Recent Labs: 07/07/2020: ALT 19; B Natriuretic Peptide 240.2; TSH 3.185 07/08/2020: Magnesium 1.7 07/22/2020: BUN 18; Creatinine, Ser 1.47; Hemoglobin 16.5; Platelets 184; Potassium 4.6;  Sodium 142    Lipid Panel Lab Results  Component Value Date   CHOL 174 07/07/2020   HDL 30 (L) 07/07/2020   LDLCALC 119 (H) 07/07/2020   TRIG 127 07/07/2020      Wt Readings from Last 3 Encounters:  08/12/20 261 lb 8 oz (118.6 kg)  08/05/20 252 lb (114.3 kg)  07/27/20 256 lb 6.4 oz (116.3 kg)       ASSESSMENT AND PLAN:  Problem List Items Addressed This Visit      Cardiology Problems   Paroxysmal atrial fibrillation (HCC) - Primary   Relevant Medications   amiodarone (PACERONE) 200 MG tablet   furosemide (LASIX) 40 MG tablet   bisoprolol (ZEBETA) 5 MG tablet   Dilated cardiomyopathy (HCC)   Relevant Medications   amiodarone (PACERONE) 200 MG tablet   furosemide (LASIX) 40 MG tablet   bisoprolol (ZEBETA) 5 MG tablet     Other   Stage 3a chronic kidney disease (HCC)   Diabetes mellitus type 2 with complications (HCC)   Relevant Medications   empagliflozin (JARDIANCE) 10 MG TABS tablet     Paroxysmal atrial fibrillation Bradycardic today following recent TEE cardioversion Recommend he decrease amiodarone down to 200 mg daily but hold  for rate less than 50 Bisoprolol down to 2.5 daily Stay on anticoagulation, Eliquis 5 twice daily  Hypotension Will decrease bisoprolol as above down to 2.5 daily from twice daily Has had terrible diarrhea, yesterday with 3 bowel movements Metformin recently held, BMP today to check potassium and renal function in the setting of diarrhea on diuretic and low blood pressure  Diabetes type 2 Metformin held by primary care He was started on Jardiance but has not started BMP today, may need to cut back on his diuretic with initiation of Jardiance  Smoker Weight actually trending higher as he is eating more per the wife as he struggles with smoking cessation  Cardiomyopathy On low-dose beta-blocker, Every effort to maintain normal sinus rhythm Agree with Jardiance once his diarrhea resolves Could try to add low-dose ACE or ARB  if blood pressure stabilizes, wife will call back with blood pressure measurements -Repeat echocardiogram 1 month in normal sinus rhythm    Total encounter time more than 35 minutes  Greater than 50% was spent in counseling and coordination of care with the patient    Signed, Esmond Plants, M.D., Ph.D. [place], [place]

## 2020-08-12 ENCOUNTER — Encounter: Payer: Self-pay | Admitting: Cardiovascular Disease

## 2020-08-12 ENCOUNTER — Other Ambulatory Visit
Admission: RE | Admit: 2020-08-12 | Discharge: 2020-08-12 | Disposition: A | Discharge status: Home / Self Care | Payer: 59 | Source: Ambulatory Visit | Attending: Cardiovascular Disease | Admitting: Cardiovascular Disease

## 2020-08-12 ENCOUNTER — Ambulatory Visit: Payer: 59 | Admitting: Cardiovascular Disease

## 2020-08-12 ENCOUNTER — Other Ambulatory Visit: Payer: Self-pay

## 2020-08-12 VITALS — BP 78/60 | HR 38 | Ht 71.0 in | Wt 261.5 lb

## 2020-08-12 DIAGNOSIS — N1831 Chronic kidney disease, stage 3a: Secondary | ICD-10-CM | POA: Insufficient documentation

## 2020-08-12 DIAGNOSIS — I480 Paroxysmal atrial fibrillation: Secondary | ICD-10-CM | POA: Insufficient documentation

## 2020-08-12 DIAGNOSIS — I420 Dilated cardiomyopathy: Secondary | ICD-10-CM | POA: Diagnosis not present

## 2020-08-12 DIAGNOSIS — E118 Type 2 diabetes mellitus with unspecified complications: Secondary | ICD-10-CM

## 2020-08-12 LAB — BASIC METABOLIC PANEL
Anion gap: 9 (ref 5–15)
BUN: 27 mg/dL — ABNORMAL HIGH (ref 6–20)
CO2: 28 mmol/L (ref 22–32)
Calcium: 9.1 mg/dL (ref 8.9–10.3)
Chloride: 103 mmol/L (ref 98–111)
Creatinine, Ser: 1.37 mg/dL — ABNORMAL HIGH (ref 0.61–1.24)
GFR, Estimated: >60 mL/min (ref >60)
Glucose, Bld: 81 mg/dL (ref 70–99)
Potassium: 4.3 mmol/L (ref 3.5–5.1)
Sodium: 140 mmol/L (ref 135–145)

## 2020-08-12 NOTE — Patient Instructions (Addendum)
Medication Instructions:  Decrease the amiodarone   down to 200 mg daily,   hold for pulse <50 (under 50 BMP) Decrease the   bisoprolol down to 2.5 mg daily  Stay on eliquis  If you need a refill on your cardiac medications before your next appointment, please call your pharmacy.    Lab work: [employer]  Walk into medical mall at the check in desk, they will direct you to lab registration, hours for labs are Monday-Friday 07:00am-5:30pm (no appointment necessary)   [place], ABNORMAL RESULTS WILL BE CALLED  Testing/Procedures: Echo (atrial fib, cardiomyopathy)  In one month (July)  Your physician has requested that you have an echocardiogram. Echocardiography is a painless test that uses sound waves to create images of your heart. It provides your doctor with information about the size and shape of your heart and how well your heart's chambers and valves are working. This procedure takes approximately one hour. There are no restrictions for this procedure.  There is a possibility that an IV may need to be started during your test to inject an image enhancing agent. This is done to obtain more optimal pictures of your heart. Therefore we ask that you do at least drink some water prior to coming in to hydrate your veins.    Follow-Up: At [facility], you and your health needs are our priority.  As part of our continuing mission to provide you with exceptional heart care, we have created designated Provider Care Teams.  These Care Teams include your primary Cardiologist (physician) and Advanced Practice Providers (APPs -  Physician Assistants and Nurse Practitioners) who all work together to provide you with the care you need, when you need it.  . You will need a follow up appointment in 1 month after echo  . Providers on your designated Care Team:   . Murray Hodgkins, NP . Christell Faith, PA-C . Marrianne Mood, PA-C  Any Other Special Instructions Will Be Listed  Below (If Applicable).  COVID-19 Vaccine Information can be found at: ShippingScam.co.uk For questions related to vaccine distribution or appointments, please email [email or web address] or call [phone].

## 2020-08-23 ENCOUNTER — Telehealth: Payer: Self-pay | Admitting: Primary Care

## 2020-08-23 NOTE — Telephone Encounter (Signed)
Patient is scheduled for OV on 08/24/2020 to review sleep study results.  It does not appear that patient had sleep study.  Lm for both patient and Lovena Le with [facility] to confirm.

## 2020-08-23 NOTE — Telephone Encounter (Signed)
Spoke to patient, who stated that he is not scheduled for sleep study yet. Patient will call back to scheduled phone visit once scheduled for sleep study.  08/24/2020 visit canceled.

## 2020-08-23 NOTE — Telephone Encounter (Signed)
Patient stated he was unable to reach [facility]. He unsure when his sleep study is scheduled for.   Lm for taylor with [facility].

## 2020-08-24 ENCOUNTER — Ambulatory Visit: Payer: 59 | Admitting: Primary Care

## 2020-08-24 NOTE — Telephone Encounter (Signed)
Lm for [employer]. Will call once more due to nature of call.

## 2020-08-25 ENCOUNTER — Ambulatory Visit: Payer: 59 | Attending: Pulmonary Disease

## 2020-08-25 DIAGNOSIS — I5022 Chronic systolic (congestive) heart failure: Secondary | ICD-10-CM | POA: Insufficient documentation

## 2020-08-25 DIAGNOSIS — G4733 Obstructive sleep apnea (adult) (pediatric): Secondary | ICD-10-CM | POA: Diagnosis not present

## 2020-08-25 DIAGNOSIS — R0683 Snoring: Secondary | ICD-10-CM | POA: Diagnosis present

## 2020-08-26 ENCOUNTER — Other Ambulatory Visit: Payer: Self-pay

## 2020-08-26 NOTE — Telephone Encounter (Signed)
Spoke to Urania with [facility], who stated that patient had sleep study on 08/25/2020.

## 2020-08-26 NOTE — Telephone Encounter (Signed)
ATC patient--no answer with no option to leave vm. Line rang >60min.

## 2020-08-29 ENCOUNTER — Telehealth: Payer: Self-pay | Admitting: Primary Care

## 2020-08-29 NOTE — Telephone Encounter (Signed)
Please refer to 08/23/2020 phone note.   Patient had sleep study on 08/25/2020. He is aware that we will contact him with results.  Nothing further needed at this time.

## 2020-09-06 ENCOUNTER — Other Ambulatory Visit: Payer: Self-pay | Admitting: Obstetrics and Gynecology

## 2020-09-09 ENCOUNTER — Other Ambulatory Visit: Payer: Self-pay

## 2020-09-09 ENCOUNTER — Encounter: Payer: Self-pay | Admitting: Internal Medicine

## 2020-09-09 MED ORDER — JARDIANCE 10 MG PO TABS
10.0000 mg | ORAL_TABLET | Freq: Every day | ORAL | 11 refills | Status: DC
  Filled 2020-09-09: qty 30, 30d supply, fill #0
  Filled 2020-10-12: qty 30, 30d supply, fill #1
  Filled 2020-11-15: qty 30, 30d supply, fill #2
  Filled 2020-12-16: qty 30, 30d supply, fill #3
  Filled 2021-01-12: qty 30, 30d supply, fill #4
  Filled 2021-02-16: qty 30, 30d supply, fill #5
  Filled 2021-04-17: qty 30, 30d supply, fill #6
  Filled 2021-05-22: qty 30, 30d supply, fill #7
  Filled 2021-06-21 – 2021-07-06 (×2): qty 30, 30d supply, fill #8
  Filled 2021-08-03: qty 30, 30d supply, fill #9

## 2020-09-09 MED ORDER — FLUTICASONE PROPIONATE HFA 110 MCG/ACT IN AERO
INHALATION_SPRAY | Freq: Two times a day (BID) | RESPIRATORY_TRACT | 12 refills | Status: DC
  Filled 2020-09-09: qty 12, 30d supply, fill #0

## 2020-09-09 MED ORDER — ROSUVASTATIN CALCIUM 10 MG PO TABS
10.0000 mg | ORAL_TABLET | Freq: Every day | ORAL | 1 refills | Status: DC
  Filled 2020-09-09: qty 30, 30d supply, fill #0

## 2020-09-09 MED ORDER — APIXABAN 5 MG PO TABS
ORAL_TABLET | ORAL | 0 refills | Status: DC
  Filled 2020-09-09: qty 28, 14d supply, fill #0

## 2020-09-14 NOTE — Progress Notes (Signed)
Cardiology Office Note  Date:  09/16/2020   ID:  Tracy Gardner, DOB 1965-11-24, MRN [id number]  PCP:  [name], PA-C   Chief Complaint  Patient presents with   Follow-up    Discuss Echo results. Medications reviewed by the patient verbally. "Doing well."    HPI:  Mr. Tracy Gardner is a 55 year old gentleman with past medical history of  smoking,  diabetes type 2  polycythemia,  hypertension,  hyperlipidemia,  borderline diabetes  presenting to the hospital with with acute respiratory distress, atrial fibrillation with RVR   Underwent TEE cardioversion July 07, 2020, presents for routine follow-up of his atrial fibrillation  On his last clinic visit pressure was low, heart rate low, bisoprolol cut down to 2.5 daily amiodarone down to 200 daily  In follow-up today wife reports she is cut the medications down further She is giving him Lasix 10 daily, bisoprolol 1.25 daily  Reports feeling well, going camping this weekend  with the kids No significant leg swelling Weight continues to run high She reports that she cannot rely on him to check his weights or take his medications, she has to do it for him  He reports no significant shortness of breath no chest pain No tachypalpitations  EKG personally reviewed by myself on todays visit Normal sinus rhythm rate 61 bpm no significant ST-T wave changes  Of past medical history reviewed Admitted with bronchopneumonia April 2022, started on antibiotics, nebulizers History of smoking Atrial fibrillation with RVR Cardiomyopathy, ejection fraction 35 to 40% , felt secondary to cardiac arrhythmia though unable to exclude ischemia  Echocardiogram July 07, 2020 prior to cardioversion  1. Left ventricular ejection fraction, by estimation, is 35 to 40%. The  left ventricle has moderately decreased function. The left ventricle  demonstrates global hypokinesis. The left ventricular internal cavity size  was mildly dilated. Left  ventricular  diastolic parameters are indeterminate.   2. Right ventricular systolic function is normal. The right ventricular  size is normal. There is normal pulmonary artery systolic pressure.   3. Left atrial size was mildly dilated.   Underwent TEE cardioversion July 07, 2020  A1c of 7     PMH:   has a past medical history of Asthma, persistent not controlled, Cardiomyopathy ([place]), CKD (chronic kidney disease), stage III ([place]), Essential hypertension, History of nephrectomy, unilateral, Hyperlipidemia, Morbid obesity ([place]), Persistent atrial fibrillation ([place]), Polycythemia, Tobacco abuse, and Type II diabetes mellitus ([place]).  PSH:    Past Surgical History:  Procedure Laterality Date   CARDIOVERSION N/A 07/08/2020   Procedure: CARDIOVERSION;  Surgeon: Minna Merritts, MD;  Location: [facility] ORS;  Service: Cardiovascular;  Laterality: N/A;   CARDIOVERSION N/A 08/05/2020   Procedure: CARDIOVERSION;  Surgeon: Minna Merritts, MD;  Location: [facility] ORS;  Service: Cardiovascular;  Laterality: N/A;   NEPHRECTOMY Right    TEE WITHOUT CARDIOVERSION N/A 07/08/2020   Procedure: TRANSESOPHAGEAL ECHOCARDIOGRAM (TEE);  Surgeon: Minna Merritts, MD;  Location: [facility] ORS;  Service: Cardiovascular;  Laterality: N/A;    Current Outpatient Medications  Medication Sig Dispense Refill   albuterol (VENTOLIN HFA) 108 (90 Base) MCG/ACT inhaler Inhale 2 puffs into the lungs every 6 (six) hours as needed for wheezing or shortness of breath.     amiodarone (PACERONE) 200 MG tablet Take 200 mg by mouth daily. Hold for HR less then 50     apixaban (ELIQUIS) 5 MG TABS tablet Take 1 tablet (5 mg total) by mouth 2 (two) times daily. 60 tablet  1   bisoprolol (ZEBETA) 5 MG tablet Take 2.5 mg by mouth daily.     cetirizine (ZYRTEC) 10 MG tablet Take 10 mg by mouth daily.     diphenhydrAMINE (SOMINEX) 25 MG tablet Take 50 mg by mouth at bedtime.     empagliflozin (JARDIANCE) 10 MG TABS tablet Take 1 tablet (10 mg  total) by mouth daily. 30 tablet 11   fluticasone (FLOVENT HFA) 110 MCG/ACT inhaler Inhale into the lungs 2 (two) times daily. 12 g 12   furosemide (LASIX) 40 MG tablet Take 20 mg by mouth daily.     rosuvastatin (CRESTOR) 10 MG tablet Take 1 tablet (10 mg total) by mouth daily. 30 tablet 1   nicotine (NICODERM CQ - DOSED IN MG/24 HOURS) 14 mg/24hr patch Place 1 patch (14 mg total) onto the skin daily. (Patient not taking: Reported on 09/16/2020) 28 patch 0   No current facility-administered medications for this visit.     Allergies:   Patient has no known allergies.   Social History:  The patient  reports that he quit smoking about 2 months ago. His smoking use included cigarettes. He has a 40.00 pack-year smoking history. He has never used smokeless tobacco. He reports previous alcohol use. He reports that he does not use drugs.   Family History:   family history includes Atrial fibrillation in his mother; CAD in his mother; Diabetes in his father.    Review of Systems: Review of Systems  Constitutional: Negative.   HENT: Negative.    Respiratory: Negative.    Cardiovascular: Negative.   Gastrointestinal: Negative.   Musculoskeletal: Negative.   Neurological: Negative.   Psychiatric/Behavioral: Negative.    All other systems reviewed and are negative.  PHYSICAL EXAM: VS:  BP (!) 100/50 (BP Location: Left Arm, Patient Position: Sitting, Cuff Size: Large)   Pulse 60   Ht 5\' 11"  (1.803 m)   Wt 261 lb 8 oz (118.6 kg)   SpO2 96%   BMI 36.47 kg/m  , BMI Body mass index is 36.47 kg/m. Constitutional:  oriented to person, place, and time. No distress.  Obese HENT:  Head: Grossly normal Eyes:  no discharge. No scleral icterus.  Neck: No JVD, no carotid bruits  Cardiovascular: Regular rate and rhythm, no murmurs appreciated Pulmonary/Chest: Clear to auscultation bilaterally, no wheezes or rails Abdominal: Soft.  no distension.  no tenderness.  Musculoskeletal: Normal range of  motion Neurological:  normal muscle tone. Coordination normal. No atrophy Skin: Skin warm and dry Psychiatric: normal affect, pleasant   Recent Labs: 07/07/2020: ALT 19; B Natriuretic Peptide 240.2; TSH 3.185 07/08/2020: Magnesium 1.7 07/22/2020: Hemoglobin 16.5; Platelets 184 08/12/2020: BUN 27; Creatinine, Ser 1.37; Potassium 4.3; Sodium 140    Lipid Panel Lab Results  Component Value Date   CHOL 174 07/07/2020   HDL 30 (L) 07/07/2020   LDLCALC 119 (H) 07/07/2020   TRIG 127 07/07/2020      Wt Readings from Last 3 Encounters:  09/16/20 261 lb 8 oz (118.6 kg)  08/12/20 261 lb 8 oz (118.6 kg)  08/05/20 252 lb (114.3 kg)       ASSESSMENT AND PLAN:  Problem List Items Addressed This Visit       Cardiology Problems   Paroxysmal atrial fibrillation ([place]) - Primary   Essential hypertension   Dilated cardiomyopathy ([place])     Other   Diabetes mellitus type 2 with complications ([place])   Other Visit Diagnoses     Hyperlipidemia, mixed  Paroxysmal atrial fibrillation Wife has been adjusting his medications took bisoprolol down to 1.25 daily, Lasix 10 Recommend to stay on low-dose amiodarone 200 daily Stay on anticoagulation, Eliquis 5 twice daily  Diabetes type 2 Metformin held by primary care Taking Jardiance  Smoker We have encouraged him to continue to work on weaning his cigarettes and smoking cessation. He will continue to work on this and does not want any assistance with chantix.    Cardiomyopathy On low-dose beta-blocker, Jardiance Wife still low, will not add ACE or ARB Ejection fraction back to normal and normal sinus rhythm    Total encounter time more than 25 minutes  Greater than 50% was spent in counseling and coordination of care with the patient    Signed, Esmond Plants, M.D., Ph.D. [place], [place]

## 2020-09-15 ENCOUNTER — Ambulatory Visit (INDEPENDENT_AMBULATORY_CARE_PROVIDER_SITE_OTHER): Payer: 59

## 2020-09-15 ENCOUNTER — Other Ambulatory Visit: Payer: Self-pay

## 2020-09-15 ENCOUNTER — Telehealth (INDEPENDENT_AMBULATORY_CARE_PROVIDER_SITE_OTHER): Payer: 59 | Admitting: Primary Care

## 2020-09-15 DIAGNOSIS — I480 Paroxysmal atrial fibrillation: Secondary | ICD-10-CM

## 2020-09-15 DIAGNOSIS — G4733 Obstructive sleep apnea (adult) (pediatric): Secondary | ICD-10-CM

## 2020-09-15 DIAGNOSIS — I420 Dilated cardiomyopathy: Secondary | ICD-10-CM | POA: Diagnosis not present

## 2020-09-15 LAB — ECHOCARDIOGRAM LIMITED
Area-P 1/2: 3.33 cm2
Calc EF: 53.2 %
S' Lateral: 3.7 cm
Single Plane A2C EF: 52.4 %
Single Plane A4C EF: 51.8 %

## 2020-09-15 NOTE — Telephone Encounter (Signed)
Spoke to patient, who is requesting HST results.   Beth, please advise. Thanks

## 2020-09-16 ENCOUNTER — Encounter: Payer: Self-pay | Admitting: Cardiovascular Disease

## 2020-09-16 ENCOUNTER — Ambulatory Visit: Payer: 59 | Admitting: Cardiovascular Disease

## 2020-09-16 ENCOUNTER — Other Ambulatory Visit: Payer: Self-pay

## 2020-09-16 VITALS — BP 100/50 | HR 60 | Ht 71.0 in | Wt 261.5 lb

## 2020-09-16 DIAGNOSIS — E118 Type 2 diabetes mellitus with unspecified complications: Secondary | ICD-10-CM | POA: Diagnosis not present

## 2020-09-16 DIAGNOSIS — I480 Paroxysmal atrial fibrillation: Secondary | ICD-10-CM | POA: Diagnosis not present

## 2020-09-16 DIAGNOSIS — I10 Essential (primary) hypertension: Secondary | ICD-10-CM | POA: Diagnosis not present

## 2020-09-16 DIAGNOSIS — E782 Mixed hyperlipidemia: Secondary | ICD-10-CM

## 2020-09-16 DIAGNOSIS — I420 Dilated cardiomyopathy: Secondary | ICD-10-CM

## 2020-09-16 MED ORDER — EMPAGLIFLOZIN 10 MG PO TABS
10.0000 mg | ORAL_TABLET | Freq: Every day | ORAL | 11 refills | Status: DC
  Filled 2020-09-16: qty 30, 30d supply, fill #0

## 2020-09-16 MED ORDER — BISOPROLOL FUMARATE 5 MG PO TABS
2.5000 mg | ORAL_TABLET | Freq: Every day | ORAL | 3 refills | Status: DC
  Filled 2020-09-16 (×2): qty 45, 90d supply, fill #0
  Filled 2021-04-17: qty 45, 90d supply, fill #1

## 2020-09-16 MED ORDER — FUROSEMIDE 20 MG PO TABS
20.0000 mg | ORAL_TABLET | Freq: Every day | ORAL | 3 refills | Status: DC
  Filled 2020-09-16: qty 90, 90d supply, fill #0

## 2020-09-16 MED ORDER — APIXABAN 5 MG PO TABS
5.0000 mg | ORAL_TABLET | Freq: Two times a day (BID) | ORAL | 3 refills | Status: DC
  Filled 2020-09-16: qty 90, 45d supply, fill #0

## 2020-09-16 MED ORDER — AMIODARONE HCL 200 MG PO TABS
200.0000 mg | ORAL_TABLET | Freq: Every day | ORAL | 3 refills | Status: DC
  Filled 2020-09-16: qty 90, 90d supply, fill #0
  Filled 2021-04-17: qty 90, 90d supply, fill #1

## 2020-09-16 MED ORDER — ROSUVASTATIN CALCIUM 10 MG PO TABS
10.0000 mg | ORAL_TABLET | Freq: Every day | ORAL | 11 refills | Status: DC
  Filled 2020-09-16: qty 30, 30d supply, fill #0
  Filled 2020-10-12: qty 90, 90d supply, fill #0
  Filled 2021-01-12: qty 90, 90d supply, fill #1
  Filled 2021-04-17: qty 90, 90d supply, fill #2

## 2020-09-16 NOTE — Telephone Encounter (Signed)
It does not appear that sleep study has been read.  Dr. Elsworth Soho, please advise. Thanks

## 2020-09-16 NOTE — Telephone Encounter (Signed)
I do not think that they have been read, can you double check for me [facility]

## 2020-09-16 NOTE — Telephone Encounter (Signed)
Spoke to Beverly with [facility] who stated that sleep study is in Dr. Bari Mantis queue to be read.

## 2020-09-16 NOTE — Patient Instructions (Addendum)
Medication Instructions:  Lasix 10 mg to 20 mg daily Bisoprolol 1.25 to 2.5 daily  Cardiac meds are refilled today  If you need a refill on your cardiac medications before your next appointment, please call your pharmacy.   Lab work: No new labs needed  Testing/Procedures: No new testing needed   Follow-Up: At [facility], you and your health needs are our priority.  As part of our continuing mission to provide you with exceptional heart care, we have created designated Provider Care Teams.  These Care Teams include your primary Cardiologist (physician) and Advanced Practice Providers (APPs -  Physician Assistants and Nurse Practitioners) who all work together to provide you with the care you need, when you need it.  You will need a follow up appointment in 6 months  Providers on your designated Care Team:   Murray Hodgkins, NP Christell Faith, PA-C Marrianne Mood, PA-C Cadence Selby, [place]  COVID-19 Vaccine Information can be found at: ShippingScam.co.uk For questions related to vaccine distribution or appointments, please email [email or web address] or call [phone].

## 2020-09-19 ENCOUNTER — Other Ambulatory Visit: Payer: Self-pay

## 2020-09-20 NOTE — Telephone Encounter (Signed)
Lm for patient.  

## 2020-09-20 NOTE — Telephone Encounter (Signed)
Severe OSA 60/h Needed CPAP 17 cm  Suggest - AutoCPAP 10-17 med FF mask

## 2020-09-20 NOTE — Telephone Encounter (Signed)
Needs virtual visit to review sleep study

## 2020-09-21 DIAGNOSIS — G4733 Obstructive sleep apnea (adult) (pediatric): Secondary | ICD-10-CM

## 2020-09-21 NOTE — Telephone Encounter (Signed)
That is ok 

## 2020-09-21 NOTE — Telephone Encounter (Addendum)
Mychart visit scheduled for 10/12/2020 at 10:30 as this was first available. Patient is aware and voiced his understanding.    Beth, please advise if this appt is okay or does patient need sooner appt due to high AHI?

## 2020-09-22 ENCOUNTER — Other Ambulatory Visit: Payer: Self-pay | Admitting: Obstetrics and Gynecology

## 2020-09-22 ENCOUNTER — Telehealth: Payer: Self-pay | Admitting: Cardiovascular Disease

## 2020-09-22 ENCOUNTER — Other Ambulatory Visit: Payer: Self-pay

## 2020-09-22 DIAGNOSIS — I480 Paroxysmal atrial fibrillation: Secondary | ICD-10-CM

## 2020-09-22 MED ORDER — APIXABAN 5 MG PO TABS
5.0000 mg | ORAL_TABLET | Freq: Two times a day (BID) | ORAL | 1 refills | Status: DC
  Filled 2020-09-22: qty 180, 90d supply, fill #0
  Filled 2020-12-16 (×2): qty 180, 90d supply, fill #1

## 2020-09-22 MED ORDER — APIXABAN 5 MG PO TABS: 5.0000 mg | ORAL_TABLET | Freq: Two times a day (BID) | ORAL | 1 refills | Status: DC

## 2020-09-22 NOTE — Addendum Note (Signed)
Addended by: Derrel Nip B on: 09/22/2020 05:00 PM   Modules accepted: Orders

## 2020-09-22 NOTE — Telephone Encounter (Signed)
*  STAT* If patient is at the pharmacy, call can be transferred to refill team.   1. Which medications need to be refilled? (please list name of each medication and dose if known) Eliquis 5mg  1 tablet twicw a day  2. Which pharmacy/location (including street and city if local pharmacy) is medication to be sent to? [facility]  3. Do they need a 30 day or 90 day supply? 30 day

## 2020-09-22 NOTE — Telephone Encounter (Addendum)
Eliquis 5mg  refill request received. Patient is 55 years old, weight-118.6kg, Crea-1.37 on 08/12/2020, Diagnosis-Afib, and last seen by Dr. Rockey Situ on 09/16/2020. Dose is appropriate based on dosing criteria. Will send in refill to requested pharmacy.   Called the correct pharmacy since sent to [facility]. Spoke with pharmacist at [facility] and gave verbal for Eliquis 5mg  BID, 180 tabs with 1 refill.  Called [facility] to cancel refill but I would ring then hang up tried twice.

## 2020-09-27 ENCOUNTER — Other Ambulatory Visit (HOSPITAL_COMMUNITY): Payer: Self-pay

## 2020-09-28 ENCOUNTER — Other Ambulatory Visit: Payer: Self-pay | Admitting: *Deleted

## 2020-09-28 DIAGNOSIS — D751 Secondary polycythemia: Secondary | ICD-10-CM

## 2020-09-29 ENCOUNTER — Telehealth: Payer: Self-pay | Admitting: Primary Care

## 2020-09-29 DIAGNOSIS — Z021 Encounter for pre-employment examination: Secondary | ICD-10-CM | POA: Diagnosis not present

## 2020-09-29 NOTE — Telephone Encounter (Signed)
Spoke to patient, who stated that he would like to go ahead and proceed with cpap prior to 10/12/2020 visit. 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 Per patient DOT is also requesting a letter from our office.  Beth, Please advise. Thanks

## 2020-09-29 NOTE — Telephone Encounter (Signed)
Lm for patient.  

## 2020-09-30 ENCOUNTER — Other Ambulatory Visit: Payer: Self-pay

## 2020-09-30 ENCOUNTER — Inpatient Hospital Stay: Payer: 59

## 2020-09-30 ENCOUNTER — Inpatient Hospital Stay: Payer: 59 | Attending: Internal Medicine

## 2020-09-30 DIAGNOSIS — D751 Secondary polycythemia: Secondary | ICD-10-CM | POA: Diagnosis not present

## 2020-09-30 LAB — HEMOGLOBIN AND HEMATOCRIT, BLOOD
HCT: 42.2 % (ref 39.0–52.0)
Hemoglobin: 13.2 g/dL (ref 13.0–17.0)

## 2020-09-30 NOTE — Telephone Encounter (Signed)
Yes I am fine wit this. Go ahead and place order, I believe [facility] has some resmed machines if he is able to drive there to pick up device

## 2020-09-30 NOTE — Telephone Encounter (Signed)
ATC patient--no answer with no option to leave vm. Line rang for >1min.  °

## 2020-09-30 NOTE — Progress Notes (Signed)
Hgb is 13.2 and HCT is 42.2. Parameters state to hold phlebotomy if HCT is less then 48. Informed pt he did not need phlebotomy today.

## 2020-10-03 NOTE — Telephone Encounter (Signed)
ATC patient x2--no answer with no option to leave vm. Line rang for >40mn.  Will close encounter per office protocol.

## 2020-10-06 DIAGNOSIS — I482 Chronic atrial fibrillation, unspecified: Secondary | ICD-10-CM | POA: Diagnosis not present

## 2020-10-06 DIAGNOSIS — E785 Hyperlipidemia, unspecified: Secondary | ICD-10-CM | POA: Diagnosis not present

## 2020-10-06 DIAGNOSIS — I420 Dilated cardiomyopathy: Secondary | ICD-10-CM | POA: Diagnosis not present

## 2020-10-06 DIAGNOSIS — I10 Essential (primary) hypertension: Secondary | ICD-10-CM | POA: Diagnosis not present

## 2020-10-06 DIAGNOSIS — E119 Type 2 diabetes mellitus without complications: Secondary | ICD-10-CM | POA: Diagnosis not present

## 2020-10-12 ENCOUNTER — Other Ambulatory Visit: Payer: Self-pay

## 2020-10-12 ENCOUNTER — Telehealth: Payer: 59 | Admitting: Primary Care

## 2020-10-13 ENCOUNTER — Other Ambulatory Visit: Payer: Self-pay

## 2020-10-13 MED ORDER — JARDIANCE 10 MG PO TABS
10.0000 mg | ORAL_TABLET | Freq: Every day | ORAL | 3 refills | Status: DC
  Filled 2020-10-13: qty 90, 90d supply, fill #0

## 2020-10-14 ENCOUNTER — Telehealth: Payer: Self-pay | Admitting: Primary Care

## 2020-10-14 DIAGNOSIS — G4733 Obstructive sleep apnea (adult) (pediatric): Secondary | ICD-10-CM

## 2020-10-14 NOTE — Telephone Encounter (Signed)
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

## 2020-10-14 NOTE — Telephone Encounter (Signed)
Yes that is fine, I believe they will need to bring machine into [facility] tho. Raquel Sarna does that sounds right?

## 2020-10-17 NOTE — Telephone Encounter (Signed)
Can we place an DME order for them to look at his machine. See if patient's machine can be used as auto PAP, if so I would place an order for 5-15cm. If it can only use set PAP pressure I would do 10cm. And he needs follow-up in 31 days with download.

## 2020-10-17 NOTE — Telephone Encounter (Signed)
Called and spoke with pt's spouse Tracy Gardner letting her know the info stated by BW and she verbalized understanding. Order has been placed. Tracy Gardner said that pt still wanted to keep OV as scheduled tomorrow 8/9 with Beth to further discuss sleep study so appt is still as scheduled. Nothing further needed.

## 2020-10-17 NOTE — Telephone Encounter (Signed)
No order has been placed showing what pt's settings should be if he went to DME with the friend's cpap machine.  DME would need this info so they could see if they could get pt's friend's cpap machine set up for him.

## 2020-10-24 ENCOUNTER — Telehealth (INDEPENDENT_AMBULATORY_CARE_PROVIDER_SITE_OTHER): Payer: 59 | Admitting: Primary Care

## 2020-10-24 DIAGNOSIS — G4733 Obstructive sleep apnea (adult) (pediatric): Secondary | ICD-10-CM | POA: Diagnosis not present

## 2020-10-24 DIAGNOSIS — Z9989 Dependence on other enabling machines and devices: Secondary | ICD-10-CM

## 2020-10-24 NOTE — Patient Instructions (Signed)
  Sleep study showed severe obstructive sleep apnea  Please bring CPAP machine to [facility] and have them register device under your name, need to change setting to 10-17cm h20 and provide you with a new SD card. They will also need to start sending you supplies   [facility] ([facility] [place] [address]  [place] [address] [place]   Follow-up: Sept 29th at [facility] in [place] with Eustaquio Maize NP (please bring SD card with you to visit)

## 2020-10-24 NOTE — Progress Notes (Signed)
Virtual Visit via Telephone Note  I connected with Tracy Gardner on 10/24/20 at 10:30 AM EDT by a video enabled telemedicine application and verified that I am speaking with the correct person using two identifiers.  Location: Patient: Home Provider: Office    I discussed the limitations of evaluation and management by telemedicine and the availability of in person appointments. The patient expressed understanding and agreed to proceed.  History of Present Illness: 55 year old male, former smoker quit in April 2022 (40-pack-year history).  Past medical history significant for hypertension, A. fib, dilated cardiomyopathy, heart failure with reduced ejection fraction, asthma, type 2 diabetes, stage III chronic kidney disease.  Previous [facility] pulmonary encounter: 07/27/2020 Patient presents today for sleep consult, referred by cardiology for suspected OSA.  Accompanied by his wife.  He was admitted from 4/28-5/3 for new onset paroxysmal afib, bronchial pneumonia, acute heart failure. He was treated with IV antibiotics and steriods. Echo showed reduced EF 35-40%. He underwent cardioversion on 4/29 but went back into afib. He was treated with lasix and started on amiodarone and Eliquis. He is scheduled for second cardioversion on May 27th, given his age he is not a good candidate for amiodarone long term. Patient has symptoms of excessive daytime sleepiness, witnessed apnea and snoring. His weight has gone up 50 lbs over the last two years.  Epworth 15/24.   Sleep questionnaire:  Prior sleep study- None Symptoms- Loud snoring, witnessed apnea, restless sleep, daytime sleepiness, shirt collar >17 inches Bedtime- 9-11pm Take it takes to fall asleep- immediately Nocturnal awakenings- 6 Out of bed in morning- 8-9am Weight change- +50lbs in 2 years Epworth- 15/24   10/24/2020- Interim hx  Patient contacted today for virtual visit to discuss home sleep study results.  Split night sleep study in June 2022  showed severe OSA, AHI 60/hr. Order was placed for auto CPAP 10-17cm h20 in June 2022. He has been our of work since April. He works for department of [employer] and can not return to work until he has 30 days compliance with CPAP. He purchased a resmed machine from a friend for $700 dollars. He states his machine is set at 4cm h20. He has been using this for the last several days and already reports improvement in sleep quality and daytime fatigue.    Observations/Objective:  - Able to speak in full sentences without overt shortness of breath   Assessment and Plan: Severe OSA:  - Symptoms of snoring, witnessed apnea. He works for [employer] and has been out of work since April 2022 - 08/25/20 Split night sleep study>> AHI 60.8/hr. Basal SpO2 was 94% with SaO2 of 63%.  - Successful PAP titration recommended CPAP therapy with pressure setting 17cm h20 or auto titrate 10-17cm h20 - He purchased a used ResMed machine, current pressure setting is 4cm h20. He will need to bring machine into DME ([facility]) company to have them register machine and change pressure setting TO 10-17cm h20 (or 17cm h20 if unable to do auto settings) - He needs 30 days of compliance with CPAP to return to work  - Advised patient to continue to wear CPAP every night for 4-6 hours or longer  [facility] ([facility] [place] [address]  [place] [address] [place]    Follow Up Instructions:  -Sept 29th at [facility] in [place] with Eustaquio Maize NP    I discussed the assessment and treatment plan with the patient. The patient was provided an opportunity to  ask questions and all were answered. The patient agreed with the plan and demonstrated an understanding of the instructions.   The patient was advised to call back or seek an in-person evaluation if the symptoms worsen or if the condition fails to improve as anticipated.  I provided 30 minutes of non-face-to-face time during  this encounter.   Martyn Ehrich, NP

## 2020-11-15 ENCOUNTER — Other Ambulatory Visit: Payer: Self-pay

## 2020-11-22 ENCOUNTER — Other Ambulatory Visit: Payer: Self-pay

## 2020-11-22 DIAGNOSIS — Z1211 Encounter for screening for malignant neoplasm of colon: Secondary | ICD-10-CM | POA: Diagnosis not present

## 2020-11-22 DIAGNOSIS — R152 Fecal urgency: Secondary | ICD-10-CM | POA: Diagnosis not present

## 2020-11-22 DIAGNOSIS — R195 Other fecal abnormalities: Secondary | ICD-10-CM | POA: Diagnosis not present

## 2020-11-22 DIAGNOSIS — R194 Change in bowel habit: Secondary | ICD-10-CM | POA: Diagnosis not present

## 2020-11-22 MED ORDER — PEG 3350-KCL-NA BICARB-NACL 420 G PO SOLR
ORAL | 0 refills | Status: DC
  Filled 2020-11-22 – 2020-12-05 (×2): qty 4000, 1d supply, fill #0

## 2020-11-30 ENCOUNTER — Other Ambulatory Visit (HOSPITAL_COMMUNITY): Payer: Self-pay

## 2020-12-02 ENCOUNTER — Inpatient Hospital Stay (HOSPITAL_BASED_OUTPATIENT_CLINIC_OR_DEPARTMENT_OTHER): Payer: 59 | Admitting: Internal Medicine

## 2020-12-02 ENCOUNTER — Inpatient Hospital Stay: Payer: 59 | Attending: Internal Medicine

## 2020-12-02 ENCOUNTER — Inpatient Hospital Stay: Payer: 59

## 2020-12-02 ENCOUNTER — Encounter: Payer: Self-pay | Admitting: Internal Medicine

## 2020-12-02 DIAGNOSIS — Z905 Acquired absence of kidney: Secondary | ICD-10-CM | POA: Diagnosis not present

## 2020-12-02 DIAGNOSIS — K625 Hemorrhage of anus and rectum: Secondary | ICD-10-CM | POA: Diagnosis not present

## 2020-12-02 DIAGNOSIS — N183 Chronic kidney disease, stage 3 unspecified: Secondary | ICD-10-CM | POA: Insufficient documentation

## 2020-12-02 DIAGNOSIS — I429 Cardiomyopathy, unspecified: Secondary | ICD-10-CM | POA: Insufficient documentation

## 2020-12-02 DIAGNOSIS — Z8249 Family history of ischemic heart disease and other diseases of the circulatory system: Secondary | ICD-10-CM | POA: Diagnosis not present

## 2020-12-02 DIAGNOSIS — Z79899 Other long term (current) drug therapy: Secondary | ICD-10-CM | POA: Insufficient documentation

## 2020-12-02 DIAGNOSIS — Z7901 Long term (current) use of anticoagulants: Secondary | ICD-10-CM | POA: Insufficient documentation

## 2020-12-02 DIAGNOSIS — D649 Anemia, unspecified: Secondary | ICD-10-CM | POA: Insufficient documentation

## 2020-12-02 DIAGNOSIS — R5383 Other fatigue: Secondary | ICD-10-CM | POA: Insufficient documentation

## 2020-12-02 DIAGNOSIS — D751 Secondary polycythemia: Secondary | ICD-10-CM

## 2020-12-02 DIAGNOSIS — Z833 Family history of diabetes mellitus: Secondary | ICD-10-CM | POA: Insufficient documentation

## 2020-12-02 DIAGNOSIS — I129 Hypertensive chronic kidney disease with stage 1 through stage 4 chronic kidney disease, or unspecified chronic kidney disease: Secondary | ICD-10-CM | POA: Diagnosis not present

## 2020-12-02 DIAGNOSIS — Z87891 Personal history of nicotine dependence: Secondary | ICD-10-CM | POA: Diagnosis not present

## 2020-12-02 DIAGNOSIS — E1122 Type 2 diabetes mellitus with diabetic chronic kidney disease: Secondary | ICD-10-CM | POA: Insufficient documentation

## 2020-12-02 DIAGNOSIS — I4891 Unspecified atrial fibrillation: Secondary | ICD-10-CM | POA: Diagnosis not present

## 2020-12-02 DIAGNOSIS — G4733 Obstructive sleep apnea (adult) (pediatric): Secondary | ICD-10-CM | POA: Insufficient documentation

## 2020-12-02 LAB — HEMOGLOBIN AND HEMATOCRIT, BLOOD
HCT: 38.8 % — ABNORMAL LOW (ref 39.0–52.0)
Hemoglobin: 11.9 g/dL — ABNORMAL LOW (ref 13.0–17.0)

## 2020-12-02 NOTE — Assessment & Plan Note (Addendum)
#   Erythrocytosis isolated-suspect secondary/question OSA; however patient  feels significantly improved post phlebotomy.  Today hemoglobin is 11.9 hematocrit 39.  Obviously for phlebotomy.  See below  #Anemia/rectal Bleeding- [on Eliquis since April 2022]- ? hemorridal versus others.  Discussed that hemorrhoidal bleeding usually does not cause iron deficiency.  Discussed with GI-agrees to evaluate the patient sooner.  # A.fib- with RVR-on Eliquis.  # OSA- on CPAP stable.  # DISPOSITION:  # HOLD Phlebotomy today # H&H in 3 months; possible phlebotomy # follow up in 6 months-MD- H&H/possible phlebotomy- Dr.B
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
## 2020-12-05 ENCOUNTER — Other Ambulatory Visit: Payer: Self-pay

## 2020-12-05 ENCOUNTER — Telehealth: Payer: Self-pay | Admitting: Cardiovascular Disease

## 2020-12-05 DIAGNOSIS — E119 Type 2 diabetes mellitus without complications: Secondary | ICD-10-CM | POA: Diagnosis not present

## 2020-12-05 DIAGNOSIS — I10 Essential (primary) hypertension: Secondary | ICD-10-CM | POA: Diagnosis not present

## 2020-12-05 DIAGNOSIS — I482 Chronic atrial fibrillation, unspecified: Secondary | ICD-10-CM | POA: Diagnosis not present

## 2020-12-05 DIAGNOSIS — J441 Chronic obstructive pulmonary disease with (acute) exacerbation: Secondary | ICD-10-CM | POA: Diagnosis not present

## 2020-12-05 MED ORDER — AMOXICILLIN-POT CLAVULANATE 875-125 MG PO TABS
ORAL_TABLET | ORAL | 0 refills | Status: DC
  Filled 2020-12-05: qty 20, 10d supply, fill #0

## 2020-12-05 MED ORDER — LAGEVRIO 200 MG PO CAPS
ORAL_CAPSULE | ORAL | 0 refills | Status: DC
  Filled 2020-12-05: qty 40, 5d supply, fill #0

## 2020-12-05 MED ORDER — PREDNISONE 20 MG PO TABS
ORAL_TABLET | ORAL | 0 refills | Status: DC
  Filled 2020-12-05: qty 5, 5d supply, fill #0

## 2020-12-05 NOTE — Telephone Encounter (Signed)
   [facility] Pre-operative Risk Assessment    Patient Name: Tracy Gardner  DOB: [birth year] MRN: [id number]  HEARTCARE STAFF:  - IMPORTANT!!!!!! Under Visit Info/Reason for Call, type in Other and utilize the format Clearance MM/DD/YY or Clearance TBD. Do not use dashes or single digits. - Please review there is not already an duplicate clearance open for this procedure. - If request is for dental extraction, please clarify the # of teeth to be extracted. - If the patient is currently at the dentist's office, call Pre-Op Callback Staff (MA/nurse) to input urgent request.  - If the patient is not currently in the dentist office, please route to the Pre-Op pool.  Request for surgical clearance:  What type of surgery is being performed? Colonoscopy  When is this surgery scheduled? 12/07/20  What type of clearance is required (medical clearance vs. Pharmacy clearance to hold med vs. Both)? both  Are there any medications that need to be held prior to surgery and how long? Eliquis  Practice name and name of physician performing surgery? [facility]  What is the office phone number? [phone]   7.   What is the office fax number? [phone]  8.   Anesthesia type (None, local, MAC, general) ? General   Gabriel Cirri L Melina Mosteller 12/05/2020, 9:41 AM  _________________________________________________________________   (provider comments below)

## 2020-12-05 NOTE — Telephone Encounter (Signed)
Patient with diagnosis of atrial fibrillation on Eliquis for anticoagulation.    Procedure: colonoscopy Date of procedure: 12/07/20   CHA2DS2-VASc Score = 2   This indicates a 2.2% annual risk of stroke. The patient's score is based upon: CHF History: 1 HTN History: 0 Diabetes History: 1 Stroke History: 0 Vascular Disease History: 0 Age Score: 0 Gender Score: 0     CrCl 80 (with adjusted body weight) Platelet count 184  Per office protocol, patient can hold Eliquis for 2 days prior to procedure.    Patient should restart Eliquis on the evening of procedure or day after, at discretion of procedure MD

## 2020-12-05 NOTE — Telephone Encounter (Signed)
   Name: Tracy Gardner  DOB: 21-Jul-1965  MRN: [id number]   Primary Cardiologist: [name], MD  Chart reviewed as part of pre-operative protocol coverage. Patient was contacted 12/05/2020 in reference to pre-operative risk assessment for pending surgery as outlined below.  Tracy Gardner was last seen 09/2020 by Dr. Rockey Situ, chart reviewed. Last echo 09/2020 showed normalized LVEF. I reached out to patient for update on how he is doing. The patient affirms he has been doing well from heart standpoint without any new cardiac symptoms. Therefore, based on ACC/AHA guidelines, the patient would be at acceptable risk for the planned procedure without further cardiovascular testing. The patient was advised that if he develops new symptoms prior to surgery to contact our office to arrange for a follow-up visit, and he verbalized understanding. He does report he is going to see PCP today for cough so will defer to GI team whether this impacts scheduling of colonoscopy.   Per pharmD review of anticoagulation recommendation, "Per office protocol, patient can hold Eliquis for 2 days prior to procedure. Patient should restart Eliquis on the evening of procedure or day after, at discretion of procedure MD" Patient reports he was called by someone this AM and told not to take Eliquis starting today.  I will route this recommendation to the requesting party via Epic fax function and remove from pre-op pool. Please call with questions.  Charlie Pitter, PA-C 12/05/2020, 10:39 AM

## 2020-12-05 NOTE — Telephone Encounter (Signed)
Urgent clearance date reviewed - will route to pharm high priority then patient will need call. Colonoscopy scheduled in EMR in 02/2021 but outside notes reviewed may indicate plan on moving it up.

## 2020-12-07 ENCOUNTER — Encounter: Admission: RE | Payer: Self-pay | Source: Home / Self Care

## 2020-12-07 ENCOUNTER — Ambulatory Visit: Admission: RE | Admit: 2020-12-07 | Payer: 59 | Source: Home / Self Care | Admitting: Internal Medicine

## 2020-12-07 SURGERY — COLONOSCOPY
Anesthesia: General

## 2020-12-08 ENCOUNTER — Ambulatory Visit (INDEPENDENT_AMBULATORY_CARE_PROVIDER_SITE_OTHER): Payer: 59 | Admitting: Primary Care

## 2020-12-08 ENCOUNTER — Encounter: Payer: Self-pay | Admitting: Primary Care

## 2020-12-08 VITALS — BP 128/78 | HR 67 | Temp 98.1°F | Ht 71.0 in | Wt 264.2 lb

## 2020-12-08 DIAGNOSIS — Z9989 Dependence on other enabling machines and devices: Secondary | ICD-10-CM | POA: Diagnosis not present

## 2020-12-08 DIAGNOSIS — G4733 Obstructive sleep apnea (adult) (pediatric): Secondary | ICD-10-CM
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
## 2020-12-08 NOTE — Assessment & Plan Note (Signed)
-   Patient has hx severe sleep apnea. Split night sleep study 08/25/20 >> AHI 60/8/hr. He underwent successful titration study recommending pressure 17cm h20 or auto 10-17cm h20.  - Patient is 100% compliant with CPAP and reports significant benefit from use. He feels CPAP is giving off too much pressure at times and would like it lowered. Current CPAP pressure 10-17cm h20; residual AHI 1.7. We have room to adjust pressure, recommend lowering setting 10-15cm h20.  - He was provided letter for work stating his compliance with CPAP therapy. Continue to encourage weight loss efforts. Follow-up in 6 months or sooner if needed.

## 2020-12-08 NOTE — Patient Instructions (Addendum)
Orders: Please adjust current CPAP machine pressure 10-15cm h20 and provide patient with new CPAP machine   Follow-up: 6 months with Eustaquio Maize NP

## 2020-12-08 NOTE — Progress Notes (Signed)
Reviewed and agree with assessment/plan.   Chesley Mires, MD [facility] Pulmonary/Critical Care 12/08/2020, 1:24 PM Pager:  [phone]

## 2020-12-08 NOTE — Addendum Note (Signed)
Addended by: Vivia Ewing on: 12/08/2020 11:12 AM   Modules accepted: Orders

## 2020-12-12 ENCOUNTER — Telehealth: Payer: Self-pay | Admitting: Cardiovascular Disease

## 2020-12-12 DIAGNOSIS — Z0000 Encounter for general adult medical examination without abnormal findings: Secondary | ICD-10-CM | POA: Diagnosis not present

## 2020-12-12 NOTE — Telephone Encounter (Signed)
Follow up:    Patient is calling he now needs a letter stating that it is ok for him to drive for with AFIB and also because he is taken Eliquis.

## 2020-12-12 NOTE — Telephone Encounter (Signed)
New Message:     Patient says he need a note to return to work. It needs to state what his Ejection Fraction is, that will determine whether he will be able to drive for his job, You can put this letter on My Chart and he can print it from there,

## 2020-12-13 ENCOUNTER — Telehealth: Payer: Self-pay | Admitting: Primary Care

## 2020-12-13 NOTE — Progress Notes (Signed)
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

## 2020-12-13 NOTE — Telephone Encounter (Signed)
Letter uploaded to pt's MyChart Hard copy also mailed to pt for his records

## 2020-12-13 NOTE — Telephone Encounter (Signed)
Pt states BW was supposed to adjust cpap settings and order bipap for pt. Pt contacted [facility] and they have no record of this. Appears orders were sent to [facility]. Pt currently uses Therapist, sports in [place]. Please advise [phone]

## 2020-12-13 NOTE — Telephone Encounter (Signed)
[facility] in [place] is part of [place] per [place]. Epic referral shows order confirmed by [facility]. I have sent Brad a community msg.

## 2020-12-14 NOTE — Telephone Encounter (Signed)
Received the following msg from [place] with [place], Terance Hart, CMA; Darl Householder,   Yes, [facility] is part of [place]. The order is in and pending review / change.   Thank you,   Brad New   Pt notified of response and states nothing further needed

## 2020-12-14 NOTE — Telephone Encounter (Signed)
Patient checking on order for CPAP machine. Getting thru [facility]. Patient phone number is [phone].

## 2020-12-15 ENCOUNTER — Other Ambulatory Visit: Payer: Self-pay

## 2020-12-16 ENCOUNTER — Other Ambulatory Visit: Payer: Self-pay

## 2020-12-19 ENCOUNTER — Other Ambulatory Visit: Payer: Self-pay

## 2020-12-19 MED ORDER — JARDIANCE 10 MG PO TABS
10.0000 mg | ORAL_TABLET | Freq: Every day | ORAL | 11 refills | Status: DC
  Filled 2020-12-19: qty 30, 30d supply, fill #0

## 2020-12-26 ENCOUNTER — Other Ambulatory Visit: Payer: Self-pay

## 2020-12-26 ENCOUNTER — Telehealth: Payer: Self-pay | Admitting: Cardiovascular Disease

## 2020-12-26 NOTE — Telephone Encounter (Signed)
Return call to pt's wife, Tracy Gardner (DPR approved), she voiced that her husband, Tracy Gardner has been having rectal bleeding since April, "which we told Dr. Rockey Situ at last visit, but it was seldom here and there". Tracy Gardner reports now the rectal bleeding has increased with every BM, "mostly bright red in color and sometimes dark".   Both have spoken with PCP and Tracy Gardner cancer provider regarding same. Pt has colonoscopy schedule for 03/08/2021 "which is the soonest they can get him in".   Component Ref Range & Units 3 wk ago  (12/02/20) 2 mo ago  (09/30/20) 5 mo ago  (07/22/20) 5 mo ago  (07/09/20) 5 mo ago  (07/08/20) 5 mo ago  (07/07/20) 6 mo ago  (06/03/20) 6 mo ago  (06/03/20)  Hemoglobin 13.0 - 17.0 g/dL 11.9 Low   13.2  16.5 R  15.5  15.4  17.0   16.2 CM   HCT 39.0 - 52.0 % 38.8 Low   42.2 CM  50.7 R  49.2        Hemoglobin is trending down, advised to have PCP send in for another HgB to see his levels as his bleeding as increased. If still trending down, may be able to have GI push up his colonoscopy.   Pt is on Eliquis 5 mg BID, if HgB is trending down and pt rectal bleeding is worsening, then may need to hold anticoagulation until cleared by GI. Tracy Gardner verbalized understanding. Pt has appt with [place] PCP on Wed, will call to have them order HgB for that appt. Advised this RN will f/u with lab results and consult Dr. Rockey Situ for advice on pt's Eliquis.   Tracy Gardner is thankful for returning her call, will have Tracy Gardner get an updated HgB and will await to see if Dr. Rockey Situ wants Tracy Gardner to hold his Eliquis until further notice. Otherwise all questions or concerns were address and no additional concerns at this time.  Will call back for anything further.

## 2020-12-26 NOTE — Telephone Encounter (Signed)
Patient's wife states he has been bleeding rectally since he has been taking Eliquis. She states he cannot get in to the surgeon until December. Please call to discuss what to do until then.

## 2020-12-28 DIAGNOSIS — D649 Anemia, unspecified: Secondary | ICD-10-CM | POA: Diagnosis not present

## 2020-12-28 NOTE — Telephone Encounter (Signed)
Pt was able to have labs completed this am to monitor HgB at [facility], appears up from 3 weeks ago at HgB 11.9 Will route to Dr. Rockey Situ to see if pt should stay on Eliquis considering on-going rectal bleed Pt suppose to have a colonoscopy in December    Lab Results - documented in this encounter (ABNORMAL) CBC w/auto Differential (5 Part) (12/28/2020 8:55 AM EDT) Lab Results - (ABNORMAL) CBC w/auto Differential (5 Part) (12/28/2020 8:55 AM EDT) Component Value Ref Range Test Method Analysis Time Performed At Pathologist Signature  WBC (White Blood Cell Count) 6.4 4.1 - 10.2 10^3/uL   12/28/2020 9:22 AM EDT [place] - LAB   RBC (Red Blood Cell Count) 5.16 4.69 - 6.13 10^6/uL   12/28/2020 9:22 AM EDT [facility] - LAB   Hemoglobin 13.1 (L) 14.1 - 18.1 gm/dL   12/28/2020 9:22 AM EDT [place] - LAB   Hematocrit 43.4 40.0 - 52.0 %   12/28/2020 9:22 AM EDT [place] - LAB   MCV (Mean Corpuscular Volume) 84.1 80.0 - 100.0 fl   12/28/2020 9:22 AM EDT [place] - LAB   MCH (Mean Corpuscular Hemoglobin) 25.4 (L) 27.0 - 31.2 pg   12/28/2020 9:22 AM EDT [place] - LAB   MCHC (Mean Corpuscular Hemoglobin Concentration) 30.2 (L) 32.0 - 36.0 gm/dL   12/28/2020 9:22 AM EDT [place] - LAB   Platelet Count 230 150 - 450 10^3/uL   12/28/2020 9:22 AM EDT [place] - LAB   RDW-CV (Red Cell Distribution Width) 17.5 (H) 11.6 - 14.8 %   12/28/2020 9:22 AM EDT [place] - LAB   MPV (Mean Platelet Volume) 10.3 9.4 - 12.4 fl   12/28/2020 9:22 AM EDT [place] - LAB

## 2020-12-28 NOTE — Telephone Encounter (Signed)
Was able to reach back out to [place], pt's wife (DPR approved), to f/u on pt's HgB results from today and Dr. Donivan Scull response on Eliquis.  "If still notable, could hold the Eliquis at least until we get more lab work numbers to know which direction it is going in  Last I saw was 8 down to 11 something  Not sure if the next normal drop lower  Looks like there is a lab order in for repeat CBC?  If not we can put one in  Thx  TG "  Verbally consulted Dr. Rockey Situ regarding pt's HgB 2 months ago was 13.2, 3 weeks ago 11.9, today back up to 13.1 At this time, Dr. Rockey Situ is okay with staying on p'ts Eliquis and to f/u with GI regarding his on-going rectal bleed. Make sure to use stool softeners for any hard stools as hemorrhoids can emerge and cause bleeding. If pt has colitis or diverticulitis, that can cause rectal bleeding as well. Watch HgB if bleeding continues to monitor for numbers to trend downward, if so f/u with GI, if too low then can stop Eliquis until clear with GI.   Mrs. Ozbun verbalized understanding. Advised Mrs. Testa after looking at pt's EMR, it appears Chen's colonoscopy may have be canceled and it is not showing up on appt desk. Suggest calling to confirm as this was expected for pt per wife and unaware of cancellation, as they have been trying to get sooner appt.   Marcie Bal very thankful for the return call and updated information, she will reach out to GI at this time and call back for any cardiac or Eliquis concerns.

## 2021-01-12 ENCOUNTER — Other Ambulatory Visit: Payer: Self-pay

## 2021-02-17 ENCOUNTER — Other Ambulatory Visit: Payer: Self-pay

## 2021-02-24 ENCOUNTER — Other Ambulatory Visit: Payer: Self-pay

## 2021-02-24 ENCOUNTER — Ambulatory Visit: Payer: 59 | Attending: Internal Medicine

## 2021-02-24 MED ORDER — FLUARIX QUADRIVALENT 0.5 ML IM SUSY
PREFILLED_SYRINGE | INTRAMUSCULAR | 0 refills | Status: DC
  Filled 2021-02-24: qty 0.5, 1d supply, fill #0

## 2021-03-07 ENCOUNTER — Emergency Department: Payer: 59

## 2021-03-07 ENCOUNTER — Encounter: Payer: Self-pay | Admitting: Internal Medicine

## 2021-03-07 ENCOUNTER — Other Ambulatory Visit: Payer: Self-pay

## 2021-03-07 ENCOUNTER — Encounter: Payer: Self-pay | Admitting: Emergency Medicine

## 2021-03-07 DIAGNOSIS — C187 Malignant neoplasm of sigmoid colon: Secondary | ICD-10-CM | POA: Diagnosis not present

## 2021-03-07 DIAGNOSIS — K5730 Diverticulosis of large intestine without perforation or abscess without bleeding: Secondary | ICD-10-CM | POA: Diagnosis not present

## 2021-03-07 DIAGNOSIS — E1122 Type 2 diabetes mellitus with diabetic chronic kidney disease: Secondary | ICD-10-CM | POA: Diagnosis present

## 2021-03-07 DIAGNOSIS — D6832 Hemorrhagic disorder due to extrinsic circulating anticoagulants: Secondary | ICD-10-CM | POA: Diagnosis present

## 2021-03-07 DIAGNOSIS — D75838 Other thrombocytosis: Secondary | ICD-10-CM | POA: Diagnosis present

## 2021-03-07 DIAGNOSIS — C189 Malignant neoplasm of colon, unspecified: Principal | ICD-10-CM | POA: Diagnosis present

## 2021-03-07 DIAGNOSIS — Z7901 Long term (current) use of anticoagulants: Secondary | ICD-10-CM | POA: Diagnosis not present

## 2021-03-07 DIAGNOSIS — Z8249 Family history of ischemic heart disease and other diseases of the circulatory system: Secondary | ICD-10-CM | POA: Diagnosis not present

## 2021-03-07 DIAGNOSIS — Z6835 Body mass index (BMI) 35.0-35.9, adult: Secondary | ICD-10-CM

## 2021-03-07 DIAGNOSIS — D751 Secondary polycythemia: Secondary | ICD-10-CM | POA: Diagnosis present

## 2021-03-07 DIAGNOSIS — E785 Hyperlipidemia, unspecified: Secondary | ICD-10-CM | POA: Diagnosis present

## 2021-03-07 DIAGNOSIS — Y929 Unspecified place or not applicable: Secondary | ICD-10-CM | POA: Diagnosis not present

## 2021-03-07 DIAGNOSIS — K921 Melena: Secondary | ICD-10-CM | POA: Diagnosis present

## 2021-03-07 DIAGNOSIS — L539 Erythematous condition, unspecified: Secondary | ICD-10-CM | POA: Diagnosis not present

## 2021-03-07 DIAGNOSIS — I4819 Other persistent atrial fibrillation: Secondary | ICD-10-CM | POA: Diagnosis not present

## 2021-03-07 DIAGNOSIS — T45515A Adverse effect of anticoagulants, initial encounter: Secondary | ICD-10-CM | POA: Diagnosis present

## 2021-03-07 DIAGNOSIS — K6289 Other specified diseases of anus and rectum: Secondary | ICD-10-CM | POA: Diagnosis not present

## 2021-03-07 DIAGNOSIS — Z20822 Contact with and (suspected) exposure to covid-19: Secondary | ICD-10-CM | POA: Diagnosis not present

## 2021-03-07 DIAGNOSIS — Z87891 Personal history of nicotine dependence: Secondary | ICD-10-CM | POA: Diagnosis not present

## 2021-03-07 DIAGNOSIS — J9811 Atelectasis: Secondary | ICD-10-CM | POA: Diagnosis not present

## 2021-03-07 DIAGNOSIS — I10 Essential (primary) hypertension: Secondary | ICD-10-CM | POA: Diagnosis not present

## 2021-03-07 DIAGNOSIS — D649 Anemia, unspecified: Secondary | ICD-10-CM | POA: Diagnosis not present

## 2021-03-07 DIAGNOSIS — K319 Disease of stomach and duodenum, unspecified: Secondary | ICD-10-CM | POA: Diagnosis not present

## 2021-03-07 DIAGNOSIS — N1831 Chronic kidney disease, stage 3a: Secondary | ICD-10-CM | POA: Diagnosis not present

## 2021-03-07 DIAGNOSIS — K625 Hemorrhage of anus and rectum: Secondary | ICD-10-CM | POA: Diagnosis not present

## 2021-03-07 DIAGNOSIS — I2510 Atherosclerotic heart disease of native coronary artery without angina pectoris: Secondary | ICD-10-CM | POA: Diagnosis not present

## 2021-03-07 DIAGNOSIS — J811 Chronic pulmonary edema: Secondary | ICD-10-CM | POA: Diagnosis not present

## 2021-03-07 DIAGNOSIS — K6389 Other specified diseases of intestine: Secondary | ICD-10-CM | POA: Diagnosis not present

## 2021-03-07 DIAGNOSIS — E118 Type 2 diabetes mellitus with unspecified complications: Secondary | ICD-10-CM | POA: Diagnosis not present

## 2021-03-07 DIAGNOSIS — D62 Acute posthemorrhagic anemia: Secondary | ICD-10-CM | POA: Diagnosis not present

## 2021-03-07 DIAGNOSIS — Z79899 Other long term (current) drug therapy: Secondary | ICD-10-CM

## 2021-03-07 DIAGNOSIS — G4733 Obstructive sleep apnea (adult) (pediatric): Secondary | ICD-10-CM | POA: Diagnosis not present

## 2021-03-07 DIAGNOSIS — I129 Hypertensive chronic kidney disease with stage 1 through stage 4 chronic kidney disease, or unspecified chronic kidney disease: Secondary | ICD-10-CM | POA: Diagnosis present

## 2021-03-07 DIAGNOSIS — K922 Gastrointestinal hemorrhage, unspecified: Secondary | ICD-10-CM | POA: Diagnosis not present

## 2021-03-07 DIAGNOSIS — K639 Disease of intestine, unspecified: Secondary | ICD-10-CM | POA: Diagnosis not present

## 2021-03-07 DIAGNOSIS — J90 Pleural effusion, not elsewhere classified: Secondary | ICD-10-CM | POA: Diagnosis not present

## 2021-03-07 DIAGNOSIS — Z833 Family history of diabetes mellitus: Secondary | ICD-10-CM

## 2021-03-07 DIAGNOSIS — Q600 Renal agenesis, unilateral: Secondary | ICD-10-CM | POA: Diagnosis not present

## 2021-03-07 LAB — TYPE AND SCREEN
ABO/RH(D): A POS
Antibody Screen: NEGATIVE

## 2021-03-07 LAB — CBC WITH DIFFERENTIAL/PLATELET
Abs Immature Granulocytes: 0.01 10*3/uL (ref 0.00–0.07)
Basophils Absolute: 0.1 10*3/uL (ref 0.0–0.1)
Basophils Relative: 2 %
Eosinophils Absolute: 0.3 10*3/uL (ref 0.0–0.5)
Eosinophils Relative: 5 %
HCT: 32.4 % — ABNORMAL LOW (ref 39.0–52.0)
Hemoglobin: 9.6 g/dL — ABNORMAL LOW (ref 13.0–17.0)
Immature Granulocytes: 0 %
Lymphocytes Relative: 22 %
Lymphs Abs: 1 10*3/uL (ref 0.7–4.0)
MCH: 22.6 pg — ABNORMAL LOW (ref 26.0–34.0)
MCHC: 29.6 g/dL — ABNORMAL LOW (ref 30.0–36.0)
MCV: 76.4 fL — ABNORMAL LOW (ref 80.0–100.0)
Monocytes Absolute: 0.6 10*3/uL (ref 0.1–1.0)
Monocytes Relative: 12 %
Neutro Abs: 2.8 10*3/uL (ref 1.7–7.7)
Neutrophils Relative %: 59 %
Platelets: 210 10*3/uL (ref 150–400)
RBC: 4.24 MIL/uL (ref 4.22–5.81)
RDW: 16.2 % — ABNORMAL HIGH (ref 11.5–15.5)
WBC: 4.7 10*3/uL (ref 4.0–10.5)
nRBC: 0 % (ref 0.0–0.2)

## 2021-03-07 LAB — COMPREHENSIVE METABOLIC PANEL
ALT: 26 U/L (ref 0–44)
AST: 20 U/L (ref 15–41)
Albumin: 3.7 g/dL (ref 3.5–5.0)
Alkaline Phosphatase: 69 U/L (ref 38–126)
Anion gap: 4 — ABNORMAL LOW (ref 5–15)
BUN: 13 mg/dL (ref 6–20)
CO2: 27 mmol/L (ref 22–32)
Calcium: 8.6 mg/dL — ABNORMAL LOW (ref 8.9–10.3)
Chloride: 106 mmol/L (ref 98–111)
Creatinine, Ser: 1.37 mg/dL — ABNORMAL HIGH (ref 0.61–1.24)
GFR, Estimated: >60 mL/min (ref >60)
Glucose, Bld: 106 mg/dL — ABNORMAL HIGH (ref 70–99)
Potassium: 4 mmol/L (ref 3.5–5.1)
Sodium: 137 mmol/L (ref 135–145)
Total Bilirubin: 0.8 mg/dL (ref 0.3–1.2)
Total Protein: 7.2 g/dL (ref 6.5–8.1)

## 2021-03-07 LAB — PROTIME-INR
INR: 1.1 (ref 0.8–1.2)
Prothrombin Time: 14.5 seconds (ref 11.4–15.2)

## 2021-03-07 LAB — BRAIN NATRIURETIC PEPTIDE: B Natriuretic Peptide: 133.1 pg/mL — ABNORMAL HIGH (ref 0.0–100.0)

## 2021-03-07 NOTE — ED Provider Notes (Signed)
Emergency Medicine Provider Triage Evaluation Note  Tracy Gardner , a 55 y.o. male  was evaluated in triage.  Pt complains of bright red blood per rectum and melanotic stools for the last several months.  He reports most recent episode yesterday.  Patient is currently on Eliquis for A. fib, and is scheduled to have a routine colonoscopy tomorrow.  The office failed to call with the patient prep instructions, and suggested he report to the ED for evaluation of his GI bleeding.  Patient presents with his wife who notes that the patient was faint as he walked to the car earlier today, and has a generally he looks pale in comparison to his normal complexion.  Patient reports chills and fatigue.  He denies any frank chest pain.  He does note weight gain of the last several days, as well as edema to the hands and legs.  Review of Systems  Positive: GI bleeding (BRB/melena), fluid retention Negative: Fevers, hematuria  Physical Exam  BP (!) 114/52 (BP Location: Right Arm)    Pulse (!) 49    Temp 98 F (36.7 C) (Oral)    Resp 20    Ht 5\' 11"  (1.803 m)    Wt 116.1 kg    SpO2 96%    BMI 35.70 kg/m  Gen:   Awake, no distress  NAD Resp:  Normal effort CTA MSK:   Moves extremities without difficulty bilareral UE/LE edema noted Other:  CVS: RRR  Medical Decision Making  Medically screening exam initiated at 4:23 PM.  Appropriate orders placed.  Tracy Gardner was informed that the remainder of the evaluation will be completed by another provider, this initial triage assessment does not replace that evaluation, and the importance of remaining in the ED until their evaluation is complete.  Patient ED evaluation of GI bleed including episodes of BRB and melanotic stools with last several weeks.   Tracy Needles, PA-C 03/07/21 1629    Tracy Pear, MD 03/07/21 [phone]

## 2021-03-07 NOTE — ED Triage Notes (Signed)
Pt to ED via POV with c/o bright red blood and dark tarry stool also for about a year now. He has been followed by GI, he was suppose to have a colonoscopy tomorrow but was not called with the prep, they called the GI doc and they told him to come here to be evaluated. Pt is on Eliquist and lasix. He did take his morning does of Eliquist yesterday but not the evening one.

## 2021-03-08 ENCOUNTER — Encounter: Payer: Self-pay | Admitting: Anesthesiology

## 2021-03-08 ENCOUNTER — Ambulatory Visit: Admission: RE | Admit: 2021-03-08 | Payer: 59 | Source: Home / Self Care | Admitting: Internal Medicine

## 2021-03-08 ENCOUNTER — Inpatient Hospital Stay
Admission: EM | Admit: 2021-03-08 | Discharge: 2021-03-10 | DRG: 375 | Disposition: A | Discharge status: Home / Self Care | Payer: 59 | Attending: Family Medicine | Admitting: Family Medicine

## 2021-03-08 ENCOUNTER — Encounter: Payer: Self-pay | Admitting: Internal Medicine

## 2021-03-08 ENCOUNTER — Encounter
Admission: EM | Disposition: A | Discharge status: Home / Self Care | Payer: Self-pay | Source: Home / Self Care | Attending: Family Medicine

## 2021-03-08 DIAGNOSIS — I480 Paroxysmal atrial fibrillation: Secondary | ICD-10-CM | POA: Diagnosis present

## 2021-03-08 DIAGNOSIS — D649 Anemia, unspecified: Secondary | ICD-10-CM | POA: Diagnosis present

## 2021-03-08 DIAGNOSIS — Z6835 Body mass index (BMI) 35.0-35.9, adult: Secondary | ICD-10-CM | POA: Diagnosis not present

## 2021-03-08 DIAGNOSIS — N1831 Chronic kidney disease, stage 3a: Secondary | ICD-10-CM | POA: Diagnosis not present

## 2021-03-08 DIAGNOSIS — E118 Type 2 diabetes mellitus with unspecified complications: Secondary | ICD-10-CM | POA: Diagnosis not present

## 2021-03-08 DIAGNOSIS — C189 Malignant neoplasm of colon, unspecified: Secondary | ICD-10-CM | POA: Insufficient documentation

## 2021-03-08 DIAGNOSIS — D62 Acute posthemorrhagic anemia: Secondary | ICD-10-CM | POA: Diagnosis present

## 2021-03-08 DIAGNOSIS — E785 Hyperlipidemia, unspecified: Secondary | ICD-10-CM | POA: Diagnosis present

## 2021-03-08 DIAGNOSIS — C187 Malignant neoplasm of sigmoid colon: Secondary | ICD-10-CM

## 2021-03-08 DIAGNOSIS — D751 Secondary polycythemia: Secondary | ICD-10-CM | POA: Diagnosis not present

## 2021-03-08 DIAGNOSIS — D500 Iron deficiency anemia secondary to blood loss (chronic): Secondary | ICD-10-CM

## 2021-03-08 DIAGNOSIS — I10 Essential (primary) hypertension: Secondary | ICD-10-CM | POA: Diagnosis present

## 2021-03-08 DIAGNOSIS — K6389 Other specified diseases of intestine: Secondary | ICD-10-CM | POA: Diagnosis not present

## 2021-03-08 DIAGNOSIS — Z833 Family history of diabetes mellitus: Secondary | ICD-10-CM | POA: Diagnosis not present

## 2021-03-08 DIAGNOSIS — K922 Gastrointestinal hemorrhage, unspecified: Secondary | ICD-10-CM

## 2021-03-08 DIAGNOSIS — Z7901 Long term (current) use of anticoagulants: Secondary | ICD-10-CM | POA: Diagnosis not present

## 2021-03-08 DIAGNOSIS — Z79899 Other long term (current) drug therapy: Secondary | ICD-10-CM | POA: Diagnosis not present

## 2021-03-08 DIAGNOSIS — I429 Cardiomyopathy, unspecified: Secondary | ICD-10-CM

## 2021-03-08 DIAGNOSIS — Y929 Unspecified place or not applicable: Secondary | ICD-10-CM | POA: Diagnosis not present

## 2021-03-08 DIAGNOSIS — I129 Hypertensive chronic kidney disease with stage 1 through stage 4 chronic kidney disease, or unspecified chronic kidney disease: Secondary | ICD-10-CM | POA: Diagnosis present

## 2021-03-08 DIAGNOSIS — T45515A Adverse effect of anticoagulants, initial encounter: Secondary | ICD-10-CM | POA: Diagnosis present

## 2021-03-08 DIAGNOSIS — K319 Disease of stomach and duodenum, unspecified: Secondary | ICD-10-CM | POA: Diagnosis not present

## 2021-03-08 DIAGNOSIS — D75838 Other thrombocytosis: Secondary | ICD-10-CM | POA: Diagnosis present

## 2021-03-08 DIAGNOSIS — E1122 Type 2 diabetes mellitus with diabetic chronic kidney disease: Secondary | ICD-10-CM | POA: Diagnosis present

## 2021-03-08 DIAGNOSIS — Z87891 Personal history of nicotine dependence: Secondary | ICD-10-CM | POA: Diagnosis not present

## 2021-03-08 DIAGNOSIS — Z8249 Family history of ischemic heart disease and other diseases of the circulatory system: Secondary | ICD-10-CM | POA: Diagnosis not present

## 2021-03-08 DIAGNOSIS — I4819 Other persistent atrial fibrillation: Secondary | ICD-10-CM | POA: Diagnosis present

## 2021-03-08 DIAGNOSIS — D6832 Hemorrhagic disorder due to extrinsic circulating anticoagulants: Secondary | ICD-10-CM | POA: Diagnosis present

## 2021-03-08 DIAGNOSIS — K625 Hemorrhage of anus and rectum: Secondary | ICD-10-CM | POA: Diagnosis not present

## 2021-03-08 DIAGNOSIS — K921 Melena: Secondary | ICD-10-CM | POA: Diagnosis not present

## 2021-03-08 DIAGNOSIS — Z20822 Contact with and (suspected) exposure to covid-19: Secondary | ICD-10-CM | POA: Diagnosis present

## 2021-03-08 LAB — HEMOGLOBIN A1C
Hgb A1c MFr Bld: 6.6 % — ABNORMAL HIGH (ref 4.8–5.6)
Mean Plasma Glucose: 142.72 mg/dL

## 2021-03-08 LAB — CBG MONITORING, ED
Glucose-Capillary: 154 mg/dL — ABNORMAL HIGH (ref 70–99)
Glucose-Capillary: 122 mg/dL — ABNORMAL HIGH (ref 70–99)
Glucose-Capillary: 101 mg/dL — ABNORMAL HIGH (ref 70–99)

## 2021-03-08 LAB — HIV ANTIBODY (ROUTINE TESTING W REFLEX): HIV Screen 4th Generation wRfx: NONREACTIVE

## 2021-03-08 LAB — RESP PANEL BY RT-PCR (FLU A&B, COVID) ARPGX2
Influenza A by PCR: NEGATIVE
Influenza B by PCR: NEGATIVE
SARS Coronavirus 2 by RT PCR: NEGATIVE

## 2021-03-08 LAB — HEMOGLOBIN AND HEMATOCRIT, BLOOD
HCT: 31.6 % — ABNORMAL LOW (ref 39.0–52.0)
Hemoglobin: 9.9 g/dL — ABNORMAL LOW (ref 13.0–17.0)

## 2021-03-08 SURGERY — COLONOSCOPY WITH PROPOFOL
Anesthesia: General

## 2021-03-08 MED ORDER — DIPHENHYDRAMINE HCL 25 MG PO CAPS
50.0000 mg | ORAL_CAPSULE | Freq: Every day | ORAL | Status: DC
  Administered 2021-03-08 – 2021-03-09 (×2): 50 mg via ORAL
  Filled 2021-03-08 (×2): qty 2

## 2021-03-08 MED ORDER — BISOPROLOL FUMARATE 5 MG PO TABS
2.5000 mg | ORAL_TABLET | Freq: Every day | ORAL | Status: DC
  Administered 2021-03-09 – 2021-03-10 (×2): 2.5 mg via ORAL
  Filled 2021-03-08 (×3): qty 0.5

## 2021-03-08 MED ORDER — SODIUM CHLORIDE 0.9 % IV SOLN: INTRAVENOUS | Status: DC

## 2021-03-08 MED ORDER — PANTOPRAZOLE SODIUM 40 MG IV SOLR
40.0000 mg | Freq: Two times a day (BID) | INTRAVENOUS | Status: DC
  Administered 2021-03-08 (×2): 40 mg via INTRAVENOUS
  Filled 2021-03-08 (×2): qty 40

## 2021-03-08 MED ORDER — ROSUVASTATIN CALCIUM 10 MG PO TABS
10.0000 mg | ORAL_TABLET | Freq: Every day | ORAL | Status: DC
  Administered 2021-03-10: 10:00:00 10 mg via ORAL
  Filled 2021-03-08 (×2): qty 1

## 2021-03-08 MED ORDER — ONDANSETRON HCL 4 MG/2ML IJ SOLN: 4.0000 mg | Freq: Four times a day (QID) | INTRAMUSCULAR | Status: DC | PRN

## 2021-03-08 MED ORDER — INSULIN ASPART 100 UNIT/ML IJ SOLN
0.0000 [IU] | Freq: Three times a day (TID) | INTRAMUSCULAR | Status: DC
  Administered 2021-03-10: 12:00:00 2 [IU] via SUBCUTANEOUS
  Filled 2021-03-08: qty 1

## 2021-03-08 MED ORDER — AMIODARONE HCL 200 MG PO TABS
200.0000 mg | ORAL_TABLET | Freq: Every day | ORAL | Status: DC
  Administered 2021-03-10: 10:00:00 200 mg via ORAL
  Filled 2021-03-08: qty 1

## 2021-03-08 MED ORDER — ONDANSETRON HCL 4 MG PO TABS: 4.0000 mg | ORAL_TABLET | Freq: Four times a day (QID) | ORAL | Status: DC | PRN

## 2021-03-08 MED ORDER — ACETAMINOPHEN 325 MG PO TABS: 650.0000 mg | ORAL_TABLET | Freq: Four times a day (QID) | ORAL | Status: DC | PRN

## 2021-03-08 MED ORDER — PEG 3350-KCL-NA BICARB-NACL 420 G PO SOLR
4000.0000 mL | Freq: Once | ORAL | Status: AC
  Administered 2021-03-08: 17:00:00 4000 mL via ORAL
  Filled 2021-03-08 (×2): qty 4000

## 2021-03-08 MED ORDER — LORATADINE 10 MG PO TABS
10.0000 mg | ORAL_TABLET | Freq: Every day | ORAL | Status: DC
  Administered 2021-03-10: 10:00:00 10 mg via ORAL
  Filled 2021-03-08: qty 1

## 2021-03-08 MED ORDER — PANTOPRAZOLE SODIUM 40 MG IV SOLR
40.0000 mg | Freq: Once | INTRAVENOUS | Status: AC
  Administered 2021-03-08: 03:00:00 40 mg via INTRAVENOUS
  Filled 2021-03-08: qty 40

## 2021-03-08 MED ORDER — ACETAMINOPHEN 650 MG RE SUPP: 650.0000 mg | Freq: Four times a day (QID) | RECTAL | Status: DC | PRN

## 2021-03-08 MED ORDER — BISACODYL 5 MG PO TBEC
10.0000 mg | DELAYED_RELEASE_TABLET | Freq: Once | ORAL | Status: AC
  Administered 2021-03-08: 15:00:00 10 mg via ORAL
  Filled 2021-03-08: qty 2
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
## 2021-03-08 NOTE — Progress Notes (Signed)
° °  Patient seen and examined at bedside, patient admitted after midnight, please see earlier detailed admission note by Athena Masse, MD. Briefly, patient presented secondary rectal bleeding. GI consulted for consideration of EGD/Colonoscopy  Subjective: Patient with persistent hematochezia which has been present for the past 8 months. Also has noted some dark/black stool. No hematemesis. Infrequent use of NSAIDs.  BP (!) 103/51    Pulse 62    Temp 98 F (36.7 C) (Oral)    Resp 16    Ht 5\' 11"  (1.803 m)    Wt 116.1 kg    SpO2 98%    BMI 35.70 kg/m   General exam: Appears calm and comfortable Respiratory system: Clear to auscultation. Respiratory effort normal. Cardiovascular system: S1 & S2 heard, RRR. No murmurs, rubs, gallops or clicks. Gastrointestinal system: Abdomen is nondistended, soft and nontender. No organomegaly or masses felt. Normal bowel sounds heard. Central nervous system: Alert and oriented. No focal neurological deficits. Musculoskeletal: No edema. No calf tenderness Skin: No cyanosis. No rashes Psychiatry: Judgement and insight appear normal. Mood & affect appropriate.   Brief assessment/Plan:  Hematochezia Possible melena Patient follows with GI as an outpatient. Plan for colonoscopy as an outpatient, but now presented secondary to associated worsening anemia. Asymptomatic. GI consulted. Protonix IV initiated. -Continue Protonix IV -CBC daily -GI recommendations: pending  Chronic blood loss anemia Secondary to above. History of secondary polycythemia listed in chart. Baseline hemoglobin of 13.2 from 7/22 down to 11.9 from 9/22. Hemoglobin of 9.6 on admission and stable.  Paroxysmal atrial fibrillation Currently sinus rhythm. On Elqiuis, Bisoprolol and amiodarone as an outpatient. Eliquis on hold secondary to above. -Resume home amiodarone and bisoprolol pending medication reconciliation  Diabetes mellitus, type 2 Patient is on Jardiance as an outpatient.  Hemoglobin A1C of 6.5% from 06/17/2020 -SSI while inpatient -Hemoglobin A1C  Primary hypertension -Continue bisoprolol pending medication reconciliation  CKD stage IIIa Stable.  Hyperlipidemia -Continue Crestor   Family communication: Wife at bedside DVT prophylaxis: SCDs Disposition: Discharge home likely in 1-2 days pending GI recommendations/management  Cordelia Poche, MD [facility] 03/08/2021, 8:28 AM

## 2021-03-09 ENCOUNTER — Encounter: Payer: Self-pay | Admitting: Gastroenterology

## 2021-03-09 ENCOUNTER — Inpatient Hospital Stay: Payer: 59 | Admitting: Certified Registered Nurse Anesthetist

## 2021-03-09 ENCOUNTER — Inpatient Hospital Stay: Payer: 59

## 2021-03-09 ENCOUNTER — Encounter
Admission: EM | Disposition: A | Discharge status: Home / Self Care | Payer: Self-pay | Source: Home / Self Care | Attending: Family Medicine

## 2021-03-09 DIAGNOSIS — K625 Hemorrhage of anus and rectum: Secondary | ICD-10-CM

## 2021-03-09 DIAGNOSIS — N1831 Chronic kidney disease, stage 3a: Secondary | ICD-10-CM

## 2021-03-09 DIAGNOSIS — D751 Secondary polycythemia: Secondary | ICD-10-CM

## 2021-03-09 DIAGNOSIS — E118 Type 2 diabetes mellitus with unspecified complications: Secondary | ICD-10-CM

## 2021-03-09 DIAGNOSIS — K6389 Other specified diseases of intestine: Secondary | ICD-10-CM

## 2021-03-09 DIAGNOSIS — K319 Disease of stomach and duodenum, unspecified: Secondary | ICD-10-CM | POA: Diagnosis not present

## 2021-03-09 DIAGNOSIS — C189 Malignant neoplasm of colon, unspecified: Secondary | ICD-10-CM | POA: Insufficient documentation

## 2021-03-09 DIAGNOSIS — K921 Melena: Secondary | ICD-10-CM

## 2021-03-09 DIAGNOSIS — I10 Essential (primary) hypertension: Secondary | ICD-10-CM

## 2021-03-09 HISTORY — PX: ESOPHAGOGASTRODUODENOSCOPY: SHX5428

## 2021-03-09 HISTORY — PX: COLONOSCOPY: SHX5424

## 2021-03-09 LAB — CBC
HCT: 30.6 % — ABNORMAL LOW (ref 39.0–52.0)
Hemoglobin: 9 g/dL — ABNORMAL LOW (ref 13.0–17.0)
MCH: 21.9 pg — ABNORMAL LOW (ref 26.0–34.0)
MCHC: 29.4 g/dL — ABNORMAL LOW (ref 30.0–36.0)
MCV: 74.5 fL — ABNORMAL LOW (ref 80.0–100.0)
Platelets: 189 10*3/uL (ref 150–400)
RBC: 4.11 MIL/uL — ABNORMAL LOW (ref 4.22–5.81)
RDW: 16 % — ABNORMAL HIGH (ref 11.5–15.5)
WBC: 4.2 10*3/uL (ref 4.0–10.5)
nRBC: 0 % (ref 0.0–0.2)

## 2021-03-09 LAB — GLUCOSE, CAPILLARY
Glucose-Capillary: 104 mg/dL — ABNORMAL HIGH (ref 70–99)
Glucose-Capillary: 111 mg/dL — ABNORMAL HIGH (ref 70–99)
Glucose-Capillary: 94 mg/dL (ref 70–99)
Glucose-Capillary: 121 mg/dL — ABNORMAL HIGH (ref 70–99)

## 2021-03-09 SURGERY — COLONOSCOPY
Anesthesia: General

## 2021-03-09 MED ORDER — EPHEDRINE 5 MG/ML INJ
INTRAVENOUS | Status: AC
  Filled 2021-03-09: qty 5

## 2021-03-09 MED ORDER — PHENYLEPHRINE HCL (PRESSORS) 10 MG/ML IV SOLN
INTRAVENOUS | Status: DC | PRN
Start: 2021-03-09 — End: 2021-03-09
  Administered 2021-03-09 (×3): 80 ug via INTRAVENOUS

## 2021-03-09 MED ORDER — PROPOFOL 500 MG/50ML IV EMUL
INTRAVENOUS | Status: AC
  Filled 2021-03-09: qty 50

## 2021-03-09 MED ORDER — ORAL CARE MOUTH RINSE
15.0000 mL | Freq: Two times a day (BID) | OROMUCOSAL | Status: DC
  Administered 2021-03-09: 16:00:00 15 mL via OROMUCOSAL

## 2021-03-09 MED ORDER — CHLORHEXIDINE GLUCONATE 0.12 % MT SOLN
15.0000 mL | Freq: Two times a day (BID) | OROMUCOSAL | Status: DC
  Administered 2021-03-09 – 2021-03-10 (×2): 15 mL via OROMUCOSAL
  Filled 2021-03-09 (×2): qty 15

## 2021-03-09 MED ORDER — SODIUM CHLORIDE 0.9 % IV SOLN: INTRAVENOUS | Status: DC

## 2021-03-09 MED ORDER — LIDOCAINE HCL (PF) 2 % IJ SOLN
INTRAMUSCULAR | Status: AC
  Filled 2021-03-09: qty 5

## 2021-03-09 MED ORDER — IOHEXOL 9 MG/ML PO SOLN
500.0000 mL | ORAL | Status: AC
  Administered 2021-03-09 (×2): 500 mL via ORAL

## 2021-03-09 MED ORDER — IOHEXOL 300 MG/ML  SOLN
100.0000 mL | Freq: Once | INTRAMUSCULAR | Status: AC | PRN
  Administered 2021-03-09: 19:00:00 100 mL via INTRAVENOUS

## 2021-03-09 MED ORDER — PROPOFOL 500 MG/50ML IV EMUL
INTRAVENOUS | Status: DC | PRN
  Administered 2021-03-09: 175 ug/kg/min via INTRAVENOUS

## 2021-03-09 MED ORDER — PROPOFOL 10 MG/ML IV BOLUS
INTRAVENOUS | Status: DC | PRN
  Administered 2021-03-09: 70 mg via INTRAVENOUS
  Administered 2021-03-09 (×2): 10 mg via INTRAVENOUS

## 2021-03-09 MED ORDER — LIDOCAINE HCL (CARDIAC) PF 100 MG/5ML IV SOSY
PREFILLED_SYRINGE | INTRAVENOUS | Status: DC | PRN
  Administered 2021-03-09: 50 mg via INTRAVENOUS

## 2021-03-09 MED ORDER — EPHEDRINE SULFATE 50 MG/ML IJ SOLN
INTRAMUSCULAR | Status: DC | PRN
  Administered 2021-03-09 (×2): 5 mg via INTRAVENOUS
  Administered 2021-03-09: 10 mg via INTRAVENOUS
  Administered 2021-03-09: 5 mg via INTRAVENOUS

## 2021-03-09 NOTE — Hospital Course (Signed)
Tracy Gardner with a history of DM, paroxysmal A. fib on Eliquis, HTN, CKD 3a and cardio myopathy previously with reduced EF now with EF 55% July 2022. Patient presented secondary to rectal bleeding with concern for upper and lower GI bleeding. Gastroenterology was consulted and performed both upper endoscopy and colonoscopy which were significant for sigmoid colonic mass concerning for primary malignancy. General surgery and medical oncology consulted.
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
## 2021-03-09 NOTE — Anesthesia Procedure Notes (Signed)
Date/Time: 03/09/2021 9:06 AM Performed by: Johnna Acosta, CRNA Pre-anesthesia Checklist: Patient identified, Emergency Drugs available, Suction available, Patient being monitored and Timeout performed Patient Re-evaluated:Patient Re-evaluated prior to induction Oxygen Delivery Method: Nasal cannula Preoxygenation: Pre-oxygenation with 100% oxygen Induction Type: IV induction

## 2021-03-09 NOTE — Assessment & Plan Note (Addendum)
Upper endoscopy significant for normal esophagus. H. Pylori IgG, IgM, IgA ordered. Protonix discontinued.

## 2021-03-09 NOTE — Op Note (Signed)
[facility] Patient Name: Tracy Gardner Procedure Date: 03/09/2021 9:02 AM MRN: [id number] Account #: [id number] Date of Birth: [birth year] Admit Type: Outpatient Age: 55 Room: [facility] ENDO ROOM 2 Gender: Male Note Status: [employer] Instrument Name: Colonoscope [id number],Upper Endoscope [id number] Procedure:             Colonoscopy Indications:           Hematochezia Providers:             Tenita Cue B. Bonna Gains MD, MD Referring MD:          [name] (Referring MD) Medicines:             Monitored Anesthesia Care Complications:         No immediate complications. Procedure:             Pre-Anesthesia Assessment:                        - Prior to the procedure, a History and Physical was                         performed, and patient medications, allergies and                         sensitivities were reviewed. The patient's tolerance                         of previous anesthesia was reviewed.                        - The risks and benefits of the procedure and the                         sedation options and risks were discussed with the                         patient. All questions were answered and informed                         consent was obtained.                        - Patient identification and proposed procedure were                         verified prior to the procedure by the physician, the                         nurse, the anesthesiologist, the anesthetist and the                         technician. The procedure was verified in the                         pre-procedure area in the procedure room in the                         endoscopy suite.                        - Prophylactic  Antibiotics: The patient does not                         require prophylactic antibiotics.                        - ASA Grade Assessment: III - A patient with severe                         systemic disease.                        - After reviewing  the risks and benefits, the patient                         was deemed in satisfactory condition to undergo the                         procedure.                        - Monitored anesthesia care was determined to be                         medically necessary for this procedure based on review                         of the patient's medical history, medications, and                         prior anesthesia history.                        - The anesthesia plan was to use monitored anesthesia                         care (MAC).                        After obtaining informed consent, the colonoscope was                         passed under direct vision. Throughout the procedure,                         the patient's blood pressure, pulse, and oxygen                         saturations were monitored continuously. The                         Colonoscope was introduced through the anus with the                         intention of advancing to the cecum. The scope was                         advanced to the sigmoid colon before the procedure was  aborted. Medications were given. The colonoscopy was                         performed with ease. The patient tolerated the                         procedure well. The quality of the bowel preparation                         was poor. The Endoscope was introduced through the                         anus with the intention of advancing to the cecum. The                         scope was advanced to the sigmoid colon before the                         procedure was aborted. Medications were given. Findings:      The perianal and digital rectal examinations were normal.      An ulcerated partially obstructing large mass was found in the sigmoid       colon. The mass was circumferential (involving 100% of the lumen       circumference). The mass measured nine cm in length. Oozing was present.       Biopsies were taken with a  cold forceps for histology. The mass extended       from 13-22 cm from the anal verge. The adult colonoscope was not able to       be advanced through the mass, and was changed to an upper endoscope. The       upper endoscope was easily able to pass through the area. However, due       to poor prep the upper endoscope could not be advanced past 40 cm into       the colon.      A large amount of stool was found in the rectum and in the sigmoid colon.      No additional abnormalities were found on retroflexion. Impression:            - Preparation of the colon was poor.                        - Rule out malignancy, partially obstructing tumor in                         the sigmoid colon. Biopsied.                        - Stool in the rectum and in the sigmoid colon. Recommendation:        - Await pathology results.                        - Return patient to hospital [facility] for ongoing care.                        - Continue present medications.                        -  Discontinue PPI                        - Refer to a surgeon today.                        - Refer to an oncologist today.                        - Return to GI clinic in 4 weeks.                        - Return to primary care physician in 4 weeks.                        - The findings and recommendations were discussed with                         the patient.                        - The findings and recommendations were discussed with                         the patient's family. Procedure Code(s):     --- Professional ---                        [phone], 77, Colonoscopy, flexible; with biopsy, single                         or multiple Diagnosis Code(s):     --- Professional ---                        D49.0, Neoplasm of unspecified behavior of digestive                         system                        K56.690, Other partial intestinal obstruction                        K92.1, Melena (includes Hematochezia) CPT  copyright 2019 American Medical Association. All rights reserved. The codes documented in this report are preliminary and upon coder review may  be revised to meet current compliance requirements.  [name], MD Margretta Sidle B. Bonna Gains MD, MD 03/09/2021 9:50:15 AM This report has been signed electronically. Number of Addenda: 0 Note Initiated On: 03/09/2021 9:02 AM Total Procedure Duration: 0 hours 12 minutes 21 seconds  Estimated Blood Loss:  Estimated blood loss: none.      [facility]

## 2021-03-09 NOTE — Assessment & Plan Note (Addendum)
Noted on colonoscopy on 12/29. Oncology and general surgery consulted. Biopsy obtained and reported as invasive moderately differentiated adenocarcinoma. Plan for surgical resection within the next 2 weeks per General Surgery. Patient will also follow-up with outpatient oncology.

## 2021-03-09 NOTE — Assessment & Plan Note (Signed)
Stable

## 2021-03-09 NOTE — Anesthesia Preprocedure Evaluation (Signed)
Anesthesia Evaluation  Patient identified by MRN, date of birth, ID band Patient awake    Reviewed: Allergy & Precautions, NPO status , Patient's Chart, lab work & pertinent test results  History of Anesthesia Complications Negative for: history of anesthetic complications  Airway Mallampati: IV   Neck ROM: Full    Dental   Many missing teeth:   Pulmonary asthma , sleep apnea , former smoker (quit 2021),    Pulmonary exam normal breath sounds clear to auscultation       Cardiovascular hypertension, +CHF (cardiomyopathy, EF 35-40%)  Normal cardiovascular exam+ dysrhythmias (a fib on Eliquis)  Rhythm:Regular Rate:Normal  ECG 09/16/20: normal   Neuro/Psych negative neurological ROS     GI/Hepatic GI bleed   Endo/Other  diabetes, Type 2Obesity   Renal/GU Renal disease (stage III CKD; hx nephrectomy as child)     Musculoskeletal   Abdominal   Peds  Hematology  (+) Blood dyscrasia (polycythemia), ,   Anesthesia Other Findings   Reproductive/Obstetrics                             Anesthesia Physical Anesthesia Plan  ASA: 3  Anesthesia Plan: General   Post-op Pain Management:    Induction: Intravenous  PONV Risk Score and Plan: 2 and Propofol infusion, TIVA and Treatment may vary due to age or medical condition  Airway Management Planned: Natural Airway  Additional Equipment:   Intra-op Plan:   Post-operative Plan:   Informed Consent: I have reviewed the patients History and Physical, chart, labs and discussed the procedure including the risks, benefits and alternatives for the proposed anesthesia with the patient or authorized representative who has indicated his/her understanding and acceptance.       Plan Discussed with: CRNA  Anesthesia Plan Comments: (LMA/GETA backup discussed.  Patient consented for risks of anesthesia including but not limited to:  - adverse reactions  to medications - damage to eyes, teeth, lips or other oral mucosa - nerve damage due to positioning  - sore throat or hoarseness - damage to heart, brain, nerves, lungs, other parts of body or loss of life  Informed patient about role of CRNA in peri- and intra-operative care.  Patient voiced understanding.)        Anesthesia Quick Evaluation

## 2021-03-09 NOTE — Assessment & Plan Note (Deleted)
Stable

## 2021-03-09 NOTE — Assessment & Plan Note (Addendum)
Does not appear to have polycythemia vera, but rather secondary thrombocytosis. Patient undergoes phlebotomy for management and sees Dr. Rogue Bussing. Currently with anemia secondary to above.

## 2021-03-09 NOTE — Progress Notes (Signed)
PROGRESS NOTE    Tracy Gardner  WYO:[id number] DOB: Mar 31, 1965 DOA: 03/08/2021 PCP: [name], PA-C   Brief Narrative: [name] with a history of DM, paroxysmal A. fib on Eliquis, HTN, CKD 3a and cardio myopathy previously with reduced EF now with EF 55% July 2022. Patient presented secondary to rectal bleeding with concern for upper and lower GI bleeding. Gastroenterology was consulted and performed both upper endoscopy and colonoscopy which were significant for sigmoid colonic mass concerning for primary malignancy. General surgery and medical oncology consulted.   Assessment & Plan:   * Hematochezia Complicated by Eliquis use for atrial fibrillation. GI consulted and performed colonoscopy which was significant for colonic mass. Recommendation to follow-up in GI clinic in 4 weeks.  Colonic mass Noted on colonoscopy on 12/29. Concern for primary malignancy. Oncology and general surgery consulted. Biopsy obtained.  Diabetes mellitus type 2 with complications ([place])- (present on admission) Hemoglobin A1C of 6.6% which is well controlled/ patient is on Jardiance as an outpatient. -Continue SSI while inpatient  Erythrocytosis- (present on admission) Does not appear to have polycythemia vera, but rather secondary thrombocytosis. Patient undergoes phlebotomy for management and sees Dr. Rogue Bussing. Currently with anemia secondary to above.  Gastric erythema Noted on upper endoscopy.  Melena Upper endoscopy significant for normal esophagus. H. Pylori IgG, IgM, IgA ordered. Protonix discontinued.  Chronic kidney disease, stage 3a (HCC) Stable.  Essential hypertension- (present on admission) -Continue bisoprolol  Paroxysmal atrial fibrillation ([place])- (present on admission) Currently in sinus rhythm. On Eliquis as an outpatient which is held secondary to GI bleeding. -Continue amiodarone -Continue bisoprolol    DVT prophylaxis: SCDs Code Status:   Code Status:  Full Code Family Communication: Wife at bedside Disposition Plan: Discharge home pending general surgery recommendations/management   Consultants:  Gastroenterology General surgery Medical oncology  Procedures:  UPPER GI ENDOSCOPY Impression:            - Normal esophagus.                        - Erythematous mucosa in the antrum.                        - Normal duodenal bulb, second portion of the duodenum                         and examined duodenum.                        - No specimens collected. Recommendation:        - Perform a colonoscopy today.                        - Perform an H. pylori serology today.                        - Continue present medications.                        - Patient has a contact number available for                         emergencies. The signs and symptoms of potential  delayed complications were discussed with the patient.                         Return to normal activities tomorrow. Written                         discharge instructions were provided to the patient.                        - The findings and recommendations were discussed with                         the patient.                        - Return patient to hospital [facility] for ongoing care.  COLONOSCOPY Impression:            - Preparation of the colon was poor.                        - Rule out malignancy, partially obstructing tumor in                         the sigmoid colon. Biopsied.                        - Stool in the rectum and in the sigmoid colon. Recommendation:        - Await pathology results.                        - Return patient to hospital [facility] for ongoing care.                        - Continue present medications.                        - Discontinue PPI                        - Refer to a surgeon today.                        - Refer to an oncologist today.                        - Return to GI clinic in 4 weeks.                         - Return to primary care physician in 4 weeks.                        - The findings and recommendations were discussed with                         the patient.                        - The findings and recommendations were discussed with  the patient's family.   Antimicrobials: None    Subjective: Hungry. No other issues this afternoon. Aware of results from colonoscopy.  Objective: Vitals:   03/09/21 0953 03/09/21 1002 03/09/21 1008 03/09/21 1143  BP: (!) 99/51 (!) 100/54 101/64 105/70  Pulse: 64 (!) 58 (!) 57 (!) 56  Resp: 20 18 20    Temp:  (!) 97.1 F (36.2 C)  (!) 96.1 F (35.6 C)  TempSrc:  Temporal    SpO2: 95% 94% 94% 94%  Weight:      Height:        Intake/Output Summary (Last 24 hours) at 03/09/2021 1526 Last data filed at 03/09/2021 1243 Gross per 24 hour  Intake 2165.52 ml  Output 1177 ml  Net 988.52 ml   Filed Weights   03/07/21 1616  Weight: 116.1 kg    Examination:  General exam: Appears calm and comfortable  Respiratory system: Clear to auscultation. Respiratory effort normal. Cardiovascular system: S1 & S2 heard, RRR. No murmurs, rubs, gallops or clicks. Gastrointestinal system: Abdomen is distended, soft and nontender. No organomegaly or masses felt. Normal bowel sounds heard. Central nervous system: Alert and oriented. No focal neurological deficits. Musculoskeletal: No edema. No calf tenderness Skin: No cyanosis. No rashes Psychiatry: Judgement and insight appear normal. Mood & affect appropriate.     Data Reviewed: I have personally reviewed following labs and imaging studies  CBC Lab Results  Component Value Date   WBC 4.2 03/09/2021   RBC 4.11 (L) 03/09/2021   HGB 9.0 (L) 03/09/2021   HCT 30.6 (L) 03/09/2021   MCV 74.5 (L) 03/09/2021   MCH 21.9 (L) 03/09/2021   PLT 189 03/09/2021   MCHC 29.4 (L) 03/09/2021   RDW 16.0 (H) 03/09/2021   LYMPHSABS 1.0 03/07/2021   MONOABS 0.6 03/07/2021   EOSABS  0.3 03/07/2021   BASOSABS 0.1 45/80/9983     Last metabolic panel Lab Results  Component Value Date   NA 137 03/07/2021   K 4.0 03/07/2021   CL 106 03/07/2021   CO2 27 03/07/2021   BUN 13 03/07/2021   CREATININE 1.37 (H) 03/07/2021   GLUCOSE 106 (H) 03/07/2021   GFRNONAA >60 03/07/2021   GFRAA >60 01/24/2013   CALCIUM 8.6 (L) 03/07/2021   PHOS 4.9 (H) 07/08/2020   PROT 7.2 03/07/2021   ALBUMIN 3.7 03/07/2021   BILITOT 0.8 03/07/2021   ALKPHOS 69 03/07/2021   AST 20 03/07/2021   ALT 26 03/07/2021   ANIONGAP 4 (L) 03/07/2021    CBG (last 3)  Recent Labs    03/08/21 2240 03/09/21 0826 03/09/21 1144  GLUCAP 101* 104* 111*     GFR: Estimated Creatinine Clearance: 78.9 mL/min (A) (by C-G formula based on SCr of 1.37 mg/dL (H)).  Coagulation Profile: Recent Labs  Lab 03/07/21 1623  INR 1.1    Recent Results (from the past 240 hour(s))  Resp Panel by RT-PCR (Flu A&B, Covid) Nasopharyngeal Swab     Status: None   Collection Time: 03/08/21  2:40 AM   Specimen: Nasopharyngeal Swab; Nasopharyngeal(NP) swabs in vial transport medium  Result Value Ref Range Status   SARS Coronavirus 2 by RT PCR NEGATIVE NEGATIVE Final    Comment: (NOTE) SARS-CoV-2 target nucleic acids are NOT DETECTED.  The SARS-CoV-2 RNA is generally detectable in upper respiratory specimens during the acute phase of infection. The lowest concentration of SARS-CoV-2 viral copies this assay can detect is 138 copies/mL. A negative result does not preclude SARS-Cov-2 infection and should not be used  as the sole basis for treatment or other patient management decisions. A negative result may occur with  improper specimen collection/handling, submission of specimen other than nasopharyngeal swab, presence of viral mutation(s) within the areas targeted by this assay, and inadequate number of viral copies(<138 copies/mL). A negative result must be combined with clinical observations, patient history, and  epidemiological information. The expected result is Negative.  Fact Sheet for Patients:  EntrepreneurPulse.com.au  Fact Sheet for Healthcare Providers:  IncredibleEmployment.be  This test is no t yet approved or cleared by the [place] FDA and  has been authorized for detection and/or diagnosis of SARS-CoV-2 by FDA under an Emergency Use Authorization (EUA). This EUA will remain  in effect (meaning this test can be used) for the duration of the COVID-19 declaration under Section 564(b)(1) of the Act, 21 U.S.C.section 360bbb-3(b)(1), unless the authorization is terminated  or revoked sooner.       Influenza A by PCR NEGATIVE NEGATIVE Final   Influenza B by PCR NEGATIVE NEGATIVE Final    Comment: (NOTE) The Xpert Xpress SARS-CoV-2/FLU/RSV plus assay is intended as an aid in the diagnosis of influenza from Nasopharyngeal swab specimens and should not be used as a sole basis for treatment. Nasal washings and aspirates are unacceptable for Xpert Xpress SARS-CoV-2/FLU/RSV testing.  Fact Sheet for Patients: EntrepreneurPulse.com.au  Fact Sheet for Healthcare Providers: IncredibleEmployment.be  This test is not yet approved or cleared by the [place] FDA and has been authorized for detection and/or diagnosis of SARS-CoV-2 by FDA under an Emergency Use Authorization (EUA). This EUA will remain in effect (meaning this test can be used) for the duration of the COVID-19 declaration under Section 564(b)(1) of the Act, 21 U.S.C. section 360bbb-3(b)(1), unless the authorization is terminated or revoked.  Performed at [facility], [address] [place], [place] [address]         Radiology Studies: DG Chest 2 View  Result Date: 03/07/2021 CLINICAL DATA:  55 year old male with a history anemia EXAM: CHEST - 2 VIEW COMPARISON:  07/07/2020 FINDINGS: Cardiomediastinal silhouette unchanged in  size and contour. No evidence of central vascular congestion. No interlobular septal thickening. Interval removal of the defibrillator pads from the prior. Overall improved aeration compared to the prior. No pneumothorax or pleural effusion. Coarsened interstitial markings, with no confluent airspace disease. No acute displaced fracture. Degenerative changes of the spine. IMPRESSION: Favored chronic changes without definite evidence of acute cardiopulmonary disease. Electronically Signed   By: Corrie Mckusick D.O.   On: 03/07/2021 16:58        Scheduled Meds:  amiodarone  200 mg Oral Daily   bisoprolol  2.5 mg Oral Daily   chlorhexidine  15 mL Mouth Rinse BID   diphenhydrAMINE  50 mg Oral QHS   insulin aspart  0-15 Units Subcutaneous TID WC   loratadine  10 mg Oral Daily   mouth rinse  15 mL Mouth Rinse q12n4p   rosuvastatin  10 mg Oral Daily   Continuous Infusions:  sodium chloride Stopped (03/09/21 0835)     LOS: 1 day     Cordelia Poche, MD [facility] 03/09/2021, 3:26 PM  If 7PM-7AM, please contact night-coverage [email or web address]

## 2021-03-09 NOTE — Assessment & Plan Note (Addendum)
Complicated by Eliquis use for atrial fibrillation. GI consulted and performed colonoscopy which was significant for colonic mass. Recommendation to follow-up in GI clinic in 4 weeks. Patient given IV iron by hematology prior to discharge. Eliquis held on discharge pending General Surgery management.

## 2021-03-09 NOTE — Assessment & Plan Note (Addendum)
Hemoglobin A1C of 6.6% which is well controlled/ patient is on Jardiance as an outpatient.

## 2021-03-09 NOTE — Assessment & Plan Note (Addendum)
-   Continue bisoprolol 

## 2021-03-09 NOTE — Assessment & Plan Note (Signed)
Noted on upper endoscopy.

## 2021-03-09 NOTE — Anesthesia Postprocedure Evaluation (Signed)
Anesthesia Post Note  Patient: Tracy Gardner  Procedure(s) Performed: COLONOSCOPY ESOPHAGOGASTRODUODENOSCOPY (EGD)  Patient location during evaluation: PACU Anesthesia Type: General Level of consciousness: awake and alert, oriented and patient cooperative Pain management: pain level controlled Vital Signs Assessment: post-procedure vital signs reviewed and stable Respiratory status: spontaneous breathing, nonlabored ventilation and respiratory function stable Cardiovascular status: blood pressure returned to baseline and stable Postop Assessment: adequate PO intake Anesthetic complications: no   No notable events documented.   Last Vitals:  Vitals:   03/09/21 1002 03/09/21 1008  BP: (!) 100/54 101/64  Pulse: (!) 58 (!) 57  Resp: 18 20  Temp: (!) 36.2 C   SpO2: 94% 94%    Last Pain:  Vitals:   03/09/21 1008  TempSrc:   PainSc: 0-No pain                 Darrin Nipper

## 2021-03-09 NOTE — Op Note (Signed)
[facility] Patient Name: Tracy Gardner Procedure Date: 03/09/2021 9:03 AM MRN: [id number] Account #: [id number] Date of Birth: Mar 21, 1965 Admit Type: Inpatient Age: 55 Room: [facility] END[facility] ROOM 2 Gender: Male Note Status: Finalized Instrument Name: Upper Endoscope [id number] Procedure:             Upper GI endoscopy Indications:           Acute post hemorrhagic anemia, Melena Providers:             Tracy Gardner B. Tracy Gains MD, MD Referring MD:          Tracy Gardner (Referring MD) Medicines:             Monitored Anesthesia Care Complications:         No immediate complications. Procedure:             Pre-Anesthesia Assessment:                        - Prior to the procedure, a History and Physical was                         performed, and patient medications, allergies and                         sensitivities were reviewed. The patient's tolerance                         of previous anesthesia was reviewed.                        - The risks and benefits of the procedure and the                         sedation options and risks were discussed with the                         patient. All questions were answered and informed                         consent was obtained.                        - Patient identification and proposed procedure were                         verified prior to the procedure by the physician, the                         nurse, the anesthesiologist, the anesthetist and the                         technician. The procedure was verified in the                         procedure room.                        - ASA Grade Assessment: II - A patient with mild  systemic disease.                        After obtaining informed consent, the endoscope was                         passed under direct vision. Throughout the procedure,                         the patient's blood pressure, pulse, and oxygen                          saturations were monitored continuously. The Endoscope                         was introduced through the mouth, and advanced to the                         second part of duodenum. The upper GI endoscopy was                         accomplished with ease. The patient tolerated the                         procedure well. Findings:      The examined esophagus was normal.      Patchy mildly erythematous mucosa without bleeding was found in the       gastric antrum. Biopsies were not done due to recent GI Bleed and       anemia, to prevent confounding the picture with potential bleeding risks       at biopsy sites.      The exam of the stomach was otherwise normal.      The duodenal bulb, second portion of the duodenum and examined duodenum       were normal. Impression:            - Normal esophagus.                        - Erythematous mucosa in the antrum.                        - Normal duodenal bulb, second portion of the duodenum                         and examined duodenum.                        - No specimens collected. Recommendation:        - Perform a colonoscopy today.                        - Perform an H. pylori serology today.                        - Continue present medications.                        - Patient has a contact number available for  emergencies. The signs and symptoms of potential                         delayed complications were discussed with the patient.                         Return to normal activities tomorrow. Written                         discharge instructions were provided to the patient.                        - The findings and recommendations were discussed with                         the patient.                        - Return [facility] to [facility] for ongoing care. Procedure Code(s):     --- Professional ---                        [phone], Esophagogastroduodenoscopy, flexible,                          transoral; diagnostic, including collection of                         specimen(s) by brushing or washing, when performed                         (separate procedure) Diagnosis Code(s):     --- Professional ---                        K31.89, Other diseases of stomach and duodenum                        D62, Acute posthemorrhagic anemia                        K92.1, Melena (includes Hematochezia) CPT copyright 2019 American Medical Association. All rights reserved. The codes documented in this report are preliminary and upon coder review may  be revised to meet current compliance requirements.  Tracy Antigua, MD Tracy Gardner B. Tracy Gains MD, MD 03/09/2021 9:20:51 AM This report has been signed electronically. Number of Addenda: 0 Note Initiated On: 03/09/2021 9:03 AM      Tracy Gardner

## 2021-03-09 NOTE — Transfer of Care (Signed)
Immediate Anesthesia Transfer of Care Note  Patient: Delores Thelen Eustache  Procedure(s) Performed: COLONOSCOPY ESOPHAGOGASTRODUODENOSCOPY (EGD)  Patient Location: PACU  Anesthesia Type:General  Level of Consciousness: awake and patient cooperative  Airway & Oxygen Therapy: Patient Spontanous Breathing and Patient connected to nasal cannula oxygen  Post-op Assessment: Report given to RN and Post -op Vital signs reviewed and stable  Post vital signs: Reviewed and stable  Last Vitals:  Vitals Value Taken Time  BP 90/43 03/09/21 0949  Temp    Pulse 64 03/09/21 0949  Resp 24 03/09/21 0949  SpO2 92 % 03/09/21 0949    Last Pain:  Vitals:   03/09/21 0946  TempSrc:   PainSc: 0-No pain         Complications: No notable events documented.

## 2021-03-09 NOTE — Assessment & Plan Note (Addendum)
Currently in sinus rhythm. On Eliquis as an outpatient which is held secondary to GI bleeding. Continue amiodarone and bisoprolol.

## 2021-03-10 ENCOUNTER — Inpatient Hospital Stay: Payer: 59 | Attending: Internal Medicine

## 2021-03-10 ENCOUNTER — Other Ambulatory Visit: Payer: Self-pay | Admitting: Internal Medicine

## 2021-03-10 ENCOUNTER — Ambulatory Visit: Admit: 2021-03-10 | Payer: 59 | Admitting: Internal Medicine

## 2021-03-10 ENCOUNTER — Inpatient Hospital Stay: Payer: 59

## 2021-03-10 DIAGNOSIS — D500 Iron deficiency anemia secondary to blood loss (chronic): Secondary | ICD-10-CM | POA: Insufficient documentation

## 2021-03-10 DIAGNOSIS — C187 Malignant neoplasm of sigmoid colon: Secondary | ICD-10-CM

## 2021-03-10 DIAGNOSIS — K921 Melena: Secondary | ICD-10-CM | POA: Diagnosis not present

## 2021-03-10 LAB — CBC
HCT: 30.4 % — ABNORMAL LOW (ref 39.0–52.0)
Hemoglobin: 9.1 g/dL — ABNORMAL LOW (ref 13.0–17.0)
MCH: 22.4 pg — ABNORMAL LOW (ref 26.0–34.0)
MCHC: 29.9 g/dL — ABNORMAL LOW (ref 30.0–36.0)
MCV: 74.9 fL — ABNORMAL LOW (ref 80.0–100.0)
Platelets: 188 10*3/uL (ref 150–400)
RBC: 4.06 MIL/uL — ABNORMAL LOW (ref 4.22–5.81)
RDW: 16.1 % — ABNORMAL HIGH (ref 11.5–15.5)
WBC: 4.6 10*3/uL (ref 4.0–10.5)
nRBC: 0 % (ref 0.0–0.2)

## 2021-03-10 LAB — CEA: CEA: 1.2 ng/mL (ref 0.0–4.7)

## 2021-03-10 LAB — GLUCOSE, CAPILLARY
Glucose-Capillary: 97 mg/dL (ref 70–99)
Glucose-Capillary: 148 mg/dL — ABNORMAL HIGH (ref 70–99)

## 2021-03-10 LAB — SURGICAL PATHOLOGY

## 2021-03-10 LAB — H PYLORI, IGM, IGG, IGA AB
H Pylori IgG: 0.13 Index Value (ref 0.00–0.79)
H. Pylogi, Iga Abs: 11.3 units — ABNORMAL HIGH (ref 0.0–8.9)
H. Pylogi, Igm Abs: <9 units (ref 0.0–8.9)

## 2021-03-10 SURGERY — COLONOSCOPY WITH PROPOFOL
Anesthesia: General

## 2021-03-10 MED ORDER — SODIUM CHLORIDE 0.9 % IV SOLN
200.0000 mg | INTRAVENOUS | Status: DC
  Administered 2021-03-10: 10:00:00 200 mg via INTRAVENOUS
  Filled 2021-03-10: qty 200

## 2021-03-10 NOTE — Discharge Instructions (Signed)
Tracy Gardner,  You were in the hospital with GI bleeding and were found to have a colonic mass concerning for a malignancy. You will need to follow-up with the general surgeon for surgical removal of that part of your colon. Please also follow-up with your oncologist. I recommend that you stop your Eliquis; this was discussed with Dr. Donivan Scull colleague. Eliquis can be resumed when appropriate per the general surgeon.

## 2021-03-10 NOTE — Progress Notes (Signed)
Schedule the patient for IV Venofer- in week of JAN 3rd- 2 infusions; and then 1 more venofer infusion on 9th or 10th of Jan. [Surgery planned on Jan 12th]  Follow up 1st week of Feb, MD- labs- cbc/bmp; possible venofer-Dr.B

## 2021-03-10 NOTE — Progress Notes (Addendum)
[facility] SURGICAL PROGRESS NOTE (cpt [phone])  Hospital Day(s): 2.   Interval History: Patient seen and examined, no acute events or new complaints overnight. Patient reports he is doping well He denies fever, chills, nausea, emesis, or abdominal pain. His Hgb has remained stable, improved to 9.1; without any further rectal bleeding. He is tolerating PO without issues. Plan for outpatient follow up for resection planning.   Review of Systems:  Constitutional: denies fever, chills  HEENT: denies cough or congestion  Respiratory: denies any shortness of breath  Cardiovascular: denies chest pain or palpitations  Gastrointestinal: denies abdominal pain, N/V,  diarrhea Genitourinary: denies burning with urination or urinary frequency Musculoskeletal: denies pain, decreased motor or sensation  Vital signs in last 24 hours: [min-max] current  Temp:  [96.1 F (35.6 C)-98.1 F (36.7 C)] 98.1 F (36.7 C) (12/30 0457) Pulse Rate:  [56-66] 65 (12/30 0457) Resp:  [16-24] 16 (12/30 0457) BP: (86-119)/(43-70) 117/69 (12/30 0457) SpO2:  [92 %-96 %] 92 % (12/30 0457)     Height: 5\' 11"  (180.3 cm) Weight: 116.1 kg BMI (Calculated): 35.72   Intake/Output last 2 shifts:  12/29 0701 - 12/30 0700 In: 1756.3 [P.O.:240; I.V.:1516.3] Out: 2 [Blood:2]   Physical Exam:  Constitutional: alert, cooperative and no distress  HENT: normocephalic without obvious abnormality  Eyes: PERRL, EOM's grossly intact and symmetric  Respiratory: breathing non-labored at rest  Cardiovascular: regular rate and sinus rhythm  Gastrointestinal: soft, non-tender, and non-distended, no rebound/guarding Musculoskeletal: no edema or wounds, motor and sensation grossly intact, NT    Labs:  CBC Latest Ref Rng & Units 03/10/2021 03/09/2021 03/08/2021  WBC 4.0 - 10.5 K/uL 4.6 4.2 -  Hemoglobin 13.0 - 17.0 g/dL 9.1(L) 9.0(L) 9.9(L)  Hematocrit 39.0 - 52.0 % 30.4(L) 30.6(L) 31.6(L)  Platelets 150 - 400 K/uL  188 189 -   CMP Latest Ref Rng & Units 03/07/2021 08/12/2020 07/22/2020  Glucose 70 - 99 mg/dL 106(H) 81 104(H)  BUN 6 - 20 mg/dL 13 27(H) 18  Creatinine 0.61 - 1.24 mg/dL 1.37(H) 1.37(H) 1.47(H)  Sodium 135 - 145 mmol/L 137 140 142  Potassium 3.5 - 5.1 mmol/L 4.0 4.3 4.6  Chloride 98 - 111 mmol/L 106 103 103  CO2 22 - 32 mmol/L 27 28 25   Calcium 8.9 - 10.3 mg/dL 8.6(L) 9.1 8.8  Total Protein 6.5 - 8.1 g/dL 7.2 - -  Total Bilirubin 0.3 - 1.2 mg/dL 0.8 - -  Alkaline Phos 38 - 126 U/L 69 - -  AST 15 - 41 U/L 20 - -  ALT 0 - 44 U/L 26 - -     Imaging studies:   CT Chest/Abdomen/Pelvis (03/09/2021) personally reviewed without evidence of metastatic disease, and radiologist report reviewed below:  IMPRESSION: 1. No evidence of metastatic disease in the chest, abdomen, or pelvis. 2. Mild thickened appearance of the wall of the rectal vault may be related to underdistention. Underlying mass is not excluded. 3. Pulmonary edema with trace bilateral pleural effusions. 4. Solitary left kidney. 5. Sigmoid diverticulosis. 6. A 5 mm left upper lobe subpleural nodule. 7. Aortic Atherosclerosis (ICD10-I70.0).    Assessment/Plan: (ICD-10's: K17.89) 55 y.o. male with rectal bleeding found to have ulcerated partially obstructing sigmoid colon mass, likely malignant (biopsy pending), complicated by pertinent comorbidities including atrial fibrillation on Eliquis (held since 12/26).   - Thankfully he is without evidence of large bowel obstruction. As such, we will plan for resection planning as an outpatient.    - Appreciate GI assistance;  EGD and colonoscopy reviewed - Agree with oncology consultation; CEA pending; CT without evidence of metastasis  - Okay with diet for now - Monitor H&H; stable; transfuse <7              - Monitor abdominal examination; on-going bowel function - Pain control prn; antiemetics prn              - Continue to hold Eliquis; may need to hold this for some time;  defer to cardiology/medicine              - Further management per primary service   - Nothing further from general surgery perspective at this time. I have placed follow up for ~1-2 weeks to plan for resection.    All of the above findings and recommendations were discussed with the patient, patient's family (wife at bedside), and the medical team, and all of patient's and family's questions were answered to their expressed satisfaction.  -- Edison Simon, PA-C [facility] 03/10/2021, 7:21 AM [phone] M-F: 7am - 4pm

## 2021-03-10 NOTE — Progress Notes (Signed)
Pt arrived to floor in no acute distress. Vitals WDL. Alert and oriented x4. No complaints of pain or dizziness. Care plan explained and call bell within reach.

## 2021-03-10 NOTE — Progress Notes (Signed)
[name], MD [address], [place], [place], [place], [address] [id number], [place], [place], [place], [address] Phone: [phone]  Fax: [phone]   Subjective:  Patient resting in bed comfortably.  Denies any further episodes of bleeding.  Denies abdominal pain.  Wife at bedside  Objective: Exam: Vital signs in last 24 hours: Vitals:   03/10/21 0457 03/10/21 0945 03/10/21 1134 03/10/21 1140  BP: 117/69 108/60 (!) 104/55 96/83  Pulse: 65 66 65 77  Resp: 16  19 17   Temp: 98.1 F (36.7 C)  98.2 F (36.8 C) 98 F (36.7 C)  TempSrc:      SpO2: 92%  97% 93%  Weight:      Height:       Weight change:   Intake/Output Summary (Last 24 hours) at 03/10/2021 1306 Last data filed at 03/10/2021 1033 Gross per 24 hour  Intake 742.89 ml  Output --  Net 742.89 ml    Constitutional: General:   Alert,  Well-developed, well-nourished, pleasant and cooperative in NAD BP 96/83 (BP Location: Left Arm)    Pulse 77    Temp 98 F (36.7 C)    Resp 17    Ht 5\' 11"  (1.803 m)    Wt 116.1 kg    SpO2 93%    BMI 35.70 kg/m   Eyes:  Sclera clear, no icterus.   Conjunctiva pink.   Ears:  No scars, lesions or masses, Normal auditory acuity. Nose:  No deformity, discharge, or lesions. Mouth:  No deformity or lesions, oropharynx pink & moist.  Neck:  Supple; no masses, trachea midline  Respiratory: Normal respiratory effort  Gastrointestinal:  Soft, non-tender and non-distended without masses, hepatosplenomegaly or hernias noted.  No guarding or rebound tenderness.     Cardiac: No clubbing or edema.  No cyanosis. Normal posterior tibial pedal pulses noted.  Lymphatic:  No significant cervical adenopathy.  Psych:  Alert and cooperative. Normal mood and affect.  Musculoskeletal:  Head normocephalic, atraumatic. 5/5 Lower extremity strength bilaterally.  Skin: Warm. Intact without significant lesions or rashes. No jaundice.  Neurologic:  Face symmetrical, tongue  midline, Normal sensation to touch  Psych:  Alert and oriented x3, Alert and cooperative. Normal mood and affect.   Lab Results: Lab Results  Component Value Date   WBC 4.6 03/10/2021   HGB 9.1 (L) 03/10/2021   HCT 30.4 (L) 03/10/2021   MCV 74.9 (L) 03/10/2021   PLT 188 03/10/2021   Micro Results: Recent Results (from the past 240 hour(s))  Resp Panel by RT-PCR (Flu A&B, Covid) Nasopharyngeal Swab     Status: None   Collection Time: 03/08/21  2:40 AM   Specimen: Nasopharyngeal Swab; Nasopharyngeal(NP) swabs in vial transport medium  Result Value Ref Range Status   SARS Coronavirus 2 by RT PCR NEGATIVE NEGATIVE Final    Comment: (NOTE) SARS-CoV-2 target nucleic acids are NOT DETECTED.  The SARS-CoV-2 RNA is generally detectable in upper respiratory specimens during the acute phase of infection. The lowest concentration of SARS-CoV-2 viral copies this assay can detect is 138 copies/mL. A negative result does not preclude SARS-Cov-2 infection and should not be used as the sole basis for treatment or other patient management decisions. A negative result may occur with  improper specimen collection/handling, submission of specimen other than nasopharyngeal swab, presence of viral mutation(s) within the areas targeted by this assay, and inadequate number of viral copies(<138 copies/mL). A negative result must be combined with clinical observations, patient history, and epidemiological  information. The expected result is Negative.  Fact Sheet for Patients:  EntrepreneurPulse.com.au  Fact Sheet for Healthcare Providers:  IncredibleEmployment.be  This test is no t yet approved or cleared by the [place] FDA and  has been authorized for detection and/or diagnosis of SARS-CoV-2 by FDA under an Emergency Use Authorization (EUA). This EUA will remain  in effect (meaning this test can be used) for the duration of the COVID-19 declaration under  Section 564(b)(1) of the Act, 21 U.S.C.section 360bbb-3(b)(1), unless the authorization is terminated  or revoked sooner.       Influenza A by PCR NEGATIVE NEGATIVE Final   Influenza B by PCR NEGATIVE NEGATIVE Final    Comment: (NOTE) The Xpert Xpress SARS-CoV-2/FLU/RSV plus assay is intended as an aid in the diagnosis of influenza from Nasopharyngeal swab specimens and should not be used as a sole basis for treatment. Nasal washings and aspirates are unacceptable for Xpert Xpress SARS-CoV-2/FLU/RSV testing.  Fact Sheet for Patients: EntrepreneurPulse.com.au  Fact Sheet for Healthcare Providers: IncredibleEmployment.be  This test is not yet approved or cleared by the [place] FDA and has been authorized for detection and/or diagnosis of SARS-CoV-2 by FDA under an Emergency Use Authorization (EUA). This EUA will remain in effect (meaning this test can be used) for the duration of the COVID-19 declaration under Section 564(b)(1) of the Act, 21 U.S.C. section 360bbb-3(b)(1), unless the authorization is terminated or revoked.  Performed at [facility], [address] [place], [place] [address]    Studies/Results: CT CHEST ABDOMEN PELVIS W CONTRAST  Result Date: 03/09/2021 CLINICAL DATA:  Metastatic colon cancer. Sigmoid colon mass. Staging. EXAM: CT CHEST, ABDOMEN, AND PELVIS WITH CONTRAST TECHNIQUE: Multidetector CT imaging of the chest, abdomen and pelvis was performed following the standard protocol during bolus administration of intravenous contrast. CONTRAST:  129mL OMNIPAQUE IOHEXOL 300 MG/ML  SOLN COMPARISON:  Chest CT dated 07/07/2020. FINDINGS: CT CHEST FINDINGS Cardiovascular: There is no cardiomegaly or pericardial effusion. Three-vessel coronary vascular calcification. The thoracic aorta is unremarkable. The origins of the great vessels of the aortic arch and the central pulmonary arteries appear patent.  Mediastinum/Nodes: Mildly enlarged right hilar lymph node measures 11 mm in short axis similar to prior CT. Subcarinal lymph node measures 13 mm short axis similar to prior CT. A rounded anterior mediastinal lymph nodes measures 8 mm (19/2). The esophagus is grossly unremarkable. No mediastinal fluid collection. Lungs/Pleura: There is diffuse interstitial and interlobular septal prominence and hazy and ground-glass density throughout the lungs most consistent with edema. Pneumonia is not excluded. Clinical correlation is recommended. Trace bilateral pleural effusions. Minimal bibasilar atelectasis. No lobar consolidation or pneumothorax. The central airways are patent. There is a 5 mm left upper lobe subpleural nodule (69/5). Musculoskeletal: Degenerative changes of the spine. No acute osseous pathology. CT ABDOMEN PELVIS FINDINGS No intra-abdominal free air or free fluid. Hepatobiliary: Subcentimeter hypodense focus in the dome of the liver (53/2) is not characterized. The liver is otherwise unremarkable. No intrahepatic biliary dilatation. The gallbladder is unremarkable. Pancreas: Unremarkable. No pancreatic ductal dilatation or surrounding inflammatory changes. Spleen: Normal in size without focal abnormality. Adrenals/Urinary Tract: The adrenal glands are unremarkable. There is a solitary left kidney. There is no hydronephrosis. The left ureter and urinary bladder appear unremarkable. Stomach/Bowel: Mild thickened appearance of the wall of the rectal vault (113/2) which may be related to underdistention. Underlying mass is not excluded. There are scattered sigmoid diverticula without active inflammatory changes. There is no bowel obstruction or active inflammation.  The appendix is normal. Vascular/Lymphatic: Mild aortoiliac atherosclerotic disease. The IVC is unremarkable. No portal venous gas. No adenopathy. Reproductive: The prostate and seminal vesicles are grossly unremarkable. No pelvic mass. Other: None  Musculoskeletal: Degenerative changes of the spine and hips. No acute osseous pathology. IMPRESSION: 1. No evidence of metastatic disease in the chest, abdomen, or pelvis. 2. Mild thickened appearance of the wall of the rectal vault may be related to underdistention. Underlying mass is not excluded. 3. Pulmonary edema with trace bilateral pleural effusions. 4. Solitary left kidney. 5. Sigmoid diverticulosis. 6. A 5 mm left upper lobe subpleural nodule. 7. Aortic Atherosclerosis (ICD10-I70.0). Electronically Signed   By: Anner Crete M.D.   On: 03/09/2021 19:37   Medications:  Scheduled Meds:  amiodarone  200 mg Oral Daily   bisoprolol  2.5 mg Oral Daily   chlorhexidine  15 mL Mouth Rinse BID   diphenhydrAMINE  50 mg Oral QHS   insulin aspart  0-15 Units Subcutaneous TID WC   loratadine  10 mg Oral Daily   mouth rinse  15 mL Mouth Rinse q12n4p   rosuvastatin  10 mg Oral Daily   Continuous Infusions:  sodium chloride 75 mL/hr at 03/09/21 2130   iron sucrose 200 mg (03/10/21 1029)   PRN Meds:.acetaminophen **OR** acetaminophen, ondansetron **OR** ondansetron (ZOFRAN) IV   Assessment: Principal Problem:   Hematochezia Active Problems:   Erythrocytosis   Paroxysmal atrial fibrillation (HCC)   Diabetes mellitus type 2 with complications (HCC)   Essential hypertension   Chronic kidney disease, stage 3a (HCC)   Cardiomyopathy ([place])   Colonic mass   Melena   Gastric erythema    Plan: Pathologist informed me today that patient's pathology shows invasive adenocarcinoma  When I walked in the room, patient states he was already informed of the diagnosis by another physician  We discussed the importance of close follow-up with oncology and general surgery.  Patient has surgery scheduled on January 12  Patient was advised to follow-up with his primary GI.  However, patient and wife are requesting follow-up with [facility] instead at discharge.  Phone number to [facility] was given to the patient  If patient has recurrence of rectal bleeding after discharge, he was advised to call us or go to the emergency room he verbalized understanding   LOS: 2 days   [name], MD 03/10/2021, 1:06 PM

## 2021-03-10 NOTE — Progress Notes (Signed)
Tracy Gardner to be D/C'd Home per MD order.  Discussed with the patient and all questions fully answered.  VSS, Skin clean, dry and intact without evidence of skin break down, no evidence of skin tears noted. IV catheter discontinued intact. Site without signs and symptoms of complications. Dressing and pressure applied.  An After Visit Summary was printed and given to the patient.  D/c education completed with patient/family including follow up instructions, medication list, d/c activities limitations if indicated, with other d/c instructions as indicated by MD - patient able to verbalize understanding, all questions fully answered.   Patient instructed to return to ED, call 911, or call MD for any changes in condition.   Patient escorted to front entrance, and D/C home via private auto.  Tracy Gardner 03/10/2021 1:16 PM
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
## 2021-03-10 NOTE — Discharge Summary (Signed)
Physician Discharge Summary  Tracy Gardner VHQ:[id number] DOB: 1965-04-23 DOA: 03/08/2021  PCP: Tracy Rossetti, PA-C  Admit date: 03/08/2021 Discharge date: 03/10/2021  Admitted From: Home Disposition: Home  Recommendations for Outpatient Follow-up:  Follow up with PCP in 1 week Follow up with General surgery and oncology Please obtain BMP/CBC in one week Please follow up on the following pending results: None  Home Health: None Equipment/Devices: None  Discharge Condition: Stable CODE STATUS: Full code Diet recommendation: Carb modified   Brief/Interim Summary:  Admission HPI written by Tracy Masse, MD   HPI: Tracy Gardner is a 55 y.o. male with medical history significant for DM, paroxysmal A. fib on Eliquis, HTN, CKD 3a and cardio myopathy previously with reduced EF now with EF 55% July 2022, who presents to the ED at the recommendation of GI due to several episodes of rectal bleeding, sometimes red and sometimes dark.  This has been ongoing for the past several months and patient was actually currently being prepped for colonoscopy in the a.m.  Due to concern for not being advised to stop the Eliquis prior to the procedure he called his GI office who directed him to come in for evaluation.  He denies abdominal pain, denies nausea or vomiting.  He has had no prior colonoscopies.  Last Eliquis dose was 12/26    Hospital course:  * Hematochezia Complicated by Eliquis use for atrial fibrillation. GI consulted and performed colonoscopy which was significant for colonic mass. Recommendation to follow-up in GI clinic in 4 weeks. Patient given IV iron by hematology prior to discharge. Eliquis held on discharge pending General Surgery management.  Adenocarcinoma of colon ([place]) Noted on colonoscopy on 12/29. Oncology and general surgery consulted. Biopsy obtained and reported as invasive moderately differentiated adenocarcinoma. Plan for surgical resection within the next  2 weeks per General Surgery. Patient will also follow-up with outpatient oncology.  Diabetes mellitus type 2 with complications ([place])- (present on admission) Hemoglobin A1C of 6.6% which is well controlled/ patient is on Jardiance as an outpatient.  Erythrocytosis- (present on admission) Does not appear to have polycythemia vera, but rather secondary thrombocytosis. Patient undergoes phlebotomy for management and sees Dr. Rogue Gardner. Currently with anemia secondary to above.  Gastric erythema Noted on upper endoscopy.  Melena Upper endoscopy significant for normal esophagus. H. Pylori IgG, IgM, IgA ordered. Protonix discontinued.  Chronic kidney disease, stage 3a (HCC) Stable.  Essential hypertension- (present on admission) Continue bisoprolol  Paroxysmal atrial fibrillation ([place])- (present on admission) Currently in sinus rhythm. On Eliquis as an outpatient which is held secondary to GI bleeding. Continue amiodarone and bisoprolol.    Discharge Diagnoses:  Principal Problem:   Hematochezia Active Problems:   Colonic mass   Erythrocytosis   Diabetes mellitus type 2 with complications (HCC)   Paroxysmal atrial fibrillation (HCC)   Essential hypertension   Chronic kidney disease, stage 3a (HCC)   Cardiomyopathy (HCC)   Melena   Gastric erythema    Discharge Instructions  Discharge Instructions     Increase activity slowly   Complete by: As directed       Allergies as of 03/10/2021   No Known Allergies      Medication List     STOP taking these medications    Eliquis 5 MG Tabs tablet Generic drug: apixaban   polyethylene glycol-electrolytes 420 g solution Commonly known as: NuLYTELY       TAKE these medications    albuterol 108 (90 Base) MCG/ACT  inhaler Commonly known as: VENTOLIN HFA Inhale 2 puffs into the lungs every 6 (six) hours as needed for wheezing or shortness of breath.   amiodarone 200 MG tablet Commonly known as: PACERONE Take 1  tablet (200 mg total) by mouth daily. Hold for HR less then 50   bisoprolol 5 MG tablet Commonly known as: ZEBETA Take 0.5 tablets (2.5 mg total) by mouth daily.   cetirizine 10 MG tablet Commonly known as: ZYRTEC Take 10 mg by mouth daily.   diphenhydrAMINE 25 MG tablet Commonly known as: SOMINEX Take 50 mg by mouth at bedtime.   Flovent HFA 110 MCG/ACT inhaler Generic drug: fluticasone Inhale into the lungs 2 (two) times daily.   Fluarix Quadrivalent 0.5 ML injection Generic drug: influenza vac split quadrivalent PF Inject into the muscle.   furosemide 20 MG tablet Commonly known as: LASIX Take 1 tablet (20 mg total) by mouth daily. Take 10-20 mg daily   Jardiance 10 MG Tabs tablet Generic drug: empagliflozin Take 1 tablet (10 mg total) by mouth daily. What changed: Another medication with the same name was removed. Continue taking this medication, and follow the directions you see here.   multivitamin with minerals tablet Take 1 tablet by mouth daily.   rosuvastatin 10 MG tablet Commonly known as: CRESTOR Take 1 tablet (10 mg total) by mouth daily.        Follow-up Information     Tracy Gardner, [place] F, MD. Schedule an appointment as soon as possible for a visit in 1 week(s).   Specialty: General Surgery Why: hospital follow up in 1-2 weeks, sigmoid mass, schedule resection Contact information: [address] [place] [address] [phone]                No Known Allergies  Consultations: Gastroenterology General surgery Medical oncology   Procedures/Studies: DG Chest 2 View  Result Date: 03/07/2021 CLINICAL DATA:  55 year old male with a history anemia EXAM: CHEST - 2 VIEW COMPARISON:  07/07/2020 FINDINGS: Cardiomediastinal silhouette unchanged in size and contour. No evidence of central vascular congestion. No interlobular septal thickening. Interval removal of the defibrillator pads from the prior. Overall improved aeration  compared to the prior. No pneumothorax or pleural effusion. Coarsened interstitial markings, with no confluent airspace disease. No acute displaced fracture. Degenerative changes of the spine. IMPRESSION: Favored chronic changes without definite evidence of acute cardiopulmonary disease. Electronically Signed   By: Corrie Mckusick D.O.   On: 03/07/2021 16:58   CT CHEST ABDOMEN PELVIS W CONTRAST  Result Date: 03/09/2021 CLINICAL DATA:  Metastatic colon cancer. Sigmoid colon mass. Staging. EXAM: CT CHEST, ABDOMEN, AND PELVIS WITH CONTRAST TECHNIQUE: Multidetector CT imaging of the chest, abdomen and pelvis was performed following the standard protocol during bolus administration of intravenous contrast. CONTRAST:  153mL OMNIPAQUE IOHEXOL 300 MG/ML  SOLN COMPARISON:  Chest CT dated 07/07/2020. FINDINGS: CT CHEST FINDINGS Cardiovascular: There is no cardiomegaly or pericardial effusion. Three-vessel coronary vascular calcification. The thoracic aorta is unremarkable. The origins of the great vessels of the aortic arch and the central pulmonary arteries appear patent. Mediastinum/Nodes: Mildly enlarged right hilar lymph node measures 11 mm in short axis similar to prior CT. Subcarinal lymph node measures 13 mm short axis similar to prior CT. A rounded anterior mediastinal lymph nodes measures 8 mm (19/2). The esophagus is grossly unremarkable. No mediastinal fluid collection. Lungs/Pleura: There is diffuse interstitial and interlobular septal prominence and hazy and ground-glass density throughout the lungs most consistent with edema. Pneumonia is  not excluded. Clinical correlation is recommended. Trace bilateral pleural effusions. Minimal bibasilar atelectasis. No lobar consolidation or pneumothorax. The central airways are patent. There is a 5 mm left upper lobe subpleural nodule (69/5). Musculoskeletal: Degenerative changes of the spine. No acute osseous pathology. CT ABDOMEN PELVIS FINDINGS No intra-abdominal free  air or free fluid. Hepatobiliary: Subcentimeter hypodense focus in the dome of the liver (53/2) is not characterized. The liver is otherwise unremarkable. No intrahepatic biliary dilatation. The gallbladder is unremarkable. Pancreas: Unremarkable. No pancreatic ductal dilatation or surrounding inflammatory changes. Spleen: Normal in size without focal abnormality. Adrenals/Urinary Tract: The adrenal glands are unremarkable. There is a solitary left kidney. There is no hydronephrosis. The left ureter and urinary bladder appear unremarkable. Stomach/Bowel: Mild thickened appearance of the wall of the rectal vault (113/2) which may be related to underdistention. Underlying mass is not excluded. There are scattered sigmoid diverticula without active inflammatory changes. There is no bowel obstruction or active inflammation. The appendix is normal. Vascular/Lymphatic: Mild aortoiliac atherosclerotic disease. The IVC is unremarkable. No portal venous gas. No adenopathy. Reproductive: The prostate and seminal vesicles are grossly unremarkable. No pelvic mass. Other: None Musculoskeletal: Degenerative changes of the spine and hips. No acute osseous pathology. IMPRESSION: 1. No evidence of metastatic disease in the chest, abdomen, or pelvis. 2. Mild thickened appearance of the wall of the rectal vault may be related to underdistention. Underlying mass is not excluded. 3. Pulmonary edema with trace bilateral pleural effusions. 4. Solitary left kidney. 5. Sigmoid diverticulosis. 6. A 5 mm left upper lobe subpleural nodule. 7. Aortic Atherosclerosis (ICD10-I70.0). Electronically Signed   By: Anner Crete M.D.   On: 03/09/2021 19:37      Subjective: No issues this morning.  Discharge Exam: Vitals:   03/10/21 1134 03/10/21 1140  BP: (!) 104/55 96/83  Pulse: 65 77  Resp: 19 17  Temp: 98.2 F (36.8 C) 98 F (36.7 C)  SpO2: 97% 93%   Vitals:   03/10/21 0457 03/10/21 0945 03/10/21 1134 03/10/21 1140  BP:  117/69 108/60 (!) 104/55 96/83  Pulse: 65 66 65 77  Resp: 16  19 17   Temp: 98.1 F (36.7 C)  98.2 F (36.8 C) 98 F (36.7 C)  TempSrc:      SpO2: 92%  97% 93%  Weight:      Height:        General: Pt is alert, awake, not in acute distress Cardiovascular: RRR, S1/S2 +, no rubs, no gallops Respiratory: CTA bilaterally, no wheezing, no rhonchi Abdominal: Soft, NT, distended, bowel sounds + Extremities: no edema, no cyanosis    The results of significant diagnostics from this hospitalization (including imaging, microbiology, ancillary and laboratory) are listed below for reference.     Microbiology: Recent Results (from the past 240 hour(s))  Resp Panel by RT-PCR (Flu A&B, Covid) Nasopharyngeal Swab     Status: None   Collection Time: 03/08/21  2:40 AM   Specimen: Nasopharyngeal Swab; Nasopharyngeal(NP) swabs in vial transport medium  Result Value Ref Range Status   SARS Coronavirus 2 by RT PCR NEGATIVE NEGATIVE Final    Comment: (NOTE) SARS-CoV-2 target nucleic acids are NOT DETECTED.  The SARS-CoV-2 RNA is generally detectable in upper respiratory specimens during the acute phase of infection. The lowest concentration of SARS-CoV-2 viral copies this assay can detect is 138 copies/mL. A negative result does not preclude SARS-Cov-2 infection and should not be used as the sole basis for treatment or other patient management decisions. A negative result  may occur with  improper specimen collection/handling, submission of specimen other than nasopharyngeal swab, presence of viral mutation(s) within the areas targeted by this assay, and inadequate number of viral copies(<138 copies/mL). A negative result must be combined with clinical observations, patient history, and epidemiological information. The expected result is Negative.  Fact Sheet for Patients:  EntrepreneurPulse.com.au  Fact Sheet for Healthcare Providers:   IncredibleEmployment.be  This test is no t yet approved or cleared by the [place] FDA and  has been authorized for detection and/or diagnosis of SARS-CoV-2 by FDA under an Emergency Use Authorization (EUA). This EUA will remain  in effect (meaning this test can be used) for the duration of the COVID-19 declaration under Section 564(b)(1) of the Act, 21 U.S.C.section 360bbb-3(b)(1), unless the authorization is terminated  or revoked sooner.       Influenza A by PCR NEGATIVE NEGATIVE Final   Influenza B by PCR NEGATIVE NEGATIVE Final    Comment: (NOTE) The Xpert Xpress SARS-CoV-2/FLU/RSV plus assay is intended as an aid in the diagnosis of influenza from Nasopharyngeal swab specimens and should not be used as a sole basis for treatment. Nasal washings and aspirates are unacceptable for Xpert Xpress SARS-CoV-2/FLU/RSV testing.  Fact Sheet for Patients: EntrepreneurPulse.com.au  Fact Sheet for Healthcare Providers: IncredibleEmployment.be  This test is not yet approved or cleared by the [place] FDA and has been authorized for detection and/or diagnosis of SARS-CoV-2 by FDA under an Emergency Use Authorization (EUA). This EUA will remain in effect (meaning this test can be used) for the duration of the COVID-19 declaration under Section 564(b)(1) of the Act, 21 U.S.C. section 360bbb-3(b)(1), unless the authorization is terminated or revoked.  Performed at [facility], [address] [place], [place] [address]      Labs: BNP (last 3 results) Recent Labs    07/07/20 1010 03/07/21 1623  BNP 240.2* 828.0*   Basic Metabolic Panel: Recent Labs  Lab 03/07/21 1623  NA 137  K 4.0  CL 106  CO2 27  GLUCOSE 106*  BUN 13  CREATININE 1.37*  CALCIUM 8.6*   Liver Function Tests: Recent Labs  Lab 03/07/21 1623  AST 20  ALT 26  ALKPHOS 69  BILITOT 0.8  PROT 7.2  ALBUMIN 3.7   No results for  input(s): LIPASE, AMYLASE in the last 168 hours. No results for input(s): AMMONIA in the last 168 hours. CBC: Recent Labs  Lab 03/07/21 1623 03/08/21 0240 03/09/21 0643 03/10/21 0547  WBC 4.7  --  4.2 4.6  NEUTROABS 2.8  --   --   --   HGB 9.6* 9.9* 9.0* 9.1*  HCT 32.4* 31.6* 30.6* 30.4*  MCV 76.4*  --  74.5* 74.9*  PLT 210  --  189 188   Cardiac Enzymes: No results for input(s): CKTOTAL, CKMB, CKMBINDEX, TROPONINI in the last 168 hours. BNP: Invalid input(s): POCBNP CBG: Recent Labs  Lab 03/09/21 1144 03/09/21 1557 03/09/21 2117 03/10/21 0725 03/10/21 1136  GLUCAP 111* 94 121* 97 148*   D-Dimer No results for input(s): DDIMER in the last 72 hours. Hgb A1c Recent Labs    03/08/21 0240  HGBA1C 6.6*   Lipid Profile No results for input(s): CHOL, HDL, LDLCALC, TRIG, CHOLHDL, LDLDIRECT in the last 72 hours. Thyroid function studies No results for input(s): TSH, T4TOTAL, T3FREE, THYROIDAB in the last 72 hours.  Invalid input(s): FREET3 Anemia work up No results for input(s): VITAMINB12, FOLATE, FERRITIN, TIBC, IRON, RETICCTPCT in the last 72 hours. Urinalysis  Component Value Date/Time   COLORURINE Yellow 01/24/2013 1607   APPEARANCEUR Clear 01/24/2013 1607   LABSPEC 1.033 01/24/2013 1607   PHURINE 5.0 01/24/2013 1607   GLUCOSEU Negative 01/24/2013 1607   HGBUR Negative 01/24/2013 1607   BILIRUBINUR Negative 01/24/2013 1607   KETONESUR Negative 01/24/2013 1607   PROTEINUR >=500 01/24/2013 1607   NITRITE Negative 01/24/2013 1607   LEUKOCYTESUR Negative 01/24/2013 1607   Sepsis Labs Invalid input(s): PROCALCITONIN,  WBC,  LACTICIDVEN Microbiology Recent Results (from the past 240 hour(s))  Resp Panel by RT-PCR (Flu A&B, Covid) Nasopharyngeal Swab     Status: None   Collection Time: 03/08/21  2:40 AM   Specimen: Nasopharyngeal Swab; Nasopharyngeal(NP) swabs in vial transport medium  Result Value Ref Range Status   SARS Coronavirus 2 by RT PCR NEGATIVE  NEGATIVE Final    Comment: (NOTE) SARS-CoV-2 target nucleic acids are NOT DETECTED.  The SARS-CoV-2 RNA is generally detectable in upper respiratory specimens during the acute phase of infection. The lowest concentration of SARS-CoV-2 viral copies this assay can detect is 138 copies/mL. A negative result does not preclude SARS-Cov-2 infection and should not be used as the sole basis for treatment or other patient management decisions. A negative result may occur with  improper specimen collection/handling, submission of specimen other than nasopharyngeal swab, presence of viral mutation(s) within the areas targeted by this assay, and inadequate number of viral copies(<138 copies/mL). A negative result must be combined with clinical observations, patient history, and epidemiological information. The expected result is Negative.  Fact Sheet for Patients:  EntrepreneurPulse.com.au  Fact Sheet for Healthcare Providers:  IncredibleEmployment.be  This test is no t yet approved or cleared by the [place] FDA and  has been authorized for detection and/or diagnosis of SARS-CoV-2 by FDA under an Emergency Use Authorization (EUA). This EUA will remain  in effect (meaning this test can be used) for the duration of the COVID-19 declaration under Section 564(b)(1) of the Act, 21 U.S.C.section 360bbb-3(b)(1), unless the authorization is terminated  or revoked sooner.       Influenza A by PCR NEGATIVE NEGATIVE Final   Influenza B by PCR NEGATIVE NEGATIVE Final    Comment: (NOTE) The Xpert Xpress SARS-CoV-2/FLU/RSV plus assay is intended as an aid in the diagnosis of influenza from Nasopharyngeal swab specimens and should not be used as a sole basis for treatment. Nasal washings and aspirates are unacceptable for Xpert Xpress SARS-CoV-2/FLU/RSV testing.  Fact Sheet for Patients: EntrepreneurPulse.com.au  Fact Sheet for Healthcare  Providers: IncredibleEmployment.be  This test is not yet approved or cleared by the [place] FDA and has been authorized for detection and/or diagnosis of SARS-CoV-2 by FDA under an Emergency Use Authorization (EUA). This EUA will remain in effect (meaning this test can be used) for the duration of the COVID-19 declaration under Section 564(b)(1) of the Act, 21 U.S.C. section 360bbb-3(b)(1), unless the authorization is terminated or revoked.  Performed at [facility], [address], [place], [place] [address]      Time coordinating discharge: 35 minutes  SIGNED:   Cordelia Poche, MD [facility] 03/10/2021, 12:53 PM

## 2021-03-10 NOTE — Plan of Care (Signed)

## 2021-03-10 NOTE — TOC CM/SW Note (Signed)
°  Transition of Care [facility]) Screening Note   Patient Details  Name: Tracy Gardner Date of Birth: 1965/08/17   Transition of Care [facility]) CM/SW Contact:    Magnus Ivan, LCSW Phone Number: 03/10/2021, 8:30 AM    Transition of Care Department [facility]) has reviewed patient and no TOC needs have been identified at this time. We will continue to monitor patient advancement through interdisciplinary progression rounds. If new patient transition needs arise, please place a TOC consult.

## 2021-03-14 ENCOUNTER — Other Ambulatory Visit: Payer: Self-pay

## 2021-03-14 ENCOUNTER — Other Ambulatory Visit: Payer: Self-pay | Admitting: Gastroenterology

## 2021-03-14 MED ORDER — DOXYCYCLINE HYCLATE 100 MG PO CAPS
100.0000 mg | ORAL_CAPSULE | Freq: Two times a day (BID) | ORAL | 0 refills | Status: DC
  Filled 2021-03-14: qty 28, 14d supply, fill #0

## 2021-03-14 MED ORDER — OMEPRAZOLE 20 MG PO CPDR
20.0000 mg | DELAYED_RELEASE_CAPSULE | Freq: Two times a day (BID) | ORAL | 0 refills | Status: DC
  Filled 2021-03-14: qty 28, 14d supply, fill #0

## 2021-03-14 MED ORDER — METRONIDAZOLE 250 MG PO TABS
250.0000 mg | ORAL_TABLET | Freq: Four times a day (QID) | ORAL | 0 refills | Status: DC
Start: 2021-03-14 — End: 2021-03-27
  Filled 2021-03-14: qty 56, 14d supply, fill #0

## 2021-03-14 MED ORDER — BISMUTH SUBSALICYLATE 262 MG PO CHEW
524.0000 mg | CHEWABLE_TABLET | Freq: Three times a day (TID) | ORAL | 0 refills | Status: AC
  Filled 2021-03-14: qty 112, 14d supply, fill #0

## 2021-03-14 NOTE — Addendum Note (Signed)
Addended by: Vanice Sarah on: 03/14/2021 08:54 AM   Modules accepted: Orders

## 2021-03-14 NOTE — Progress Notes (Signed)
Lab orders entered

## 2021-03-15 ENCOUNTER — Telehealth: Payer: Self-pay | Admitting: Surgery

## 2021-03-15 ENCOUNTER — Other Ambulatory Visit: Payer: Self-pay

## 2021-03-15 ENCOUNTER — Inpatient Hospital Stay: Payer: 59 | Attending: Internal Medicine

## 2021-03-15 ENCOUNTER — Encounter: Payer: Self-pay | Admitting: Surgery

## 2021-03-15 ENCOUNTER — Ambulatory Visit: Payer: 59 | Admitting: Surgery

## 2021-03-15 VITALS — BP 106/60 | HR 57 | Temp 97.5°F | Resp 16

## 2021-03-15 VITALS — BP 106/62 | HR 59 | Temp 98.2°F | Ht 71.0 in | Wt 256.0 lb

## 2021-03-15 DIAGNOSIS — C187 Malignant neoplasm of sigmoid colon: Secondary | ICD-10-CM

## 2021-03-15 DIAGNOSIS — D751 Secondary polycythemia: Secondary | ICD-10-CM | POA: Insufficient documentation

## 2021-03-15 DIAGNOSIS — C189 Malignant neoplasm of colon, unspecified: Secondary | ICD-10-CM

## 2021-03-15 DIAGNOSIS — Z79899 Other long term (current) drug therapy: Secondary | ICD-10-CM | POA: Insufficient documentation

## 2021-03-15 MED ORDER — SODIUM CHLORIDE 0.9 % IV SOLN: 200.0000 mg | Freq: Once | INTRAVENOUS | Status: DC

## 2021-03-15 MED ORDER — IRON SUCROSE 20 MG/ML IV SOLN
200.0000 mg | Freq: Once | INTRAVENOUS | Status: AC
  Administered 2021-03-15: 200 mg via INTRAVENOUS
  Filled 2021-03-15: qty 10

## 2021-03-15 MED ORDER — SODIUM CHLORIDE 0.9 % IV SOLN
Freq: Once | INTRAVENOUS | Status: AC
  Filled 2021-03-15: qty 250

## 2021-03-15 MED ORDER — METRONIDAZOLE 500 MG PO TABS
ORAL_TABLET | ORAL | 0 refills | Status: DC
  Filled 2021-03-15: qty 6, 1d supply, fill #0

## 2021-03-15 MED ORDER — POLYETHYLENE GLYCOL 3350 17 GM/SCOOP PO POWD
ORAL | 0 refills | Status: DC
  Filled 2021-03-15: qty 238, fill #0

## 2021-03-15 MED ORDER — BISACODYL 5 MG PO TBEC
DELAYED_RELEASE_TABLET | ORAL | 0 refills | Status: DC
  Filled 2021-03-15: qty 4, fill #0

## 2021-03-15 MED ORDER — NEOMYCIN SULFATE 500 MG PO TABS
ORAL_TABLET | ORAL | 0 refills | Status: DC
  Filled 2021-03-15: qty 6, 1d supply, fill #0

## 2021-03-15 NOTE — Patient Outreach (Signed)
Received a hospital notification of Mr Mcgriff discharge, a [employer] patient.  I have assigned Deloria Lair, NP to call for follow up and determine if there are any Case Management needs.

## 2021-03-15 NOTE — Patient Instructions (Addendum)
You may hold your treatment for H-Pylori until a few weeks after you have your colon surgery. This is up to you.   Hold your Eliquis until told to resume this after surgery.   We have discussed removing a portion of your damaged colon through 4 small incisions today. We have scheduled this surgery for 03-23-21 at [facility] with Dr. Dahlia Byes. Please plan a hospital stay of 3-7 days for surgery and recovery time.  We will have you complete a bowel prep prior to your surgery. Please see information provided. You have also been given a (Blue) Pre-Care Sheet with more information regarding your particular surgery. Please review all information given.  Please call our office with any questions or concerns prior to your scheduled surgery.   Laparoscopic Colectomy Laparoscopic colectomy is surgery to remove part or all of the large intestine (colon). This procedure may be used to treat several conditions, including: Inflammation and infection of the colon (diverticulitis). Tumors or masses in the colon. Inflammatory bowel disease, such as Crohn disease or ulcerative colitis. Colectomy is an option when symptoms cannot be controlled with medicines. Bleeding from the colon that cannot be controlled by another method. Blockage or obstruction of the colon.  Tell a health care provider about: Any allergies you have. All medicines you are taking, including vitamins, herbs, eye drops, creams, and over-the-counter medicines. Any problems you or family members have had with anesthetic medicines. Any blood disorders you have. Any surgeries you have had. Any medical conditions you have. What are the risks? Generally, this is a safe procedure. However, problems may occur, including: Infection. Bleeding. Allergic reactions to medicines or dyes. Damage to other structures or organs. Leaking from where the colon was sewn together. Future blockage of the small intestines from scar tissue. Another surgery may be  needed to repair this. Needing to convert to an open procedure. Complications such as damage to other organs or excessive bleeding may require the surgeon to convert from a laparoscopic procedure to an open procedure. This involves making a larger incision in the abdomen.  What happens before the procedure? Staying hydrated Follow instructions from your health care provider about hydration, which may include: Up to 2 hours before the procedure - you may continue to drink clear liquids, such as water, clear fruit juice, black coffee, and plain tea.  Medicines Ask your health care provider about: Changing or stopping your regular medicines. This is especially important if you are taking diabetes medicines or blood thinners. Taking medicines such as aspirin and ibuprofen. These medicines can thin your blood. Do not take these medicines before your procedure if your health care provider instructs you not to. You may be given antibiotic medicine to clean out bacteria from your colon. Follow the directions carefully and take the medicine at the correct time. General instructions You may be prescribed an oral bowel prep to clean out your colon in preparation for the surgery: Follow instructions from your health care provider about how to do this. Do not eat or drink anything else after you have started the bowel prep, unless your health care provider tells you it is safe to do so. Do not use any products that contain nicotine or tobacco, such as cigarettes and e-cigarettes. If you need help quitting, ask your health care provider. What happens during the procedure? To reduce your risk of infection: Your health care team will wash or sanitize their hands. Your skin will be washed with soap. An IV tube will be inserted  into one of your veins to deliver fluid and medication. You will be given one of the following: A medicine to help you relax (sedative). A medicine to make you fall asleep (general  anesthetic). Small monitors will be connected to your body. They will be used to check your heart, blood pressure, and oxygen level. A breathing tube may be placed into your lungs during the procedure. A thin, flexible tube (catheter) will be placed into your bladder to drain urine. A tube may be placed through your nose and into your stomach to drain stomach fluids (nasogastric tube, or NG tube). Your abdomen will be filled with air so it expands. This gives the surgeon more room to operate and makes your organs easier to see. Several small cuts (incisions) will be made in your abdomen. A thin, lighted tube with a tiny camera on the end (laparoscope) will be put through one of the small incisions. The camera on the laparoscope will send a picture to a computer screen in the operating room. This will give the surgeon a good view inside your abdomen. Hollow tubes will be put through the other small incisions in your abdomen. The tools that are needed for the procedure will be put through these tubes. Clamps or staples will be put on both ends of the diseased part of the colon. The part of the intestine between the clamps or staples will be removed. If possible, the ends of the healthy colon that remain will be stitched (sutured) or stapled together to allow your body to pass waste (stool). Sometimes, the remaining colon cannot be stitched back together. If this is the case, a colostomy will be needed. If you need a colostomy: An opening to the outside of your body (stoma) will be made through your abdomen. The end of your colon will be brought to the opening. It will be stitched to the skin. A bag will be attached to the opening. Stool will drain into this removable bag. The colostomy may be temporary or permanent. The incisions from the colectomy will be closed with sutures or staples. The procedure may vary among health care providers and hospitals. What happens after the procedure? Your blood  pressure, heart rate, breathing rate, and blood oxygen level will be monitored until the medicines you were given have worn off. You will receive fluids through an IV tube until your bowels start to work properly. Once your bowels are working again, you will be given clear liquids first and then solid food as tolerated. You will be given medicines to control your pain and nausea, if needed. Do not drive for 24 hours if you were given a sedative. This information is not intended to replace advice given to you by your health care provider. Make sure you discuss any questions you have with your health care provider. Document Released: 05/19/2002 Document Revised: 11/28/2015 Document Reviewed: 11/28/2015 Elsevier Interactive Patient Education  2018 [employer].     Laparoscopic Colectomy, Care After This sheet gives you information about how to care for yourself after your procedure. Your health care provider may also give you more specific instructions. If you have problems or questions, contact your health care provider. What can I expect after the procedure? After your procedure, it is common to have the following: Pain in your abdomen, especially in the incision areas. You will be given medicine to control the pain. Tiredness. This is a normal part of the recovery process. Your energy level will return to normal over  the next several weeks. Changes in your bowel movements, such as constipation or needing to go more often. Talk with your health care provider about how to manage this.  Follow these instructions at home: Medicines Take over-the-counter and prescription medicines only as told by your health care provider. Do not drive or use heavy machinery while taking prescription pain medicine. Do not drink alcohol while taking prescription pain medicine. If you were prescribed an antibiotic medicine, use it as told by your health care provider. Do not stop using the antibiotic even if you  start to feel better. Incision care Follow instructions from your health care provider about how to take care of your incision areas. Make sure you: Keep your incisions clean and dry. Wash your hands with soap and water before and after applying medicine to the areas, and before and after changing your bandage (dressing). If soap and water are not available, use hand sanitizer. Change your dressing as told by your health care provider. Leave stitches (sutures), skin glue, or adhesive strips in place. These skin closures may need to stay in place for 2 weeks or longer. If adhesive strip edges start to loosen and curl up, you may trim the loose edges. Do not remove adhesive strips completely unless your health care provider tells you to do that. Do not wear tight clothing over the incisions. Tight clothing may rub and irritate the incision areas, which may cause the incisions to open. Do not take baths, swim, or use a hot tub until your health care provider approves. Ask your health care provider if you can take showers. You may only be allowed to take sponge baths for bathing. Check your incision area every day for signs of infection. Check for: More redness, swelling, or pain. More fluid or blood. Warmth. Pus or a bad smell. Activity Avoid lifting anything that is heavier than 10 lb (4.5 kg) for 2 weeks or until your health care provider says it is okay. You may resume normal activities as told by your health care provider. Ask your health care provider what activities are safe for you. Take rest breaks during the day as needed. Eating and drinking Follow instructions from your health care provider about what you can eat after surgery. To prevent or treat constipation while you are taking prescription pain medicine, your health care provider may recommend that you: Drink enough fluid to keep your urine clear or pale yellow. Take over-the-counter or prescription medicines. Eat foods that are  high in fiber, such as fresh fruits and vegetables, whole grains, and beans. Limit foods that are high in fat and processed sugars, such as fried and sweet foods. General instructions Ask your health care provider when you will need an appointment to get your sutures or staples removed. Keep all follow-up visits as told by your health care provider. This is important. Contact a health care provider if: You have more redness, swelling, or pain around your incisions. You have more fluid or blood coming from the incisions. Your incisions feel warm to the touch. You have pus or a bad smell coming from your incisions or your dressing. You have a fever. You have an incision that breaks open (edges not staying together) after sutures or staples have been removed. Get help right away if: You develop a rash. You have chest pain or difficulty breathing. You have pain or swelling in your legs. You feel light-headed or you faint. Your abdomen swells (becomes distended). You have nausea or vomiting.  You have blood in your stool (feces). This information is not intended to replace advice given to you by your health care provider. Make sure you discuss any questions you have with your health care provider. Document Released: 09/15/2004 Document Revised: 11/28/2015 Document Reviewed: 11/28/2015 Elsevier Interactive Patient Education  [employer].
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
## 2021-03-15 NOTE — Telephone Encounter (Signed)
Patient has been advised of Pre-Admission date/time, COVID Testing date and Surgery date.  Surgery Date: 03/23/21 Preadmission Testing Date: 03/17/21 (phone 8a-1p) Covid Testing Date: 03/22/21 @ 8:00 am, patient reminded to be there right at 8:00 am since this is being done the day before surgery.  Patient [facility] @ 8:00 am  Patient has been made aware to call [phone], between 1-3:00pm the day before surgery, to find out what time to arrive for surgery.

## 2021-03-15 NOTE — Patient Instructions (Signed)
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
## 2021-03-17 ENCOUNTER — Other Ambulatory Visit
Admission: RE | Admit: 2021-03-17 | Discharge: 2021-03-17 | Disposition: A | Discharge status: Home / Self Care | Payer: 59 | Source: Ambulatory Visit | Attending: Surgery | Admitting: Surgery

## 2021-03-17 ENCOUNTER — Other Ambulatory Visit: Payer: Self-pay

## 2021-03-17 ENCOUNTER — Inpatient Hospital Stay: Payer: 59

## 2021-03-17 VITALS — BP 123/63 | HR 60 | Temp 96.9°F | Resp 20

## 2021-03-17 VITALS — Ht 71.0 in | Wt 256.0 lb

## 2021-03-17 DIAGNOSIS — D751 Secondary polycythemia: Secondary | ICD-10-CM | POA: Diagnosis not present

## 2021-03-17 DIAGNOSIS — I10 Essential (primary) hypertension: Secondary | ICD-10-CM

## 2021-03-17 DIAGNOSIS — Z79899 Other long term (current) drug therapy: Secondary | ICD-10-CM | POA: Diagnosis not present

## 2021-03-17 DIAGNOSIS — K921 Melena: Secondary | ICD-10-CM

## 2021-03-17 DIAGNOSIS — D500 Iron deficiency anemia secondary to blood loss (chronic): Secondary | ICD-10-CM

## 2021-03-17 HISTORY — DX: Cardiac arrhythmia, unspecified: I49.9

## 2021-03-17 MED ORDER — SODIUM CHLORIDE 0.9 % IV SOLN: 200.0000 mg | Freq: Once | INTRAVENOUS | Status: DC

## 2021-03-17 MED ORDER — IRON SUCROSE 20 MG/ML IV SOLN
200.0000 mg | Freq: Once | INTRAVENOUS | Status: AC
  Administered 2021-03-17: 200 mg via INTRAVENOUS
  Filled 2021-03-17: qty 10

## 2021-03-17 MED ORDER — SODIUM CHLORIDE 0.9 % IV SOLN
Freq: Once | INTRAVENOUS | Status: AC
  Filled 2021-03-17: qty 250

## 2021-03-17 NOTE — Patient Instructions (Signed)

## 2021-03-17 NOTE — Patient Instructions (Addendum)
Your procedure is scheduled on: Thursday March 23, 2021. Report to Day Surgery inside [facility] 2nd floor.  To find out your arrival time please call [phone] between 1PM - 3PM on Wednesday March 22, 2021.  Remember: Instructions that are not followed completely may result in serious medical risk,  up to and including death, or upon the discretion of your surgeon and anesthesiologist your  surgery may need to be rescheduled.     _X__ 1. Do not eat food after midnight the night before your procedure.                 No chewing gum or hard candies. You may drink clear liquids up to 2 hours                 before you are scheduled to arrive for your surgery- DO not drink clear                 liquids within 2 hours of the start of your surgery.                 Clear Liquids include:  water, apple juice without pulp, clear Gatorade, G2 or                  Gatorade Zero (avoid Red/Purple/Blue), Black Coffee or Tea (Do not add                 anything to coffee or tea).  __X__2.  On the morning of surgery brush your teeth with toothpaste and water, you                may rinse your mouth with mouthwash if you wish.  Do not swallow any toothpaste or mouthwash.     _X__ 3.  No Alcohol for 24 hours before or after surgery.   _X__ 4.  Do Not Smoke or use e-cigarettes For 24 Hours Prior to Your Surgery.                 Do not use any chewable tobacco products for at least 6 hours prior to                 Surgery.  _X__  5.  Do not use any recreational drugs (marijuana, cocaine, heroin, ecstasy, MDMA or other)                For at least one week prior to your surgery.  Combination of these drugs with anesthesia                May have life threatening results.  __X__6.  Notify your doctor if there is any change in your medical condition      (cold, fever, infections).       Do not wear jewelry, make-up, hairpins, clips or nail polish. Do not wear lotions,  powders, or perfumes. You may wear deodorant. Do not shave 48 hours prior to surgery. Men may shave face and neck. Do not bring valuables to the hospital.    [facility] is not responsible for any belongings or valuables.  Contacts, dentures or bridgework may not be worn into surgery. Leave your suitcase in the car. After surgery it may be brought to your room. For patients admitted to the hospital, discharge time is determined by your treatment team.   Patients discharged the day of surgery will not be allowed to drive home.   Make arrangements for someone  to be with you for the first 24 hours of your Same Day Discharge.   __x__ Take these medicines the morning of surgery with A SIP OF WATER:    1. rosuvastatin (CRESTOR) 10 MG  2. omeprazole (PRILOSEC) 20 MG   3. amiodarone (PACERONE) 200 MG   4. bisoprolol (ZEBETA) 5 MG   5.  6.  ____ Fleet Enema (as directed)   _X___ Use CHG Soap (or wipes) as directed  ____ Use Benzoyl Peroxide Gel as instructed  __X__ Use inhalers on the day of surgery  albuterol (VENTOLIN HFA) 108 (90 Base) MCG/ACT inhaler  __X__ Stop empagliflozin (JARDIANCE) 10 MG 3 days prior to surgery (Last dose will be Sunday 03/19/21)   ____ Take 1/2 of usual insulin dose the night before surgery. No insulin the morning          of surgery.   ____ Call your PCP, cardiologist, or Pulmonologist if taking Coumadin/Plavix/aspirin and ask when to stop before your surgery.   __X__ One Week prior to surgery- Stop Anti-inflammatories such as Ibuprofen, Aleve, Advil, Motrin, meloxicam (MOBIC), diclofenac, etodolac, ketorolac, Toradol, Daypro, piroxicam, Goody's or BC powders. OK TO USE TYLENOL IF NEEDED   __X__ Stop supplements until after surgery.    __X__ Bring C-Pap to the hospital.    If you have any questions regarding your pre-procedure instructions,  Please call Pre-admit Testing at [phone]

## 2021-03-21 ENCOUNTER — Other Ambulatory Visit: Payer: 59

## 2021-03-21 ENCOUNTER — Telehealth: Payer: Self-pay | Admitting: Primary Care

## 2021-03-21 DIAGNOSIS — G4733 Obstructive sleep apnea (adult) (pediatric): Secondary | ICD-10-CM

## 2021-03-21 NOTE — Telephone Encounter (Signed)
I have placed a new order for the CPAP per Melissa. Nothing further needed.

## 2021-03-22 ENCOUNTER — Other Ambulatory Visit
Admission: RE | Admit: 2021-03-22 | Discharge: 2021-03-22 | Disposition: A | Discharge status: Home / Self Care | Payer: 59 | Source: Ambulatory Visit | Attending: Surgery | Admitting: Surgery

## 2021-03-22 ENCOUNTER — Other Ambulatory Visit: Payer: 59

## 2021-03-22 ENCOUNTER — Inpatient Hospital Stay: Payer: 59

## 2021-03-22 ENCOUNTER — Other Ambulatory Visit: Payer: Self-pay

## 2021-03-22 VITALS — BP 100/62 | HR 49 | Temp 96.9°F | Resp 16

## 2021-03-22 DIAGNOSIS — K635 Polyp of colon: Secondary | ICD-10-CM | POA: Diagnosis not present

## 2021-03-22 DIAGNOSIS — Z833 Family history of diabetes mellitus: Secondary | ICD-10-CM | POA: Diagnosis not present

## 2021-03-22 DIAGNOSIS — C189 Malignant neoplasm of colon, unspecified: Secondary | ICD-10-CM | POA: Diagnosis not present

## 2021-03-22 DIAGNOSIS — I4819 Other persistent atrial fibrillation: Secondary | ICD-10-CM | POA: Diagnosis not present

## 2021-03-22 DIAGNOSIS — D751 Secondary polycythemia: Secondary | ICD-10-CM

## 2021-03-22 DIAGNOSIS — C772 Secondary and unspecified malignant neoplasm of intra-abdominal lymph nodes: Secondary | ICD-10-CM | POA: Diagnosis not present

## 2021-03-22 DIAGNOSIS — Z905 Acquired absence of kidney: Secondary | ICD-10-CM | POA: Diagnosis not present

## 2021-03-22 DIAGNOSIS — Z01812 Encounter for preprocedural laboratory examination: Secondary | ICD-10-CM | POA: Insufficient documentation

## 2021-03-22 DIAGNOSIS — C187 Malignant neoplasm of sigmoid colon: Secondary | ICD-10-CM | POA: Diagnosis not present

## 2021-03-22 DIAGNOSIS — I129 Hypertensive chronic kidney disease with stage 1 through stage 4 chronic kidney disease, or unspecified chronic kidney disease: Secondary | ICD-10-CM | POA: Diagnosis not present

## 2021-03-22 DIAGNOSIS — R109 Unspecified abdominal pain: Secondary | ICD-10-CM | POA: Diagnosis not present

## 2021-03-22 DIAGNOSIS — E1122 Type 2 diabetes mellitus with diabetic chronic kidney disease: Secondary | ICD-10-CM | POA: Diagnosis not present

## 2021-03-22 DIAGNOSIS — I429 Cardiomyopathy, unspecified: Secondary | ICD-10-CM | POA: Diagnosis not present

## 2021-03-22 DIAGNOSIS — K621 Rectal polyp: Secondary | ICD-10-CM | POA: Diagnosis not present

## 2021-03-22 DIAGNOSIS — E785 Hyperlipidemia, unspecified: Secondary | ICD-10-CM | POA: Diagnosis present

## 2021-03-22 DIAGNOSIS — E669 Obesity, unspecified: Secondary | ICD-10-CM | POA: Diagnosis not present

## 2021-03-22 DIAGNOSIS — D127 Benign neoplasm of rectosigmoid junction: Secondary | ICD-10-CM | POA: Diagnosis not present

## 2021-03-22 DIAGNOSIS — Z6835 Body mass index (BMI) 35.0-35.9, adult: Secondary | ICD-10-CM | POA: Diagnosis not present

## 2021-03-22 DIAGNOSIS — Z87891 Personal history of nicotine dependence: Secondary | ICD-10-CM | POA: Diagnosis not present

## 2021-03-22 DIAGNOSIS — Z20822 Contact with and (suspected) exposure to covid-19: Secondary | ICD-10-CM | POA: Insufficient documentation

## 2021-03-22 DIAGNOSIS — G4733 Obstructive sleep apnea (adult) (pediatric): Secondary | ICD-10-CM | POA: Diagnosis not present

## 2021-03-22 DIAGNOSIS — Z79899 Other long term (current) drug therapy: Secondary | ICD-10-CM | POA: Diagnosis not present

## 2021-03-22 DIAGNOSIS — Z8249 Family history of ischemic heart disease and other diseases of the circulatory system: Secondary | ICD-10-CM | POA: Diagnosis not present

## 2021-03-22 DIAGNOSIS — N183 Chronic kidney disease, stage 3 unspecified: Secondary | ICD-10-CM | POA: Diagnosis not present

## 2021-03-22 DIAGNOSIS — C19 Malignant neoplasm of rectosigmoid junction: Secondary | ICD-10-CM | POA: Diagnosis not present

## 2021-03-22 LAB — TYPE AND SCREEN
ABO/RH(D): A POS
Antibody Screen: NEGATIVE

## 2021-03-22 LAB — SARS CORONAVIRUS 2 (TAT 6-24 HRS): SARS Coronavirus 2: NEGATIVE

## 2021-03-22 MED ORDER — SODIUM CHLORIDE 0.9 % IV SOLN
Freq: Once | INTRAVENOUS | Status: AC
  Filled 2021-03-22: qty 250

## 2021-03-22 MED ORDER — SODIUM CHLORIDE 0.9 % IV SOLN: 200.0000 mg | Freq: Once | INTRAVENOUS | Status: DC

## 2021-03-22 MED ORDER — IRON SUCROSE 20 MG/ML IV SOLN
200.0000 mg | Freq: Once | INTRAVENOUS | Status: AC
  Administered 2021-03-22: 200 mg via INTRAVENOUS
  Filled 2021-03-22: qty 10

## 2021-03-22 NOTE — Patient Instructions (Signed)

## 2021-03-23 ENCOUNTER — Inpatient Hospital Stay: Payer: 59 | Admitting: Anesthesiology

## 2021-03-23 ENCOUNTER — Inpatient Hospital Stay
Admission: RE | Admit: 2021-03-23 | Discharge: 2021-03-27 | DRG: 330 | Disposition: A | Discharge status: Home / Self Care | Payer: 59 | Attending: Surgery | Admitting: Surgery

## 2021-03-23 ENCOUNTER — Other Ambulatory Visit: Payer: Self-pay

## 2021-03-23 ENCOUNTER — Encounter
Admission: RE | Disposition: A | Discharge status: Home / Self Care | Payer: Self-pay | Source: Home / Self Care | Attending: Surgery

## 2021-03-23 ENCOUNTER — Encounter: Payer: Self-pay | Admitting: Surgery

## 2021-03-23 DIAGNOSIS — I429 Cardiomyopathy, unspecified: Secondary | ICD-10-CM | POA: Diagnosis present

## 2021-03-23 DIAGNOSIS — Z8249 Family history of ischemic heart disease and other diseases of the circulatory system: Secondary | ICD-10-CM

## 2021-03-23 DIAGNOSIS — E669 Obesity, unspecified: Secondary | ICD-10-CM | POA: Diagnosis present

## 2021-03-23 DIAGNOSIS — I129 Hypertensive chronic kidney disease with stage 1 through stage 4 chronic kidney disease, or unspecified chronic kidney disease: Secondary | ICD-10-CM | POA: Diagnosis present

## 2021-03-23 DIAGNOSIS — E1122 Type 2 diabetes mellitus with diabetic chronic kidney disease: Secondary | ICD-10-CM | POA: Diagnosis present

## 2021-03-23 DIAGNOSIS — Z20822 Contact with and (suspected) exposure to covid-19: Secondary | ICD-10-CM | POA: Diagnosis present

## 2021-03-23 DIAGNOSIS — D500 Iron deficiency anemia secondary to blood loss (chronic): Secondary | ICD-10-CM

## 2021-03-23 DIAGNOSIS — R109 Unspecified abdominal pain: Secondary | ICD-10-CM | POA: Diagnosis not present

## 2021-03-23 DIAGNOSIS — Z833 Family history of diabetes mellitus: Secondary | ICD-10-CM | POA: Diagnosis not present

## 2021-03-23 DIAGNOSIS — N183 Chronic kidney disease, stage 3 unspecified: Secondary | ICD-10-CM | POA: Diagnosis present

## 2021-03-23 DIAGNOSIS — R140 Abdominal distension (gaseous): Secondary | ICD-10-CM

## 2021-03-23 DIAGNOSIS — C187 Malignant neoplasm of sigmoid colon: Secondary | ICD-10-CM | POA: Diagnosis not present

## 2021-03-23 DIAGNOSIS — C189 Malignant neoplasm of colon, unspecified: Secondary | ICD-10-CM | POA: Diagnosis not present

## 2021-03-23 DIAGNOSIS — Z87891 Personal history of nicotine dependence: Secondary | ICD-10-CM

## 2021-03-23 DIAGNOSIS — D751 Secondary polycythemia: Secondary | ICD-10-CM | POA: Diagnosis present

## 2021-03-23 DIAGNOSIS — Z9049 Acquired absence of other specified parts of digestive tract: Secondary | ICD-10-CM

## 2021-03-23 DIAGNOSIS — Z905 Acquired absence of kidney: Secondary | ICD-10-CM | POA: Diagnosis not present

## 2021-03-23 DIAGNOSIS — E785 Hyperlipidemia, unspecified: Secondary | ICD-10-CM | POA: Diagnosis present

## 2021-03-23 DIAGNOSIS — K921 Melena: Secondary | ICD-10-CM

## 2021-03-23 DIAGNOSIS — Z6835 Body mass index (BMI) 35.0-35.9, adult: Secondary | ICD-10-CM

## 2021-03-23 DIAGNOSIS — I4819 Other persistent atrial fibrillation: Secondary | ICD-10-CM | POA: Diagnosis present

## 2021-03-23 DIAGNOSIS — I10 Essential (primary) hypertension: Secondary | ICD-10-CM

## 2021-03-23 HISTORY — PX: COLON RESECTION: SHX5231

## 2021-03-23 HISTORY — DX: Failed or difficult intubation, initial encounter: T88.4XXA

## 2021-03-23 LAB — BASIC METABOLIC PANEL
Anion gap: 9 (ref 5–15)
BUN: 17 mg/dL (ref 6–20)
CO2: 18 mmol/L — ABNORMAL LOW (ref 22–32)
Calcium: 7.9 mg/dL — ABNORMAL LOW (ref 8.9–10.3)
Chloride: 110 mmol/L (ref 98–111)
Creatinine, Ser: 1.64 mg/dL — ABNORMAL HIGH (ref 0.61–1.24)
GFR, Estimated: 49 mL/min — ABNORMAL LOW (ref >60)
Glucose, Bld: 205 mg/dL — ABNORMAL HIGH (ref 70–99)
Potassium: 4.9 mmol/L (ref 3.5–5.1)
Sodium: 137 mmol/L (ref 135–145)

## 2021-03-23 LAB — CBC
HCT: 35.5 % — ABNORMAL LOW (ref 39.0–52.0)
HCT: 35.1 % — ABNORMAL LOW (ref 39.0–52.0)
Hemoglobin: 10.6 g/dL — ABNORMAL LOW (ref 13.0–17.0)
Hemoglobin: 10 g/dL — ABNORMAL LOW (ref 13.0–17.0)
MCH: 23.3 pg — ABNORMAL LOW (ref 26.0–34.0)
MCH: 22.9 pg — ABNORMAL LOW (ref 26.0–34.0)
MCHC: 29.9 g/dL — ABNORMAL LOW (ref 30.0–36.0)
MCHC: 28.5 g/dL — ABNORMAL LOW (ref 30.0–36.0)
MCV: 78 fL — ABNORMAL LOW (ref 80.0–100.0)
MCV: 80.3 fL (ref 80.0–100.0)
Platelets: 141 10*3/uL — ABNORMAL LOW (ref 150–400)
Platelets: 183 10*3/uL (ref 150–400)
RBC: 4.55 MIL/uL (ref 4.22–5.81)
RBC: 4.37 MIL/uL (ref 4.22–5.81)
RDW: 19.9 % — ABNORMAL HIGH (ref 11.5–15.5)
RDW: 19.9 % — ABNORMAL HIGH (ref 11.5–15.5)
WBC: 4 10*3/uL (ref 4.0–10.5)
WBC: 12.2 10*3/uL — ABNORMAL HIGH (ref 4.0–10.5)
nRBC: 0 % (ref 0.0–0.2)
nRBC: 0 % (ref 0.0–0.2)

## 2021-03-23 LAB — GLUCOSE, CAPILLARY
Glucose-Capillary: 113 mg/dL — ABNORMAL HIGH (ref 70–99)
Glucose-Capillary: 171 mg/dL — ABNORMAL HIGH (ref 70–99)
Glucose-Capillary: 200 mg/dL — ABNORMAL HIGH (ref 70–99)

## 2021-03-23 SURGERY — COLECTOMY, SIGMOID, ROBOT-ASSISTED
Anesthesia: General

## 2021-03-23 MED ORDER — ALVIMOPAN 12 MG PO CAPS
12.0000 mg | ORAL_CAPSULE | ORAL | Status: AC
  Filled 2021-03-23: qty 1

## 2021-03-23 MED ORDER — SODIUM CHLORIDE 0.9 % IV SOLN
INTRAVENOUS | Status: AC
  Filled 2021-03-23: qty 2

## 2021-03-23 MED ORDER — DOXYLAMINE SUCCINATE (SLEEP) 25 MG PO TABS
25.0000 mg | ORAL_TABLET | Freq: Every day | ORAL | Status: DC
  Administered 2021-03-23 – 2021-03-26 (×4): 25 mg via ORAL
  Filled 2021-03-23 (×6): qty 1

## 2021-03-23 MED ORDER — CHLORHEXIDINE GLUCONATE CLOTH 2 % EX PADS: 6.0000 | MEDICATED_PAD | Freq: Once | CUTANEOUS | Status: DC

## 2021-03-23 MED ORDER — GABAPENTIN 300 MG PO CAPS
ORAL_CAPSULE | ORAL | Status: AC
  Administered 2021-03-23: 300 mg via ORAL
  Filled 2021-03-23: qty 1

## 2021-03-23 MED ORDER — BISOPROLOL FUMARATE 5 MG PO TABS
2.5000 mg | ORAL_TABLET | Freq: Every day | ORAL | Status: DC
  Filled 2021-03-23: qty 0.5

## 2021-03-23 MED ORDER — SODIUM CHLORIDE 0.9 % IV SOLN: INTRAVENOUS | Status: DC

## 2021-03-23 MED ORDER — EPHEDRINE SULFATE 50 MG/ML IJ SOLN
INTRAMUSCULAR | Status: DC | PRN
Start: 2021-03-23 — End: 2021-03-23
  Administered 2021-03-23 (×4): 10 mg via INTRAVENOUS

## 2021-03-23 MED ORDER — ONDANSETRON HCL 4 MG/2ML IJ SOLN: 4.0000 mg | Freq: Four times a day (QID) | INTRAMUSCULAR | Status: DC | PRN

## 2021-03-23 MED ORDER — MIDAZOLAM HCL 2 MG/2ML IJ SOLN
INTRAMUSCULAR | Status: DC | PRN
  Administered 2021-03-23: 2 mg via INTRAVENOUS

## 2021-03-23 MED ORDER — CALCIUM CHLORIDE 10 % IV SOLN
INTRAVENOUS | Status: DC | PRN
  Administered 2021-03-23: .5 g via INTRAVENOUS
  Administered 2021-03-23 (×2): .25 g via INTRAVENOUS

## 2021-03-23 MED ORDER — MIDAZOLAM HCL 2 MG/2ML IJ SOLN
INTRAMUSCULAR | Status: AC
  Filled 2021-03-23: qty 2

## 2021-03-23 MED ORDER — CHLORHEXIDINE GLUCONATE CLOTH 2 % EX PADS
6.0000 | MEDICATED_PAD | Freq: Every day | CUTANEOUS | Status: DC
  Administered 2021-03-26: 6 via TOPICAL

## 2021-03-23 MED ORDER — KETAMINE HCL 10 MG/ML IJ SOLN
INTRAMUSCULAR | Status: DC | PRN
  Administered 2021-03-23 (×2): 10 mg via INTRAVENOUS
  Administered 2021-03-23: 30 mg via INTRAVENOUS

## 2021-03-23 MED ORDER — INSULIN ASPART 100 UNIT/ML IJ SOLN
0.0000 [IU] | Freq: Three times a day (TID) | INTRAMUSCULAR | Status: DC
  Administered 2021-03-24: 2 [IU] via SUBCUTANEOUS
  Administered 2021-03-24: 3 [IU] via SUBCUTANEOUS
  Administered 2021-03-24 – 2021-03-25 (×4): 2 [IU] via SUBCUTANEOUS
  Filled 2021-03-23 (×5): qty 1

## 2021-03-23 MED ORDER — FENTANYL CITRATE (PF) 100 MCG/2ML IJ SOLN
INTRAMUSCULAR | Status: DC | PRN
  Administered 2021-03-23: 50 ug via INTRAVENOUS
  Administered 2021-03-23: 100 ug via INTRAVENOUS
  Administered 2021-03-23: 50 ug via INTRAVENOUS

## 2021-03-23 MED ORDER — ALBUTEROL SULFATE (2.5 MG/3ML) 0.083% IN NEBU: 3.0000 mL | INHALATION_SOLUTION | Freq: Four times a day (QID) | RESPIRATORY_TRACT | Status: DC | PRN

## 2021-03-23 MED ORDER — INSULIN ASPART 100 UNIT/ML IJ SOLN
6.0000 [IU] | Freq: Three times a day (TID) | INTRAMUSCULAR | Status: DC
  Administered 2021-03-24 (×3): 6 [IU] via SUBCUTANEOUS
  Filled 2021-03-23 (×4): qty 1

## 2021-03-23 MED ORDER — ALBUTEROL SULFATE HFA 108 (90 BASE) MCG/ACT IN AERS
INHALATION_SPRAY | RESPIRATORY_TRACT | Status: DC | PRN
Start: 2021-03-23 — End: 2021-03-23
  Administered 2021-03-23: 3 via RESPIRATORY_TRACT

## 2021-03-23 MED ORDER — SODIUM CHLORIDE 0.9 % IV SOLN
2.0000 g | Freq: Two times a day (BID) | INTRAVENOUS | Status: AC
  Administered 2021-03-24 (×2): 2 g via INTRAVENOUS
  Filled 2021-03-23 (×3): qty 2

## 2021-03-23 MED ORDER — LORATADINE 10 MG PO TABS
10.0000 mg | ORAL_TABLET | Freq: Every day | ORAL | Status: DC
  Administered 2021-03-24 – 2021-03-27 (×4): 10 mg via ORAL
  Filled 2021-03-23 (×4): qty 1

## 2021-03-23 MED ORDER — ORAL CARE MOUTH RINSE: 15.0000 mL | Freq: Once | OROMUCOSAL | Status: AC

## 2021-03-23 MED ORDER — ACETAMINOPHEN 500 MG PO TABS: 1000.0000 mg | ORAL_TABLET | ORAL | Status: AC

## 2021-03-23 MED ORDER — PROPOFOL 10 MG/ML IV BOLUS
INTRAVENOUS | Status: AC
  Filled 2021-03-23: qty 20

## 2021-03-23 MED ORDER — INSULIN ASPART 100 UNIT/ML IJ SOLN: 0.0000 [IU] | Freq: Every day | INTRAMUSCULAR | Status: DC

## 2021-03-23 MED ORDER — GABAPENTIN 300 MG PO CAPS: 300.0000 mg | ORAL_CAPSULE | ORAL | Status: AC

## 2021-03-23 MED ORDER — OXYCODONE HCL 5 MG/5ML PO SOLN: 5.0000 mg | Freq: Once | ORAL | Status: DC | PRN

## 2021-03-23 MED ORDER — KETOROLAC TROMETHAMINE 15 MG/ML IJ SOLN
15.0000 mg | Freq: Four times a day (QID) | INTRAMUSCULAR | Status: DC | PRN
  Administered 2021-03-24: 15 mg via INTRAVENOUS
  Filled 2021-03-23: qty 1

## 2021-03-23 MED ORDER — PANTOPRAZOLE SODIUM 40 MG PO TBEC
40.0000 mg | DELAYED_RELEASE_TABLET | Freq: Every day | ORAL | Status: DC
  Administered 2021-03-24 – 2021-03-27 (×4): 40 mg via ORAL
  Filled 2021-03-23 (×4): qty 1

## 2021-03-23 MED ORDER — STERILE WATER FOR INJECTION IJ SOLN
INTRAMUSCULAR | Status: DC | PRN
  Administered 2021-03-23: 2 mL via INTRAVENOUS

## 2021-03-23 MED ORDER — BUPIVACAINE LIPOSOME 1.3 % IJ SUSP
INTRAMUSCULAR | Status: AC
  Filled 2021-03-23: qty 20

## 2021-03-23 MED ORDER — CHLORHEXIDINE GLUCONATE 0.12 % MT SOLN
OROMUCOSAL | Status: AC
  Administered 2021-03-23: 15 mL via OROMUCOSAL
  Filled 2021-03-23: qty 15

## 2021-03-23 MED ORDER — MORPHINE SULFATE (PF) 2 MG/ML IV SOLN
2.0000 mg | INTRAVENOUS | Status: DC | PRN
  Filled 2021-03-23: qty 1

## 2021-03-23 MED ORDER — FENTANYL CITRATE (PF) 100 MCG/2ML IJ SOLN: 25.0000 ug | INTRAMUSCULAR | Status: DC | PRN

## 2021-03-23 MED ORDER — KETAMINE HCL 50 MG/5ML IJ SOSY
PREFILLED_SYRINGE | INTRAMUSCULAR | Status: AC
  Filled 2021-03-23: qty 5

## 2021-03-23 MED ORDER — SUGAMMADEX SODIUM 200 MG/2ML IV SOLN
INTRAVENOUS | Status: DC | PRN
  Administered 2021-03-23: 200 mg via INTRAVENOUS

## 2021-03-23 MED ORDER — FENTANYL CITRATE (PF) 100 MCG/2ML IJ SOLN
INTRAMUSCULAR | Status: AC
  Filled 2021-03-23: qty 2

## 2021-03-23 MED ORDER — CELECOXIB 200 MG PO CAPS: 200.0000 mg | ORAL_CAPSULE | ORAL | Status: AC

## 2021-03-23 MED ORDER — SODIUM CHLORIDE (PF) 0.9 % IJ SOLN
INTRAMUSCULAR | Status: DC | PRN
  Administered 2021-03-23: 100 mL

## 2021-03-23 MED ORDER — CHLORHEXIDINE GLUCONATE 0.12 % MT SOLN: 15.0000 mL | Freq: Once | OROMUCOSAL | Status: AC

## 2021-03-23 MED ORDER — ALVIMOPAN 12 MG PO CAPS: 12.0000 mg | ORAL_CAPSULE | ORAL | Status: AC

## 2021-03-23 MED ORDER — CELECOXIB 200 MG PO CAPS
ORAL_CAPSULE | ORAL | Status: AC
  Administered 2021-03-23: 200 mg via ORAL
  Filled 2021-03-23: qty 1

## 2021-03-23 MED ORDER — ACETAMINOPHEN 500 MG PO TABS
ORAL_TABLET | ORAL | Status: AC
  Administered 2021-03-23: 1000 mg via ORAL
  Filled 2021-03-23: qty 2

## 2021-03-23 MED ORDER — 0.9 % SODIUM CHLORIDE (POUR BTL) OPTIME
TOPICAL | Status: DC | PRN
  Administered 2021-03-23: 20 mL

## 2021-03-23 MED ORDER — PROPOFOL 10 MG/ML IV BOLUS
INTRAVENOUS | Status: DC | PRN
  Administered 2021-03-23: 160 mg via INTRAVENOUS

## 2021-03-23 MED ORDER — DIPHENHYDRAMINE HCL 12.5 MG/5ML PO ELIX
12.5000 mg | ORAL_SOLUTION | Freq: Four times a day (QID) | ORAL | Status: DC | PRN
  Filled 2021-03-23: qty 5

## 2021-03-23 MED ORDER — HEPARIN SODIUM (PORCINE) 5000 UNIT/ML IJ SOLN
INTRAMUSCULAR | Status: AC
  Administered 2021-03-23: 5000 [IU] via SUBCUTANEOUS
  Filled 2021-03-23: qty 1

## 2021-03-23 MED ORDER — DEXAMETHASONE SODIUM PHOSPHATE 10 MG/ML IJ SOLN
INTRAMUSCULAR | Status: DC | PRN
Start: 2021-03-23 — End: 2021-03-23
  Administered 2021-03-23: 8 mg via INTRAVENOUS

## 2021-03-23 MED ORDER — SODIUM CHLORIDE (PF) 0.9 % IJ SOLN
INTRAMUSCULAR | Status: AC
  Filled 2021-03-23: qty 50

## 2021-03-23 MED ORDER — SODIUM CHLORIDE 0.9 % IV SOLN
2.0000 g | INTRAVENOUS | Status: AC
  Administered 2021-03-23 (×2): 2 g via INTRAVENOUS

## 2021-03-23 MED ORDER — AMIODARONE HCL 200 MG PO TABS
200.0000 mg | ORAL_TABLET | Freq: Every day | ORAL | Status: DC
Start: 2021-03-24 — End: 2021-03-27
  Administered 2021-03-25 – 2021-03-26 (×2): 200 mg via ORAL
  Filled 2021-03-23 (×3): qty 1

## 2021-03-23 MED ORDER — PHENYLEPHRINE HCL-NACL 20-0.9 MG/250ML-% IV SOLN
INTRAVENOUS | Status: AC
  Filled 2021-03-23: qty 250

## 2021-03-23 MED ORDER — DIPHENHYDRAMINE HCL 50 MG/ML IJ SOLN: 12.5000 mg | Freq: Four times a day (QID) | INTRAMUSCULAR | Status: DC | PRN

## 2021-03-23 MED ORDER — SODIUM CHLORIDE 0.9 % IR SOLN
Status: DC | PRN
Start: 2021-03-23 — End: 2021-03-23
  Administered 2021-03-23: 600 mL

## 2021-03-23 MED ORDER — LIDOCAINE HCL (PF) 2 % IJ SOLN
INTRAMUSCULAR | Status: DC | PRN
  Administered 2021-03-23: 1 mg/kg/h via INTRADERMAL

## 2021-03-23 MED ORDER — OXYCODONE HCL 5 MG PO TABS
5.0000 mg | ORAL_TABLET | ORAL | Status: DC | PRN
  Administered 2021-03-24 (×3): 5 mg via ORAL
  Administered 2021-03-25: 10 mg via ORAL
  Filled 2021-03-23: qty 1
  Filled 2021-03-23: qty 2
  Filled 2021-03-23 (×2): qty 1

## 2021-03-23 MED ORDER — LIDOCAINE HCL (CARDIAC) PF 100 MG/5ML IV SOSY
PREFILLED_SYRINGE | INTRAVENOUS | Status: DC | PRN
  Administered 2021-03-23: 50 mg via INTRAVENOUS
  Administered 2021-03-23: 40 mg via INTRAVENOUS

## 2021-03-23 MED ORDER — LIDOCAINE HCL (PF) 2 % IJ SOLN
INTRAMUSCULAR | Status: AC
  Filled 2021-03-23: qty 10

## 2021-03-23 MED ORDER — CALCIUM CHLORIDE 10 % IV SOLN
INTRAVENOUS | Status: AC
  Filled 2021-03-23: qty 10

## 2021-03-23 MED ORDER — ALVIMOPAN 12 MG PO CAPS
ORAL_CAPSULE | ORAL | Status: AC
  Administered 2021-03-23: 12 mg via ORAL
  Filled 2021-03-23: qty 1

## 2021-03-23 MED ORDER — GLYCOPYRROLATE 0.2 MG/ML IJ SOLN
INTRAMUSCULAR | Status: DC | PRN
  Administered 2021-03-23 (×2): .2 mg via INTRAVENOUS

## 2021-03-23 MED ORDER — NOREPINEPHRINE 4 MG/250ML-% IV SOLN
INTRAVENOUS | Status: DC | PRN
  Administered 2021-03-23: 5 ug/min via INTRAVENOUS

## 2021-03-23 MED ORDER — STERILE WATER FOR IRRIGATION IR SOLN
Status: DC | PRN
Start: 2021-03-23 — End: 2021-03-23
  Administered 2021-03-23: 100 mL

## 2021-03-23 MED ORDER — PHENYLEPHRINE HCL (PRESSORS) 10 MG/ML IV SOLN
INTRAVENOUS | Status: DC | PRN
  Administered 2021-03-23: 100 ug via INTRAVENOUS

## 2021-03-23 MED ORDER — HEPARIN SODIUM (PORCINE) 5000 UNIT/ML IJ SOLN: 5000.0000 [IU] | Freq: Once | INTRAMUSCULAR | Status: AC

## 2021-03-23 MED ORDER — PHENYLEPHRINE HCL-NACL 20-0.9 MG/250ML-% IV SOLN
INTRAVENOUS | Status: DC | PRN
  Administered 2021-03-23: 75 ug/min via INTRAVENOUS

## 2021-03-23 MED ORDER — ENOXAPARIN SODIUM 40 MG/0.4ML IJ SOSY
40.0000 mg | PREFILLED_SYRINGE | INTRAMUSCULAR | Status: DC
  Administered 2021-03-23 – 2021-03-27 (×4): 40 mg via SUBCUTANEOUS
  Filled 2021-03-23 (×4): qty 0.4

## 2021-03-23 MED ORDER — LABETALOL HCL 5 MG/ML IV SOLN
INTRAVENOUS | Status: DC | PRN
  Administered 2021-03-23: 5 mg via INTRAVENOUS

## 2021-03-23 MED ORDER — ONDANSETRON 4 MG PO TBDP: 4.0000 mg | ORAL_TABLET | Freq: Four times a day (QID) | ORAL | Status: DC | PRN

## 2021-03-23 MED ORDER — ROCURONIUM BROMIDE 100 MG/10ML IV SOLN
INTRAVENOUS | Status: DC | PRN
  Administered 2021-03-23 (×3): 20 mg via INTRAVENOUS
  Administered 2021-03-23: 50 mg via INTRAVENOUS
  Administered 2021-03-23 (×2): 20 mg via INTRAVENOUS

## 2021-03-23 MED ORDER — OXYCODONE HCL 5 MG PO TABS: 5.0000 mg | ORAL_TABLET | Freq: Once | ORAL | Status: DC | PRN

## 2021-03-23 MED ORDER — ACETAMINOPHEN 500 MG PO TABS
1000.0000 mg | ORAL_TABLET | Freq: Four times a day (QID) | ORAL | Status: DC
  Administered 2021-03-23 – 2021-03-27 (×13): 1000 mg via ORAL
  Filled 2021-03-23 (×14): qty 2

## 2021-03-23 MED ORDER — BUPIVACAINE-EPINEPHRINE (PF) 0.25% -1:200000 IJ SOLN
INTRAMUSCULAR | Status: AC
  Filled 2021-03-23: qty 30

## 2021-03-23 MED ORDER — NOREPINEPHRINE 4 MG/250ML-% IV SOLN
INTRAVENOUS | Status: AC
  Filled 2021-03-23: qty 250

## 2021-03-23 SURGICAL SUPPLY — 108 items
ADH LQ OCL WTPRF AMP STRL LF (MISCELLANEOUS) ×1
ADH SKN CLS APL DERMABOND .7 (GAUZE/BANDAGES/DRESSINGS) ×2
ADHESIVE MASTISOL STRL (MISCELLANEOUS) ×1 IMPLANT
ALL-PURPOSE URETHRAL CATHETER ×1 IMPLANT
APL PRP STRL LF DISP 70% ISPRP (MISCELLANEOUS) ×2
APL SWBSTK 6 STRL LF DISP (MISCELLANEOUS) ×1
APPLICATOR COTTON TIP 6 STRL (MISCELLANEOUS) IMPLANT
APPLICATOR COTTON TIP 6IN STRL (MISCELLANEOUS) ×2
BAG LAPAROSCOPIC 12 15 PORT 16 (BASKET) IMPLANT
BAG RETRIEVAL 12/15 (BASKET) ×2
BULB RESERV EVAC DRAIN JP 100C (MISCELLANEOUS) ×1 IMPLANT
CANNULA REDUC XI 12-8 STAPL (CANNULA) ×1
CANNULA REDUCER 12-8 DVNC XI (CANNULA) ×1 IMPLANT
CATH ROBINSON RED A/P 18FR (CATHETERS) ×1 IMPLANT
CHLORAPREP W/TINT 26 (MISCELLANEOUS) ×3 IMPLANT
COVER MAYO STAND REUSABLE (DRAPES) ×2 IMPLANT
COVER TIP SHEARS 8 DVNC (MISCELLANEOUS) ×1 IMPLANT
COVER TIP SHEARS 8MM DA VINCI (MISCELLANEOUS) ×1
DEFOGGER SCOPE WARMER CLEARIFY (MISCELLANEOUS) ×2 IMPLANT
DERMABOND ADVANCED (GAUZE/BANDAGES/DRESSINGS) ×2
DERMABOND ADVANCED .7 DNX12 (GAUZE/BANDAGES/DRESSINGS) ×1 IMPLANT
DRAIN CHANNEL JP 19F (MISCELLANEOUS) ×1 IMPLANT
DRAIN PENROSE 0.625X18 (DRAIN) ×1 IMPLANT
DRAPE 3/4 80X56 (DRAPES) ×2 IMPLANT
DRAPE ARM DVNC X/XI (DISPOSABLE) ×4 IMPLANT
DRAPE COLUMN DVNC XI (DISPOSABLE) ×1 IMPLANT
DRAPE DA VINCI XI ARM (DISPOSABLE) ×4
DRAPE DA VINCI XI COLUMN (DISPOSABLE) ×1
DRAPE INCISE IOBAN 66X45 STRL (DRAPES) ×1 IMPLANT
DRAPE LEGGINS SURG 28X43 STRL (DRAPES) ×2 IMPLANT
DRAPE UNDER BUTTOCK W/FLU (DRAPES) ×2 IMPLANT
ELECT BLADE 6.5 EXT (BLADE) ×2 IMPLANT
ELECT CAUTERY BLADE 6.4 (BLADE) ×2 IMPLANT
ELECT REM PT RETURN 9FT ADLT (ELECTROSURGICAL) ×2
ELECTRODE REM PT RTRN 9FT ADLT (ELECTROSURGICAL) ×1 IMPLANT
GAUZE SPONGE 4X4 12PLY STRL (GAUZE/BANDAGES/DRESSINGS) ×1 IMPLANT
GEL ULTRASOUND 20GR AQUASONIC (MISCELLANEOUS) ×1 IMPLANT
GLOVE SURG ENC MOIS LTX SZ7 (GLOVE) ×6 IMPLANT
GOWN STRL REUS W/ TWL LRG LVL3 (GOWN DISPOSABLE) ×5 IMPLANT
GOWN STRL REUS W/TWL LRG LVL3 (GOWN DISPOSABLE) ×10
GRASPER LAPSCPC 5X45 DSP (INSTRUMENTS) ×2 IMPLANT
HANDLE YANKAUER SUCT BULB TIP (MISCELLANEOUS) ×2 IMPLANT
IRRIGATION STRYKERFLOW (MISCELLANEOUS) ×1 IMPLANT
IRRIGATOR STRYKERFLOW (MISCELLANEOUS) ×2
IV NS 1000ML (IV SOLUTION) ×2
IV NS 1000ML BAXH (IV SOLUTION) ×1 IMPLANT
KIT IMAGING PINPOINTPAQ (MISCELLANEOUS) ×1 IMPLANT
KIT OSTOMY 2 PC DRNBL 2.25 STR (WOUND CARE) IMPLANT
KIT OSTOMY DRAINABLE 2.25 STR (WOUND CARE) ×2
KIT PINK PAD W/HEAD ARE REST (MISCELLANEOUS) ×2
KIT PINK PAD W/HEAD ARM REST (MISCELLANEOUS) ×1 IMPLANT
L-HOOK LAP DISP 36CM (ELECTROSURGICAL) ×2
LABEL OR SOLS (LABEL) ×2 IMPLANT
LHOOK LAP DISP 36CM (ELECTROSURGICAL) IMPLANT
MANIFOLD NEPTUNE II (INSTRUMENTS) ×2 IMPLANT
NEEDLE HYPO 22GX1.5 SAFETY (NEEDLE) ×2 IMPLANT
NS IRRIG 500ML POUR BTL (IV SOLUTION) ×2 IMPLANT
OBTURATOR OPTICAL STANDARD 8MM (TROCAR) ×1
OBTURATOR OPTICAL STND 8 DVNC (TROCAR) ×1
OBTURATOR OPTICALSTD 8 DVNC (TROCAR) ×1 IMPLANT
PACK COLON CLEAN CLOSURE (MISCELLANEOUS) ×2 IMPLANT
PACK LAP CHOLECYSTECTOMY (MISCELLANEOUS) ×2 IMPLANT
PAD PREP 24X41 OB/GYN DISP (PERSONAL CARE ITEMS) ×2 IMPLANT
PENCIL ELECTRO HAND CTR (MISCELLANEOUS) ×2 IMPLANT
PORT ACCESS TROCAR AIRSEAL 5 (TROCAR) ×2 IMPLANT
RELOAD STAPLE 60 3.5 BLU DVNC (STAPLE) IMPLANT
RELOAD STAPLER 3.5X60 BLU DVNC (STAPLE) ×3 IMPLANT
SEAL CANN UNIV 5-8 DVNC XI (MISCELLANEOUS) ×3 IMPLANT
SEAL XI 5MM-8MM UNIVERSAL (MISCELLANEOUS) ×3
SEALER VESSEL DA VINCI XI (MISCELLANEOUS) ×1
SEALER VESSEL EXT DVNC XI (MISCELLANEOUS) ×1 IMPLANT
SET TRI-LUMEN FLTR TB AIRSEAL (TUBING) ×3 IMPLANT
SOL PREP PVP 2OZ (MISCELLANEOUS) ×4
SOLUTION ELECTROLUBE (MISCELLANEOUS) ×2 IMPLANT
SOLUTION PREP PVP 2OZ (MISCELLANEOUS) ×1 IMPLANT
SPONGE DRAIN TRACH 4X4 STRL 2S (GAUZE/BANDAGES/DRESSINGS) ×1 IMPLANT
SPONGE T-LAP 18X18 ~~LOC~~+RFID (SPONGE) ×5 IMPLANT
SPONGE T-LAP 4X18 ~~LOC~~+RFID (SPONGE) ×2 IMPLANT
STAPLER 60 DA VINCI SURE FORM (STAPLE) ×1
STAPLER 60 SUREFORM DVNC (STAPLE) IMPLANT
STAPLER CANNULA SEAL DVNC XI (STAPLE) ×1 IMPLANT
STAPLER CANNULA SEAL XI (STAPLE) ×1
STAPLER CIRCULAR MANUAL XL 29 (STAPLE) ×1 IMPLANT
STAPLER RELOAD 3.5X60 BLU DVNC (STAPLE) ×3
STAPLER RELOAD 3.5X60 BLUE (STAPLE) ×3
SURGILUBE 2OZ TUBE FLIPTOP (MISCELLANEOUS) ×4 IMPLANT
SUT ETH BLK MONO 3 0 FS 1 12/B (SUTURE) ×1 IMPLANT
SUT MNCRL AB 4-0 PS2 18 (SUTURE) ×2 IMPLANT
SUT PDS AB 0 CT1 27 (SUTURE) ×5 IMPLANT
SUT SILK 2 0 (SUTURE) ×6
SUT SILK 2 0 SH (SUTURE) ×3 IMPLANT
SUT SILK 2-0 30XBRD TIE 12 (SUTURE) ×1 IMPLANT
SUT VIC AB 2-0 SH 27 (SUTURE) ×2
SUT VIC AB 2-0 SH 27XBRD (SUTURE) ×1 IMPLANT
SUT VICRYL 0 AB UR-6 (SUTURE) ×2 IMPLANT
SYR 20ML LL LF (SYRINGE) ×4 IMPLANT
SYR BULB IRRIG 60ML STRL (SYRINGE) ×1 IMPLANT
SYR TOOMEY IRRIG 70ML (MISCELLANEOUS) ×2
SYRINGE TOOMEY IRRIG 70ML (MISCELLANEOUS) ×1 IMPLANT
SYS LAPSCP GELPORT 120MM (MISCELLANEOUS) ×2
SYS TROCAR 1.5-3 SLV ABD GEL (ENDOMECHANICALS) ×2
SYSTEM LAPSCP GELPORT 120MM (MISCELLANEOUS) IMPLANT
SYSTEM TROCR 1.5-3 SLV ABD GEL (ENDOMECHANICALS) ×1 IMPLANT
TAPE TRANSPORE STRL 2 31045 (GAUZE/BANDAGES/DRESSINGS) ×1 IMPLANT
TOWEL OR 17X26 4PK STRL BLUE (TOWEL DISPOSABLE) ×1 IMPLANT
TRAY FOLEY SLVR 16FR LF STAT (SET/KITS/TRAYS/PACK) ×2 IMPLANT
WATER STERILE IRR 3000ML UROMA (IV SOLUTION) ×1 IMPLANT
WATER STERILE IRR 500ML POUR (IV SOLUTION) ×2 IMPLANT

## 2021-03-23 NOTE — Interval H&P Note (Signed)
History and Physical Interval Note:  03/23/2021 10:43 AM  Tracy Gardner  has presented today for surgery, with the diagnosis of adenocarcinoma of colon.  The various methods of treatment have been discussed with the patient and family. After consideration of risks, benefits and other options for treatment, the patient has consented to  Procedure(s): XI ROBOT ASSISTED SIGMOID COLECTOMY with Edison Simon, PA-C assisting (N/A) as a surgical intervention.  The patient's history has been reviewed, patient examined, no change in status, stable for surgery.  I have reviewed the patient's chart and labs.  Questions were answered to the patient's satisfaction.     [place]
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
## 2021-03-23 NOTE — Transfer of Care (Signed)
Immediate Anesthesia Transfer of Care Note  Patient: Tracy Gardner  Procedure(s) Performed: XI ROBOT ASSISTED SIGMOID COLECTOMY with Edison Simon, PA-C assisting  Patient Location: PACU  Anesthesia Type:General  Level of Consciousness: awake, alert  and oriented  Airway & Oxygen Therapy: Patient Spontanous Breathing and Patient connected to face mask oxygen  Post-op Assessment: Report given to RN and Post -op Vital signs reviewed and stable  Post vital signs: Reviewed and stable  Last Vitals:  Vitals Value Taken Time  BP 104/55 03/23/21 1945  Temp 36.7 C 03/23/21 1945  Pulse 65 03/23/21 1955  Resp 22 03/23/21 1955  SpO2 98 % 03/23/21 1955  Vitals shown include unvalidated device data.  Last Pain:  Vitals:   03/23/21 1103  TempSrc: Tympanic  PainSc: 0-No pain      Patients Stated Pain Goal: 0 (25/74/93 5521)  Complications: No notable events documented.

## 2021-03-23 NOTE — Anesthesia Postprocedure Evaluation (Signed)
Anesthesia Post Note  Patient: Tracy Gardner  Procedure(s) Performed: XI ROBOT ASSISTED SIGMOID COLECTOMY with Edison Simon, PA-C assisting  Patient location during evaluation: PACU Anesthesia Type: General Level of consciousness: awake and alert Pain management: pain level controlled Vital Signs Assessment: post-procedure vital signs reviewed and stable Respiratory status: spontaneous breathing, nonlabored ventilation and respiratory function stable Cardiovascular status: blood pressure returned to baseline and stable Postop Assessment: no apparent nausea or vomiting Anesthetic complications: no Comments: Pt was hypotensive in pacu. Treated with IVF with response. Bedside echo with grossly normal cardiac function. IVC without significant colaspase with inspiration.   No notable events documented.   Last Vitals:  Vitals:   03/23/21 2123 03/23/21 2130  BP: (!) 101/54 (!) 100/55  Pulse: (!) 57 (!) 58  Resp: 16 (!) 24  Temp: 37.1 C   SpO2: 96% 94%    Last Pain:  Vitals:   03/23/21 2130  TempSrc:   PainSc: 0-No pain                 [place] Ouch

## 2021-03-23 NOTE — Progress Notes (Signed)
Per Dr. Wynetta Emery he was a ware of pressure being soft. Patients pressures were lower prior to surgery. Dr Wynetta Emery advised this nurse to bolus the patient 2 times with a fluid bolus of 250 so total of 500. Pressures are stable per Dr. Wynetta Emery and maps are greater than 12.

## 2021-03-23 NOTE — Anesthesia Procedure Notes (Signed)
Procedure Name: Intubation Date/Time: 03/23/2021 12:00 PM Performed by: Lerry Liner, CRNA Pre-anesthesia Checklist: Patient identified, Emergency Drugs available, Suction available and Patient being monitored Patient Re-evaluated:Patient Re-evaluated prior to induction Oxygen Delivery Method: Circle system utilized Preoxygenation: Pre-oxygenation with 100% oxygen Induction Type: IV induction Ventilation: Mask ventilation without difficulty and Oral airway inserted - appropriate to patient size Laryngoscope Size: McGraph and 4 Grade View: Grade III Tube type: Oral Tube size: 7.5 mm Number of attempts: 1 Airway Equipment and Method: Stylet, Oral airway, Video-laryngoscopy and Bougie stylet Placement Confirmation: ETT inserted through vocal cords under direct vision, positive ETCO2 and breath sounds checked- equal and bilateral Secured at: 23 cm Tube secured with: Tape Dental Injury: Teeth and Oropharynx as per pre-operative assessment  Difficulty Due To: Difficulty was anticipated, Difficult Airway- due to large tongue and Difficult Airway- due to anterior larynx Future Recommendations: Recommend- induction with short-acting agent, and alternative techniques readily available

## 2021-03-23 NOTE — Op Note (Signed)
PROCEDURES: 1. Robotic assisted Laparoscopic Ultra Low anterior resection 2. Robotic Laparoscopic takedown of splenic flexure 3. Diverting loop ileostomy   Pre-operative Diagnosis: Sigmoid Ca distal margin at 13 cms per colonoscopy  Post-operative Diagnosis: Sigmoid colon extending into the midrectum requring LAR   Surgeon: Jules Husbands MD FACS  Assistants: Otho Ket PA-C . ( required for the EEA anastomosis and for exposure, very complex case and assistance required)  Anesthesia: General endotracheal anesthesia  ASA Class: 2  Surgeon: Caroleen Hamman , MD FACS  Anesthesia: Gen. with endotracheal tube  Findings: Sigmoid mass extending to the midrectum. No evidence of gross metastatic disease Free gross distal margin Ultra low LAR Tension free anastomosis with very good perfusion and negative intraoperative leak ( two intact donuts)   Estimated Blood Loss: 100cc                Specimens: colon and rectum       Complications: none         Condition: stable  Procedure Details  The patient was seen again in the Holding Room. The benefits, complications, treatment options, and expected outcomes were discussed with the patient. The risks of bleeding, infection, recurrence of symptoms, failure to resolve symptoms, anastomotic leak, bowel injury, any of which could require further surgery were reviewed with the patient.   The patient was taken to Operating Room, identified  and the procedure verified.  A Time Out was held and the above information confirmed.  Prior to the induction of general anesthesia, antibiotic prophylaxis was administered. VTE prophylaxis was in place. General endotracheal anesthesia was then administered and tolerated well. After the induction, the abdomen was prepped with Chloraprep and draped in the sterile fashion. The patient was positioned in lithotomy position. Rectal exam revealed and confirm no evidence of rectal masses.  3.5 cm incision was created  Right lower quadrant. The abdominal cavity was entered under direct visualization and the Mini GelPort device was placed and pneumoperitoneum was obtained, no hemodynamic changes were apparent. . Three 8 mm robotic ports were placed under direct visualization and an additional 5 mm assist port was placed. Patient was positioned in steep trendelenburg and left side up. Robot was brought to the field and docked in the standard fashion. WE maintained visualization of our instruments at all times and avoided any collision between arms. I scrubbed out and went to the console. There were dense adhesions from the omentum to the  abdominal wall that where lysed in the standard fashion with the scissors.   We identified the takeoff of the inferior mesenteric artery dissected the pedicle and divided using vessel sealer  in the standard fashion.   Using the sealer we were able to divide the mesorectum and and also divided the mesentery of the descending colon we mobilized the descending colon IN a medial to lateral fashion. We preserved the ureter at all times. Pelvic dissection was performed in a standard fashion, posterior the  areolar was developed  space anterior to the sacrum.  I needed to place my hand to feel and make sure I was able to obtain a free resection margin, I replaced the mini gelport for a regular gel port and was able to feel the mass. Again the mass extended from the sigmoid to the midrectum. The dissection was difficult due to his body habitus and excess fat within the mesentery.  The mesorectum was divided with the vessel sealer. We performed a mesorectal excision since the mass was extending into  the mid rectum. We dissected the distal rectum circumferentially and We divided at the distal rectum with standard 60 mm blue load. We were just above the levators and I was able to mobilize the rectal stump scoring the peritoneum to gain more length.  The white line of Told was identified and  divided and  We were also able to mobilize the splenic flexure using scisors in the standard fashion. The mesentery of the descending colon was divided and we divided the mid descending colon with a blue load 64mm stapler. We used ICG green to make sure the distal descneding colon had good perfusion. We made sure we had adequate reach so the anastomosis could be performed tension free. The robot was undocked and  I scrubbed back in. Under direct visualization the descending colon stump was opened and measured the diameter of the bowel A 29 mm dilator was perfect size. A pursestring was used after inserting the anvil device. Mr. Freda Jackson was able to pass a 29 mm standard EEA stapler device through the anus and Under direct visualization we performed an end to end anastomosis with the EEA device. Please note the at the end of the procedure the anastomosis laid about 4 cms from the anal verge.  A leak test was performed inflating the colon with a To[place]y syringe and a rubber catheter. No evidence of leak was observed. There was also adequate hemostasis.  The specimen was removed in a bag. I placed 19 blake drain within the pelvis Given the length of the operation and multiple risk factors I considered that a loop ileostomy was the safest option.  I was able to bring a loop of distal terminal ileum via one of the robotic ports.  All the laparoscopic ports were removed and a second look showed no evidence of any bleeding or any other injuries.  Liposomal marcaine was infiltrated at all incision sites in a full thickness fashion.  We changed gloves and  close the  abdomen with two -0 PDS sutures in a running fashion to close the anterior and posterior fascia respectively. The skin incisions were closed with 4-0 Monocryl.  Dermabond was used to coat all the skin incisions. The loop ileostomy was matured and was located in the RUQ given his large panus. I matured it in a brook fashion with multiple 3-0  vicryls. Needle and laparotomy count were correct and there were no immediate complications.  Caroleen Hamman, MD, FACS

## 2021-03-24 ENCOUNTER — Encounter: Payer: Self-pay | Admitting: Internal Medicine

## 2021-03-24 LAB — CBC
HCT: 31.7 % — ABNORMAL LOW (ref 39.0–52.0)
Hemoglobin: 9.3 g/dL — ABNORMAL LOW (ref 13.0–17.0)
MCH: 23.4 pg — ABNORMAL LOW (ref 26.0–34.0)
MCHC: 29.3 g/dL — ABNORMAL LOW (ref 30.0–36.0)
MCV: 79.6 fL — ABNORMAL LOW (ref 80.0–100.0)
Platelets: 184 10*3/uL (ref 150–400)
RBC: 3.98 MIL/uL — ABNORMAL LOW (ref 4.22–5.81)
RDW: 20 % — ABNORMAL HIGH (ref 11.5–15.5)
WBC: 11.2 10*3/uL — ABNORMAL HIGH (ref 4.0–10.5)
nRBC: 0 % (ref 0.0–0.2)

## 2021-03-24 LAB — GLUCOSE, CAPILLARY
Glucose-Capillary: 171 mg/dL — ABNORMAL HIGH (ref 70–99)
Glucose-Capillary: 150 mg/dL — ABNORMAL HIGH (ref 70–99)
Glucose-Capillary: 136 mg/dL — ABNORMAL HIGH (ref 70–99)
Glucose-Capillary: 134 mg/dL — ABNORMAL HIGH (ref 70–99)

## 2021-03-24 LAB — HEMOGLOBIN A1C
Hgb A1c MFr Bld: 6.1 % — ABNORMAL HIGH (ref 4.8–5.6)
Mean Plasma Glucose: 128.37 mg/dL

## 2021-03-24 LAB — BASIC METABOLIC PANEL
Anion gap: 9 (ref 5–15)
BUN: 22 mg/dL — ABNORMAL HIGH (ref 6–20)
CO2: 19 mmol/L — ABNORMAL LOW (ref 22–32)
Calcium: 7.7 mg/dL — ABNORMAL LOW (ref 8.9–10.3)
Chloride: 107 mmol/L (ref 98–111)
Creatinine, Ser: 1.74 mg/dL — ABNORMAL HIGH (ref 0.61–1.24)
GFR, Estimated: 46 mL/min — ABNORMAL LOW (ref >60)
Glucose, Bld: 163 mg/dL — ABNORMAL HIGH (ref 70–99)
Potassium: 4.5 mmol/L (ref 3.5–5.1)
Sodium: 135 mmol/L (ref 135–145)

## 2021-03-24 MED ORDER — SIMETHICONE 80 MG PO CHEW
160.0000 mg | CHEWABLE_TABLET | Freq: Three times a day (TID) | ORAL | Status: DC | PRN
  Administered 2021-03-24 – 2021-03-26 (×3): 160 mg via ORAL
  Filled 2021-03-24 (×3): qty 2

## 2021-03-24 MED ORDER — PREGABALIN 50 MG PO CAPS
100.0000 mg | ORAL_CAPSULE | Freq: Three times a day (TID) | ORAL | Status: DC
  Administered 2021-03-24 – 2021-03-27 (×10): 100 mg via ORAL
  Filled 2021-03-24 (×10): qty 2

## 2021-03-24 MED ORDER — ALBUMIN HUMAN 25 % IV SOLN
25.0000 g | Freq: Once | INTRAVENOUS | Status: AC
  Administered 2021-03-24: 25 g via INTRAVENOUS
  Filled 2021-03-24: qty 100

## 2021-03-24 NOTE — Consult Note (Signed)
° °  [facility] CM Inpatient Consult   03/24/2021  Tracy Gardner Dec 15, 1965 [id number]  *Patient admitted to [facility] [ACO] Patient: [name]  Primary Care Provider:  [name], PA-C, [facility]   Patient has been currently assigned to a [place] [facility] for telephonic support and/or chronic disease management services. Reviewed for less than 30 days readmission.    Plan: Patient will be followed by [place] Coordinator to offer support and information in the health plan.   For additional questions or referrals please contact:   Natividad Brood, RN BSN [facility] Liaison  [phone] business mobile phone Toll free office [phone]  Fax number: [phone] [email or web address]

## 2021-03-24 NOTE — Evaluation (Signed)
Physical Therapy Evaluation Patient Details Name: Tracy Gardner MRN: [id number] DOB: 1965-05-20 Today's Date: 03/24/2021  History of Present Illness  admitted for acute hospitalization s/p robotic assisted laparoscopic ultra low anterior resection, mobilization of the splenic flexures, low EEA, and creation of diverting loop ileostomy for adenocarcinoma of the distal sigmoid colon extending to the mid rectum (03/23/21)  Clinical Impression  Patient resting in bed upon arrival to session; wife at bedside, pleasant and supportive.  Patient alert and oriented, follows commands and agreeable for OOB to chair.  Pain rated 5/10 with exertion and transitional movements; calms with static positioning and relaxation.  Bilat UE/LE strength and ROM grossly symmetrical and WFL; R > L hip flexion limited by abdominal discomfort.  Currently requiring min/mod assist for bed mobility; min assist for sit/stand, basic transfer and bed/chair transfer.  Slow and guarded in transitional movements, but stabilizes once upright.  Mod WBing bilat UEs on RW; slow and cautious steps, but no overt buckling or LOB.  Additional gait deferred at this time due to pain, fatigue with effort. Will continue to progress in subsequent sessions as medically appropriate. Would benefit from skilled PT to address above deficits and promote optimal return to PLOF.; Recommend transition to HHPT upon discharge from acute hospitalization.        Recommendations for follow up therapy are one component of a multi-disciplinary discharge planning process, led by the attending physician.  Recommendations may be updated based on patient status, additional functional criteria and insurance authorization.  Follow Up Recommendations Home health PT    Assistance Recommended at Discharge PRN  Patient can return home with the following  A little help with walking and/or transfers;A little help with bathing/dressing/bathroom    Equipment Recommendations     Recommendations for Other Services       Functional Status Assessment       Precautions / Restrictions Precautions Precautions: Fall Precaution Comments: R LQ JP drain, R LQ ostomy Restrictions Weight Bearing Restrictions: No      Mobility  Bed Mobility Overal bed mobility: Needs Assistance Bed Mobility: Supine to Sit     Supine to sit: Min assist;Mod assist     General bed mobility comments: assist for truncal elevation, modified log-rolling technique to minimize pain    Transfers Overall transfer level: Needs assistance Equipment used: Rolling walker (2 wheels) Transfers: Sit to/from Stand;Bed to chair/wheelchair/BSC Sit to Stand: Min assist Stand pivot transfers: Min assist         General transfer comment: min assist for lift off; limited tolerance for forward trunk lean due to abdominal pain/discomfort    Ambulation/Gait               General Gait Details: deferred due to pain, generalized fatigue with bed/chair transfer  Stairs            Wheelchair Mobility    Modified Rankin (Stroke Patients Only)       Balance Overall balance assessment: Needs assistance Sitting-balance support: No upper extremity supported;Feet supported Sitting balance-Leahy Scale: Good     Standing balance support: Bilateral upper extremity supported Standing balance-Leahy Scale: Fair                               Pertinent Vitals/Pain Pain Assessment: Faces Faces Pain Scale: Hurts even more Pain Location: abdomen Pain Descriptors / Indicators: Aching;Grimacing;Guarding Pain Intervention(s): Limited activity within patient's tolerance;Monitored during session;Repositioned    Home Living Family/patient  expects to be discharged to:: Private residence Living Arrangements: Spouse/significant other Available Help at Discharge: Family;Available PRN/intermittently Type of Home: House Home Access: Stairs to enter Entrance Stairs-Rails:  Psychiatric nurse of Steps: 10   Home Layout: One level Home Equipment: Conservation officer, nature (2 wheels)      Prior Function Prior Level of Function : Independent/Modified Independent             Mobility Comments: Indep with ADLs, household and community mobilization without assist device; 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  Denies fall history.       Hand Dominance        Extremity/Trunk Assessment   Upper Extremity Assessment Upper Extremity Assessment: Overall WFL for tasks assessed    Lower Extremity Assessment Lower Extremity Assessment: Generalized weakness (grossly 4/5, limited by post-op abdominal soreness)       Communication      Cognition Arousal/Alertness: Awake/alert Behavior During Therapy: WFL for tasks assessed/performed Overall Cognitive Status: Within Functional Limits for tasks assessed                                          General Comments      Exercises Other Exercises Other Exercises: Reviewed role of PT and progressive mobility; importance of OOB, positioning; abdominal bracing for assisted cough; LE seated therex. Patient/wife voiced understanding   Assessment/Plan    PT Assessment Patient needs continued PT services  PT Problem List Decreased strength;Decreased range of motion;Decreased activity tolerance;Decreased balance;Decreased mobility;Decreased coordination;Decreased knowledge of precautions;Decreased safety awareness;Decreased knowledge of use of DME;Cardiopulmonary status limiting activity;Decreased skin integrity;Pain       PT Treatment Interventions DME instruction;Gait training;Stair training;Functional mobility training;Therapeutic activities;Therapeutic exercise;Balance training;Patient/family education    PT Goals (Current goals can be found in the Care Plan section)  Acute Rehab PT Goals Patient Stated Goal: to return home PT Goal Formulation: With patient/family Time For  Goal Achievement: 04/07/21 Potential to Achieve Goals: Good    Frequency Min 2X/week     Co-evaluation               AM-PAC PT "6 Clicks" Mobility  Outcome Measure Help needed turning from your back to your side while in a flat bed without using bedrails?: None Help needed moving from lying on your back to sitting on the side of a flat bed without using bedrails?: A Lot Help needed moving to and from a bed to a chair (including a wheelchair)?: A Little Help needed standing up from a chair using your arms (e.g., wheelchair or bedside chair)?: A Little Help needed to walk in hospital room?: A Little Help needed climbing 3-5 steps with a railing? : A Lot 6 Click Score: 17    End of Session Equipment Utilized During Treatment: Gait belt Activity Tolerance: Patient tolerated treatment well Patient left: in chair;with call bell/phone within reach;with chair alarm set;with family/visitor present Nurse Communication: Mobility status PT Visit Diagnosis: Muscle weakness (generalized) (M62.81);Difficulty in walking, not elsewhere classified (R26.2);Pain    Time: 6213-0865 PT Time Calculation (min) (ACUTE ONLY): 29 min   Charges:   PT Evaluation $PT Eval Moderate Complexity: 1 Mod PT Treatments $Therapeutic Activity: 8-22 mins       Romie Keeble H. Owens Shark, PT, DPT, NCS 03/24/21, 3:53 PM [phone]

## 2021-03-24 NOTE — Consult Note (Addendum)
[place] Nurse ostomy consult note Patient receiving care in [facility] 220 Stoma type/location: RMQ ileostomy   Stomal assessment/size: 1 1/2" pink, moist, oval Peristomal assessment: intact Treatment options for stomal/peristomal skin: skin barrier wipes and barrier ring  Output: thin, dark brown Ostomy pouching: 2pc. 2 1/4" pouch Kellie Simmering # [phone]), skin wafer Kellie Simmering # [phone]), barrier ring Kellie Simmering # [phone])  Education provided: Explained role of ostomy nurse and creation of stoma  Demonstrated pouch change (cutting new skin barrier, measuring stoma, cleaning peristomal skin and stoma, use of barrier ring) Discussed bathing, diet, gas, medication use, constipation, diarrhea, dehydration  Handout given "Eating with an Ostomy" ([employer] [place].) www.ostomy.org (2022)    Discussed food blockage-handout given  Discussed treatment of peristomal skin (ostomy powder, skin barrier wipes) Provided patient with [employer] and marked items currently using Answered patient/family questions:   Spouse is a Airline pilot at [employer] and has ostomy experience and is able to help teach her husband as well.  Enrolled patient in [place] program: Yes  Welcome to [employer]! Thank you for choosing to enroll. A member of our team will be in touch shortly to explain our services, which include:  Personalized support from a [place], supplier, and insurance information Condition-specific information and connection to community resources If you have questions now, or in the future, please call us at [phone].    WOC will follow and see Monday if the patient is not discharged before then.  Cathlean Marseilles Tamala Julian, MSN, RN, [place], Lysle Pearl, [facility] Wound Treatment Associate Pager [phone]

## 2021-03-24 NOTE — TOC Progression Note (Deleted)
Transition of Care [facility]) - Progression Note    Patient Details  Name: Tracy Gardner MRN: [id number] Date of Birth: 16-Aug-1965  Transition of Care [facility]) CM/SW Contact  Beverly Sessions, RN Phone Number: 03/24/2021, 2:31 PM  Clinical Narrative:     PT and OT recommending SNF Daughter agreeable to bed search,  will talk to the patient about it today when she visits.   Obtained PASRR Fl2 sent for signature Bed search initiate   Expected Discharge Plan: Home/Self Care Barriers to Discharge: Continued Medical Work up  Expected Discharge Plan and Services Expected Discharge Plan: Home/Self Care   Discharge Planning Services: CM Consult                                           Social Determinants of Health (SDOH) Interventions    Readmission Risk Interventions No flowsheet data found.

## 2021-03-24 NOTE — Progress Notes (Signed)
[place] Hospital Day(s): 1.   Post op day(s): 1 Day Post-Op.   Interval History:  Patient seen and examined No acute events or new complaints overnight.  Patient reports he is feeling quite sore this morning but pain medications are helping No nausea or emesis Labs this morning are pending Reviewed immediate post operative labs; WBC 12.2K (likely reactive); Hgb stable at 10.0, sCr - 1.64 (slghtly above baseline) UO - 400 ccs; foley in place Surgical drain with 200 ccs out overnight; serosanguinous He is on carb modified diet  Vital signs in last 24 hours: [min-max] current  Temp:  [97.3 F (36.3 C)-98.7 F (37.1 C)] 97.5 F (36.4 C) (01/13 0305) Pulse Rate:  [55-67] 61 (01/13 0510) Resp:  [0-24] 20 (01/13 0305) BP: (84-108)/(48-69) 97/55 (01/13 0510) SpO2:  [89 %-98 %] 94 % (01/13 0305) Weight:  [116.1 kg] 116.1 kg (01/12 1103)     Height: 5\' 11"  (180.3 cm) Weight: 116.1 kg BMI (Calculated): 35.71   Intake/Output last 2 shifts:  01/12 0701 - 01/13 0700 In: 4634.8 [P.O.:240; I.V.:4094.8; IV Piggyback:300] Out: 650 [Urine:400; Drains:200; Blood:50]   Physical Exam:  Constitutional: alert, cooperative and no distress  Respiratory: breathing non-labored at rest; on [place] Cardiovascular: regular rate and sinus rhythm  Gastrointestinal: Soft, expected incisional soreness, non-distended, no rebound/guarding. Surgical drain in RLQ with serosanguinous output. Ileostomy in RUQ; no gas or stool in bag Integumentary: Laparoscopic incisions are CDI; no erythema   Labs:  CBC Latest Ref Rng & Units 03/23/2021 03/23/2021 03/10/2021  WBC 4.0 - 10.5 K/uL 12.2(H) 4.0 4.6  Hemoglobin 13.0 - 17.0 g/dL 10.0(L) 10.6(L) 9.1(L)  Hematocrit 39.0 - 52.0 % 35.1(L) 35.5(L) 30.4(L)  Platelets 150 - 400 K/uL 183 141(L) 188   CMP Latest Ref Rng & Units 03/23/2021 03/07/2021 08/12/2020  Glucose 70 - 99 mg/dL 205(H) 106(H) 81  BUN 6 - 20 mg/dL 17 13 27(H)   Creatinine 0.61 - 1.24 mg/dL 1.64(H) 1.37(H) 1.37(H)  Sodium 135 - 145 mmol/L 137 137 140  Potassium 3.5 - 5.1 mmol/L 4.9 4.0 4.3  Chloride 98 - 111 mmol/L 110 106 103  CO2 22 - 32 mmol/L 18(L) 27 28  Calcium 8.9 - 10.3 mg/dL 7.9(L) 8.6(L) 9.1  Total Protein 6.5 - 8.1 g/dL - 7.2 -  Total Bilirubin 0.3 - 1.2 mg/dL - 0.8 -  Alkaline Phos 38 - 126 U/L - 69 -  AST 15 - 41 U/L - 20 -  ALT 0 - 44 U/L - 26 -    Imaging studies: No new pertinent imaging studies   Assessment/Plan:  56 y.o. male 1 Day Post-Op s/p robotic assisted laparoscopic ultra low anterior resection, mobilization of the splenic flexures, low EEA, and creation of diverting loop ileostomy for adenocarcinoma of the distal sigmoid colon extending to the mid rectum   - Okay to continue diet as tolerated  - Continue IVF resuscitation; slight AKI post-operatively  - Okay to discontinue foley catheter   - Complete perioperative Abx  - Continue entereg until bowel function returns  - Continue surgical drain; monitor and record output daily   - Monitor ileostomy function; engage [place] RN   - Pain control prn; antiemetics prn  - Follow up labs (CBC, BMP) this morning - Mobilization as tolerated; will engage PT to ensure no home needs  - Home health orders initiated this morning  All of the above findings and recommendations were discussed with the patient, patient's family (wife at bedside), and  the medical team, and all of patient's and family's questions were answered to their expressed satisfaction.  -- Edison Simon, PA-C [facility] 03/24/2021, 7:51 AM [phone] M-F: 7am - 4pm

## 2021-03-24 NOTE — TOC Initial Note (Signed)
Transition of Care [facility]) - Initial/Assessment Note    Patient Details  Name: Tracy Gardner MRN: [id number] Date of Birth: 19-Aug-1965  Transition of Care [facility]) CM/SW Contact:    Beverly Sessions, RN Phone Number: 03/24/2021, 1:59 PM  Clinical Narrative:                  Admitted for: New ostomy Admitted from: Home with wife PCP: Venetia Maxon   Wife at bedside. Discussed option of home health RN and PT. Patient and wife declined at this time. PT eval pending.  Wife is a Designer, fashion/clothing here at [employer] and states she will be able to manage ostomy in the home.  Patient has been seen by [facility] RN  Expected Discharge Plan: Home/Self Care Barriers to Discharge: Continued Medical Work up   Patient Goals and CMS Choice        Expected Discharge Plan and Services Expected Discharge Plan: Home/Self Care   Discharge Planning Services: CM Consult                                          Prior Living Arrangements/Services   Lives with:: Spouse Patient language and need for interpreter reviewed:: Yes Do you feel safe going back to the place where you live?: Yes      Need for Family Participation in Patient Care: Yes (Comment) Care giver support system in place?: Yes (comment)   Criminal Activity/Legal Involvement Pertinent to Current Situation/Hospitalization: No - Comment as needed  Activities of Daily Living Home Assistive Devices/Equipment: None ADL Screening (condition at time of admission) Patient's cognitive ability adequate to safely complete daily activities?: Yes Is the patient deaf or have difficulty hearing?: No Does the patient have difficulty seeing, even when wearing glasses/contacts?: No Does the patient have difficulty concentrating, remembering, or making decisions?: No Patient able to express need for assistance with ADLs?: Yes Does the patient have difficulty dressing or bathing?: No Independently performs ADLs?: Yes (appropriate for developmental age) Does the  patient have difficulty walking or climbing stairs?: No Weakness of Legs: None Weakness of Arms/Hands: None  Permission Sought/Granted                  Emotional Assessment       Orientation: : Oriented to Self, Oriented to Place, Oriented to  Time, Oriented to Situation Alcohol / Substance Use: Not Applicable Psych Involvement: No (comment)  Admission diagnosis:  S/P laparoscopic colectomy [Z90.49] Patient Active Problem List   Diagnosis Date Noted   S/P laparoscopic colectomy 03/23/2021   Iron deficiency anemia due to chronic blood loss 03/10/2021   Adenocarcinoma of colon ([place])    Melena    Gastric erythema    Hematochezia 03/08/2021   OSA on CPAP 10/24/2020   Loud snoring 07/27/2020   Acute HFrEF (heart failure with reduced ejection fraction) ([place])    Cardiomyopathy ([place])    Paroxysmal atrial fibrillation ([place]) 07/07/2020   Dilated cardiomyopathy ([place]) 07/07/2020   Bronchial pneumonia 07/07/2020   Smoker 07/07/2020   Diabetes mellitus type 2 with complications ([place]) 95/11/3265   Chronic kidney disease, stage 3a ([place]) 01/15/2019   Erythrocytosis 12/03/2017   Acquired absence of kidney 05/15/2017   Asthma 03/14/2017   Essential hypertension 03/14/2017   PCP:  [name], PA-C Pharmacy:   [place] [address] Phone: [phone] Fax:  [phone]     Social Determinants of Health (SDOH) Interventions    Readmission Risk Interventions No flowsheet data found.

## 2021-03-24 NOTE — Plan of Care (Signed)
°  Problem: Clinical Measurements: Goal: Ability to maintain clinical measurements within normal limits will improve Outcome: Progressing Goal: Will remain free from infection Outcome: Progressing Goal: Diagnostic test results will improve Outcome: Progressing Goal: Respiratory complications will improve Outcome: Progressing Goal: Cardiovascular complication will be avoided Outcome: Progressing   Problem: Pain Managment: Goal: General experience of comfort will improve Outcome: Progressing   Pt is involved in and agrees with the plan of care. A&Ox4. Bp-97/55, HR-61. Reports pain on his surgical incision; scheduled tylenol and toradol IV given. JP in place; drained 141ml bloody output. Ileostomy in place; no output. FC in place; has only 300 ml urine output. Pt still on oxygen at 3lpm/[place]; oxygen sats dropped on RA.

## 2021-03-25 ENCOUNTER — Inpatient Hospital Stay: Payer: 59

## 2021-03-25 LAB — GLUCOSE, CAPILLARY
Glucose-Capillary: 142 mg/dL — ABNORMAL HIGH (ref 70–99)
Glucose-Capillary: 132 mg/dL — ABNORMAL HIGH (ref 70–99)
Glucose-Capillary: 139 mg/dL — ABNORMAL HIGH (ref 70–99)
Glucose-Capillary: 131 mg/dL — ABNORMAL HIGH (ref 70–99)

## 2021-03-25 LAB — COMPREHENSIVE METABOLIC PANEL
ALT: 25 U/L (ref 0–44)
AST: 36 U/L (ref 15–41)
Albumin: 3.3 g/dL — ABNORMAL LOW (ref 3.5–5.0)
Alkaline Phosphatase: 50 U/L (ref 38–126)
Anion gap: 8 (ref 5–15)
BUN: 19 mg/dL (ref 6–20)
CO2: 23 mmol/L (ref 22–32)
Calcium: 8.2 mg/dL — ABNORMAL LOW (ref 8.9–10.3)
Chloride: 107 mmol/L (ref 98–111)
Creatinine, Ser: 1.5 mg/dL — ABNORMAL HIGH (ref 0.61–1.24)
GFR, Estimated: 55 mL/min — ABNORMAL LOW (ref >60)
Glucose, Bld: 115 mg/dL — ABNORMAL HIGH (ref 70–99)
Potassium: 4.6 mmol/L (ref 3.5–5.1)
Sodium: 138 mmol/L (ref 135–145)
Total Bilirubin: 0.6 mg/dL (ref 0.3–1.2)
Total Protein: 6.7 g/dL (ref 6.5–8.1)

## 2021-03-25 LAB — CBC
HCT: 31.5 % — ABNORMAL LOW (ref 39.0–52.0)
Hemoglobin: 9.4 g/dL — ABNORMAL LOW (ref 13.0–17.0)
MCH: 23.3 pg — ABNORMAL LOW (ref 26.0–34.0)
MCHC: 29.8 g/dL — ABNORMAL LOW (ref 30.0–36.0)
MCV: 78.2 fL — ABNORMAL LOW (ref 80.0–100.0)
Platelets: 174 10*3/uL (ref 150–400)
RBC: 4.03 MIL/uL — ABNORMAL LOW (ref 4.22–5.81)
RDW: 21.2 % — ABNORMAL HIGH (ref 11.5–15.5)
WBC: 11.4 10*3/uL — ABNORMAL HIGH (ref 4.0–10.5)
nRBC: 0 % (ref 0.0–0.2)

## 2021-03-25 NOTE — Plan of Care (Addendum)
°  Problem: Clinical Measurements: Goal: Ability to maintain clinical measurements within normal limits will improve Outcome: Progressing Goal: Will remain free from infection Outcome: Progressing Goal: Diagnostic test results will improve Outcome: Progressing Goal: Respiratory complications will improve Outcome: Progressing Goal: Cardiovascular complication will be avoided Outcome: Progressing   Problem: Pain Managment: Goal: General experience of comfort will improve Outcome: Progressing   Pt is involved in and agrees with the plan of care. V/S stable. Pt abdomen is distended and reports bloatedness which started after dinner. Encouraged to ambulate but pt not ready yet, only tried to sit on the edge of the bed. No nausea and vomiting noted. Oncall provider informed. Simethicone given as ordered. Diet switched to clear liq.  Abd xray ordered by Dr Hampton Abbot. Voiding well.  Bowel sounds active. Passing gas from the ostomy bag and has little dark greenish output. Oxycodone given for pain.

## 2021-03-25 NOTE — Progress Notes (Signed)
03/25/2021  Subjective: Patient is 2 Days Post-Op s/p robotic assisted ultra low anterior resection with diverting loop ileostomy.  Patient started feeling more bloated and distended overnight.  Diet was changed to clear liquids and KUB was obtained, showing post-op ileus.  Denies any nausea or vomiting, but reports distention/tightness and some burping.  Vital signs: Temp:  [97.6 F (36.4 C)-99 F (37.2 C)] 97.6 F (36.4 C) (01/14 0740) Pulse Rate:  [61-67] 67 (01/14 0740) Resp:  [18-20] 18 (01/14 0740) BP: (90-120)/(47-74) 120/71 (01/14 0740) SpO2:  [91 %-96 %] 96 % (01/14 0740)   Intake/Output: 01/13 0701 - 01/14 0700 In: 240 [P.O.:240] Out: 1997 [Urine:1600; Drains:397]    Physical Exam: Constitutional: No acute distress Abdomen:  soft, distended, with appropriate tenderness to palpation over the incisions.  Incisions clean, dry, intact.  RLQ ostomy with pink/viable mucosa, only small amount of liquid and gas in bag.  Drain with serosanguinous fluid.  Labs:  Recent Labs    03/24/21 0843 03/25/21 0551  WBC 11.2* 11.4*  HGB 9.3* 9.4*  HCT 31.7* 31.5*  PLT 184 174   Recent Labs    03/24/21 0843 03/25/21 0551  NA 135 138  K 4.5 4.6  CL 107 107  CO2 19* 23  GLUCOSE 163* 115*  BUN 22* 19  CREATININE 1.74* 1.50*  CALCIUM 7.7* 8.2*   No results for input(s): LABPROT, INR in the last 72 hours.  Imaging: DG Abd 2 Views  Result Date: 03/25/2021 CLINICAL DATA:  Abdominal pain and distention EXAM: ABDOMEN - 2 VIEW COMPARISON:  CT done on 03/09/2021 FINDINGS: There is dilation of small-bowel loops measuring up to 6.1 cm in diameter. Stomach is not distended. There is moderate distention of colon. There is linear tubular structure in the right side of pelvis. There are pockets of air in the subcutaneous plane in the abdominal wall, possibly related to recent surgery. IMPRESSION: There is abnormal dilation of small-bowel loops. There is moderate gaseous distention of proximal  colon. Findings suggest ileus or distal colonic obstruction. There is a tubular structure in the right side of pelvis which may suggest a surgical drain or foreign body. Please correlate with clinical history. Electronically Signed   By: Elmer Picker M.D.   On: 03/25/2021 08:38    Assessment/Plan: This is a 56 y.o. male s/p robotic assisted ultra low anterior resection with diverting loop ileostomy.  --Discussed with the patient the findings on the KUB this morning and the reason that post-op ileus sometimes develops.  For now we have changed his diet to clear liquids, but discussed with the patient that if he has any nausea or vomiting, he would need NG tube for decompression.  Otherwise we'll await better return of bowel function.   --Continue IV fluids for hydration --Encouraged to ambulate and be out of bed.   Melvyn Neth, [facility]

## 2021-03-26 LAB — GLUCOSE, CAPILLARY
Glucose-Capillary: 115 mg/dL — ABNORMAL HIGH (ref 70–99)
Glucose-Capillary: 109 mg/dL — ABNORMAL HIGH (ref 70–99)
Glucose-Capillary: 105 mg/dL — ABNORMAL HIGH (ref 70–99)
Glucose-Capillary: 116 mg/dL — ABNORMAL HIGH (ref 70–99)

## 2021-03-26 LAB — MAGNESIUM: Magnesium: 2 mg/dL (ref 1.7–2.4)

## 2021-03-26 LAB — BASIC METABOLIC PANEL
Anion gap: 4 — ABNORMAL LOW (ref 5–15)
BUN: 12 mg/dL (ref 6–20)
CO2: 24 mmol/L (ref 22–32)
Calcium: 8.4 mg/dL — ABNORMAL LOW (ref 8.9–10.3)
Chloride: 109 mmol/L (ref 98–111)
Creatinine, Ser: 1.15 mg/dL (ref 0.61–1.24)
GFR, Estimated: >60 mL/min (ref >60)
Glucose, Bld: 126 mg/dL — ABNORMAL HIGH (ref 70–99)
Potassium: 4.7 mmol/L (ref 3.5–5.1)
Sodium: 137 mmol/L (ref 135–145)

## 2021-03-26 MED ORDER — KETOROLAC TROMETHAMINE 15 MG/ML IJ SOLN
15.0000 mg | Freq: Four times a day (QID) | INTRAMUSCULAR | Status: DC
  Administered 2021-03-26 – 2021-03-27 (×4): 15 mg via INTRAVENOUS
  Filled 2021-03-26 (×4): qty 1

## 2021-03-26 NOTE — Progress Notes (Signed)
03/26/2021  Subjective: Patient is 3 Days Post-Op robotic assisted ultralow anterior resection with diverting loop ileostomy.  No acute events overnight.  Patient reports that his abdominal distention is feeling better and he feels less tight.  He has had ostomy output of about 1200 mL of liquid stool.  He tolerated clear liquids well yesterday.  This morning, creatinine is back to normal.  Vital signs: Temp:  [97.7 F (36.5 C)-98.7 F (37.1 C)] 98.7 F (37.1 C) (01/15 0826) Pulse Rate:  [61-67] 67 (01/15 0826) Resp:  [18-20] 18 (01/15 0826) BP: (117-134)/(66-84) 134/77 (01/15 0826) SpO2:  [95 %-98 %] 98 % (01/15 0826)   Intake/Output: 01/14 0701 - 01/15 0700 In: 2340 [P.O.:2340] Out: 2945 [Urine:1075; Drains:595; Stool:1275] Last BM Date: 03/26/21  Physical Exam: Constitutional: No acute distress Abdomen: Soft, obese, still distended but improved compared to yesterday with less tightness.  Incisions are healing well and are clean, dry, intact.  Right lower quadrant ostomy with pink mucosa and liquid stool and gas in the bag.  JP drain with serosanguineous fluid.  Labs:  Recent Labs    03/24/21 0843 03/25/21 0551  WBC 11.2* 11.4*  HGB 9.3* 9.4*  HCT 31.7* 31.5*  PLT 184 174   Recent Labs    03/25/21 0551 03/26/21 0747  NA 138 137  K 4.6 4.7  CL 107 109  CO2 23 24  GLUCOSE 115* 126*  BUN 19 12  CREATININE 1.50* 1.15  CALCIUM 8.2* 8.4*   No results for input(s): LABPROT, INR in the last 72 hours.  Imaging: No results found.  Assessment/Plan: This is a 56 y.o. male s/p robotic assisted ultralow anterior resection with diverting loop ileostomy.  - Patient has improved this morning and is less distended and is having better bowel function.  Pain is better controlled.  His creatinine has normalized. - We will start him now on a full liquid diet and cut down his IV fluids.  Most likely will keep him on this diet today and take it slowly to make sure that his bowel  function is fully returned and his distention keeps improving.  Likely will advance diet tomorrow.   Melvyn Neth, [facility]

## 2021-03-26 NOTE — Consult Note (Signed)
[place] Nurse ostomy follow up:  Telephone call to patient's spouse, Marcie Bal ,to inquire if she would like to be present for osotmy pouch change tomorrow (Monday). She reports that she and her husband have been practicing emptying the pouch and that she feels confident that she can manage without another session.  I offer her the assistance of this writer at any time by providing my mobile number and she expresses her gratitude.  I assure her that I will change the pouch tomorrow and at that time will ensure that there are sufficient pouches, skin barriers and skin barrier rings prepared for discharge to get started. Again, she expresses her appreciation.  I will see patient in the morning for a pouch change prior to his anticipated discharge early this week.  [place] nursing team will follow, and will remain available to this patient, the nursing and medical teams.   Thanks, Maudie Flakes, MSN, RN, [place], Arther Abbott  Pager# [phone]

## 2021-03-26 NOTE — Progress Notes (Signed)
Physical Therapy Treatment Patient Details Name: Tracy Gardner MRN: [id number] DOB: 06-Nov-1965 Today's Date: 03/26/2021   History of Present Illness admitted for acute hospitalization s/p robotic assisted laparoscopic ultra low anterior resection, mobilization of the splenic flexures, low EEA, and creation of diverting loop ileostomy for adenocarcinoma of the distal sigmoid colon extending to the mid rectum (03/23/21)    PT Comments    Pt ready for session.  Has been up walking with wife the past 2 days with RW.  Agrees to 1 lap.  OOB with time but no assist and is able to complete lap with supervision.  Leaves walker to side upon reaching bed and takes several steady steps without it to bed.  Encouraged to use incentive spirometer and continue gait with wife as tolerated.   Recommendations for follow up therapy are one component of a multi-disciplinary discharge planning process, led by the attending physician.  Recommendations may be updated based on patient status, additional functional criteria and insurance authorization.  Follow Up Recommendations  Home health PT     Assistance Recommended at Discharge PRN  Patient can return home with the following A little help with walking and/or transfers;A little help with bathing/dressing/bathroom   Equipment Recommendations  Rolling walker (2 wheels)    Recommendations for Other Services       Precautions / Restrictions Precautions Precautions: Fall Precaution Comments: R LQ JP drain, R LQ ostomy Restrictions Weight Bearing Restrictions: Yes     Mobility  Bed Mobility Overal bed mobility: Modified Independent Bed Mobility: Supine to Sit;Sit to Supine     Supine to sit: Modified independent (Device/Increase time) Sit to supine: Modified independent (Device/Increase time)        Transfers Overall transfer level: Needs assistance Equipment used: Rolling walker (2 wheels) Transfers: Sit to/from Stand Sit to Stand: Modified  independent (Device/Increase time)                Ambulation/Gait Ambulation/Gait assistance: Modified independent (Device/Increase time);Supervision Gait Distance (Feet): 200 Feet Assistive device: Rolling walker (2 wheels) Gait Pattern/deviations: Step-through pattern Gait velocity: dec     General Gait Details: slow but completes with supervision   Stairs             Wheelchair Mobility    Modified Rankin (Stroke Patients Only)       Balance Overall balance assessment: Modified Independent                                          Cognition Arousal/Alertness: Awake/alert Behavior During Therapy: WFL for tasks assessed/performed Overall Cognitive Status: Within Functional Limits for tasks assessed                                          Exercises      General Comments        Pertinent Vitals/Pain Pain Assessment: No/denies pain Pain Intervention(s): Monitored during session    Home Living                          Prior Function            PT Goals (current goals can now be found in the care plan section) Progress towards PT goals: Progressing toward goals  Frequency    Min 2X/week      PT Plan Current plan remains appropriate    Co-evaluation              AM-PAC PT "6 Clicks" Mobility   Outcome Measure  Help needed turning from your back to your side while in a flat bed without using bedrails?: None Help needed moving from lying on your back to sitting on the side of a flat bed without using bedrails?: None Help needed moving to and from a bed to a chair (including a wheelchair)?: A Little Help needed standing up from a chair using your arms (e.g., wheelchair or bedside chair)?: A Little Help needed to walk in hospital room?: A Little Help needed climbing 3-5 steps with a railing? : A Little 6 Click Score: 20    End of Session   Activity Tolerance: Patient tolerated  treatment well Patient left: in bed;with bed alarm set;with family/visitor present Nurse Communication: Mobility status PT Visit Diagnosis: Muscle weakness (generalized) (M62.81);Difficulty in walking, not elsewhere classified (R26.2);Pain     Time: 1655-3748 PT Time Calculation (min) (ACUTE ONLY): 9 min  Charges:  $Gait Training: 8-22 mins                    Chesley Noon, PTA 03/26/21, 1:30 PM

## 2021-03-27 ENCOUNTER — Other Ambulatory Visit: Payer: Self-pay

## 2021-03-27 LAB — GLUCOSE, CAPILLARY: Glucose-Capillary: 105 mg/dL — ABNORMAL HIGH (ref 70–99)

## 2021-03-27 MED ORDER — IBUPROFEN 600 MG PO TABS
600.0000 mg | ORAL_TABLET | Freq: Four times a day (QID) | ORAL | 0 refills | Status: DC | PRN
  Filled 2021-03-27: qty 30, 8d supply, fill #0

## 2021-03-27 MED ORDER — OXYCODONE HCL 5 MG PO TABS
5.0000 mg | ORAL_TABLET | ORAL | 0 refills | Status: DC | PRN
  Filled 2021-03-27: qty 30, 3d supply, fill #0

## 2021-03-27 MED ORDER — LOPERAMIDE HCL 2 MG PO CAPS: 2.0000 mg | ORAL_CAPSULE | Freq: Two times a day (BID) | ORAL | 0 refills | Status: DC

## 2021-03-27 MED ORDER — LOPERAMIDE HCL 2 MG PO CAPS
2.0000 mg | ORAL_CAPSULE | Freq: Two times a day (BID) | ORAL | Status: DC
  Administered 2021-03-27: 2 mg via ORAL
  Filled 2021-03-27: qty 1

## 2021-03-27 NOTE — TOC Transition Note (Signed)
Transition of Care [facility]) - CM/SW Discharge Note   Patient Details  Name: KOLBEY TEICHERT MRN: [id number] Date of Birth: March 13, 1965  [facility] CM/SW Contact:  Eileen Stanford, LCSW Phone Number: 03/27/2021, 9:43 AM   Clinical Narrative:  Pt refused HH. No additional needs.     Final next level of care: Home/Self Care Barriers to Discharge: No Barriers Identified   Patient Goals and CMS Choice        Discharge Placement                    Patient and family notified of of transfer: 03/27/21  Discharge Plan and Services   Discharge Planning Services: CM Consult                                 Social Determinants of Health (SDOH) Interventions     Readmission Risk Interventions No flowsheet data found.

## 2021-03-27 NOTE — Discharge Instructions (Addendum)
In addition to included general post-operative instructions,  Diet: Resume home diet.   Activity: No heavy lifting >20 pounds (children, pets, laundry, garbage) or strenuous activity for 6 weeks, but light activity and walking are encouraged. Do not drive or drink alcohol if taking narcotic pain medications or having pain that might distract from driving.  Wound care: You may shower/get incision wet with soapy water and pat dry (do not rub incisions), but no baths or submerging incision underwater until follow-up.   Ileostomy: Monitor ileostomy output. Expect this to be looser/watery from time to time. I have started you on 2mg  Imodium BID to help slow down output. This medication is available over the counter and can be titrated up and down as needed. An optimal goal should be about 101.5L a day from the ostomy. Remember to maintain hydration (ex: Gatorade, Pedialyte, etc)  Medications: Continue to hold BP medications; monitor BP at home. Recommend checking with cardiologist regarding resumption of blood pressure medications and Eliquis. There is no contraindication to these from surgical perspective.   Otherwise, resume all home medications. For mild to moderate pain: acetaminophen (Tylenol) or ibuprofen/naproxen (if no kidney disease). Combining Tylenol with alcohol can substantially increase your risk of causing liver disease. Narcotic pain medications, if prescribed, can be used for severe pain, though may cause nausea, constipation, and drowsiness. Do not combine Tylenol and Percocet (or similar) within a 6 hour period as Percocet (and similar) contain(s) Tylenol. If you do not need the narcotic pain medication, you do not need to fill the prescription.  Call office [phone] / [phone]) at any time if any questions, worsening pain, fevers/chills, bleeding, drainage from incision site, or other concerns.

## 2021-03-27 NOTE — Discharge Summary (Addendum)
[facility] SURGICAL DISCHARGE SUMMARY  Patient ID: Tracy Gardner MRN: [id number] DOB/AGE: 1966-02-28 56 y.o.  Admit date: 03/23/2021 Discharge date: 03/27/2021  Discharge Diagnoses Patient Active Problem List   Diagnosis Date Noted   S/P laparoscopic colectomy 03/23/2021   Adenocarcinoma of colon [facility])     Consultants None  Procedures 03/23/2021:  1. Robotic assisted Laparoscopic Ultra Low anterior resection 2. Robotic Laparoscopic takedown of splenic flexure 3. Diverting loop ileostomy   HPI: Tracy Gardner is a 56 y.o. male with history of sigmoid/rectal carcinoma who presents for resection on 01/12 with Dr Dahlia Byes.   Hospital Course: Informed consent was obtained and documented, and patient underwent robotic assisted laparoscopic LAR with ultra low EEA and creation of diverting loop ileostomy (Dr Dahlia Byes, 03/23/2021).  Post-operatively, patient did have a post-operative ileus but this resolved on POD3. Advancement of patient's diet and ambulation were well-tolerated. The remainder of patient's hospital course was essentially unremarkable, and discharge planning was initiated accordingly with patient safely able to be discharged home with appropriate discharge instructions, pain control, and outpatient follow-up after all of his (and wife's) questions were answered to their expressed satisfaction.   Discharge Condition: Good   Physical Examination:  Constitutional: alert, cooperative and no distress  Respiratory: breathing non-labored at rest Cardiovascular: regular rate and sinus rhythm  Gastrointestinal: Soft, non-tender, non-distended, no rebound/guarding. Surgical drain in RLQ with serosanguinous output (will remove). Ileostomy in RUQ; gas and liquid stool in bag Integumentary: Laparoscopic incisions are CDI; no erythema    Allergies as of 03/27/2021   No Known Allergies      Medication List     STOP taking these medications    bisacodyl 5 MG EC  tablet Commonly known as: DULCOLAX   doxycycline 100 MG capsule Commonly known as: VIBRAMYCIN   metroNIDAZOLE 250 MG tablet Commonly known as: FLAGYL   metroNIDAZOLE 500 MG tablet Commonly known as: FLAGYL   neomycin 500 MG tablet Commonly known as: MYCIFRADIN   polyethylene glycol powder 17 GM/SCOOP powder Commonly known as: MiraLax       TAKE these medications    Continue to hold BP medications; monitor BP at home. Recommend checking with cardiologist regarding resumption of blood pressure medications and Eliquis. There is no contraindication to these from surgical perspective  albuterol 108 (90 Base) MCG/ACT inhaler Commonly known as: VENTOLIN HFA Inhale 2 puffs into the lungs every 6 (six) hours as needed for wheezing or shortness of breath.   amiodarone 200 MG tablet Commonly known as: PACERONE Take 1 tablet (200 mg total) by mouth daily. Hold for HR less then 50   bismuth subsalicylate 427 MG chewable tablet Commonly known as: PEPTO BISMOL Chew 2 tablets (524 mg total) by mouth 4 (four) times daily -  before meals and at bedtime for 14 days.   bisoprolol 5 MG tablet Commonly known as: ZEBETA Take 0.5 tablets (2.5 mg total) by mouth daily.   cetirizine 10 MG tablet Commonly known as: ZYRTEC Take 10 mg by mouth daily.   doxylamine (Sleep) 25 MG tablet Commonly known as: UNISOM Take 25 mg by mouth at bedtime.   Flovent HFA 110 MCG/ACT inhaler Generic drug: fluticasone Inhale into the lungs 2 (two) times daily. What changed:  how much to take when to take this reasons to take this   furosemide 20 MG tablet Commonly known as: LASIX Take 10 mg by mouth daily.   ibuprofen 600 MG tablet Commonly known as: ADVIL Take 1 tablet (600 mg  total) by mouth every 6 (six) hours as needed.   Jardiance 10 MG Tabs tablet Generic drug: empagliflozin Take 1 tablet (10 mg total) by mouth daily.   loperamide 2 MG capsule Commonly known as: IMODIUM Take 1 capsule (2  mg total) by mouth 2 (two) times daily.   multivitamin with minerals tablet Take 1 tablet by mouth daily.   omeprazole 20 MG capsule Commonly known as: PRILOSEC Take 1 capsule (20 mg total) by mouth 2 (two) times daily for 14 days.   oxyCODONE 5 MG immediate release tablet Commonly known as: Oxy IR/ROXICODONE Take 1-2 tablets (5-10 mg total) by mouth every 4 (four) hours as needed for moderate pain.   rosuvastatin 10 MG tablet Commonly known as: CRESTOR Take 1 tablet (10 mg total) by mouth daily.          Follow-up Information     Pabon, [place] F, MD. Schedule an appointment as soon as possible for a visit on 04/03/2021.   Specialty: General Surgery Why: Follow up laparoscopic sigmoid colectomy with diverting ileostomy Contact information: [address] [place] [address] [phone]                  Time spent on discharge management including discussion of hospital course, clinical condition, outpatient instructions, prescriptions, and follow up with the patient and members of the medical team: >30 minutes  -- Edison Simon , PA-C [facility]  03/27/2021, 9:11 AM [phone] M-F: 7am - 4pm

## 2021-03-27 NOTE — Progress Notes (Signed)
Patient is being discharged. All paperwork including post-op directions and medications reviewed with patient and patients wife.JP drain removed with Danae Chen RN assistance. Wife is patients ride home.

## 2021-03-27 NOTE — Consult Note (Signed)
[place] Nurse ostomy follow up Stoma type/location: RMQ ileostomy Stomal assessment/size: 1 and 1/8 inch x 1 and 3/8 inch oval, minimally budded, red, moist Peristomal assessment: intact, clear Treatment options for stomal/peristomal skin: skin barrier ring Output: tan liquid stool, thickening since weekend and start of imodium Ostomy pouching: 2pc. 2 and 1/4 in ch ostomy pouching system with skin barrier ring Education provided: Wife assisted with pouch change.  Patient and wife with concern about passing of mucus and stool from rectum. Discussion about physiology contributing to this.  Also discussed incentive spirometer use and need to continue despite discharge.Gradual resumption of foods encouraged as he has escalated and then had to step back from regular diet. Change frequency reviewed and patient prefers selecting two routine days for pouch changes as opposed to every 3 days. Enrolled patient in [place] Start Discharge program: Yes, previously   Supplies at bedside for discharge, these are augmented today. Contact information provided for patient and wife's use should questions arise during the early days at home.  [place] nursing team will follow, and will remain available to this patient, the nursing and medical teams.   Thanks, Maudie Flakes, MSN, RN, [place], Arther Abbott  Pager# [phone]

## 2021-03-29 ENCOUNTER — Encounter: Payer: Self-pay | Admitting: *Deleted

## 2021-03-29 ENCOUNTER — Other Ambulatory Visit: Payer: Self-pay | Admitting: *Deleted

## 2021-03-29 ENCOUNTER — Telehealth: Payer: Self-pay | Admitting: Internal Medicine

## 2021-03-29 DIAGNOSIS — D500 Iron deficiency anemia secondary to blood loss (chronic): Secondary | ICD-10-CM

## 2021-03-29 DIAGNOSIS — D751 Secondary polycythemia: Secondary | ICD-10-CM

## 2021-03-29 DIAGNOSIS — C189 Malignant neoplasm of colon, unspecified: Secondary | ICD-10-CM

## 2021-03-29 NOTE — Telephone Encounter (Signed)
Please have the patient follow-up with me next 2 weeks or so-diagnosis colon cancer-MD; labs CBC CMP; CEA.  thanks GB

## 2021-03-29 NOTE — Patient Outreach (Signed)
Tracy Gardner C. Pearlina Friedly, MSN, GNP-BC ?Gerontological Nurse Practitioner ?[facility] Care Management ?[phone] ? ?

## 2021-03-30 ENCOUNTER — Telehealth: Payer: Self-pay | Admitting: *Deleted

## 2021-03-30 ENCOUNTER — Telehealth: Payer: Self-pay

## 2021-03-30 ENCOUNTER — Encounter: Payer: Self-pay | Admitting: *Deleted

## 2021-03-30 NOTE — Telephone Encounter (Signed)
Roselyn Reef from [facility] called and stating that this patient had a new ileostomy (03/23/21-[facility] Colectomy Dr Dahlia Byes) and he needs a referral for home health- her number is [phone] ext [phone]

## 2021-03-30 NOTE — Patient Instructions (Signed)
Visit Information  Thank you for taking time to visit with me today. Please don't hesitate to contact me if I can be of assistance to you before our next scheduled telephone appointment.  Following are the goals we discussed today:  (Copy and paste patient goals from clinical care plan here)  Our next appointment is by telephone on Wednesday, April 25th in the afternoon.  Following is a copy of your care plan:  Care Plan : Time Management Plan of Care  Updates made by Deloria Lair, NP since 03/30/2021 12:00 AM     Problem: Adenocarcinoma of colon   Priority: High  Onset Date: 03/29/2021     Goal: Patient will follow post operative instructions and attend all post op appts over the next 30 days.   Start Date: 03/30/2021  Expected End Date: 05/29/2021  Priority: High  Note:   Current Barriers:  Knowledge Deficits related to plan of care for management of cancer.   RNCM Clinical Goal(s):  Patient will verbalize basic understanding of cancer disease process and self health management plan as evidenced by telephone conversations. take all medications exactly as prescribed and will call provider for medication related questions as evidenced by pt report.    attend all scheduled medical appointments: surgical and oncology as evidenced by pt report and chart review.        work with [place] Team to appropriately treat cancer as evidenced by pt report and chart review  through collaboration with RN Care manager, provider, and care team.   Interventions: Inter-disciplinary care team collaboration (see longitudinal plan of care) Evaluation of current treatment plan related to  self management and patient's adherence to plan as established by provider  Patient Goals/Self-Care Activities: Attend all scheduled provider appointments Call provider office for new concerns or questions  Participate in [place] Management calls       Patient verbalizes understanding of instructions  and care plan provided today and agrees to view in [place]. Active MyChart status confirmed with patient.    Telephone follow up appointment with care management team member scheduled for: 04/05/21  Kayleen Memos C. Myrtie Neither, MSN, [facility] Gerontological Nurse Practitioner [facility] Care Management [phone]

## 2021-03-30 NOTE — Addendum Note (Signed)
Addended by: Vanice Sarah on: 03/30/2021 09:09 AM   Modules accepted: Orders

## 2021-03-30 NOTE — Telephone Encounter (Signed)
Faxed written orders for ileostomy supplies to [facility] at this time- patient notified to listen out for call. Faxed [phone] order, demographic sheet and insurance card all faxed.

## 2021-03-30 NOTE — Telephone Encounter (Signed)
Verbal order fro referral provided to [facility] for new ileostomy.

## 2021-03-30 NOTE — Telephone Encounter (Signed)
Patient scheduled, spoke with him to confirm.   Thanks! Anderson [place]

## 2021-03-30 NOTE — Patient Outreach (Addendum)
[facility]) Care Management Geriatric Nurse Practitioner Note   03/30/2021 Name:  Tracy Gardner MRN:  [id number] DOB:  [birth year]  Summary: Tracy Gardner agreed to participate in [place] Management Program  Recommendations/Changes made from today's visit: Follow surgical recommendations for care and follow up plan.  Subjective: Tracy Gardner is an 56 y.o. year old male who is a primary patient of [name], PA-C. The care management team was consulted for assistance with care management and/or care coordination needs.    Geriatric Nurse Practitioner completed Telephone Visit today.   Current Outpatient Medications on File Prior to Visit  Medication Sig Dispense Refill   albuterol (VENTOLIN HFA) 108 (90 Base) MCG/ACT inhaler Inhale 2 puffs into the lungs every 6 (six) hours as needed for wheezing or shortness of breath.     cetirizine (ZYRTEC) 10 MG tablet Take 10 mg by mouth daily.     empagliflozin (JARDIANCE) 10 MG TABS tablet Take 1 tablet (10 mg total) by mouth daily. 30 tablet 11   fluticasone (FLOVENT HFA) 110 MCG/ACT inhaler Inhale into the lungs 2 (two) times daily. (Patient taking differently: Inhale 1 puff into the lungs 2 (two) times daily as needed (asthma).) 12 g 12   furosemide (LASIX) 20 MG tablet Take 10 mg by mouth daily.     ibuprofen (ADVIL) 600 MG tablet Take 1 tablet (600 mg total) by mouth every 6 (six) hours as needed. 30 tablet 0   loperamide (IMODIUM) 2 MG capsule Take 1 capsule (2 mg total) by mouth 2 (two) times daily. 30 capsule 0   Multiple Vitamins-Minerals (MULTIVITAMIN WITH MINERALS) tablet Take 1 tablet by mouth daily.     omeprazole (PRILOSEC) 20 MG capsule Take 1 capsule (20 mg total) by mouth 2 (two) times daily for 14 days. 28 capsule 0   rosuvastatin (CRESTOR) 10 MG tablet Take 1 tablet (10 mg total) by mouth daily. 30 tablet 11   amiodarone (PACERONE) 200 MG tablet Take 1 tablet (200 mg total) by mouth daily. Hold for HR less  then 50 (Patient not taking: Reported on 03/29/2021) 90 tablet 3   bisoprolol (ZEBETA) 5 MG tablet Take 0.5 tablets (2.5 mg total) by mouth daily. (Patient not taking: Reported on 03/29/2021) 45 tablet 3   doxylamine, Sleep, (UNISOM) 25 MG tablet Take 25 mg by mouth at bedtime. (Patient not taking: Reported on 03/29/2021)     oxyCODONE (OXY IR/ROXICODONE) 5 MG immediate release tablet Take 1-2 tablets (5-10 mg total) by mouth every 4 (four) hours as needed for moderate pain. (Patient not taking: Reported on 03/29/2021) 30 tablet 0   No current facility-administered medications on file prior to visit.    Fall Risk 03/26/2021 03/26/2021 03/27/2021 03/29/2021 03/29/2021  Falls in the past year? - - - - 0  Was there an injury with Fall? - - - - 0  Fall Risk Category Calculator - - - - 0  Fall Risk Category - - - - Low  Patient Fall Risk Level Moderate fall risk Moderate fall risk Moderate fall risk Moderate fall risk Moderate fall risk  Patient at Risk for Falls Due to - - - - Medication side effect  Fall risk Follow up - - - - Falls evaluation completed   PHQ2 - not administered on this call.  SDOH:  (Social Determinants of Health) assessments and interventions performed:  SDOH Interventions    Flowsheet Row Most Recent Value  SDOH Interventions   Food Insecurity Interventions Intervention  Not Indicated  Transportation Interventions Intervention Not Indicated      Care Plan  Review of patient past medical history, allergies, medications, health status, including review of consultants reports, laboratory and other test data, was performed as part of comprehensive evaluation for care management services.   Care Plan : [place] Management Plan of Care  Updates made by Deloria Lair, NP since 03/30/2021 12:00 AM     Problem: Adenocarcinoma of colon   Priority: High  Onset Date: 03/29/2021     Goal: Patient will follow post operative instructions and attend all post op appts over the next 30  days.   Start Date: 03/30/2021  Expected End Date: 05/29/2021  Priority: High  Note:   Current Barriers:  Knowledge Deficits related to plan of care for management of cancer.   RNCM Clinical Goal(s):  Patient will verbalize basic understanding of cancer disease process and self health management plan as evidenced by telephone conversations. take all medications exactly as prescribed and will call provider for medication related questions as evidenced by pt report.    attend all scheduled medical appointments: surgical and oncology as evidenced by pt report and chart review.        work with [place] Team to appropriately treat cancer as evidenced by pt report and chart review  through collaboration with RN Care manager, provider, and care team.   Interventions: Inter-disciplinary care team collaboration (see longitudinal plan of care) Evaluation of current treatment plan related to  self management and patient's adherence to plan as established by provider  Patient Goals/Self-Care Activities: Attend all scheduled provider appointments Call provider office for new concerns or questions  Participate in [place] Management calls       Plan: Telephone follow up appointment with care management team member scheduled for:  1 week.  Eulah Pont. Myrtie Neither, MSN, [facility] Gerontological Nurse Practitioner [facility] Care Management [phone]

## 2021-03-31 ENCOUNTER — Encounter: Payer: Self-pay | Admitting: Internal Medicine

## 2021-03-31 ENCOUNTER — Telehealth: Payer: Self-pay

## 2021-03-31 NOTE — Telephone Encounter (Signed)
Spoke with Abelina Bachelor nurse and faxed referral- spoke with patient and he only wants home health care when taking Chemotherapy. I let him not sure if these services will be provided. He was instructed to listen out for a phone call from [place] that I have faxed order to [facility].  Santiago Glad ostomy nurse has reached out to patient and patient let Santiago Glad know that he did not need their services.

## 2021-03-31 NOTE — Progress Notes (Signed)
I spoke to patient regarding the results of pathology. Patient is recommended adjuvant chemotherapy. Patient interested in the port placement. Will inform Dr. Dahlia Byes.

## 2021-04-03 ENCOUNTER — Telehealth: Payer: Self-pay | Admitting: *Deleted

## 2021-04-03 ENCOUNTER — Telehealth: Payer: Self-pay | Admitting: Surgery

## 2021-04-03 DIAGNOSIS — Z599 Problem related to housing and economic circumstances, unspecified: Secondary | ICD-10-CM

## 2021-04-03 DIAGNOSIS — C189 Malignant neoplasm of colon, unspecified: Secondary | ICD-10-CM

## 2021-04-03 LAB — SURGICAL PATHOLOGY

## 2021-04-03 NOTE — Telephone Encounter (Signed)
Received incoming forms-FMLA on 03/31/2021. RN unable to finish processing the form at this time until treatment plan is determined. Patient is newly dx with colon cancer. Underwent colectomy on 03/23/2021 and is scheduled to have a port placed by Dr. Dahlia Byes on 04/13/2021.  Pt's next apt with Dr. Rogue Bussing is on 04/12/2021 for treatment planning.  My chart Msg sent to wife/patient to clarify continuous vs intermittent leave and the start dates for the leave.

## 2021-04-03 NOTE — Telephone Encounter (Signed)
Patient has been advised of Pre-Admission date/time, COVID Testing date and Surgery date.  Surgery Date: 04/13/21 Preadmission Testing Date: 04/06/21 (phone 8a-1p) Covid Testing Date: Not needed.     Patient has been made aware to call [phone], between 1-3:00pm the day before surgery, to find out what time to arrive for surgery.

## 2021-04-03 NOTE — Telephone Encounter (Signed)
Error

## 2021-04-04 ENCOUNTER — Emergency Department: Payer: 59

## 2021-04-04 ENCOUNTER — Other Ambulatory Visit: Payer: Self-pay

## 2021-04-04 ENCOUNTER — Telehealth: Payer: Self-pay | Admitting: Surgery

## 2021-04-04 ENCOUNTER — Inpatient Hospital Stay: Payer: 59

## 2021-04-04 ENCOUNTER — Inpatient Hospital Stay
Admission: EM | Admit: 2021-04-04 | Discharge: 2021-04-07 | DRG: 389 | Disposition: A | Discharge status: Home / Self Care | Payer: 59 | Attending: Internal Medicine | Admitting: Internal Medicine

## 2021-04-04 DIAGNOSIS — E118 Type 2 diabetes mellitus with unspecified complications: Secondary | ICD-10-CM | POA: Diagnosis present

## 2021-04-04 DIAGNOSIS — I429 Cardiomyopathy, unspecified: Secondary | ICD-10-CM | POA: Diagnosis present

## 2021-04-04 DIAGNOSIS — E861 Hypovolemia: Secondary | ICD-10-CM | POA: Diagnosis present

## 2021-04-04 DIAGNOSIS — R109 Unspecified abdominal pain: Secondary | ICD-10-CM | POA: Diagnosis present

## 2021-04-04 DIAGNOSIS — E871 Hypo-osmolality and hyponatremia: Secondary | ICD-10-CM | POA: Diagnosis present

## 2021-04-04 DIAGNOSIS — I129 Hypertensive chronic kidney disease with stage 1 through stage 4 chronic kidney disease, or unspecified chronic kidney disease: Secondary | ICD-10-CM | POA: Diagnosis not present

## 2021-04-04 DIAGNOSIS — I959 Hypotension, unspecified: Secondary | ICD-10-CM | POA: Diagnosis not present

## 2021-04-04 DIAGNOSIS — Z6831 Body mass index (BMI) 31.0-31.9, adult: Secondary | ICD-10-CM

## 2021-04-04 DIAGNOSIS — K56609 Unspecified intestinal obstruction, unspecified as to partial versus complete obstruction: Principal | ICD-10-CM

## 2021-04-04 DIAGNOSIS — I700 Atherosclerosis of aorta: Secondary | ICD-10-CM | POA: Diagnosis not present

## 2021-04-04 DIAGNOSIS — R0689 Other abnormalities of breathing: Secondary | ICD-10-CM | POA: Diagnosis not present

## 2021-04-04 DIAGNOSIS — I420 Dilated cardiomyopathy: Secondary | ICD-10-CM | POA: Diagnosis present

## 2021-04-04 DIAGNOSIS — K9189 Other postprocedural complications and disorders of digestive system: Secondary | ICD-10-CM

## 2021-04-04 DIAGNOSIS — Z7901 Long term (current) use of anticoagulants: Secondary | ICD-10-CM | POA: Diagnosis not present

## 2021-04-04 DIAGNOSIS — N183 Chronic kidney disease, stage 3 unspecified: Secondary | ICD-10-CM | POA: Diagnosis present

## 2021-04-04 DIAGNOSIS — Z9049 Acquired absence of other specified parts of digestive tract: Secondary | ICD-10-CM | POA: Diagnosis not present

## 2021-04-04 DIAGNOSIS — I10 Essential (primary) hypertension: Secondary | ICD-10-CM | POA: Diagnosis not present

## 2021-04-04 DIAGNOSIS — Z87891 Personal history of nicotine dependence: Secondary | ICD-10-CM

## 2021-04-04 DIAGNOSIS — R069 Unspecified abnormalities of breathing: Secondary | ICD-10-CM | POA: Diagnosis not present

## 2021-04-04 DIAGNOSIS — I480 Paroxysmal atrial fibrillation: Secondary | ICD-10-CM | POA: Diagnosis present

## 2021-04-04 DIAGNOSIS — Z85048 Personal history of other malignant neoplasm of rectum, rectosigmoid junction, and anus: Secondary | ICD-10-CM | POA: Diagnosis not present

## 2021-04-04 DIAGNOSIS — Z932 Ileostomy status: Secondary | ICD-10-CM | POA: Diagnosis not present

## 2021-04-04 DIAGNOSIS — R0602 Shortness of breath: Secondary | ICD-10-CM | POA: Diagnosis not present

## 2021-04-04 DIAGNOSIS — E1143 Type 2 diabetes mellitus with diabetic autonomic (poly)neuropathy: Secondary | ICD-10-CM | POA: Diagnosis present

## 2021-04-04 DIAGNOSIS — Z20822 Contact with and (suspected) exposure to covid-19: Secondary | ICD-10-CM | POA: Diagnosis present

## 2021-04-04 DIAGNOSIS — N179 Acute kidney failure, unspecified: Secondary | ICD-10-CM | POA: Diagnosis present

## 2021-04-04 DIAGNOSIS — E872 Acidosis, unspecified: Secondary | ICD-10-CM | POA: Diagnosis not present

## 2021-04-04 DIAGNOSIS — G4733 Obstructive sleep apnea (adult) (pediatric): Secondary | ICD-10-CM | POA: Diagnosis present

## 2021-04-04 DIAGNOSIS — R1084 Generalized abdominal pain: Secondary | ICD-10-CM

## 2021-04-04 DIAGNOSIS — F17200 Nicotine dependence, unspecified, uncomplicated: Secondary | ICD-10-CM | POA: Diagnosis present

## 2021-04-04 DIAGNOSIS — K3184 Gastroparesis: Secondary | ICD-10-CM | POA: Diagnosis present

## 2021-04-04 DIAGNOSIS — M47816 Spondylosis without myelopathy or radiculopathy, lumbar region: Secondary | ICD-10-CM | POA: Diagnosis not present

## 2021-04-04 DIAGNOSIS — Z79899 Other long term (current) drug therapy: Secondary | ICD-10-CM

## 2021-04-04 DIAGNOSIS — K567 Ileus, unspecified: Secondary | ICD-10-CM

## 2021-04-04 DIAGNOSIS — K56699 Other intestinal obstruction unspecified as to partial versus complete obstruction: Secondary | ICD-10-CM | POA: Diagnosis not present

## 2021-04-04 DIAGNOSIS — Z905 Acquired absence of kidney: Secondary | ICD-10-CM

## 2021-04-04 DIAGNOSIS — J4530 Mild persistent asthma, uncomplicated: Secondary | ICD-10-CM | POA: Diagnosis present

## 2021-04-04 DIAGNOSIS — I4819 Other persistent atrial fibrillation: Secondary | ICD-10-CM | POA: Diagnosis present

## 2021-04-04 DIAGNOSIS — E669 Obesity, unspecified: Secondary | ICD-10-CM | POA: Diagnosis present

## 2021-04-04 DIAGNOSIS — K913 Postprocedural intestinal obstruction, unspecified as to partial versus complete: Principal | ICD-10-CM | POA: Diagnosis present

## 2021-04-04 DIAGNOSIS — E860 Dehydration: Secondary | ICD-10-CM | POA: Diagnosis present

## 2021-04-04 DIAGNOSIS — Z4682 Encounter for fitting and adjustment of non-vascular catheter: Secondary | ICD-10-CM | POA: Diagnosis not present

## 2021-04-04 DIAGNOSIS — M47814 Spondylosis without myelopathy or radiculopathy, thoracic region: Secondary | ICD-10-CM | POA: Diagnosis not present

## 2021-04-04 DIAGNOSIS — E785 Hyperlipidemia, unspecified: Secondary | ICD-10-CM | POA: Diagnosis present

## 2021-04-04 DIAGNOSIS — Z7951 Long term (current) use of inhaled steroids: Secondary | ICD-10-CM

## 2021-04-04 DIAGNOSIS — E1122 Type 2 diabetes mellitus with diabetic chronic kidney disease: Secondary | ICD-10-CM | POA: Diagnosis not present

## 2021-04-04 DIAGNOSIS — R0902 Hypoxemia: Secondary | ICD-10-CM | POA: Diagnosis not present

## 2021-04-04 DIAGNOSIS — R231 Pallor: Secondary | ICD-10-CM | POA: Diagnosis not present

## 2021-04-04 DIAGNOSIS — K6389 Other specified diseases of intestine: Secondary | ICD-10-CM | POA: Diagnosis not present

## 2021-04-04 DIAGNOSIS — Z7984 Long term (current) use of oral hypoglycemic drugs: Secondary | ICD-10-CM

## 2021-04-04 DIAGNOSIS — K560 Paralytic ileus: Secondary | ICD-10-CM | POA: Diagnosis not present

## 2021-04-04 LAB — HEPATIC FUNCTION PANEL
ALT: 26 U/L (ref 0–44)
AST: 21 U/L (ref 15–41)
Albumin: 4.4 g/dL (ref 3.5–5.0)
Alkaline Phosphatase: 75 U/L (ref 38–126)
Bilirubin, Direct: 0.2 mg/dL (ref 0.0–0.2)
Indirect Bilirubin: 0.5 mg/dL (ref 0.3–0.9)
Total Bilirubin: 0.7 mg/dL (ref 0.3–1.2)
Total Protein: 8.7 g/dL — ABNORMAL HIGH (ref 6.5–8.1)

## 2021-04-04 LAB — CBC WITH DIFFERENTIAL/PLATELET
Abs Immature Granulocytes: 0.1 10*3/uL — ABNORMAL HIGH (ref 0.00–0.07)
Basophils Absolute: 0.1 10*3/uL (ref 0.0–0.1)
Basophils Relative: 1 %
Eosinophils Absolute: 0 10*3/uL (ref 0.0–0.5)
Eosinophils Relative: 0 %
HCT: 44.8 % (ref 39.0–52.0)
Hemoglobin: 14.5 g/dL (ref 13.0–17.0)
Immature Granulocytes: 1 %
Lymphocytes Relative: 10 %
Lymphs Abs: 1 10*3/uL (ref 0.7–4.0)
MCH: 23.9 pg — ABNORMAL LOW (ref 26.0–34.0)
MCHC: 32.4 g/dL (ref 30.0–36.0)
MCV: 73.8 fL — ABNORMAL LOW (ref 80.0–100.0)
Monocytes Absolute: 1.3 10*3/uL — ABNORMAL HIGH (ref 0.1–1.0)
Monocytes Relative: 12 %
Neutro Abs: 7.9 10*3/uL — ABNORMAL HIGH (ref 1.7–7.7)
Neutrophils Relative %: 76 %
Platelets: 313 10*3/uL (ref 150–400)
RBC: 6.07 MIL/uL — ABNORMAL HIGH (ref 4.22–5.81)
RDW: 22.3 % — ABNORMAL HIGH (ref 11.5–15.5)
WBC: 10.4 10*3/uL (ref 4.0–10.5)
nRBC: 0 % (ref 0.0–0.2)

## 2021-04-04 LAB — RESP PANEL BY RT-PCR (FLU A&B, COVID) ARPGX2
Influenza A by PCR: NEGATIVE
Influenza B by PCR: NEGATIVE
SARS Coronavirus 2 by RT PCR: NEGATIVE

## 2021-04-04 LAB — BASIC METABOLIC PANEL
Anion gap: 19 — ABNORMAL HIGH (ref 5–15)
BUN: 83 mg/dL — ABNORMAL HIGH (ref 6–20)
CO2: 16 mmol/L — ABNORMAL LOW (ref 22–32)
Calcium: 9.3 mg/dL (ref 8.9–10.3)
Chloride: 90 mmol/L — ABNORMAL LOW (ref 98–111)
Creatinine, Ser: 5.68 mg/dL — ABNORMAL HIGH (ref 0.61–1.24)
GFR, Estimated: 11 mL/min — ABNORMAL LOW (ref >60)
Glucose, Bld: 131 mg/dL — ABNORMAL HIGH (ref 70–99)
Potassium: 4.7 mmol/L (ref 3.5–5.1)
Sodium: 125 mmol/L — ABNORMAL LOW (ref 135–145)

## 2021-04-04 LAB — LIPASE, BLOOD: Lipase: 30 U/L (ref 11–51)

## 2021-04-04 LAB — LACTIC ACID, PLASMA: Lactic Acid, Venous: 1.5 mmol/L (ref 0.5–1.9)

## 2021-04-04 LAB — TROPONIN I (HIGH SENSITIVITY): Troponin I (High Sensitivity): 13 ng/L (ref <18)

## 2021-04-04 MED ORDER — ONDANSETRON HCL 4 MG PO TABS
4.0000 mg | ORAL_TABLET | Freq: Three times a day (TID) | ORAL | 0 refills | Status: DC | PRN
  Filled 2021-04-04: qty 20, 7d supply, fill #0

## 2021-04-04 MED ORDER — OXYCODONE HCL 5 MG PO TABS: 5.0000 mg | ORAL_TABLET | ORAL | Status: DC | PRN

## 2021-04-04 MED ORDER — HEPARIN SODIUM (PORCINE) 5000 UNIT/ML IJ SOLN
5000.0000 [IU] | Freq: Three times a day (TID) | INTRAMUSCULAR | Status: DC
  Administered 2021-04-05 – 2021-04-07 (×7): 5000 [IU] via SUBCUTANEOUS
  Filled 2021-04-04 (×7): qty 1

## 2021-04-04 MED ORDER — INSULIN ASPART 100 UNIT/ML IJ SOLN
0.0000 [IU] | Freq: Three times a day (TID) | INTRAMUSCULAR | Status: DC
  Administered 2021-04-07: 1 [IU] via SUBCUTANEOUS
  Filled 2021-04-04: qty 1

## 2021-04-04 MED ORDER — AMIODARONE HCL 200 MG PO TABS
200.0000 mg | ORAL_TABLET | Freq: Every day | ORAL | Status: DC
  Administered 2021-04-05 – 2021-04-06 (×2): 200 mg
  Filled 2021-04-04 (×2): qty 1

## 2021-04-04 MED ORDER — ALBUTEROL SULFATE (2.5 MG/3ML) 0.083% IN NEBU
2.5000 mg | INHALATION_SOLUTION | Freq: Four times a day (QID) | RESPIRATORY_TRACT | Status: DC | PRN
  Administered 2021-04-07: 2.5 mg via RESPIRATORY_TRACT
  Filled 2021-04-04: qty 3

## 2021-04-04 MED ORDER — ALBUTEROL SULFATE HFA 108 (90 BASE) MCG/ACT IN AERS: 2.0000 | INHALATION_SPRAY | Freq: Four times a day (QID) | RESPIRATORY_TRACT | Status: DC | PRN

## 2021-04-04 MED ORDER — FLUTICASONE PROPIONATE HFA 110 MCG/ACT IN AERO
2.0000 | INHALATION_SPRAY | Freq: Two times a day (BID) | RESPIRATORY_TRACT | Status: DC
Start: 2021-04-04 — End: 2021-04-05

## 2021-04-04 MED ORDER — SODIUM CHLORIDE 0.9 % IV SOLN: INTRAVENOUS | Status: DC

## 2021-04-04 MED ORDER — SODIUM CHLORIDE 0.9 % IV BOLUS
1000.0000 mL | Freq: Once | INTRAVENOUS | Status: AC
  Administered 2021-04-04: 23:00:00 1000 mL via INTRAVENOUS

## 2021-04-04 MED ORDER — ROSUVASTATIN CALCIUM 10 MG PO TABS
10.0000 mg | ORAL_TABLET | Freq: Every day | ORAL | Status: DC
  Administered 2021-04-05 – 2021-04-07 (×3): 10 mg
  Filled 2021-04-04 (×3): qty 1

## 2021-04-04 MED ORDER — SODIUM CHLORIDE 0.9 % IV BOLUS
1000.0000 mL | Freq: Once | INTRAVENOUS | Status: AC
  Administered 2021-04-04: 20:00:00 1000 mL via INTRAVENOUS

## 2021-04-04 NOTE — Telephone Encounter (Signed)
Spoke with the patient and he has been having severe stomach distention with bloating and gas. It is very painful. He is unable to eat hardly anything due to this and is only taking sips of water. He reports some nausea, no vomiting. He has tried taking Simethicone and Pepto bismol. He is concerned about his weight loss since surgery and weakness compounded by inability to eat much.  Call to Dr Dahlia Byes.  Spoke with Dr Dahlia Byes and he will see the patient tomorrow in clinic. He says for now only clear liquids. If it continues or worsens he may need to go to the ER. We will send him in a prescription for Zofran.

## 2021-04-04 NOTE — ED Notes (Signed)
Pt transported to CT via stretcher with CT tech. °

## 2021-04-04 NOTE — ED Notes (Signed)
Covid swab & gray tube on ice sent to lab.

## 2021-04-04 NOTE — Telephone Encounter (Signed)
Patients wife is calling and said her husband isnt able to eat and is losing weight, patient is having a lot of gas pain, pain level at a 9 sometimes at a 10 said he is having a lot of pressure patient has tried taken everything and nothing is helping. Please call patient and advise.
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
## 2021-04-04 NOTE — ED Triage Notes (Signed)
Low oxygen per ems , 88%, from home , sob, colon cancer , recent ostomy,

## 2021-04-04 NOTE — ED Provider Notes (Signed)
[facility] Provider Note    Event Date/Time   First MD Initiated Contact with Patient 04/04/21 1840     (approximate)   History   Shortness of Breath (Low oxygen per ems , 88%, from home , sob, colon cancer )   HPI  Tracy Gardner is a 56 y.o. male with PMH of DM, paroxysmal A. fib on Eliquis, hypertension, CKD, and colorectal cancer status post resection and ileostomy on 1/12 who presents with increased abdominal distention over the last week and a sensation of pressure.  He does not have significant pain except to touch, but is very uncomfortable.  It is worse after eating but present constantly.  He reports minimal appetite.  He has had a large amount of output from the ileostomy.  He denies any fevers, but does report associated shortness of breath.    Physical Exam   Triage Vital Signs: ED Triage Vitals  Enc Vitals Group     BP 04/04/21 1841 98/65     Pulse Rate 04/04/21 1841 72     Resp 04/04/21 1841 17     Temp 04/04/21 1841 98.5 F (36.9 C)     Temp Source 04/04/21 1841 Oral     SpO2 04/04/21 1841 99 %     Weight 04/04/21 1842 222 lb 10.6 oz (101 kg)     Height 04/04/21 1842 5\' 11"  (1.803 m)     Head Circumference --      Peak Flow --      Pain Score 04/04/21 1842 4     Pain Loc --      Pain Edu? --      Excl. in [place]? --     Most recent vital signs: Vitals:   04/04/21 1930 04/04/21 2000  BP: 108/66 109/69  Pulse: 65 65  Resp:    Temp:    SpO2: 98% 94%     General: Alert, oriented x4, uncomfortable appearing. CV:  Good peripheral perfusion.  Resp:  Normal effort.  Abd:  Moderate distention.  Right upper quadrant tenderness. Other:  No peripheral edema.  No scleral icterus.  Mucous membranes slightly dry.   ED Results / Procedures / Treatments   Labs (all labs ordered are listed, but only abnormal results are displayed) Labs Reviewed  BASIC METABOLIC PANEL - Abnormal; Notable for the following components:      Result Value    Sodium 125 (*)    Chloride 90 (*)    CO2 16 (*)    Glucose, Bld 131 (*)    BUN 83 (*)    Creatinine, Ser 5.68 (*)    GFR, Estimated 11 (*)    Anion gap 19 (*)    All other components within normal limits  HEPATIC FUNCTION PANEL - Abnormal; Notable for the following components:   Total Protein 8.7 (*)    All other components within normal limits  CBC WITH DIFFERENTIAL/PLATELET - Abnormal; Notable for the following components:   RBC 6.07 (*)    MCV 73.8 (*)    MCH 23.9 (*)    RDW 22.3 (*)    Neutro Abs 7.9 (*)    Monocytes Absolute 1.3 (*)    Abs Immature Granulocytes 0.10 (*)    All other components within normal limits  RESP PANEL BY RT-PCR (FLU A&B, COVID) [id number]  LACTIC ACID, PLASMA  LIPASE, BLOOD  LACTIC ACID, PLASMA  TROPONIN I (HIGH SENSITIVITY)  TROPONIN I (HIGH SENSITIVITY)     EKG  ED ECG REPORT I, Arta Silence, the attending physician, personally viewed and interpreted this ECG.  Date: 04/04/2021 EKG Time: 2239 Rate: 68 Rhythm: normal sinus rhythm QRS Axis: normal Intervals: Borderline prolonged QTc ST/T Wave abnormalities: normal Narrative Interpretation: no evidence of acute ischemia    RADIOLOGY  Chest x-ray interpreted by me shows no focal consolidation or edema, and no abdominal free air  CT abdomen/pelvis interpreted by me shows multiple small bowel loops concerning for obstruction.  I reviewed the radiology report which confirms concern for small bowel obstruction.  IMPRESSION:  Right abdominal ileostomy. Small bowel leading into the ileostomy is  dilated with air-fluid levels concerning for small bowel  obstruction. Small bowel bleeding from the ileostomy to the terminal  ileum is decompressed.     Aortic atherosclerosis.    PROCEDURES:  Critical Care performed: No  Procedures   MEDICATIONS ORDERED IN ED: Medications  fluticasone (FLOVENT HFA) 110 MCG/ACT inhaler 2 puff (has no administration in time range)  amiodarone  (PACERONE) tablet 200 mg (has no administration in time range)  rosuvastatin (CRESTOR) tablet 10 mg (has no administration in time range)  oxyCODONE (Oxy IR/ROXICODONE) immediate release tablet 5-10 mg (has no administration in time range)  insulin aspart (novoLOG) injection 0-6 Units (has no administration in time range)  heparin injection 5,000 Units (has no administration in time range)  0.9 %  sodium chloride infusion (has no administration in time range)  albuterol (PROVENTIL) (2.5 MG/3ML) 0.083% nebulizer solution 2.5 mg (has no administration in time range)  sodium chloride 0.9 % bolus 1,000 mL (1,000 mLs Intravenous New Bag/Given 04/04/21 1932)  sodium chloride 0.9 % bolus 1,000 mL (1,000 mLs Intravenous New Bag/Given 04/04/21 2256)     IMPRESSION / MDM / ASSESSMENT AND PLAN / ED COURSE  I reviewed the triage vital signs and the nursing notes.  56 year old male with PMH as noted above presents with increased abdominal distention and pressure, nausea, decreased appetite after colon resection and ileostomy on 1/12.  On exam the vital signs are normal except for borderline low BP.  The abdomen is distended with some tenderness in the right upper quadrant.  Differential diagnosis includes, but is not limited to, SBO, ileus, volvulus, perforation, other postsurgical complication, cholecystitis, pancreatitis, colitis, diverticulitis, gastroenteritis.  We will obtain lab work-up, CT abdomen/pelvis, give fluids and reassess.  The patient is on the cardiac monitor to evaluate for evidence of arrhythmia and/or significant heart rate changes.  ----------------------------------------- 9:42 PM on 04/04/2021 -----------------------------------------  Lab work-up is significant for AKI with elevated BUN and anion gap, likely due to dehydration and poor p.o. intake.  The sodium is also low.  CBC is unremarkable.  Respiratory panel is negative.  Lactic acid is normal.  CT abdomen is concerning  for small bowel obstruction.  I consulted Dr. Peyton Najjar from general surgery.  He recommends placing an NG tube and will see the patient.  ----------------------------------------- 11:07 PM on 04/04/2021 -----------------------------------------  I consulted Dr. Posey Pronto from the hospitalist service; based on our discussion she agrees to admit the patient.  FINAL CLINICAL IMPRESSION(S) / ED DIAGNOSES   Final diagnoses:  Small bowel obstruction ([place])  AKI (acute kidney injury) ([place])  Hyponatremia     Rx / DC Orders   ED Discharge Orders     None        Note:  This document was prepared using Dragon voice recognition software and may include unintentional dictation errors.    Arta Silence, MD 04/04/21 [phone]

## 2021-04-04 NOTE — ED Notes (Signed)
Pt transported back to room from CT via stretcher with CT tech.

## 2021-04-05 ENCOUNTER — Inpatient Hospital Stay: Payer: 59

## 2021-04-05 ENCOUNTER — Other Ambulatory Visit: Payer: Self-pay | Admitting: *Deleted

## 2021-04-05 ENCOUNTER — Other Ambulatory Visit: Payer: Self-pay

## 2021-04-05 ENCOUNTER — Encounter: Payer: 59 | Admitting: Surgery

## 2021-04-05 DIAGNOSIS — N179 Acute kidney failure, unspecified: Secondary | ICD-10-CM

## 2021-04-05 LAB — CBC
HCT: 37.7 % — ABNORMAL LOW (ref 39.0–52.0)
Hemoglobin: 12 g/dL — ABNORMAL LOW (ref 13.0–17.0)
MCH: 23.5 pg — ABNORMAL LOW (ref 26.0–34.0)
MCHC: 31.8 g/dL (ref 30.0–36.0)
MCV: 73.8 fL — ABNORMAL LOW (ref 80.0–100.0)
Platelets: 252 10*3/uL (ref 150–400)
RBC: 5.11 MIL/uL (ref 4.22–5.81)
RDW: 22.3 % — ABNORMAL HIGH (ref 11.5–15.5)
WBC: 7.4 10*3/uL (ref 4.0–10.5)
nRBC: 0 % (ref 0.0–0.2)

## 2021-04-05 LAB — COMPREHENSIVE METABOLIC PANEL
ALT: 21 U/L (ref 0–44)
AST: 13 U/L — ABNORMAL LOW (ref 15–41)
Albumin: 3.5 g/dL (ref 3.5–5.0)
Alkaline Phosphatase: 54 U/L (ref 38–126)
Anion gap: 14 (ref 5–15)
BUN: 79 mg/dL — ABNORMAL HIGH (ref 6–20)
CO2: 16 mmol/L — ABNORMAL LOW (ref 22–32)
Calcium: 8.2 mg/dL — ABNORMAL LOW (ref 8.9–10.3)
Chloride: 98 mmol/L (ref 98–111)
Creatinine, Ser: 4.81 mg/dL — ABNORMAL HIGH (ref 0.61–1.24)
GFR, Estimated: 13 mL/min — ABNORMAL LOW (ref >60)
Glucose, Bld: 103 mg/dL — ABNORMAL HIGH (ref 70–99)
Potassium: 4.2 mmol/L (ref 3.5–5.1)
Sodium: 128 mmol/L — ABNORMAL LOW (ref 135–145)
Total Bilirubin: 0.4 mg/dL (ref 0.3–1.2)
Total Protein: 7 g/dL (ref 6.5–8.1)

## 2021-04-05 LAB — GLUCOSE, CAPILLARY
Glucose-Capillary: 106 mg/dL — ABNORMAL HIGH (ref 70–99)
Glucose-Capillary: 108 mg/dL — ABNORMAL HIGH (ref 70–99)
Glucose-Capillary: 112 mg/dL — ABNORMAL HIGH (ref 70–99)
Glucose-Capillary: 147 mg/dL — ABNORMAL HIGH (ref 70–99)
Glucose-Capillary: 97 mg/dL (ref 70–99)

## 2021-04-05 LAB — MAGNESIUM: Magnesium: 1.9 mg/dL (ref 1.7–2.4)

## 2021-04-05 LAB — TROPONIN I (HIGH SENSITIVITY): Troponin I (High Sensitivity): 12 ng/L (ref <18)

## 2021-04-05 LAB — LACTIC ACID, PLASMA: Lactic Acid, Venous: 1.3 mmol/L (ref 0.5–1.9)

## 2021-04-05 MED ORDER — LACTATED RINGERS IV SOLN: INTRAVENOUS | Status: DC

## 2021-04-05 MED ORDER — BUDESONIDE 0.25 MG/2ML IN SUSP
0.2500 mg | Freq: Two times a day (BID) | RESPIRATORY_TRACT | Status: DC
  Administered 2021-04-05 – 2021-04-07 (×5): 0.25 mg via RESPIRATORY_TRACT
  Filled 2021-04-05 (×5): qty 2

## 2021-04-05 MED ORDER — ALBUMIN HUMAN 25 % IV SOLN
25.0000 g | Freq: Once | INTRAVENOUS | Status: AC
Start: 2021-04-05 — End: 2021-04-05
  Administered 2021-04-05: 13:00:00 25 g via INTRAVENOUS
  Filled 2021-04-05: qty 100

## 2021-04-05 NOTE — Progress Notes (Signed)
[place] Hospital Day(s): 1.   Interval History:  Patient seen and examined This morning, patient reports his abdominal pain and distension have essentially resolved since NGT placement. Output was recorded at 1.3L but wife states it has only been ~300 ccs since placement, and she has been with him the whole time. He continues to have ileostomy function and has emptied about 150 ccs this morning. No fever, chills, nausea, or emesis.  No repeat labs since admission KUB after NGT placement shows improvement in gastric and bowel dilation He is NPO but endorses feeling thirsty and hungry   Vital signs in last 24 hours: [min-max] current  Temp:  [97.6 F (36.4 C)-98.8 F (37.1 C)] 97.8 F (36.6 C) (01/25 0800) Pulse Rate:  [65-73] 73 (01/25 0800) Resp:  [16-18] 18 (01/25 0800) BP: (91-110)/(56-69) 105/56 (01/25 0800) SpO2:  [94 %-100 %] 97 % (01/25 0800) Weight:  [101 kg] 101 kg (01/24 1842)     Height: 5\' 11"  (180.3 cm) Weight: 101 kg BMI (Calculated): 31.07   Intake/Output last 2 shifts:  01/24 0701 - 01/25 0700 In: 907.1 [P.O.:80; I.V.:827.1] Out: 1300 [Stool:1300]   Physical Exam:  Constitutional: alert, cooperative and no distress  HEENT: NGT in place; no output Respiratory: breathing non-labored at rest  Cardiovascular: regular rate and sinus rhythm  Gastrointestinal: soft, non-tender, no distension this morning, no rebound/guarding, he is certainly without peritonitis. Ileostomy in the RLQ is intact, semisolid stool in bag, recently emptied.  Integumentary: Laparoscopic incisions are healing well.   Labs:  CBC Latest Ref Rng & Units 04/04/2021 03/25/2021 03/24/2021  WBC 4.0 - 10.5 K/uL 10.4 11.4(H) 11.2(H)  Hemoglobin 13.0 - 17.0 g/dL 14.5 9.4(L) 9.3(L)  Hematocrit 39.0 - 52.0 % 44.8 31.5(L) 31.7(L)  Platelets 150 - 400 K/uL 313 174 184   CMP Latest Ref Rng & Units 04/04/2021 03/26/2021 03/25/2021  Glucose 70 - 99 mg/dL 131(H)  126(H) 115(H)  BUN 6 - 20 mg/dL 83(H) 12 19  Creatinine 0.61 - 1.24 mg/dL 5.68(H) 1.15 1.50(H)  Sodium 135 - 145 mmol/L 125(L) 137 138  Potassium 3.5 - 5.1 mmol/L 4.7 4.7 4.6  Chloride 98 - 111 mmol/L 90(L) 109 107  CO2 22 - 32 mmol/L 16(L) 24 23  Calcium 8.9 - 10.3 mg/dL 9.3 8.4(L) 8.2(L)  Total Protein 6.5 - 8.1 g/dL 8.7(H) - 6.7  Total Bilirubin 0.3 - 1.2 mg/dL 0.7 - 0.6  Alkaline Phos 38 - 126 U/L 75 - 50  AST 15 - 41 U/L 21 - 36  ALT 0 - 44 U/L 26 - 25     Imaging studies:   CT Abdomen/Pelvis (04/04/2021) personally reviewed showing dilated stomach and small bowel leading to the ileostomy, decompress beyond this, no transition point identified prior to ileostomy, no free air, no abscess, and radiologist report reviewed below:  IMPRESSION: Right abdominal ileostomy. Small bowel leading into the ileostomy is dilated with air-fluid levels concerning for small bowel obstruction. Small bowel bleeding from the ileostomy to the terminal ileum is decompressed.   Aortic atherosclerosis.  KUB (04/04/2021) personally reviewed with NG in position, small bowel dilation improved, no longer with stomach dilation, and radiologist report reviewed below:  IMPRESSION: NG tube in the stomach.    Assessment/Plan:  56 y.o. male readmitted with significant dehydration and AKI with likely ileus vs edema at ileostomy (SBO is possible but he continues to have output from ileostomy and is without any transition point prior to ostomy) 13 days s/p robotic  assisted laparoscopic ultra low anterior resection with diverting loop ileostomy for sigmoid colon/mid-rectum malignancy   - Reviewed presentation with patient's wife and patient at bedside. It seems like at home, he was having ileostomy site discomfort with PO intake with associated bloating. He continued to have 3L of output a day. He was attempting to take imodium but only 0.5 - 1 tab daily as this would exacerbate his pain. He ultimately stopped with  PO intake, which likely lead to significant AKI. I suspect this may still represent ileus vs edema at ileostomy which is leading to bowel dilation/pain. He continues to have high output from this (>3L). I have low clinical suspicion for true SBO as there is no transition point prior to the ileostomy and he continues to have output. His WBC has normalized, he is without lactic acidosis, and has no tenderness this morning, so I doubt there is any bowel compromise either. His NGT output was documented at 1.3L but wife states he has only had ~300 ccs out and she has been by his side since presentation. He does feel thirsty and hungry this morning. I do think it may be reasonable to leave NGT clamped and allow him to trial CLD. He understands that should his N/V or distension recur we will reconnect this to LIS and leave in place for a day or two. He will need agressive resuscitation. Thankfully he is making urine, wife endorses around 500 ccs this morning. I do not think he warrants surgical intervention at this time; and additionally, this close to initial procedure, he would be at high risk for complication (fistula, bowel injury, etc...). Will will of course follow very closely.   All of the above findings and recommendations were discussed with the patient, patient's family, and the medical team, and all of patient's and family's questions were answered to their expressed satisfaction.  -- Edison Simon, PA-C [facility] 04/05/2021, 8:30 AM [phone] M-F: 7am - 4pm
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
## 2021-04-05 NOTE — Clinical Social Work Note (Signed)
°  Transition of Care [facility]) Screening Note   Patient Details  Name: Tracy Gardner Date of Birth: May 23, 1965   Transition of Care [facility]) CM/SW Contact:    Eileen Stanford, LCSW Phone [place] 04/05/2021, 1:25 PM    Transition of Care Department [facility]) has reviewed patient and no TOC needs have been identified at this time. We will continue to monitor patient advancement through interdisciplinary progression rounds. If new patient transition needs arise, please place a TOC consult.

## 2021-04-05 NOTE — Progress Notes (Signed)
PROGRESS NOTE    Tracy Gardner  TKW:[id number] DOB: 13-Mar-1965 DOA: 04/04/2021 PCP: [name], PA-C   Chief Complaint  Patient presents with   Shortness of Breath    Low oxygen per ems , 88%, from home , sob, colon cancer   Brief Narrative/Hospital Course: Tracy Gardner, 56 y.o. male with PMH of persistent asthma, cardiomyopathy, PAF, hypertension, history of traumatic right nephrectomy as a child, tobacco abuse, type 2 diabetes who had robotic assisted laparoscopic LAR with ultralow EEA and creation of diverting loop ileostomy by Dr. Dahlia Byes 1/12, with postoperative ileus that resolved on POD 3 returns to the ED with complaint of abdominal pain that persisted since the surgery. In the ED hemodynamically stable, CT abdomen pelvis showed right abdominal ileostomy, dilated air-fluid levels concerning for small bowel obstruction. Labs showed hyponatremia 125 BUN 83/creatinine 5.6 -Baseline creatinine BUN 12/1.1 on 1/15 stable CBC negative troponin and lactic acid. General surgery was consulted, patient was admitted for NG decompression for intermittent small bowel obstruction and high ileostomy output leading to AKI and dehydration.  Subjective: Seen and examined this morning family wife at bedside. Ileostomy output is getting thicker this morning, NG tube in place. Overnight afebrile, blood pressure stable Ilesotomy output 1300 ml  Assessment & Plan:  Small bowel obstruction High ileostomy output: Continue NGT decompression, supportive care IV fluid hydration Imodium as per general surgery who are  following closely, appreciate input.  Ileostomy output getting thicker.  Diet plan as per surgery, repeat x-ray pending  AKI Metabolic acidosis Dehydration History of right traumatic nephrectomy: AKI dehydration and metabolic acidosis in the setting of small bowel obstruction high ileostomy output.  Creatinine nicely downtrending but significantly elevated, continue aggressive IV  fluid hydration with Ringer's lactate, NG tube decompression supportive care monitor renal function. Recent Labs  Lab 04/04/21 1847 04/05/21 0823  BUN 83* 79*  CREATININE 5.68* 4.81*    Hypovolemic hyponatremia: Due to hypovolemia from above.  Improving, continue with IV fluid hydration follow-up labs Recent Labs  Lab 04/04/21 1847 04/05/21 0823  NA 125* 128*   Paroxysmal atrial fibrillation: Holding bisoprolol, diuretics.  On Eliquis but it has been on hold in the setting of recent surgery as well as port placement plan. continue amiodarone.    Mild persistent asthma:Continue Pulmicort, bronchodilators as needed  HLD- cont crestor  Diabetes mellitus type 2: Blood sugar is stable last A1c 6.1  Recent Labs  Lab 04/05/21 0022 04/05/21 0856  GLUCAP 106* 112*    Essential hypertension:bp stable.  OSA on CPAP  Class I Obesity:Patient's Body mass index is 31.06 kg/m. : Will benefit with PCP follow-up, weight loss  healthy lifestyle and outpatient sleep evaluation.  DVT prophylaxis: heparin injection 5,000 Units Start: 04/04/21 2245 SCDs Start: 04/04/21 2211 Code Status:   Code Status: Full Code Family Communication: plan of care discussed with patient and his wife at bedside. Status is: Inpatient Remains inpatient appropriate because: Ongoing management of small bowel obstruction AKI Disposition: Currently not medically stable for discharge. Anticipated Disposition: TBD  Total time spent in the care of this patient 50 MINUTES Objective: Vitals last 24 hrs: Vitals:   04/04/21 2357 04/05/21 0454 04/05/21 0800 04/05/21 0801  BP: 110/63 106/62 (!) 105/56   Pulse: 70 72 73   Resp: 16 18 18    Temp: 97.6 F (36.4 C) 98.8 F (37.1 C) 97.8 F (36.6 C)   TempSrc:  Oral Oral   SpO2: 100% 96% 97% 97%  Weight:  Height:       Weight change:   Intake/Output Summary (Last 24 hours) at 04/05/2021 1017 Last data filed at 04/05/2021 0945 Gross per 24 hour  Intake 907.07 ml   Output 1850 ml  Net -942.93 ml   Net IO Since Admission: -942.93 mL [04/05/21 1017]   Physical Examination: General exam: AA0x3, weak,older than stated age. HEENT:Oral mucosa moist, Ear/Nose WNL grossly,dentition normal. Respiratory system: B/l clear BS, no use of accessory muscle, non tender. Cardiovascular system: S1 & S2 +,No JVD. Gastrointestinal system: ngt+, Abdomen soft, NT,ND, BS+.  Ileostomy in place with  small amount of greenish thick stool output  Nervous System:Alert, awake, moving extremities. Extremities: edema none, distal peripheral pulses palpable.  Skin: No rashes, no icterus. MSK: Normal muscle bulk, tone, power.  Medications reviewed:  Scheduled Meds:  amiodarone  200 mg Per Tube Daily   budesonide (PULMICORT) nebulizer solution  0.25 mg Nebulization BID   heparin  5,000 Units Subcutaneous Q8H   insulin aspart  0-6 Units Subcutaneous TID WC   rosuvastatin  10 mg Per Tube Daily   Continuous Infusions:  albumin human     lactated ringers 125 mL/hr at 04/05/21 0955   Diet Order             Diet NPO time specified  Diet effective now                 Weight change:   Wt Readings from Last 3 Encounters:  04/04/21 101 kg  03/23/21 116.1 kg  03/17/21 116.1 kg     Consultants:see note  Procedures:see note Antimicrobials: Anti-infectives (From admission, onward)    None      Culture/Microbiology    Component Value Date/Time   SDES EXPECTORATED SPUTUM 07/07/2020 1228   SDES  07/07/2020 1228    EXPECTORATED SPUTUM Performed at [facility], [address] [place], [place] [address]    [facility] Normal 07/07/2020 1228   SPECREQUEST  07/07/2020 1228    Normal Reflexed from [id number] Performed at [facility], [address] [place], [place] [address]    CULT  07/07/2020 1228    FEW Normal respiratory flora-no Staph aureus or Pseudomonas seen Performed at [facility], [place] [address] [place], [place] [address]     REPTSTATUS 07/07/2020 FINAL 07/07/2020 1228   REPTSTATUS 07/10/2020 FINAL 07/07/2020 1228    Other culture-see note  Unresulted Labs (From admission, onward)     Start     Ordered   04/06/21 0998  Basic metabolic panel  Daily,   R     Question:  Specimen collection method  Answer:  Lab=Lab collect   04/05/21 0736   04/06/21 0500  CBC  Daily,   R     Question:  Specimen collection method  Answer:  Lab=Lab collect   04/05/21 0736          Data Reviewed: I have personally reviewed following labs and imaging studies CBC: Recent Labs  Lab 04/04/21 1847 04/05/21 0823  WBC 10.4 7.4  NEUTROABS 7.9*  --   HGB 14.5 12.0*  HCT 44.8 37.7*  MCV 73.8* 73.8*  PLT 313 338   Basic Metabolic Panel: Recent Labs  Lab 04/04/21 1847 04/05/21 0823  NA 125* 128*  K 4.7 4.2  CL 90* 98  CO2 16* 16*  GLUCOSE 131* 103*  BUN 83* 79*  CREATININE 5.68* 4.81*  CALCIUM 9.3 8.2*  MG  --  1.9   GFR: Estimated Creatinine Clearance:  21 mL/min (A) (by C-G formula based on SCr of 4.81 mg/dL (H)). Liver Function Tests: Recent Labs  Lab 04/04/21 1847 04/05/21 0823  AST 21 13*  ALT 26 21  ALKPHOS 75 54  BILITOT 0.7 0.4  PROT 8.7* 7.0  ALBUMIN 4.4 3.5   Recent Labs  Lab 04/04/21 1847  LIPASE 30   No results for input(s): AMMONIA in the last 168 hours. Coagulation Profile: No results for input(s): INR, PROTIME in the last 168 hours. Cardiac Enzymes: No results for input(s): CKTOTAL, CKMB, CKMBINDEX, TROPONINI in the last 168 hours. BNP (last 3 results) No results for input(s): PROBNP in the last 8760 hours. HbA1C: No results for input(s): HGBA1C in the last 72 hours. CBG: Recent Labs  Lab 04/05/21 0022 04/05/21 0856  GLUCAP 106* 112*   Lipid Profile: No results for input(s): CHOL, HDL, LDLCALC, TRIG, CHOLHDL, LDLDIRECT in the last 72 hours. Thyroid Function Tests: No results for input(s): TSH, T4TOTAL, FREET4, T3FREE, THYROIDAB in the last 72 hours. Anemia Panel: No  results for input(s): VITAMINB12, FOLATE, FERRITIN, TIBC, IRON, RETICCTPCT in the last 72 hours. Sepsis Labs: Recent Labs  Lab 04/04/21 1921 04/05/21 0019  LATICACIDVEN 1.5 1.3    Recent Results (from the past 240 hour(s))  Resp Panel by RT-PCR (Flu A&B, Covid) Nasopharyngeal Swab     Status: None   Collection Time: 04/04/21  7:21 PM   Specimen: Nasopharyngeal Swab; Nasopharyngeal(NP) swabs in vial transport medium  Result Value Ref Range Status   SARS Coronavirus 2 by RT PCR NEGATIVE NEGATIVE Final    Comment: (NOTE) SARS-CoV-2 target nucleic acids are NOT DETECTED.  The SARS-CoV-2 RNA is generally detectable in upper respiratory specimens during the acute phase of infection. The lowest concentration of SARS-CoV-2 viral copies this assay can detect is 138 copies/mL. A negative result does not preclude SARS-Cov-2 infection and should not be used as the sole basis for treatment or other patient management decisions. A negative result may occur with  improper specimen collection/handling, submission of specimen other than nasopharyngeal swab, presence of viral mutation(s) within the areas targeted by this assay, and inadequate number of viral copies(<138 copies/mL). A negative result must be combined with clinical observations, patient history, and epidemiological information. The expected result is Negative.  Fact Sheet for Patients:  EntrepreneurPulse.com.au  Fact Sheet for Healthcare Providers:  IncredibleEmployment.be  This test is no t yet approved or cleared by the [place] FDA and  has been authorized for detection and/or diagnosis of SARS-CoV-2 by FDA under an Emergency Use Authorization (EUA). This EUA will remain  in effect (meaning this test can be used) for the duration of the COVID-19 declaration under Section 564(b)(1) of the Act, 21 U.S.C.section 360bbb-3(b)(1), unless the authorization is terminated  or revoked sooner.        Influenza A by PCR NEGATIVE NEGATIVE Final   Influenza B by PCR NEGATIVE NEGATIVE Final    Comment: (NOTE) The Xpert Xpress SARS-CoV-2/FLU/RSV plus assay is intended as an aid in the diagnosis of influenza from Nasopharyngeal swab specimens and should not be used as a sole basis for treatment. Nasal washings and aspirates are unacceptable for Xpert Xpress SARS-CoV-2/FLU/RSV testing.  Fact Sheet for Patients: EntrepreneurPulse.com.au  Fact Sheet for Healthcare Providers: IncredibleEmployment.be  This test is not yet approved or cleared by the [place] FDA and has been authorized for detection and/or diagnosis of SARS-CoV-2 by FDA under an Emergency Use Authorization (EUA). This EUA will remain in effect (meaning this test can  be used) for the duration of the COVID-19 declaration under Section 564(b)(1) of the Act, 21 U.S.C. section 360bbb-3(b)(1), unless the authorization is terminated or revoked.  Performed at [facility], [address] [place], [place] [address]      Radiology Studies: CT ABDOMEN PELVIS WO CONTRAST  Result Date: 04/04/2021 CLINICAL DATA:  Shortness of breath. Colon cancer, recent ostomy. Bowel obstruction suspected EXAM: CT ABDOMEN AND PELVIS WITHOUT CONTRAST TECHNIQUE: Multidetector CT imaging of the abdomen and pelvis was performed following the standard protocol without IV contrast. RADIATION DOSE REDUCTION: This exam was performed according to the departmental dose-optimization program which includes automated exposure control, adjustment of the mA and/or kV according to patient size and/or use of iterative reconstruction technique. COMPARISON:  03/09/2021 FINDINGS: Lower chest: No acute abnormality Hepatobiliary: No focal hepatic abnormality. Gallbladder unremarkable. Pancreas: No focal abnormality or ductal dilatation. Spleen: No focal abnormality.  Normal size. Adrenals/Urinary Tract: Solitary left  kidney. No mass or hydronephrosis. Adrenal glands and urinary bladder unremarkable. Stomach/Bowel: Postoperative changes in the sigmoid colon. Right abdominal ostomy noted. Dilated small bowel loops with air-fluid levels. Distal small bowel loops are decompressed. Findings compatible with small bowel obstruction. This appears to be at the level of the right abdominal ileostomy. Stomach moderately distended. Vascular/Lymphatic: Aortic atherosclerosis. No evidence of aneurysm or adenopathy. Reproductive: No visible focal abnormality. Other: No free fluid or free air. Gas within the right lateral abdominal wall, presumably postoperative if surgery was recent. Musculoskeletal: No acute bony abnormality. IMPRESSION: Right abdominal ileostomy. Small bowel leading into the ileostomy is dilated with air-fluid levels concerning for small bowel obstruction. Small bowel bleeding from the ileostomy to the terminal ileum is decompressed. Aortic atherosclerosis. Electronically Signed   By: Rolm Baptise M.D.   On: 04/04/2021 20:49   DG Abdomen 1 View  Result Date: 04/04/2021 CLINICAL DATA:  NG tube placement EXAM: ABDOMEN - 1 VIEW COMPARISON:  04/04/2021 FINDINGS: NG tube is in the stomach. Mildly dilated small bowel loops. No free air. Visualized lung bases clear. IMPRESSION: NG tube in the stomach. Electronically Signed   By: Rolm Baptise M.D.   On: 04/04/2021 23:17   DG Chest Portable 1 View  Result Date: 04/04/2021 CLINICAL DATA:  Shortness of breath. EXAM: PORTABLE CHEST 1 VIEW COMPARISON:  Chest radiograph dated 03/07/2021 and CT dated 03/09/2021. FINDINGS: No focal consolidation, pleural effusion, or pneumothorax. Stable cardiac silhouette. No acute osseous pathology. IMPRESSION: No active disease. Electronically Signed   By: Anner Crete M.D.   On: 04/04/2021 19:18     LOS: 1 day   Antonieta Pert, MD [facility]  04/05/2021, 10:17 AM

## 2021-04-05 NOTE — Patient Outreach (Signed)
[facility]) Care Management  04/05/2021  Dquan Cortopassi Twining 1965/04/27 [id number]   Today's visit was not done. Pt is currently in the hospital.  Kayleen Memos C. Myrtie Neither, MSN, [facility] Gerontological Nurse Practitioner [facility] Care Management [phone]

## 2021-04-06 ENCOUNTER — Inpatient Hospital Stay: Admission: RE | Admit: 2021-04-06 | Payer: 59 | Source: Ambulatory Visit

## 2021-04-06 ENCOUNTER — Inpatient Hospital Stay: Payer: 59

## 2021-04-06 ENCOUNTER — Encounter: Payer: Self-pay | Admitting: *Deleted

## 2021-04-06 LAB — CBC
HCT: 31.9 % — ABNORMAL LOW (ref 39.0–52.0)
Hemoglobin: 10.2 g/dL — ABNORMAL LOW (ref 13.0–17.0)
MCH: 24.1 pg — ABNORMAL LOW (ref 26.0–34.0)
MCHC: 32 g/dL (ref 30.0–36.0)
MCV: 75.2 fL — ABNORMAL LOW (ref 80.0–100.0)
Platelets: 191 10*3/uL (ref 150–400)
RBC: 4.24 MIL/uL (ref 4.22–5.81)
RDW: 22 % — ABNORMAL HIGH (ref 11.5–15.5)
WBC: 5.3 10*3/uL (ref 4.0–10.5)
nRBC: 0 % (ref 0.0–0.2)

## 2021-04-06 LAB — GLUCOSE, CAPILLARY
Glucose-Capillary: 124 mg/dL — ABNORMAL HIGH (ref 70–99)
Glucose-Capillary: 100 mg/dL — ABNORMAL HIGH (ref 70–99)
Glucose-Capillary: 120 mg/dL — ABNORMAL HIGH (ref 70–99)
Glucose-Capillary: 126 mg/dL — ABNORMAL HIGH (ref 70–99)

## 2021-04-06 LAB — BASIC METABOLIC PANEL
Anion gap: 8 (ref 5–15)
BUN: 54 mg/dL — ABNORMAL HIGH (ref 6–20)
CO2: 20 mmol/L — ABNORMAL LOW (ref 22–32)
Calcium: 8.2 mg/dL — ABNORMAL LOW (ref 8.9–10.3)
Chloride: 103 mmol/L (ref 98–111)
Creatinine, Ser: 2.41 mg/dL — ABNORMAL HIGH (ref 0.61–1.24)
GFR, Estimated: 31 mL/min — ABNORMAL LOW (ref >60)
Glucose, Bld: 92 mg/dL (ref 70–99)
Potassium: 3.7 mmol/L (ref 3.5–5.1)
Sodium: 131 mmol/L — ABNORMAL LOW (ref 135–145)

## 2021-04-06 MED ORDER — ENSURE ENLIVE PO LIQD
237.0000 mL | Freq: Two times a day (BID) | ORAL | Status: DC
  Administered 2021-04-06 – 2021-04-07 (×2): 237 mL via ORAL

## 2021-04-06 MED ORDER — DOXYLAMINE SUCCINATE (SLEEP) 25 MG PO TABS
25.0000 mg | ORAL_TABLET | Freq: Every evening | ORAL | Status: DC | PRN
  Administered 2021-04-06: 25 mg via ORAL
  Filled 2021-04-06 (×3): qty 1

## 2021-04-06 NOTE — Progress Notes (Signed)
Patient resting at this time.

## 2021-04-06 NOTE — Consult Note (Addendum)
° °  [facility] [place] Inpatient Consult   04/06/2021  Tracy Gardner 11/19/1965 [id number]   [place] Organization [ACO] Patient: [name] plan  Primary Care Provider:  Cleophas Dunker, [facility]   Patient has been currently assigned to a [facility] Management for telephonic chronic disease management services.   Patient was discussed in [facility] Multidisciplinary Discussion call today due to less than 30 days readmission for continued support needs.    Plan: Patient will be followed by [facility] Coordinator. Continue to follow progress and transitional needs for post hospital support and update [facility] G-NP.  For additional questions or referrals please contact:   Natividad Brood, RN BSN [facility] Liaison  [phone] business mobile phone Toll free office [phone]  Fax number: [phone] [email or web address]

## 2021-04-06 NOTE — Patient Outreach (Signed)
[facility]) Care Management  04/06/2021  Treyshon Buchanon Vohra 1965/03/30 [id number]  [facility] Multidisciplinary team meeting today. Discussed Mr. Kwasnik health conditions and current status. NP to continue to follow weekly.  Eulah Pont. Myrtie Neither, MSN, [facility] Gerontological Nurse Practitioner [facility] Care Management [phone]

## 2021-04-06 NOTE — Progress Notes (Signed)
[facility] Hospital Day(s): 2.   Interval History:  Patient seen and examined No issues overnight This morning, he reports he is feeling much better. His biggest complaint is discomfort from the NGT No fever, chills, nausea, or emesis.  He remains without a leukocytosis this morning; 5.3K Hgb stable at 10.2 Renal function making improvements; sCr - 2.41; UO - 2.4L Hyponatremia improved to 131 KUB showing stable position of NGT; no stomach dilation, still with small bowel dilation He was started on CLD late yesterday; tolerating well Ileostomy with ~1 L out   Vital signs in last 24 hours: [min-max] current  Temp:  [97.6 F (36.4 C)-98.4 F (36.9 C)] 98.4 F (36.9 C) (01/26 0409) Pulse Rate:  [68-76] 76 (01/26 0409) Resp:  [14-20] 16 (01/26 0409) BP: (105-110)/(53-59) 107/53 (01/26 0409) SpO2:  [96 %-97 %] 96 % (01/26 0409)     Height: 5\' 11"  (180.3 cm) Weight: 101 kg BMI (Calculated): 31.07   Intake/Output last 2 shifts:  01/25 0701 - 01/26 0700 In: 727.7 [I.V.:631.5; IV Piggyback:96.2] Out: 6237 [Urine:2400; Stool:975]   Physical Exam:  Constitutional: alert, cooperative and no distress  HEENT: NGT in place; clamped (removed) Respiratory: breathing non-labored at rest  Cardiovascular: regular rate and sinus rhythm  Gastrointestinal: soft, non-tender, no distension this morning, he is tympanic expectedly,  no rebound/guarding, he is certainly without peritonitis. Ileostomy in the RLQ is intact, semisolid stool in bag, recently emptied. Integumentary: Laparoscopic incisions are healing well.   Labs:  CBC Latest Ref Rng & Units 04/06/2021 04/05/2021 04/04/2021  WBC 4.0 - 10.5 K/uL 5.3 7.4 10.4  Hemoglobin 13.0 - 17.0 g/dL 10.2(L) 12.0(L) 14.5  Hematocrit 39.0 - 52.0 % 31.9(L) 37.7(L) 44.8  Platelets 150 - 400 K/uL 191 252 313   CMP Latest Ref Rng & Units 04/06/2021 04/05/2021 04/04/2021  Glucose 70 - 99 mg/dL 92 103(H) 131(H)  BUN 6 -  20 mg/dL 54(H) 79(H) 83(H)  Creatinine 0.61 - 1.24 mg/dL 2.41(H) 4.81(H) 5.68(H)  Sodium 135 - 145 mmol/L 131(L) 128(L) 125(L)  Potassium 3.5 - 5.1 mmol/L 3.7 4.2 4.7  Chloride 98 - 111 mmol/L 103 98 90(L)  CO2 22 - 32 mmol/L 20(L) 16(L) 16(L)  Calcium 8.9 - 10.3 mg/dL 8.2(L) 8.2(L) 9.3  Total Protein 6.5 - 8.1 g/dL - 7.0 8.7(H)  Total Bilirubin 0.3 - 1.2 mg/dL - 0.4 0.7  Alkaline Phos 38 - 126 U/L - 54 75  AST 15 - 41 U/L - 13(L) 21  ALT 0 - 44 U/L - 21 26     Imaging studies:   KUB (04/06/2021) personally reviewed showing NGT in place, no stomach dilation, still with small bowel dilation and radiologist report pending    Assessment/Plan:  56 y.o. male readmitted with significant dehydration and AKI with likely ileus vs edema at ileostomy (SBO is possible but he continues to have output from ileostomy and is without any transition point prior to ostomy) 14 days s/p robotic assisted laparoscopic ultra low anterior resection with diverting loop ileostomy for sigmoid colon/mid-rectum malignancy   - He has clinically improved this morning. Will go ahead and remove NGT.  - Will trial on full liquids and encouraged him to go slow. Watch for distension, nausea, pain.  - Monitor ileostomy function.  - Continue IVF resuscitation; monitor renal function, which is improved.  - Mobilization encouraged  - No surgical intervention  - Further management per primary service; we will follow   All of the above findings  and recommendations were discussed with the patient, patient's family, and the medical team, and all of patient's and family's questions were answered to their expressed satisfaction.  -- Edison Simon, PA-C [facility] 04/06/2021, 7:57 AM [phone] M-F: 7am - 4pm

## 2021-04-06 NOTE — Progress Notes (Signed)
PROGRESS NOTE    Tracy Gardner  KNL:[id number] DOB: 09-08-1965 DOA: 04/04/2021 PCP: [name], PA-C   Chief Complaint  Patient presents with   Shortness of Breath    Low oxygen per ems , 88%, from home , sob, colon cancer   Brief Narrative/Hospital Course: Tracy Gardner, 56 y.o. male with PMH of persistent asthma, cardiomyopathy, PAF, hypertension, history of traumatic right nephrectomy as a child, tobacco abuse, type 2 diabetes who had robotic assisted laparoscopic LAR with ultralow EEA and creation of diverting loop ileostomy by Dr. Dahlia Byes 1/12, with postoperative ileus that resolved on POD 3 returns to the ED with complaint of abdominal pain that persisted since the surgery. In the ED hemodynamically stable, CT abdomen pelvis showed right abdominal ileostomy, dilated air-fluid levels concerning for small bowel obstruction. Labs showed hyponatremia 125 BUN 83/creatinine 5.6 -Baseline creatinine BUN 12/1.1 on 1/15 stable CBC negative troponin and lactic acid. General surgery was consulted, patient was admitted for NG decompression for intermittent small bowel obstruction and high ileostomy output leading to AKI and dehydration.  Subjective: Seen and examined.  Patient feeling fine NG tube is being removed by surgery wife at the bedside Overnight afebrile renal function nicely improving  936ml- ileostomy output On clear liquid  Assessment & Plan:  Small bowel obstruction High ileostomy output: Overall improving appreciate surgical input, continue supportive care fluid hydration, Imodium.  On clear liquid diet advance as tolerated as per surgery.  NG tube being removed this morning and diet is being advanced.  I will continue on IV fluid hydration next 24 hours.   AKI Metabolic acidosis Dehydration History of right traumatic nephrectomy: AKI dehydration and metabolic acidosis in the setting of small bowel obstruction and high ileostomy output.  Overall renal functions and  sodium nicely improving continue on IV fluid hydration.  Continue diet as tolerated currently on CLD advance as per surgery.   Recent Labs  Lab 04/04/21 1847 04/05/21 0823 04/06/21 0540  BUN 83* 79* 54*  CREATININE 5.68* 4.81* 2.41*    Hypovolemic hyponatremia: Due to hypovolemia from above.  Improving on IV fluids.  Recent Labs  Lab 04/04/21 1847 04/05/21 0823 04/06/21 0540  NA 125* 128* 131*   Paroxysmal atrial fibrillation: Holding bisoprolol, diuretics.  Home eliquis on hold in the setting of recent surgery as well as port placement plan by general surgery defer to general surgery when to resume it-again discussed this morning patient and family aware increased risk of stroke while Eliquis on hold but unfortunately he has a port placement coming soon so Eliquis remains on hold - he will follow-up with cardiology.continue amiodarone.    Mild persistent asthma: Mild wheezing, continue continue Pulmicort, bronchodilators as needed  HLD- on  crestor  Diabetes mellitus type 2: Blood sugar is stable last A1c 6.1 .  Monitor Recent Labs  Lab 04/05/21 0856 04/05/21 1148 04/05/21 1538 04/05/21 2114 04/06/21 0810  GLUCAP 112* 108* 97 147* 124*    Essential hypertension:bp well controlled OSA on CPAP Class I Obesity:Patient's Body mass index is 31.06 kg/m. : Will benefit with PCP follow-up, weight loss  healthy lifestyle.  DVT prophylaxis: heparin injection 5,000 Units Start: 04/04/21 2245 SCDs Start: 04/04/21 2211 Code Status:   Code Status: Full Code Family Communication: plan of care discussed with patient and his wife at bedside. Status is: Inpatient Remains inpatient appropriate because: Ongoing management of small bowel obstruction AKI Disposition: Currently not medically stable for discharge. Anticipated Disposition: home over the weekend  Total  time spent in the care of this patient 50 MINUTES Objective: Vitals last 24 hrs: Vitals:   04/05/21 2059 04/06/21 0409  04/06/21 0752 04/06/21 0810  BP:  (!) 107/53  122/63  Pulse:  76  73  Resp:  16  19  Temp:  98.4 F (36.9 C)  97.9 F (36.6 C)  TempSrc:  Oral  Oral  SpO2: 97% 96% 96% 96%  Weight:      Height:       Weight change:   Intake/Output Summary (Last 24 hours) at 04/06/2021 0953 Last data filed at 04/06/2021 0951 Gross per 24 hour  Intake 727.68 ml  Output 3475 ml  Net -2747.32 ml   Net IO Since Admission: -3,690.25 mL [04/06/21 0953]   Physical Examination: General exam: AAOx 3, obese, pleasant,weak appearing. HEENT:Oral mucosa moist, Ear/Nose WNL grossly, dentition normal. Respiratory system: bilaterally clear, no use of accessory muscle Cardiovascular system: S1 & S2 +, No JVD,. Gastrointestinal system: Abdomen soft,NT,ND, BS+ Nervous System:Alert, awake, moving extremities and grossly nonfocal Extremities: No edema, distal peripheral pulses palpable.  Skin: No rashes,no icterus. MSK: Normal muscle bulk,tone, power .  Medications reviewed:  Scheduled Meds:  amiodarone  200 mg Per Tube Daily   budesonide (PULMICORT) nebulizer solution  0.25 mg Nebulization BID   feeding supplement  237 mL Oral BID BM   heparin  5,000 Units Subcutaneous Q8H   insulin aspart  0-6 Units Subcutaneous TID WC   rosuvastatin  10 mg Per Tube Daily   Continuous Infusions:  lactated ringers 125 mL/hr at 04/06/21 0241   Diet Order             Diet full liquid Room service appropriate? Yes; Fluid consistency: Thin  Diet effective now                 Weight change:   Wt Readings from Last 3 Encounters:  04/04/21 101 kg  03/23/21 116.1 kg  03/17/21 116.1 kg     Consultants:see note  Procedures:see note Antimicrobials: Anti-infectives (From admission, onward)    None      Culture/Microbiology    Component Value Date/Time   SDES EXPECTORATED SPUTUM 07/07/2020 1228   SDES  07/07/2020 1228    EXPECTORATED SPUTUM Performed at [facility], [address] Madelaine Bhat  [place], [place] [address]    [facility] Normal 07/07/2020 1228   SPECREQUEST  07/07/2020 1228    Normal Reflexed from [id number] Performed at [facility], [address] [place], [place] [address]    CULT  07/07/2020 1228    FEW Normal respiratory flora-no Staph aureus or Pseudomonas seen Performed at [facility], [place] [address], [place],  [address]    REPTSTATUS 07/07/2020 FINAL 07/07/2020 1228   REPTSTATUS 07/10/2020 FINAL 07/07/2020 1228    Other culture-see note  Unresulted Labs (From admission, onward)     Start     Ordered   04/06/21 4010  Basic metabolic panel  Daily,   R     Question:  Specimen collection method  Answer:  Lab=Lab collect   04/05/21 0736   04/06/21 0500  CBC  Daily,   R     Question:  Specimen collection method  Answer:  Lab=Lab collect   04/05/21 0736          Data Reviewed: I have personally reviewed following labs and imaging studies CBC: Recent Labs  Lab 04/04/21 1847 04/05/21 0823 04/06/21 0540  WBC 10.4 7.4 5.3  NEUTROABS 7.9*  --   --  HGB 14.5 12.0* 10.2*  HCT 44.8 37.7* 31.9*  MCV 73.8* 73.8* 75.2*  PLT 313 252 989   Basic Metabolic Panel: Recent Labs  Lab 04/04/21 1847 04/05/21 0823 04/06/21 0540  NA 125* 128* 131*  K 4.7 4.2 3.7  CL 90* 98 103  CO2 16* 16* 20*  GLUCOSE 131* 103* 92  BUN 83* 79* 54*  CREATININE 5.68* 4.81* 2.41*  CALCIUM 9.3 8.2* 8.2*  MG  --  1.9  --    GFR: Estimated Creatinine Clearance: 41.9 mL/min (A) (by C-G formula based on SCr of 2.41 mg/dL (H)). Liver Function Tests: Recent Labs  Lab 04/04/21 1847 04/05/21 0823  AST 21 13*  ALT 26 21  ALKPHOS 75 54  BILITOT 0.7 0.4  PROT 8.7* 7.0  ALBUMIN 4.4 3.5   Recent Labs  Lab 04/04/21 1847  LIPASE 30   No results for input(s): AMMONIA in the last 168 hours. Coagulation Profile: No results for input(s): INR, PROTIME in the last 168 hours. Cardiac Enzymes: No results for input(s): CKTOTAL, CKMB, CKMBINDEX, TROPONINI in  the last 168 hours. BNP (last 3 results) No results for input(s): PROBNP in the last 8760 hours. HbA1C: No results for input(s): HGBA1C in the last 72 hours. CBG: Recent Labs  Lab 04/05/21 0856 04/05/21 1148 04/05/21 1538 04/05/21 2114 04/06/21 0810  GLUCAP 112* 108* 97 147* 124*   Lipid Profile: No results for input(s): CHOL, HDL, LDLCALC, TRIG, CHOLHDL, LDLDIRECT in the last 72 hours. Thyroid Function Tests: No results for input(s): TSH, T4TOTAL, FREET4, T3FREE, THYROIDAB in the last 72 hours. Anemia Panel: No results for input(s): VITAMINB12, FOLATE, FERRITIN, TIBC, IRON, RETICCTPCT in the last 72 hours. Sepsis Labs: Recent Labs  Lab 04/04/21 1921 04/05/21 0019  LATICACIDVEN 1.5 1.3    Recent Results (from the past 240 hour(s))  Resp Panel by RT-PCR (Flu A&B, Covid) Nasopharyngeal Swab     Status: None   Collection Time: 04/04/21  7:21 PM   Specimen: Nasopharyngeal Swab; Nasopharyngeal(NP) swabs in vial transport medium  Result Value Ref Range Status   SARS Coronavirus 2 by RT PCR NEGATIVE NEGATIVE Final    Comment: (NOTE) SARS-CoV-2 target nucleic acids are NOT DETECTED.  The SARS-CoV-2 RNA is generally detectable in upper respiratory specimens during the acute phase of infection. The lowest concentration of SARS-CoV-2 viral copies this assay can detect is 138 copies/mL. A negative result does not preclude SARS-Cov-2 infection and should not be used as the sole basis for treatment or other patient management decisions. A negative result may occur with  improper specimen collection/handling, submission of specimen other than nasopharyngeal swab, presence of viral mutation(s) within the areas targeted by this assay, and inadequate number of viral copies(<138 copies/mL). A negative result must be combined with clinical observations, patient history, and epidemiological information. The expected result is Negative.  Fact Sheet for Patients:   EntrepreneurPulse.com.au  Fact Sheet for Healthcare Providers:  IncredibleEmployment.be  This test is no t yet approved or cleared by the [place] FDA and  has been authorized for detection and/or diagnosis of SARS-CoV-2 by FDA under an Emergency Use Authorization (EUA). This EUA will remain  in effect (meaning this test can be used) for the duration of the COVID-19 declaration under Section 564(b)(1) of the Act, 21 U.S.C.section 360bbb-3(b)(1), unless the authorization is terminated  or revoked sooner.       Influenza A by PCR NEGATIVE NEGATIVE Final   Influenza B by PCR NEGATIVE NEGATIVE Final    Comment: (  NOTE) The Xpert Xpress SARS-CoV-2/FLU/RSV plus assay is intended as an aid in the diagnosis of influenza from Nasopharyngeal swab specimens and should not be used as a sole basis for treatment. Nasal washings and aspirates are unacceptable for Xpert Xpress SARS-CoV-2/FLU/RSV testing.  Fact Sheet for Patients: EntrepreneurPulse.com.au  Fact Sheet for Healthcare Providers: IncredibleEmployment.be  This test is not yet approved or cleared by the [place] FDA and has been authorized for detection and/or diagnosis of SARS-CoV-2 by FDA under an Emergency Use Authorization (EUA). This EUA will remain in effect (meaning this test can be used) for the duration of the COVID-19 declaration under Section 564(b)(1) of the Act, 21 U.S.C. section 360bbb-3(b)(1), unless the authorization is terminated or revoked.  Performed at [facility], [address], [place], [place] [address]      Radiology Studies: CT ABDOMEN PELVIS WO CONTRAST  Result Date: 04/04/2021 CLINICAL DATA:  Shortness of breath. Colon cancer, recent ostomy. Bowel obstruction suspected EXAM: CT ABDOMEN AND PELVIS WITHOUT CONTRAST TECHNIQUE: Multidetector CT imaging of the abdomen and pelvis was performed following the  standard protocol without IV contrast. RADIATION DOSE REDUCTION: This exam was performed according to the departmental dose-optimization program which includes automated exposure control, adjustment of the mA and/or kV according to patient size and/or use of iterative reconstruction technique. COMPARISON:  03/09/2021 FINDINGS: Lower chest: No acute abnormality Hepatobiliary: No focal hepatic abnormality. Gallbladder unremarkable. Pancreas: No focal abnormality or ductal dilatation. Spleen: No focal abnormality.  Normal size. Adrenals/Urinary Tract: Solitary left kidney. No mass or hydronephrosis. Adrenal glands and urinary bladder unremarkable. Stomach/Bowel: Postoperative changes in the sigmoid colon. Right abdominal ostomy noted. Dilated small bowel loops with air-fluid levels. Distal small bowel loops are decompressed. Findings compatible with small bowel obstruction. This appears to be at the level of the right abdominal ileostomy. Stomach moderately distended. Vascular/Lymphatic: Aortic atherosclerosis. No evidence of aneurysm or adenopathy. Reproductive: No visible focal abnormality. Other: No free fluid or free air. Gas within the right lateral abdominal wall, presumably postoperative if surgery was recent. Musculoskeletal: No acute bony abnormality. IMPRESSION: Right abdominal ileostomy. Small bowel leading into the ileostomy is dilated with air-fluid levels concerning for small bowel obstruction. Small bowel bleeding from the ileostomy to the terminal ileum is decompressed. Aortic atherosclerosis. Electronically Signed   By: Rolm Baptise M.D.   On: 04/04/2021 20:49   DG Abdomen 1 View  Result Date: 04/04/2021 CLINICAL DATA:  NG tube placement EXAM: ABDOMEN - 1 VIEW COMPARISON:  04/04/2021 FINDINGS: NG tube is in the stomach. Mildly dilated small bowel loops. No free air. Visualized lung bases clear. IMPRESSION: NG tube in the stomach. Electronically Signed   By: Rolm Baptise M.D.   On: 04/04/2021 23:17    DG Chest Portable 1 View  Result Date: 04/04/2021 CLINICAL DATA:  Shortness of breath. EXAM: PORTABLE CHEST 1 VIEW COMPARISON:  Chest radiograph dated 03/07/2021 and CT dated 03/09/2021. FINDINGS: No focal consolidation, pleural effusion, or pneumothorax. Stable cardiac silhouette. No acute osseous pathology. IMPRESSION: No active disease. Electronically Signed   By: Anner Crete M.D.   On: 04/04/2021 19:18   DG ABD ACUTE 2+V W 1V CHEST  Result Date: 04/05/2021 CLINICAL DATA:  Abdominal pain EXAM: DG ABDOMEN ACUTE WITH 1 VIEW CHEST COMPARISON:  Abdominal x-ray dated April 04, 2021 FINDINGS: Cardiac and mediastinal contours are within normal limits.Lungs are clear. No large pleural effusion or pneumothorax. Enteric tube tip and side port project over the proximal stomach. Mildly dilated small bowel loops which  are similar to prior exam. Soft tissue gas of the right chest wall, similar to prior exam. IMPRESSION: Enteric tube tip and side port project over the proximal stomach. Mildly dilated small bowel loops which are similar to prior exam. Electronically Signed   By: Yetta Glassman M.D.   On: 04/05/2021 11:29     LOS: 2 days   Antonieta Pert, MD [facility]  04/06/2021, 9:53 AM

## 2021-04-07 ENCOUNTER — Other Ambulatory Visit: Payer: Self-pay

## 2021-04-07 DIAGNOSIS — I959 Hypotension, unspecified: Secondary | ICD-10-CM | POA: Diagnosis not present

## 2021-04-07 DIAGNOSIS — E871 Hypo-osmolality and hyponatremia: Secondary | ICD-10-CM | POA: Diagnosis not present

## 2021-04-07 DIAGNOSIS — Z20822 Contact with and (suspected) exposure to covid-19: Secondary | ICD-10-CM | POA: Diagnosis not present

## 2021-04-07 DIAGNOSIS — E1122 Type 2 diabetes mellitus with diabetic chronic kidney disease: Secondary | ICD-10-CM | POA: Diagnosis not present

## 2021-04-07 DIAGNOSIS — K913 Postprocedural intestinal obstruction, unspecified as to partial versus complete: Secondary | ICD-10-CM | POA: Diagnosis not present

## 2021-04-07 DIAGNOSIS — E872 Acidosis, unspecified: Secondary | ICD-10-CM | POA: Diagnosis not present

## 2021-04-07 DIAGNOSIS — I429 Cardiomyopathy, unspecified: Secondary | ICD-10-CM | POA: Diagnosis not present

## 2021-04-07 DIAGNOSIS — N179 Acute kidney failure, unspecified: Secondary | ICD-10-CM | POA: Diagnosis not present

## 2021-04-07 DIAGNOSIS — I129 Hypertensive chronic kidney disease with stage 1 through stage 4 chronic kidney disease, or unspecified chronic kidney disease: Secondary | ICD-10-CM | POA: Diagnosis not present

## 2021-04-07 LAB — BASIC METABOLIC PANEL
Anion gap: 6 (ref 5–15)
BUN: 33 mg/dL — ABNORMAL HIGH (ref 6–20)
CO2: 23 mmol/L (ref 22–32)
Calcium: 8.2 mg/dL — ABNORMAL LOW (ref 8.9–10.3)
Chloride: 103 mmol/L (ref 98–111)
Creatinine, Ser: 1.12 mg/dL (ref 0.61–1.24)
GFR, Estimated: >60 mL/min (ref >60)
Glucose, Bld: 93 mg/dL (ref 70–99)
Potassium: 3.9 mmol/L (ref 3.5–5.1)
Sodium: 132 mmol/L — ABNORMAL LOW (ref 135–145)

## 2021-04-07 LAB — GLUCOSE, CAPILLARY
Glucose-Capillary: 151 mg/dL — ABNORMAL HIGH (ref 70–99)
Glucose-Capillary: 135 mg/dL — ABNORMAL HIGH (ref 70–99)

## 2021-04-07 MED ORDER — LOPERAMIDE HCL 2 MG PO TABS
2.0000 mg | ORAL_TABLET | Freq: Four times a day (QID) | ORAL | 0 refills | Status: DC | PRN
  Filled 2021-04-07: qty 30, 8d supply, fill #0

## 2021-04-07 NOTE — Progress Notes (Signed)
[facility] Hospital Day(s): 3.   Interval History:  Patient seen and examined No issues overnight This morning, he is feeling much better. Very thankful.  No abdominal pain or distension No fever, chills, nausea, or emesis.  He remains without a leukocytosis this morning; 5.3K Hgb stable at 10.2 Renal function has normalized; sCr - 1.12; UO - 1.4L He has tolerated full liquids; advanced to regular diet this morning  Ileostomy with 1.8 L out   His biggest question is whether or not he can have port placed this admission.   Vital signs in last 24 hours: [min-max] current  Temp:  [97.8 F (36.6 C)-98.5 F (36.9 C)] 97.8 F (36.6 C) (01/27 0430) Pulse Rate:  [57-73] 57 (01/27 0430) Resp:  [16-20] 20 (01/27 0430) BP: (102-122)/(57-63) 102/59 (01/27 0430) SpO2:  [96 %-98 %] 96 % (01/27 0430)     Height: 5\' 11"  (180.3 cm) Weight: 101 kg BMI (Calculated): 31.07   Intake/Output last 2 shifts:  01/26 0701 - 01/27 0700 In: 2985 [P.O.:2985] Out: 5462 [Urine:1400; Emesis/NG output:400; VOJJK:0938]   Physical Exam:  Constitutional: alert, cooperative and no distress  Respiratory: breathing non-labored at rest  Cardiovascular: regular rate and sinus rhythm  Gastrointestinal: soft, non-tender, no distension this morning, he is tympanic expectedly,  no rebound/guarding, he is certainly without peritonitis. Ileostomy in the RLQ is intact, semisolid stool in bag, recently emptied. Integumentary: Laparoscopic incisions are healing well.   Labs:  CBC Latest Ref Rng & Units 04/06/2021 04/05/2021 04/04/2021  WBC 4.0 - 10.5 K/uL 5.3 7.4 10.4  Hemoglobin 13.0 - 17.0 g/dL 10.2(L) 12.0(L) 14.5  Hematocrit 39.0 - 52.0 % 31.9(L) 37.7(L) 44.8  Platelets 150 - 400 K/uL 191 252 313   CMP Latest Ref Rng & Units 04/07/2021 04/06/2021 04/05/2021  Glucose 70 - 99 mg/dL 93 92 103(H)  BUN 6 - 20 mg/dL 33(H) 54(H) 79(H)  Creatinine 0.61 - 1.24 mg/dL 1.12 2.41(H)  4.81(H)  Sodium 135 - 145 mmol/L 132(L) 131(L) 128(L)  Potassium 3.5 - 5.1 mmol/L 3.9 3.7 4.2  Chloride 98 - 111 mmol/L 103 103 98  CO2 22 - 32 mmol/L 23 20(L) 16(L)  Calcium 8.9 - 10.3 mg/dL 8.2(L) 8.2(L) 8.2(L)  Total Protein 6.5 - 8.1 g/dL - - 7.0  Total Bilirubin 0.3 - 1.2 mg/dL - - 0.4  Alkaline Phos 38 - 126 U/L - - 54  AST 15 - 41 U/L - - 13(L)  ALT 0 - 44 U/L - - 21     Imaging studies: No new pertinent imaging studies  Assessment/Plan:  56 y.o. male readmitted with significant dehydration and AKI with likely ileus vs edema at ileostomy (SBO is possible but he continues to have output from ileostomy and is without any transition point prior to ostomy) 14 days s/p robotic assisted laparoscopic ultra low anterior resection with diverting loop ileostomy for sigmoid colon/mid-rectum malignancy   - In regards to his port placement, it is a reasonable request to determine if he can have this placed this admission. For now, the biggest barrier is OR availability. Currently planned for 02/02. Will defer to Dr Dahlia Byes for timing.    - He has clinically improved; now on regular diet and tolerating well with ileostomy function - Monitor ileostomy function; may need to resume imodium at some point moving forward if output >1.5L consistently  - Renal function normalized; can DC fluids  - Mobilization encouraged  - No surgical intervention  - Further management per  primary service   - Discharge Planning; Pending discussion on port placement and timing, he may be able to go home this afternoon.   All of the above findings and recommendations were discussed with the patient, patient's family, and the medical team, and all of patient's and family's questions were answered to their expressed satisfaction.  -- Edison Simon, PA-C [facility] 04/07/2021, 7:46 AM [phone] M-F: 7am - 4pm

## 2021-04-07 NOTE — Progress Notes (Signed)
Cross Cover Patient admitted with SBO complication of sigmoid colectomy done for colon adenocarcinoma.  Patient is followed/treated with [facility] oncology Dr Rogue Bussing. Plan to treat his metastatic adenocarcinoma is chemotherapy per outpatient note. Patient's family requesting coordination for port placement to be done while patient is here. Dr. Janese Banks on call for Dr Rogue Bussing and consult placed so they may round, coordinate care/ procedures while here

## 2021-04-07 NOTE — Discharge Summary (Signed)
Physician Discharge Summary  Tracy Gardner FXT:[id number] DOB: [birth year] DOA: 04/04/2021  PCP: Tracy Rossetti, PA-C  Admit date: 04/04/2021 Discharge date: 04/07/2021  Admitted From: home Disposition:  home  Recommendations for Outpatient Follow-up:  Follow up with PCP in 1-2 weeks Please obtain BMP/CBC in one week Please follow up on the following pending results:  Home Health:no  Equipment/Devices: none  Discharge Condition: Stable Code Status:   Code Status: Full Code Diet recommendation:  Diet Order             Diet heart healthy/carb modified Room service appropriate? Yes; Fluid consistency: Thin  Diet effective now                    Brief/Interim Summary:  56 y.o. male with PMH of persistent asthma, cardiomyopathy, PAF, hypertension, history of traumatic right nephrectomy as a child, tobacco abuse, type 2 diabetes who had robotic assisted laparoscopic LAR with ultralow EEA and creation of diverting loop ileostomy by Dr. Dahlia Gardner 1/12, with postoperative ileus that resolved on POD 3 returns to the ED with complaint of abdominal pain that persisted since the surgery. In the ED hemodynamically stable, CT abdomen pelvis showed right abdominal ileostomy, dilated air-fluid levels concerning for small bowel obstruction. Labs showed hyponatremia 125 BUN 83/creatinine 5.6 -Baseline creatinine BUN 12/1.1 on 1/15 stable CBC negative troponin and lactic acid. General surgery was consulted, patient was admitted for NG decompression for intermittent small bowel obstruction and high ileostomy output leading to AKI and dehydration. Patient now has clinically improved tolerating diet AKI has resolved.  IV fluids discontinued.  Seen by surgery and okay to discharge home.  He will follow-up next week for port placement.   Discharge Diagnoses:   Small bowel obstruction High ileostomy output: Overall improved.off NG tube.  Tolerating diet.  Cleared for discharge by general surgery.   Continue Imodium as needed.  Outpatient follow-up with general surgery.     AKI Metabolic acidosis Dehydration History of right traumatic nephrectomy: Now resolved.  Off IV fluids tolerating diet.  Outpatient follow-up with PCP   Hypovolemic hyponatremia resolved  PAF: Rate controlled continue home bisoprolol diuretics.  Eliquis has been on hold for his port placement.   Mild persistent asthma: Stable.continue home inhalers and bronchodilators.   HLD- on  crestor Diabetes mellitus type 2: Blood sugar is stable last A1c 6.1 .  Monitor Essential hypertension:bp well controlled OSA on CPAP Class I Obesity:Patient's Body mass index is 31.06 kg/m. : Will benefit with PCP follow-up, weight loss  healthy lifestyle.  Consults: General surgeon  Subjective: Alert, eager to go home.  Tolerating diet. Discharge Exam: Vitals:   04/07/21 0747 04/07/21 0812  BP:  (!) 93/46  Pulse:  64  Resp:  18  Temp:  97.8 F (36.6 C)  SpO2: 95% 98%   General: Pt is alert, awake, not in acute distress Cardiovascular: RRR, S1/S2 +, no rubs, no gallops Respiratory: CTA bilaterally, no wheezing, no rhonchi Abdominal: Soft, NT, ND, bowel sounds +, colostomy in place.   Extremities: no edema, no cyanosis  Discharge Instructions  Discharge Instructions     Discharge instructions   Complete by: As directed    Continue to hold her Eliquis for a port placement-discuss with your cardiology and general surgery  Please call call MD or return to ER for similar or worsening recurring problem that brought you to hospital or if any fever,nausea/vomiting,abdominal pain, uncontrolled pain, chest pain,  shortness of breath or any other alarming symptoms.  Please follow-up your doctor as instructed in a week time and call the office for appointment.  Please avoid alcohol, smoking, or any other illicit substance and maintain healthy habits including taking your regular medications as prescribed.  You were cared  for by a hospitalist during your hospital stay. If you have any questions about your discharge medications or the care you received while you were in the hospital after you are discharged, you can call the unit and ask to speak with the hospitalist on call if the hospitalist that took care of you is not available.  Once you are discharged, your primary care physician will handle any further medical issues. Please note that NO REFILLS for any discharge medications will be authorized once you are discharged, as it is imperative that you return to your primary care physician (or establish a relationship with a primary care physician if you do not have one) for your aftercare needs so that they can reassess your need for medications and monitor your lab values   Increase activity slowly   Complete by: As directed    No wound care   Complete by: As directed       Allergies as of 04/07/2021   No Known Allergies      Medication List     TAKE these medications    albuterol 108 (90 Base) MCG/ACT inhaler Commonly known as: VENTOLIN HFA Inhale 2 puffs into the lungs every 6 (six) hours as needed for wheezing or shortness of breath.   amiodarone 200 MG tablet Commonly known as: PACERONE Take 1 tablet (200 mg total) by mouth daily. Hold for HR less then 50   apixaban 5 MG Tabs tablet Commonly known as: ELIQUIS Take 5 mg by mouth 2 (two) times daily.   bisoprolol 5 MG tablet Commonly known as: ZEBETA Take 0.5 tablets (2.5 mg total) by mouth daily. What changed: additional instructions   cetirizine 10 MG tablet Commonly known as: ZYRTEC Take 10 mg by mouth daily.   doxylamine (Sleep) 25 MG tablet Commonly known as: UNISOM Take 25 mg by mouth at bedtime.   Flovent HFA 110 MCG/ACT inhaler Generic drug: fluticasone Inhale into the lungs 2 (two) times daily. What changed:  how much to take when to take this reasons to take this   furosemide 20 MG tablet Commonly known as:  LASIX Take 10 mg by mouth daily.   ibuprofen 600 MG tablet Commonly known as: ADVIL Take 1 tablet (600 mg total) by mouth every 6 (six) hours as needed.   Jardiance 10 MG Tabs tablet Generic drug: empagliflozin Take 1 tablet (10 mg total) by mouth daily.   loperamide 2 MG tablet Commonly known as: Imodium A-D Take 1 tablet (2 mg total) by mouth 4 (four) times daily as needed for diarrhea or loose stools.   multivitamin with minerals tablet Take 1 tablet by mouth daily.   omeprazole 20 MG capsule Commonly known as: PRILOSEC Take 1 capsule (20 mg total) by mouth 2 (two) times daily for 14 days. What changed: when to take this   ondansetron 4 MG tablet Commonly known as: Zofran Take 1 tablet (4 mg total) by mouth every 8 (eight) hours as needed for nausea or vomiting.   oxyCODONE 5 MG immediate release tablet Commonly known as: Oxy IR/ROXICODONE Take 1-2 tablets (5-10 mg total) by mouth every 4 (four) hours as needed for moderate pain.   rosuvastatin 10 MG tablet Commonly known as: CRESTOR Take 1 tablet (10 mg  total) by mouth daily. What changed: when to take this        Follow-up Information     Whitaker, Corene Cornea Hestle, PA-C Follow up in 1 week(s).   Specialty: Family Medicine Contact information: [place] [address] [phone]                No Known Allergies  The results of significant diagnostics from this hospitalization (including imaging, microbiology, ancillary and laboratory) are listed below for reference.    Microbiology: Recent Results (from the past 240 hour(s))  Resp Panel by RT-PCR (Flu A&B, Covid) Nasopharyngeal Swab     Status: None   Collection Time: 04/04/21  7:21 PM   Specimen: Nasopharyngeal Swab; Nasopharyngeal(NP) swabs in vial transport medium  Result Value Ref Range Status   SARS Coronavirus 2 by RT PCR NEGATIVE NEGATIVE Final    Comment: (NOTE) SARS-CoV-2 target nucleic acids are NOT DETECTED.  The  SARS-CoV-2 RNA is generally detectable in upper respiratory specimens during the acute phase of infection. The lowest concentration of SARS-CoV-2 viral copies this assay can detect is 138 copies/mL. A negative result does not preclude SARS-Cov-2 infection and should not be used as the sole basis for treatment or other patient management decisions. A negative result may occur with  improper specimen collection/handling, submission of specimen other than nasopharyngeal swab, presence of viral mutation(s) within the areas targeted by this assay, and inadequate number of viral copies(<138 copies/mL). A negative result must be combined with clinical observations, patient history, and epidemiological information. The expected result is Negative.  Fact Sheet for Patients:  EntrepreneurPulse.com.au  Fact Sheet for Healthcare Providers:  IncredibleEmployment.be  This test is no t yet approved or cleared by the [place] FDA and  has been authorized for detection and/or diagnosis of SARS-CoV-2 by FDA under an Emergency Use Authorization (EUA). This EUA will remain  in effect (meaning this test can be used) for the duration of the COVID-19 declaration under Section 564(b)(1) of the Act, 21 U.S.C.section 360bbb-3(b)(1), unless the authorization is terminated  or revoked sooner.       Influenza A by PCR NEGATIVE NEGATIVE Final   Influenza B by PCR NEGATIVE NEGATIVE Final    Comment: (NOTE) The Xpert Xpress SARS-CoV-2/FLU/RSV plus assay is intended as an aid in the diagnosis of influenza from Nasopharyngeal swab specimens and should not be used as a sole basis for treatment. Nasal washings and aspirates are unacceptable for Xpert Xpress SARS-CoV-2/FLU/RSV testing.  Fact Sheet for Patients: EntrepreneurPulse.com.au  Fact Sheet for Healthcare Providers: IncredibleEmployment.be  This test is not yet approved or  cleared by the [place] FDA and has been authorized for detection and/or diagnosis of SARS-CoV-2 by FDA under an Emergency Use Authorization (EUA). This EUA will remain in effect (meaning this test can be used) for the duration of the COVID-19 declaration under Section 564(b)(1) of the Act, 21 U.S.C. section 360bbb-3(b)(1), unless the authorization is terminated or revoked.  Performed at [facility], [address] [place], [place] [address]     Procedures/Studies: CT ABDOMEN PELVIS WO CONTRAST  Result Date: 04/04/2021 CLINICAL DATA:  Shortness of breath. Colon cancer, recent ostomy. Bowel obstruction suspected EXAM: CT ABDOMEN AND PELVIS WITHOUT CONTRAST TECHNIQUE: Multidetector CT imaging of the abdomen and pelvis was performed following the standard protocol without IV contrast. RADIATION DOSE REDUCTION: This exam was performed according to the departmental dose-optimization program which includes automated exposure control, adjustment of the mA and/or kV according to patient  size and/or use of iterative reconstruction technique. COMPARISON:  03/09/2021 FINDINGS: Lower chest: No acute abnormality Hepatobiliary: No focal hepatic abnormality. Gallbladder unremarkable. Pancreas: No focal abnormality or ductal dilatation. Spleen: No focal abnormality.  Normal size. Adrenals/Urinary Tract: Solitary left kidney. No mass or hydronephrosis. Adrenal glands and urinary bladder unremarkable. Stomach/Bowel: Postoperative changes in the sigmoid colon. Right abdominal ostomy noted. Dilated small bowel loops with air-fluid levels. Distal small bowel loops are decompressed. Findings compatible with small bowel obstruction. This appears to be at the level of the right abdominal ileostomy. Stomach moderately distended. Vascular/Lymphatic: Aortic atherosclerosis. No evidence of aneurysm or adenopathy. Reproductive: No visible focal abnormality. Other: No free fluid or free air. Gas within the right  lateral abdominal wall, presumably postoperative if surgery was recent. Musculoskeletal: No acute bony abnormality. IMPRESSION: Right abdominal ileostomy. Small bowel leading into the ileostomy is dilated with air-fluid levels concerning for small bowel obstruction. Small bowel bleeding from the ileostomy to the terminal ileum is decompressed. Aortic atherosclerosis. Electronically Signed   By: Rolm Baptise M.D.   On: 04/04/2021 20:49   DG Abdomen 1 View  Result Date: 04/04/2021 CLINICAL DATA:  NG tube placement EXAM: ABDOMEN - 1 VIEW COMPARISON:  04/04/2021 FINDINGS: NG tube is in the stomach. Mildly dilated small bowel loops. No free air. Visualized lung bases clear. IMPRESSION: NG tube in the stomach. Electronically Signed   By: Rolm Baptise M.D.   On: 04/04/2021 23:17   CT CHEST ABDOMEN PELVIS W CONTRAST  Result Date: 03/09/2021 CLINICAL DATA:  Metastatic colon cancer. Sigmoid colon mass. Staging. EXAM: CT CHEST, ABDOMEN, AND PELVIS WITH CONTRAST TECHNIQUE: Multidetector CT imaging of the chest, abdomen and pelvis was performed following the standard protocol during bolus administration of intravenous contrast. CONTRAST:  166mL OMNIPAQUE IOHEXOL 300 MG/ML  SOLN COMPARISON:  Chest CT dated 07/07/2020. FINDINGS: CT CHEST FINDINGS Cardiovascular: There is no cardiomegaly or pericardial effusion. Three-vessel coronary vascular calcification. The thoracic aorta is unremarkable. The origins of the great vessels of the aortic arch and the central pulmonary arteries appear patent. Mediastinum/Nodes: Mildly enlarged right hilar lymph node measures 11 mm in short axis similar to prior CT. Subcarinal lymph node measures 13 mm short axis similar to prior CT. A rounded anterior mediastinal lymph nodes measures 8 mm (19/2). The esophagus is grossly unremarkable. No mediastinal fluid collection. Lungs/Pleura: There is diffuse interstitial and interlobular septal prominence and hazy and ground-glass density throughout  the lungs most consistent with edema. Pneumonia is not excluded. Clinical correlation is recommended. Trace bilateral pleural effusions. Minimal bibasilar atelectasis. No lobar consolidation or pneumothorax. The central airways are patent. There is a 5 mm left upper lobe subpleural nodule (69/5). Musculoskeletal: Degenerative changes of the spine. No acute osseous pathology. CT ABDOMEN PELVIS FINDINGS No intra-abdominal free air or free fluid. Hepatobiliary: Subcentimeter hypodense focus in the dome of the liver (53/2) is not characterized. The liver is otherwise unremarkable. No intrahepatic biliary dilatation. The gallbladder is unremarkable. Pancreas: Unremarkable. No pancreatic ductal dilatation or surrounding inflammatory changes. Spleen: Normal in size without focal abnormality. Adrenals/Urinary Tract: The adrenal glands are unremarkable. There is a solitary left kidney. There is no hydronephrosis. The left ureter and urinary bladder appear unremarkable. Stomach/Bowel: Mild thickened appearance of the wall of the rectal vault (113/2) which may be related to underdistention. Underlying mass is not excluded. There are scattered sigmoid diverticula without active inflammatory changes. There is no bowel obstruction or active inflammation. The appendix is normal. Vascular/Lymphatic: Mild aortoiliac atherosclerotic disease. The IVC  is unremarkable. No portal venous gas. No adenopathy. Reproductive: The prostate and seminal vesicles are grossly unremarkable. No pelvic mass. Other: None Musculoskeletal: Degenerative changes of the spine and hips. No acute osseous pathology. IMPRESSION: 1. No evidence of metastatic disease in the chest, abdomen, or pelvis. 2. Mild thickened appearance of the wall of the rectal vault may be related to underdistention. Underlying mass is not excluded. 3. Pulmonary edema with trace bilateral pleural effusions. 4. Solitary left kidney. 5. Sigmoid diverticulosis. 6. A 5 mm left upper lobe  subpleural nodule. 7. Aortic Atherosclerosis (ICD10-I70.0). Electronically Signed   By: Anner Crete M.D.   On: 03/09/2021 19:37   DG Chest Portable 1 View  Result Date: 04/04/2021 CLINICAL DATA:  Shortness of breath. EXAM: PORTABLE CHEST 1 VIEW COMPARISON:  Chest radiograph dated 03/07/2021 and CT dated 03/09/2021. FINDINGS: No focal consolidation, pleural effusion, or pneumothorax. Stable cardiac silhouette. No acute osseous pathology. IMPRESSION: No active disease. Electronically Signed   By: Anner Crete M.D.   On: 04/04/2021 19:18   DG Abd 2 Views  Result Date: 03/25/2021 CLINICAL DATA:  Abdominal pain and distention EXAM: ABDOMEN - 2 VIEW COMPARISON:  CT done on 03/09/2021 FINDINGS: There is dilation of small-bowel loops measuring up to 6.1 cm in diameter. Stomach is not distended. There is moderate distention of colon. There is linear tubular structure in the right side of pelvis. There are pockets of air in the subcutaneous plane in the abdominal wall, possibly related to recent surgery. IMPRESSION: There is abnormal dilation of small-bowel loops. There is moderate gaseous distention of proximal colon. Findings suggest ileus or distal colonic obstruction. There is a tubular structure in the right side of pelvis which may suggest a surgical drain or foreign body. Please correlate with clinical history. Electronically Signed   By: Elmer Picker M.D.   On: 03/25/2021 08:38   DG ABD ACUTE 2+V W 1V CHEST  Result Date: 04/06/2021 CLINICAL DATA:  Follow-up small bowel obstruction. EXAM: DG ABDOMEN ACUTE WITH 1 VIEW CHEST COMPARISON:  04/05/2021 FINDINGS: Nasogastric tube tip and side hole in the proximal stomach. Normal sized heart. Clear lungs. Thoracic spine degenerative changes. Multiple dilated loops of small bowel with mild progression. These contain multiple air-fluid levels. A right lower quadrant ostomy is noted. Lumbar spine degenerative changes. IMPRESSION: Mildly progressive  small-bowel obstruction or ileus. Electronically Signed   By: Claudie Revering M.D.   On: 04/06/2021 10:10   DG ABD ACUTE 2+V W 1V CHEST  Result Date: 04/05/2021 CLINICAL DATA:  Abdominal pain EXAM: DG ABDOMEN ACUTE WITH 1 VIEW CHEST COMPARISON:  Abdominal x-ray dated April 04, 2021 FINDINGS: Cardiac and mediastinal contours are within normal limits.Lungs are clear. No large pleural effusion or pneumothorax. Enteric tube tip and side port project over the proximal stomach. Mildly dilated small bowel loops which are similar to prior exam. Soft tissue gas of the right chest wall, similar to prior exam. IMPRESSION: Enteric tube tip and side port project over the proximal stomach. Mildly dilated small bowel loops which are similar to prior exam. Electronically Signed   By: Yetta Glassman M.D.   On: 04/05/2021 11:29    Labs: BNP (last 3 results) Recent Labs    07/07/20 1010 03/07/21 1623  BNP 240.2* 735.3*   Basic Metabolic Panel: Recent Labs  Lab 04/04/21 1847 04/05/21 0823 04/06/21 0540 04/07/21 0536  NA 125* 128* 131* 132*  K 4.7 4.2 3.7 3.9  CL 90* 98 103 103  CO2 16* 16* 20*  23  GLUCOSE 131* 103* 92 93  BUN 83* 79* 54* 33*  CREATININE 5.68* 4.81* 2.41* 1.12  CALCIUM 9.3 8.2* 8.2* 8.2*  MG  --  1.9  --   --    Liver Function Tests: Recent Labs  Lab 04/04/21 1847 04/05/21 0823  AST 21 13*  ALT 26 21  ALKPHOS 75 54  BILITOT 0.7 0.4  PROT 8.7* 7.0  ALBUMIN 4.4 3.5   Recent Labs  Lab 04/04/21 1847  LIPASE 30   No results for input(s): AMMONIA in the last 168 hours. CBC: Recent Labs  Lab 04/04/21 1847 04/05/21 0823 04/06/21 0540  WBC 10.4 7.4 5.3  NEUTROABS 7.9*  --   --   HGB 14.5 12.0* 10.2*  HCT 44.8 37.7* 31.9*  MCV 73.8* 73.8* 75.2*  PLT 313 252 191   Cardiac Enzymes: No results for input(s): CKTOTAL, CKMB, CKMBINDEX, TROPONINI in the last 168 hours. BNP: Invalid input(s): POCBNP CBG: Recent Labs  Lab 04/06/21 1134 04/06/21 1637 04/06/21 2142  04/07/21 0813 04/07/21 1156  GLUCAP 100* 120* 126* 151* 135*   D-Dimer No results for input(s): DDIMER in the last 72 hours. Hgb A1c No results for input(s): HGBA1C in the last 72 hours. Lipid Profile No results for input(s): CHOL, HDL, LDLCALC, TRIG, CHOLHDL, LDLDIRECT in the last 72 hours. Thyroid function studies No results for input(s): TSH, T4TOTAL, T3FREE, THYROIDAB in the last 72 hours.  Invalid input(s): FREET3 Anemia work up No results for input(s): VITAMINB12, FOLATE, FERRITIN, TIBC, IRON, RETICCTPCT in the last 72 hours. Urinalysis    Component Value Date/Time   COLORURINE Yellow 01/24/2013 1607   APPEARANCEUR Clear 01/24/2013 1607   LABSPEC 1.033 01/24/2013 1607   PHURINE 5.0 01/24/2013 1607   GLUCOSEU Negative 01/24/2013 1607   HGBUR Negative 01/24/2013 1607   BILIRUBINUR Negative 01/24/2013 1607   KETONESUR Negative 01/24/2013 1607   PROTEINUR >=500 01/24/2013 1607   NITRITE Negative 01/24/2013 1607   LEUKOCYTESUR Negative 01/24/2013 1607   Sepsis Labs Invalid input(s): PROCALCITONIN,  WBC,  LACTICIDVEN Microbiology Recent Results (from the past 240 hour(s))  Resp Panel by RT-PCR (Flu A&B, Covid) Nasopharyngeal Swab     Status: None   Collection Time: 04/04/21  7:21 PM   Specimen: Nasopharyngeal Swab; Nasopharyngeal(NP) swabs in vial transport medium  Result Value Ref Range Status   SARS Coronavirus 2 by RT PCR NEGATIVE NEGATIVE Final    Comment: (NOTE) SARS-CoV-2 target nucleic acids are NOT DETECTED.  The SARS-CoV-2 RNA is generally detectable in upper respiratory specimens during the acute phase of infection. The lowest concentration of SARS-CoV-2 viral copies this assay can detect is 138 copies/mL. A negative result does not preclude SARS-Cov-2 infection and should not be used as the sole basis for treatment or other patient management decisions. A negative result may occur with  improper specimen collection/handling, submission of specimen  other than nasopharyngeal swab, presence of viral mutation(s) within the areas targeted by this assay, and inadequate number of viral copies(<138 copies/mL). A negative result must be combined with clinical observations, patient history, and epidemiological information. The expected result is Negative.  Fact Sheet for Patients:  EntrepreneurPulse.com.au  Fact Sheet for Healthcare Providers:  IncredibleEmployment.be  This test is no t yet approved or cleared by the [place] FDA and  has been authorized for detection and/or diagnosis of SARS-CoV-2 by FDA under an Emergency Use Authorization (EUA). This EUA will remain  in effect (meaning this test can be used) for the duration of the COVID-19  declaration under Section 564(b)(1) of the Act, 21 U.S.C.section 360bbb-3(b)(1), unless the authorization is terminated  or revoked sooner.       Influenza A by PCR NEGATIVE NEGATIVE Final   Influenza B by PCR NEGATIVE NEGATIVE Final    Comment: (NOTE) The Xpert Xpress SARS-CoV-2/FLU/RSV plus assay is intended as an aid in the diagnosis of influenza from Nasopharyngeal swab specimens and should not be used as a sole basis for treatment. Nasal washings and aspirates are unacceptable for Xpert Xpress SARS-CoV-2/FLU/RSV testing.  Fact Sheet for Patients: EntrepreneurPulse.com.au  Fact Sheet for Healthcare Providers: IncredibleEmployment.be  This test is not yet approved or cleared by the [place] FDA and has been authorized for detection and/or diagnosis of SARS-CoV-2 by FDA under an Emergency Use Authorization (EUA). This EUA will remain in effect (meaning this test can be used) for the duration of the COVID-19 declaration under Section 564(b)(1) of the Act, 21 U.S.C. section 360bbb-3(b)(1), unless the authorization is terminated or revoked.  Performed at [facility], [address]  [place], [place] [address]      Time coordinating discharge: 25 minutes  SIGNED: Antonieta Pert, MD  [facility] 04/07/2021, 1:05 PM  If 7PM-7AM, please contact night-coverage [email or web address]

## 2021-04-07 NOTE — Plan of Care (Signed)

## 2021-04-07 NOTE — Plan of Care (Signed)
°  Problem: Education: Goal: Knowledge of General Education information will improve Description: Including pain rating scale, medication(s)/side effects and non-pharmacologic comfort measures 04/07/2021 1344 by Terrilee Dudzik, Camillo Flaming, RN Outcome: Adequate for Discharge 04/07/2021 0837 by Daisy Blossom, RN Outcome: Progressing   Problem: Health Behavior/Discharge Planning: Goal: Ability to manage health-related needs will improve 04/07/2021 1344 by Dorri Ozturk, Camillo Flaming, RN Outcome: Adequate for Discharge 04/07/2021 0837 by Daisy Blossom, RN Outcome: Progressing   Problem: Clinical Measurements: Goal: Ability to maintain clinical measurements within normal limits will improve 04/07/2021 1344 by Altagracia Rone, Camillo Flaming, RN Outcome: Adequate for Discharge 04/07/2021 0837 by Daisy Blossom, RN Outcome: Progressing Goal: Will remain free from infection 04/07/2021 1344 by Deniece Rankin, Camillo Flaming, RN Outcome: Adequate for Discharge 04/07/2021 0837 by Daisy Blossom, RN Outcome: Progressing Goal: Diagnostic test results will improve 04/07/2021 1344 by Jakobi Thetford, Camillo Flaming, RN Outcome: Adequate for Discharge 04/07/2021 0837 by Daisy Blossom, RN Outcome: Progressing Goal: Respiratory complications will improve 04/07/2021 1344 by Annalei Friesz, Camillo Flaming, RN Outcome: Adequate for Discharge 04/07/2021 0837 by Daisy Blossom, RN Outcome: Progressing Goal: Cardiovascular complication will be avoided 04/07/2021 1344 by Uzma Hellmer, Camillo Flaming, RN Outcome: Adequate for Discharge 04/07/2021 0837 by Daisy Blossom, RN Outcome: Progressing   Problem: Activity: Goal: Risk for activity intolerance will decrease 04/07/2021 1344 by Nello Corro, Camillo Flaming, RN Outcome: Adequate for Discharge 04/07/2021 0837 by Daisy Blossom, RN Outcome: Progressing   Problem: Nutrition: Goal: Adequate nutrition will be maintained 04/07/2021 1344 by Anyla Israelson, Camillo Flaming, RN Outcome: Adequate for Discharge 04/07/2021 0837 by  Daisy Blossom, RN Outcome: Progressing   Problem: Coping: Goal: Level of anxiety will decrease 04/07/2021 1344 by Astraea Gaughran, Camillo Flaming, RN Outcome: Adequate for Discharge 04/07/2021 0837 by Daisy Blossom, RN Outcome: Progressing   Problem: Elimination: Goal: Will not experience complications related to bowel motility 04/07/2021 1344 by Aahil Fredin, Camillo Flaming, RN Outcome: Adequate for Discharge 04/07/2021 0837 by Daisy Blossom, RN Outcome: Progressing Goal: Will not experience complications related to urinary retention 04/07/2021 1344 by Cristabel Bicknell, Camillo Flaming, RN Outcome: Adequate for Discharge 04/07/2021 0837 by Daisy Blossom, RN Outcome: Progressing   Problem: Pain Managment: Goal: General experience of comfort will improve 04/07/2021 1344 by Teddi Badalamenti, Camillo Flaming, RN Outcome: Adequate for Discharge 04/07/2021 0837 by Daisy Blossom, RN Outcome: Progressing   Problem: Safety: Goal: Ability to remain free from injury will improve 04/07/2021 1344 by Jaymond Waage, Camillo Flaming, RN Outcome: Adequate for Discharge 04/07/2021 0837 by Daisy Blossom, RN Outcome: Progressing   Problem: Skin Integrity: Goal: Risk for impaired skin integrity will decrease 04/07/2021 1344 by Cameron Katayama, Camillo Flaming, RN Outcome: Adequate for Discharge 04/07/2021 0837 by Daisy Blossom, RN Outcome: Progressing

## 2021-04-07 NOTE — Clinical Social Work Note (Signed)
°  Transition of Care [facility]) Screening Note   Patient Details  Name: Gemini Beaumier Clavel Date of Birth: 1965/12/16   Transition of Care [facility]) CM/SW Contact:    Eileen Stanford, LCSW Phone [place] 04/07/2021, 2:56 PM    Transition of Care Department [facility]) has reviewed patient and no TOC needs have been identified at this time. We will continue to monitor patient advancement through interdisciplinary progression rounds. If new patient transition needs arise, please place a TOC consult.

## 2021-04-10 ENCOUNTER — Other Ambulatory Visit: Payer: Self-pay

## 2021-04-10 ENCOUNTER — Other Ambulatory Visit: Payer: Self-pay | Admitting: *Deleted

## 2021-04-10 ENCOUNTER — Other Ambulatory Visit
Admit: 2021-04-10 | Discharge: 2021-04-10 | Disposition: A | Discharge status: Home / Self Care | Payer: 59 | Attending: Surgery | Admitting: Surgery

## 2021-04-10 ENCOUNTER — Encounter: Payer: 59 | Admitting: Surgery

## 2021-04-10 HISTORY — DX: Pneumonia, unspecified organism: J18.9

## 2021-04-10 HISTORY — DX: Anemia, unspecified: D64.9

## 2021-04-10 HISTORY — DX: Personal history of urinary calculi: Z87.442

## 2021-04-10 HISTORY — DX: Sleep apnea, unspecified: G47.30

## 2021-04-10 HISTORY — DX: Malignant (primary) neoplasm, unspecified: C80.1

## 2021-04-10 NOTE — Patient Outreach (Signed)
[facility]) Care Management Geriatric Nurse Practitioner Note   04/10/2021 Name:  Tracy Gardner Pe MRN:  [id number] DOB:  [birth year]  Summary: READMITTED FOR COMPLICATIONS OF SURGERY: SMALL BOWEL OBSTRUCTION AND RESOLVED. Today pt is doing very well, no abd discomfort. Able to eat well. Ileostomy improved output.  Recommendations/Changes made from today's visit: Keep appts, call for problems.  Subjective: Tracy Gardner is an 56 y.o. year old male who is a primary patient of [name], PA-C. The care management team was consulted for assistance with care management and/or care coordination needs.    Geriatric Nurse Practitioner completed Telephone Visit today.   Objective:  Medications Reviewed Today     Reviewed by Dallie Piles, [facility] (Pharmacist) on 04/05/21 at [place] List Status: Complete   Medication Order Taking? Sig Documenting Provider Last Dose Status Informant  albuterol (VENTOLIN HFA) 108 (90 Base) MCG/ACT inhaler [id number]  Inhale 2 puffs into the lungs every 6 (six) hours as needed for wheezing or shortness of breath. [provider]  Active Spouse/Significant Other  amiodarone (PACERONE) 200 MG tablet [id number]  Take 1 tablet (200 mg total) by mouth daily. Hold for HR less then 50 Gollan, Kathlene November, MD  Active Spouse/Significant Other  apixaban (ELIQUIS) 5 MG TABS tablet [id number]  Take 5 mg by mouth 2 (two) times daily. [provider]  Active Pharmacy Records           Med Note Briant Cedar, [facility]   Tue Apr 04, 2021 10:26 PM) Last filled 12/16/20 90 day supply  bisoprolol (ZEBETA) 5 MG tablet [id number] Yes Take 0.5 tablets (2.5 mg total) by mouth daily.  Patient taking differently: Take 2.5 mg by mouth daily. Wife has been holding d/t hypotension   Gollan, Kathlene November, MD Past Month Active Spouse/Significant Other  cetirizine (ZYRTEC) 10 MG tablet [id number]  Take 10 mg by mouth daily. [provider]  Active  Spouse/Significant Other  doxylamine, Sleep, (UNISOM) 25 MG tablet [id number]  Take 25 mg by mouth at bedtime. [provider]  Active Spouse/Significant Other  empagliflozin (JARDIANCE) 10 MG TABS tablet [id number]  Take 1 tablet (10 mg total) by mouth daily.   Active Spouse/Significant Other           Med Note Sharene Butters   Tue Apr 04, 2021 10:07 AM) On hold per physician instruction due to low blood sugars   fluticasone (FLOVENT HFA) 110 MCG/ACT inhaler [id number]  Inhale into the lungs 2 (two) times daily.  Patient taking differently: Inhale 1 puff into the lungs 2 (two) times daily as needed (asthma).     Active Spouse/Significant Other  furosemide (LASIX) 20 MG tablet [id number]  Take 10 mg by mouth daily. [provider]  Active Spouse/Significant Other           Med Note Kenton Kingfisher, Earley Favor   Tue Apr 04, 2021 10:06 AM) On hold per physician instruction due to low blood pressure  ibuprofen (ADVIL) 600 MG tablet [id number]  Take 1 tablet (600 mg total) by mouth every 6 (six) hours as needed. Tylene Fantasia, PA-C  Active Spouse/Significant Other  Multiple Vitamins-Minerals (MULTIVITAMIN WITH MINERALS) tablet [id number]  Take 1 tablet by mouth daily. [provider]  Active Spouse/Significant Other  omeprazole (PRILOSEC) 20 MG capsule [id number]  Take 1 capsule (20 mg total) by mouth 2 (two) times daily for 14 days.  Patient taking differently: Take 20 mg by mouth in the morning.  Virgel Manifold, MD  Expired 04/04/21 2359 Spouse/Significant Other           Med Note Kenton Kingfisher, YOLINDA T   Tue Apr 04, 2021 10:08 AM)    ondansetron (ZOFRAN) 4 MG tablet [id number]  Take 1 tablet (4 mg total) by mouth every 8 (eight) hours as needed for nausea or vomiting. Jules Husbands, MD  Active   oxyCODONE (OXY IR/ROXICODONE) 5 MG immediate release tablet [id number]  Take 1-2 tablets (5-10 mg total) by mouth every 4 (four) hours as needed for moderate pain. Tylene Fantasia,  PA-C  Active Spouse/Significant Other  rosuvastatin (CRESTOR) 10 MG tablet [id number]  Take 1 tablet (10 mg total) by mouth daily.  Patient taking differently: Take 10 mg by mouth every evening.   Minna Merritts, MD  Active Spouse/Significant Other             SDOH:  (Social Determinants of Health) assessments and interventions performed:    Care Plan  Review of patient past medical history, allergies, medications, health status, including review of consultants reports, laboratory and other test data, was performed as part of comprehensive evaluation for care management services.   Care Plan : [place] Management Plan of Care  Updates made by Deloria Lair, NP since 04/10/2021 12:00 AM     Problem: Adenocarcinoma of colon   Priority: High  Onset Date: 03/29/2021     Goal: Patient will follow post operative instructions and attend all post op appts over the next 30 days.   Start Date: 03/30/2021  Expected End Date: 05/29/2021  This Visit's Progress: On track  Priority: High  Note:    Update 04/10/21:  (Status: Goal on Track (progressing): YES.) Short Term Goal  Evaluation of current treatment plan related to cancer treatment and patient's adherence to plan as established by provider Advised patient to call MD, or NP for assistance if any problems arise. Reviewed scheduled/upcoming provider appointments including Discussed plans with patient for ongoing care management follow up and provided patient with direct contact information for care management team Support [name] in her efforts to get her FMLA to care for her husband. We discussed this today. Documents in support of this need to be signed by oncologist this week.  Current Barriers:  Knowledge Deficits related to plan of care for management of cancer.   RNCM Clinical Goal(s):  Patient will verbalize basic understanding of cancer disease process and self health management plan as evidenced by telephone  conversations. take all medications exactly as prescribed and will call provider for medication related questions as evidenced by pt report.    attend all scheduled medical appointments: surgical and oncology as evidenced by pt report and chart review.        work with [place] Team to appropriately treat cancer as evidenced by pt report and chart review  through collaboration with RN Care manager, provider, and care team.   Interventions: Inter-disciplinary care team collaboration (see longitudinal plan of care) Evaluation of current treatment plan related to  self management and patient's adherence to plan as established by provider  Patient Goals/Self-Care Activities: Attend all scheduled provider appointments Call provider office for new concerns or questions  Participate in [place] Management calls        Plan: Telephone follow up appointment with care management team member scheduled for:  Monday, February 6th.  Eulah Pont. Myrtie Neither, MSN, [facility] Gerontological Nurse Practitioner [facility] Care Management [phone]

## 2021-04-10 NOTE — Patient Instructions (Addendum)
Your procedure is scheduled on: 04/13/21 - Thursday Report to the Registration Desk on the 1st floor of the [facility]. To find out your arrival time, please call [phone] between 1PM - 3PM on: 04/12/21 - Wednesday  REMEMBER: Instructions that are not followed completely may result in serious medical risk, up to and including death; or upon the discretion of your surgeon and anesthesiologist your surgery may need to be rescheduled.  Do not eat food or drink any fluids after midnight the night before surgery.  No gum chewing, lozengers or hard candies.  TAKE THESE MEDICATIONS THE MORNING OF SURGERY WITH A SIP OF WATER:  - amiodarone (PACERONE) 200 MG tablet  Use inhaler on the day of surgery and bring to the hospital.  Follow recommendations from Cardiologist, Pulmonologist or PCP regarding stopping Aspirin, Coumadin, Plavix, Eliquis, Pradaxa, or Pletal.  - empagliflozin (JARDIANCE) 10 MG TABS tablet- Do Not Take on 01/30,  01/31, 02/01, and do not take the day of surgery.  One week prior to surgery: Stop Anti-inflammatories (NSAIDS) such as Advil, Aleve, Ibuprofen, Motrin, Naproxen, Naprosyn and Aspirin based products such as Excedrin, Goodys Powder, BC Powder.  Stop ANY OVER THE COUNTER supplements until after surgery.  You may however, continue to take Tylenol if needed for pain up until the day of surgery.  No Alcohol for 24 hours before or after surgery.  No Smoking including e-cigarettes for 24 hours prior to surgery.  No chewable tobacco products for at least 6 hours prior to surgery.  No nicotine patches on the day of surgery.  Do not use any "recreational" drugs for at least a week prior to your surgery.  Please be advised that the combination of cocaine and anesthesia may have negative outcomes, up to and including death. If you test positive for cocaine, your surgery will be cancelled.  On the morning of surgery brush your teeth with toothpaste and water, you  may rinse your mouth with mouthwash if you wish. Do not swallow any toothpaste or mouthwash.  Do not wear jewelry, make-up, hairpins, clips or nail polish.  Do not wear lotions, powders, or perfumes.   Do not shave body from the neck down 48 hours prior to surgery just in case you cut yourself which could leave a site for infection.  Also, freshly shaved skin may become irritated if using the CHG soap.  Contact lenses, hearing aids and dentures may not be worn into surgery.  Do not bring valuables to the hospital. [facility] is not responsible for any missing/lost belongings or valuables.   Bring your C-PAP to the hospital with you in case you may have to spend the night.   Notify your doctor if there is any change in your medical condition (cold, fever, infection).  Wear comfortable clothing (specific to your surgery type) to the hospital.  After surgery, you can help prevent lung complications by doing breathing exercises.  Take deep breaths and cough every 1-2 hours. Your doctor may order a device called an Incentive Spirometer to help you take deep breaths. When coughing or sneezing, hold a pillow firmly against your incision with both hands. This is called splinting. Doing this helps protect your incision. It also decreases belly discomfort.  If you are being admitted to the hospital overnight, leave your suitcase in the car. After surgery it may be brought to your room.  If you are being discharged the day of surgery, you will not be allowed to drive home. You will  need a responsible adult (18 years or older) to drive you home and stay with you that night.   If you are taking public transportation, you will need to have a responsible adult (18 years or older) with you. Please confirm with your physician that it is acceptable to use public transportation.   Please call the [place] Dept. at [phone] if you have any questions about these  instructions.  Surgery Visitation Policy:  Patients undergoing a surgery or procedure may have one family member or support person with them as long as that person is not COVID-19 positive or experiencing its symptoms.  That person may remain in the waiting area during the procedure and may rotate out with other people.  Inpatient Visitation:    Visiting hours are 7 a.m. to 8 p.m. Up to two visitors ages 16+ are allowed at one time in a patient room. The visitors may rotate out with other people during the day. Visitors must check out when they leave, or other visitors will not be allowed. One designated support person may remain overnight. The visitor must pass COVID-19 screenings, use hand sanitizer when entering and exiting the patients room and wear a mask at all times, including in the patients room. Patients must also wear a mask when staff or their visitor are in the room. Masking is required regardless of vaccination status.

## 2021-04-10 NOTE — Addendum Note (Signed)
Addended by: Caroleen Hamman F on: 04/10/2021 12:13 PM   Modules accepted: Orders

## 2021-04-11 ENCOUNTER — Other Ambulatory Visit: Payer: 59

## 2021-04-11 DIAGNOSIS — G4733 Obstructive sleep apnea (adult) (pediatric): Secondary | ICD-10-CM | POA: Diagnosis not present

## 2021-04-12 ENCOUNTER — Inpatient Hospital Stay: Payer: 59 | Attending: Internal Medicine

## 2021-04-12 ENCOUNTER — Inpatient Hospital Stay (HOSPITAL_BASED_OUTPATIENT_CLINIC_OR_DEPARTMENT_OTHER): Payer: 59 | Admitting: Internal Medicine

## 2021-04-12 ENCOUNTER — Encounter: Payer: Self-pay | Admitting: Internal Medicine

## 2021-04-12 ENCOUNTER — Other Ambulatory Visit: Payer: Self-pay

## 2021-04-12 DIAGNOSIS — Z7901 Long term (current) use of anticoagulants: Secondary | ICD-10-CM | POA: Insufficient documentation

## 2021-04-12 DIAGNOSIS — R5383 Other fatigue: Secondary | ICD-10-CM | POA: Insufficient documentation

## 2021-04-12 DIAGNOSIS — M47816 Spondylosis without myelopathy or radiculopathy, lumbar region: Secondary | ICD-10-CM | POA: Insufficient documentation

## 2021-04-12 DIAGNOSIS — E1122 Type 2 diabetes mellitus with diabetic chronic kidney disease: Secondary | ICD-10-CM | POA: Diagnosis not present

## 2021-04-12 DIAGNOSIS — Z87442 Personal history of urinary calculi: Secondary | ICD-10-CM | POA: Insufficient documentation

## 2021-04-12 DIAGNOSIS — Z933 Colostomy status: Secondary | ICD-10-CM | POA: Insufficient documentation

## 2021-04-12 DIAGNOSIS — E1165 Type 2 diabetes mellitus with hyperglycemia: Secondary | ICD-10-CM | POA: Diagnosis not present

## 2021-04-12 DIAGNOSIS — R809 Proteinuria, unspecified: Secondary | ICD-10-CM | POA: Diagnosis not present

## 2021-04-12 DIAGNOSIS — Z905 Acquired absence of kidney: Secondary | ICD-10-CM | POA: Insufficient documentation

## 2021-04-12 DIAGNOSIS — R197 Diarrhea, unspecified: Secondary | ICD-10-CM | POA: Diagnosis not present

## 2021-04-12 DIAGNOSIS — C189 Malignant neoplasm of colon, unspecified: Secondary | ICD-10-CM

## 2021-04-12 DIAGNOSIS — R200 Anesthesia of skin: Secondary | ICD-10-CM | POA: Diagnosis not present

## 2021-04-12 DIAGNOSIS — I429 Cardiomyopathy, unspecified: Secondary | ICD-10-CM | POA: Diagnosis not present

## 2021-04-12 DIAGNOSIS — K56609 Unspecified intestinal obstruction, unspecified as to partial versus complete obstruction: Secondary | ICD-10-CM | POA: Diagnosis not present

## 2021-04-12 DIAGNOSIS — D751 Secondary polycythemia: Secondary | ICD-10-CM | POA: Diagnosis not present

## 2021-04-12 DIAGNOSIS — K9411 Enterostomy hemorrhage: Secondary | ICD-10-CM | POA: Diagnosis not present

## 2021-04-12 DIAGNOSIS — I700 Atherosclerosis of aorta: Secondary | ICD-10-CM | POA: Diagnosis not present

## 2021-04-12 DIAGNOSIS — I129 Hypertensive chronic kidney disease with stage 1 through stage 4 chronic kidney disease, or unspecified chronic kidney disease: Secondary | ICD-10-CM | POA: Insufficient documentation

## 2021-04-12 DIAGNOSIS — N179 Acute kidney failure, unspecified: Secondary | ICD-10-CM | POA: Insufficient documentation

## 2021-04-12 DIAGNOSIS — Z5111 Encounter for antineoplastic chemotherapy: Secondary | ICD-10-CM | POA: Diagnosis not present

## 2021-04-12 DIAGNOSIS — N1831 Chronic kidney disease, stage 3a: Secondary | ICD-10-CM | POA: Insufficient documentation

## 2021-04-12 DIAGNOSIS — Z833 Family history of diabetes mellitus: Secondary | ICD-10-CM | POA: Insufficient documentation

## 2021-04-12 DIAGNOSIS — D649 Anemia, unspecified: Secondary | ICD-10-CM | POA: Diagnosis not present

## 2021-04-12 DIAGNOSIS — I4891 Unspecified atrial fibrillation: Secondary | ICD-10-CM | POA: Insufficient documentation

## 2021-04-12 DIAGNOSIS — Z87891 Personal history of nicotine dependence: Secondary | ICD-10-CM | POA: Insufficient documentation

## 2021-04-12 DIAGNOSIS — Z8249 Family history of ischemic heart disease and other diseases of the circulatory system: Secondary | ICD-10-CM | POA: Insufficient documentation

## 2021-04-12 DIAGNOSIS — Z79899 Other long term (current) drug therapy: Secondary | ICD-10-CM | POA: Insufficient documentation

## 2021-04-12 DIAGNOSIS — R202 Paresthesia of skin: Secondary | ICD-10-CM | POA: Diagnosis not present

## 2021-04-12 DIAGNOSIS — C19 Malignant neoplasm of rectosigmoid junction: Secondary | ICD-10-CM | POA: Insufficient documentation

## 2021-04-12 DIAGNOSIS — M47814 Spondylosis without myelopathy or radiculopathy, thoracic region: Secondary | ICD-10-CM | POA: Insufficient documentation

## 2021-04-12 DIAGNOSIS — E860 Dehydration: Secondary | ICD-10-CM | POA: Diagnosis not present

## 2021-04-12 DIAGNOSIS — C187 Malignant neoplasm of sigmoid colon: Secondary | ICD-10-CM

## 2021-04-12 DIAGNOSIS — G4730 Sleep apnea, unspecified: Secondary | ICD-10-CM | POA: Insufficient documentation

## 2021-04-12 LAB — COMPREHENSIVE METABOLIC PANEL
ALT: 32 U/L (ref 0–44)
AST: 33 U/L (ref 15–41)
Albumin: 3.9 g/dL (ref 3.5–5.0)
Alkaline Phosphatase: 66 U/L (ref 38–126)
Anion gap: 10 (ref 5–15)
BUN: 16 mg/dL (ref 6–20)
CO2: 18 mmol/L — ABNORMAL LOW (ref 22–32)
Calcium: 9 mg/dL (ref 8.9–10.3)
Chloride: 106 mmol/L (ref 98–111)
Creatinine, Ser: 1.35 mg/dL — ABNORMAL HIGH (ref 0.61–1.24)
GFR, Estimated: >60 mL/min (ref >60)
Glucose, Bld: 131 mg/dL — ABNORMAL HIGH (ref 70–99)
Potassium: 4.6 mmol/L (ref 3.5–5.1)
Sodium: 134 mmol/L — ABNORMAL LOW (ref 135–145)
Total Bilirubin: 0.3 mg/dL (ref 0.3–1.2)
Total Protein: 7.9 g/dL (ref 6.5–8.1)

## 2021-04-12 LAB — CBC WITH DIFFERENTIAL/PLATELET
Abs Immature Granulocytes: 0.06 10*3/uL (ref 0.00–0.07)
Basophils Absolute: 0.1 10*3/uL (ref 0.0–0.1)
Basophils Relative: 1 %
Eosinophils Absolute: 0.3 10*3/uL (ref 0.0–0.5)
Eosinophils Relative: 5 %
HCT: 38.6 % — ABNORMAL LOW (ref 39.0–52.0)
Hemoglobin: 11.8 g/dL — ABNORMAL LOW (ref 13.0–17.0)
Immature Granulocytes: 1 %
Lymphocytes Relative: 19 %
Lymphs Abs: 1 10*3/uL (ref 0.7–4.0)
MCH: 24 pg — ABNORMAL LOW (ref 26.0–34.0)
MCHC: 30.6 g/dL (ref 30.0–36.0)
MCV: 78.6 fL — ABNORMAL LOW (ref 80.0–100.0)
Monocytes Absolute: 0.6 10*3/uL (ref 0.1–1.0)
Monocytes Relative: 10 %
Neutro Abs: 3.6 10*3/uL (ref 1.7–7.7)
Neutrophils Relative %: 64 %
Platelets: 210 10*3/uL (ref 150–400)
RBC: 4.91 MIL/uL (ref 4.22–5.81)
RDW: 23 % — ABNORMAL HIGH (ref 11.5–15.5)
WBC: 5.6 10*3/uL (ref 4.0–10.5)
nRBC: 0 % (ref 0.0–0.2)
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
## 2021-04-12 NOTE — Assessment & Plan Note (Addendum)
#   Stage III colon cancer T3N2-rectosigmoid; distal 5 mm margin; MSS.  Will discuss at tumor conference on 2/9.  # I discussed the role of adjuvant chemotherapy in cutting down the risk of recurrent malignancy. I discussed that FOLFOX chemotherapy is given every 2 weeks; discuss the potential side effects including but not limited to nausea vomiting diarrhea, sores in the mouth, hand-foot syndrome; also tingling and numbness/cold sensitivity with oxaliplatin.   # Solitary kidney- GFR- creat- 1.3 [GFR >60]; recent acute renal failure [creatinine-5.3]-prerenal status post IV hydration; currently back to baseline.  Discussed importance of hydration/close monitoring current chemotherapy; and the risk of dialysis based on his solitary kidney.  #Loose stool in the colostomy bag-s/p chronic surgery -discussed importance of dietary compliance/will refer to [employer].  # IV access: awaiting port on 2/2.   # Hx of Afib-eliquis/amiodarone-monitor closely for bleeding tendencies on chemotherapy.  # FMLA for wife filled out/discussed with Heather.  # DISPOSITION:  # [place]oli re: diarrhea s/p colsotomy- dietary recommendations # chemo education- FOLFOX next week # follow up in 2 weeks- MD; labs-cbc/cmp; FOLFOX [new]- Dr.B  # 40 minutes face-to-face with the patient discussing the above plan of care; more than 50% of time spent on prognosis/ natural history; counseling and coordination.

## 2021-04-12 NOTE — Progress Notes (Signed)
Refill of the [place] cream.

## 2021-04-12 NOTE — Telephone Encounter (Signed)
Fmla faxed to [employer]. Wife given a copy of the fmla forms. She will contact Marcie Bal with [employer] to initiate an intermittent leave claim. She previously only applied for continuous leave. She stated that she felt that "I need to give my boss a copy of this FMLA form." I explained to her that she is not required by law to give her employer a copy of the fmla. I reminded her that [facility] does not need to know her husband diagnosis. However, if she needs a letter written with her days off, I can do this for her. I again encouraged her to reach out to [employer] to let them know to initiate a claim for intermittent leave. She thanked me for this information.  The patient asked if he can have a Education officer, museum consult to discuss applying for disability and his financial distress. I will initiate a consult to Te[place]ita. He is self employee. Wife works as a Marine scientist in the hospital within [employer]. Given his current health issues, he is unable to work. He stated that he has about $50,000 of hospital bills at the moment and he doesn't want any barriers to his care from a financial standpoint. I briefly mentioned that we can help navigate his financial concerns. There are potential options available with the hospital foundation and other resources may be available as well. He would like to meet with the social worker first and then go from there.  Social worker referral entered.

## 2021-04-12 NOTE — Addendum Note (Signed)
Addended by: Gloris Ham on: 04/12/2021 04:54 PM   Modules accepted: Orders

## 2021-04-12 NOTE — Progress Notes (Signed)
START ON PATHWAY REGIMEN - Colorectal     A cycle is every 14 days:     Oxaliplatin      Leucovorin      Fluorouracil      Fluorouracil   **Always confirm dose/schedule in your pharmacy ordering system**  Patient Characteristics: Postoperative without Neoadjuvant Therapy (Pathologic Staging), Colon, Stage III, Low Risk (pT1-3, pN1) Tumor Location: Colon Therapeutic Status: Postoperative without Neoadjuvant Therapy (Pathologic Staging) AJCC M Category: cM0 AJCC T Category: pT3 AJCC N Category: pN1b AJCC 8 Stage Grouping: IIIB Intent of Therapy: Curative Intent, Discussed with Patient
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
## 2021-04-13 ENCOUNTER — Ambulatory Visit: Payer: 59 | Admitting: Urgent Care

## 2021-04-13 ENCOUNTER — Ambulatory Visit: Payer: 59

## 2021-04-13 ENCOUNTER — Other Ambulatory Visit: Payer: Self-pay

## 2021-04-13 ENCOUNTER — Ambulatory Visit
Admission: RE | Admit: 2021-04-13 | Discharge: 2021-04-13 | Disposition: A | Discharge status: Home / Self Care | Payer: 59 | Attending: Surgery | Admitting: Surgery

## 2021-04-13 ENCOUNTER — Encounter: Payer: Self-pay | Admitting: Surgery

## 2021-04-13 ENCOUNTER — Encounter
Admission: RE | Disposition: A | Discharge status: Home / Self Care | Payer: Self-pay | Source: Home / Self Care | Attending: Surgery

## 2021-04-13 DIAGNOSIS — C187 Malignant neoplasm of sigmoid colon: Secondary | ICD-10-CM | POA: Insufficient documentation

## 2021-04-13 DIAGNOSIS — Z905 Acquired absence of kidney: Secondary | ICD-10-CM | POA: Insufficient documentation

## 2021-04-13 DIAGNOSIS — E669 Obesity, unspecified: Secondary | ICD-10-CM | POA: Diagnosis not present

## 2021-04-13 DIAGNOSIS — Z01818 Encounter for other preprocedural examination: Secondary | ICD-10-CM | POA: Diagnosis present

## 2021-04-13 DIAGNOSIS — N183 Chronic kidney disease, stage 3 unspecified: Secondary | ICD-10-CM | POA: Diagnosis not present

## 2021-04-13 DIAGNOSIS — I129 Hypertensive chronic kidney disease with stage 1 through stage 4 chronic kidney disease, or unspecified chronic kidney disease: Secondary | ICD-10-CM | POA: Insufficient documentation

## 2021-04-13 DIAGNOSIS — C189 Malignant neoplasm of colon, unspecified: Secondary | ICD-10-CM | POA: Diagnosis not present

## 2021-04-13 DIAGNOSIS — I429 Cardiomyopathy, unspecified: Secondary | ICD-10-CM | POA: Diagnosis not present

## 2021-04-13 DIAGNOSIS — E1122 Type 2 diabetes mellitus with diabetic chronic kidney disease: Secondary | ICD-10-CM | POA: Diagnosis not present

## 2021-04-13 DIAGNOSIS — Z87891 Personal history of nicotine dependence: Secondary | ICD-10-CM | POA: Diagnosis not present

## 2021-04-13 DIAGNOSIS — Z452 Encounter for adjustment and management of vascular access device: Secondary | ICD-10-CM | POA: Diagnosis not present

## 2021-04-13 DIAGNOSIS — G4733 Obstructive sleep apnea (adult) (pediatric): Secondary | ICD-10-CM | POA: Diagnosis not present

## 2021-04-13 DIAGNOSIS — I480 Paroxysmal atrial fibrillation: Secondary | ICD-10-CM | POA: Insufficient documentation

## 2021-04-13 DIAGNOSIS — Z95828 Presence of other vascular implants and grafts: Secondary | ICD-10-CM

## 2021-04-13 DIAGNOSIS — Z95810 Presence of automatic (implantable) cardiac defibrillator: Secondary | ICD-10-CM | POA: Diagnosis not present

## 2021-04-13 HISTORY — PX: PORTACATH PLACEMENT: SHX2246

## 2021-04-13 LAB — CEA: CEA: 0.7 ng/mL (ref 0.0–4.7)

## 2021-04-13 LAB — GLUCOSE, CAPILLARY
Glucose-Capillary: 99 mg/dL (ref 70–99)
Glucose-Capillary: 120 mg/dL — ABNORMAL HIGH (ref 70–99)

## 2021-04-13 SURGERY — INSERTION, TUNNELED CENTRAL VENOUS DEVICE, WITH PORT
Anesthesia: General

## 2021-04-13 MED ORDER — PROPOFOL 500 MG/50ML IV EMUL
INTRAVENOUS | Status: AC
  Filled 2021-04-13: qty 50

## 2021-04-13 MED ORDER — CEFAZOLIN SODIUM-DEXTROSE 2-4 GM/100ML-% IV SOLN
INTRAVENOUS | Status: AC
  Filled 2021-04-13: qty 100

## 2021-04-13 MED ORDER — GABAPENTIN 300 MG PO CAPS
ORAL_CAPSULE | ORAL | Status: AC
  Administered 2021-04-13: 300 mg via ORAL
  Filled 2021-04-13: qty 1

## 2021-04-13 MED ORDER — LIDOCAINE HCL (PF) 1 % IJ SOLN
INTRAMUSCULAR | Status: AC
  Filled 2021-04-13: qty 30

## 2021-04-13 MED ORDER — GABAPENTIN 300 MG PO CAPS: 300.0000 mg | ORAL_CAPSULE | ORAL | Status: AC

## 2021-04-13 MED ORDER — PHENYLEPHRINE HCL (PRESSORS) 10 MG/ML IV SOLN
INTRAVENOUS | Status: AC
  Filled 2021-04-13: qty 1

## 2021-04-13 MED ORDER — FENTANYL CITRATE (PF) 100 MCG/2ML IJ SOLN
INTRAMUSCULAR | Status: DC | PRN
  Administered 2021-04-13 (×2): 50 ug via INTRAVENOUS

## 2021-04-13 MED ORDER — CEFAZOLIN SODIUM-DEXTROSE 2-4 GM/100ML-% IV SOLN
2.0000 g | INTRAVENOUS | Status: AC
  Administered 2021-04-13: 2 g via INTRAVENOUS

## 2021-04-13 MED ORDER — SODIUM CHLORIDE FLUSH 0.9 % IV SOLN
INTRAVENOUS | Status: AC
  Filled 2021-04-13: qty 10

## 2021-04-13 MED ORDER — EPHEDRINE SULFATE (PRESSORS) 50 MG/ML IJ SOLN
INTRAMUSCULAR | Status: DC | PRN
  Administered 2021-04-13 (×3): 5 mg via INTRAVENOUS
  Administered 2021-04-13: 10 mg via INTRAVENOUS

## 2021-04-13 MED ORDER — SODIUM CHLORIDE (PF) 0.9 % IJ SOLN
INTRAMUSCULAR | Status: DC | PRN
  Administered 2021-04-13: 10 mL via INTRAVENOUS

## 2021-04-13 MED ORDER — ORAL CARE MOUTH RINSE: 15.0000 mL | Freq: Once | OROMUCOSAL | Status: AC

## 2021-04-13 MED ORDER — SODIUM CHLORIDE 0.9 % IV SOLN: INTRAVENOUS | Status: DC

## 2021-04-13 MED ORDER — LIDOCAINE HCL (CARDIAC) PF 100 MG/5ML IV SOSY
PREFILLED_SYRINGE | INTRAVENOUS | Status: DC | PRN
  Administered 2021-04-13: 60 mg via INTRAVENOUS

## 2021-04-13 MED ORDER — HYDROCODONE-ACETAMINOPHEN 5-325 MG PO TABS
ORAL_TABLET | ORAL | 0 refills | Status: DC
  Filled 2021-04-13: qty 12, 3d supply, fill #0

## 2021-04-13 MED ORDER — LIDOCAINE HCL 1 % IJ SOLN
INTRAMUSCULAR | Status: DC | PRN
  Administered 2021-04-13: 20 mL

## 2021-04-13 MED ORDER — CHLORHEXIDINE GLUCONATE 0.12 % MT SOLN
OROMUCOSAL | Status: AC
  Administered 2021-04-13: 15 mL via OROMUCOSAL
  Filled 2021-04-13: qty 15

## 2021-04-13 MED ORDER — ALBUTEROL SULFATE HFA 108 (90 BASE) MCG/ACT IN AERS
INHALATION_SPRAY | RESPIRATORY_TRACT | 11 refills | Status: DC
  Filled 2021-04-13: qty 18, 25d supply, fill #0
  Filled 2021-08-21: qty 18, 25d supply, fill #1
  Filled 2021-09-13: qty 18, 25d supply, fill #2
  Filled 2021-11-01: qty 18, 25d supply, fill #3

## 2021-04-13 MED ORDER — CHLORHEXIDINE GLUCONATE CLOTH 2 % EX PADS: 6.0000 | MEDICATED_PAD | Freq: Once | CUTANEOUS | Status: DC

## 2021-04-13 MED ORDER — DEXAMETHASONE SODIUM PHOSPHATE 10 MG/ML IJ SOLN
INTRAMUSCULAR | Status: DC | PRN
  Administered 2021-04-13: 8 mg via INTRAVENOUS

## 2021-04-13 MED ORDER — GLYCOPYRROLATE 0.2 MG/ML IJ SOLN
INTRAMUSCULAR | Status: DC | PRN
  Administered 2021-04-13: .2 mg via INTRAVENOUS

## 2021-04-13 MED ORDER — HYDROCODONE-ACETAMINOPHEN 5-325 MG PO TABS: 1.0000 | ORAL_TABLET | Freq: Four times a day (QID) | ORAL | 0 refills | Status: DC | PRN

## 2021-04-13 MED ORDER — CHLORHEXIDINE GLUCONATE 0.12 % MT SOLN: 15.0000 mL | Freq: Once | OROMUCOSAL | Status: AC

## 2021-04-13 MED ORDER — HEPARIN SODIUM (PORCINE) 5000 UNIT/ML IJ SOLN
INTRAMUSCULAR | Status: AC
  Filled 2021-04-13: qty 1

## 2021-04-13 MED ORDER — ONDANSETRON HCL 4 MG/2ML IJ SOLN
INTRAMUSCULAR | Status: DC | PRN
  Administered 2021-04-13: 4 mg via INTRAVENOUS

## 2021-04-13 MED ORDER — FENTANYL CITRATE (PF) 100 MCG/2ML IJ SOLN
INTRAMUSCULAR | Status: AC
  Filled 2021-04-13: qty 2

## 2021-04-13 MED ORDER — BUPIVACAINE-EPINEPHRINE (PF) 0.25% -1:200000 IJ SOLN
INTRAMUSCULAR | Status: AC
  Filled 2021-04-13: qty 30

## 2021-04-13 MED ORDER — MIDAZOLAM HCL 2 MG/2ML IJ SOLN
INTRAMUSCULAR | Status: AC
  Filled 2021-04-13: qty 2

## 2021-04-13 MED ORDER — ACETAMINOPHEN 500 MG PO TABS: 1000.0000 mg | ORAL_TABLET | ORAL | Status: AC

## 2021-04-13 MED ORDER — ACETAMINOPHEN 500 MG PO TABS
ORAL_TABLET | ORAL | Status: AC
  Administered 2021-04-13: 1000 mg via ORAL
  Filled 2021-04-13: qty 2

## 2021-04-13 MED ORDER — HEPARIN 5000 UNITS IN NS 1000 ML (FLUSH)
INTRAMUSCULAR | Status: DC | PRN
  Administered 2021-04-13: 5 mL via INTRAMUSCULAR

## 2021-04-13 MED ORDER — PROPOFOL 10 MG/ML IV BOLUS
INTRAVENOUS | Status: DC | PRN
  Administered 2021-04-13: 160 mg via INTRAVENOUS

## 2021-04-13 SURGICAL SUPPLY — 37 items
ADH SKN CLS APL DERMABOND .7 (GAUZE/BANDAGES/DRESSINGS) ×1
APL PRP STRL LF DISP 70% ISPRP (MISCELLANEOUS) ×1
BAG DECANTER FOR FLEXI CONT (MISCELLANEOUS) ×4 IMPLANT
BLADE SURG SZ11 CARB STEEL (BLADE) ×2 IMPLANT
BOOT SUTURE AID YELLOW STND (SUTURE) ×2 IMPLANT
CHLORAPREP W/TINT 26 (MISCELLANEOUS) ×2 IMPLANT
COVER LIGHT HANDLE STERIS (MISCELLANEOUS) ×4 IMPLANT
DERMABOND ADVANCED (GAUZE/BANDAGES/DRESSINGS) ×1
DERMABOND ADVANCED .7 DNX12 (GAUZE/BANDAGES/DRESSINGS) ×1 IMPLANT
DRAPE 3/4 80X56 (DRAPES) ×2 IMPLANT
DRAPE C-ARM XRAY 36X54 (DRAPES) ×4 IMPLANT
DRAPE INCISE IOBAN 66X45 STRL (DRAPES) ×2 IMPLANT
ELECT CAUTERY BLADE 6.4 (BLADE) ×2 IMPLANT
ELECT REM PT RETURN 9FT ADLT (ELECTROSURGICAL) ×2
ELECTRODE REM PT RTRN 9FT ADLT (ELECTROSURGICAL) ×1 IMPLANT
GEL ULTRASOUND 20GR AQUASONIC (MISCELLANEOUS) ×2 IMPLANT
GLOVE SURG ENC MOIS LTX SZ7 (GLOVE) ×2 IMPLANT
GOWN STRL REUS W/ TWL LRG LVL3 (GOWN DISPOSABLE) ×2 IMPLANT
GOWN STRL REUS W/TWL LRG LVL3 (GOWN DISPOSABLE) ×4
IV NS 500ML (IV SOLUTION) ×2
IV NS 500ML BAXH (IV SOLUTION) ×1 IMPLANT
KIT PORT POWER 8FR ISP CVUE (Port) ×2 IMPLANT
MANIFOLD NEPTUNE II (INSTRUMENTS) ×2 IMPLANT
NEEDLE HYPO 22GX1.5 SAFETY (NEEDLE) ×2 IMPLANT
PACK PORT-A-CATH (MISCELLANEOUS) ×2 IMPLANT
SPONGE T-LAP 18X18 ~~LOC~~+RFID (SPONGE) ×2 IMPLANT
SUT MNCRL AB 4-0 PS2 18 (SUTURE) ×2 IMPLANT
SUT PROLENE 2-0 (SUTURE) ×2
SUT PROLENE 2-0 RB1 36X2 ARM (SUTURE) ×1
SUT VIC AB 3-0 SH 27 (SUTURE) ×2
SUT VIC AB 3-0 SH 27X BRD (SUTURE) ×1 IMPLANT
SUTURE PROLEN 2-0 RB1 36X2 ARM (SUTURE) ×1 IMPLANT
SYR 10ML LL (SYRINGE) ×2 IMPLANT
SYR 20ML LL LF (SYRINGE) ×2 IMPLANT
SYR 5ML LL (SYRINGE) ×2 IMPLANT
TOWEL OR 17X26 4PK STRL BLUE (TOWEL DISPOSABLE) ×2 IMPLANT
WATER STERILE IRR 500ML POUR (IV SOLUTION) ×2 IMPLANT

## 2021-04-13 NOTE — Interval H&P Note (Signed)
History and Physical Interval Note:  04/13/2021 7:18 AM  Tracy Gardner  has presented today for surgery, with the diagnosis of adenocarcinoma of colon.  The various methods of treatment have been discussed with the patient and family. After consideration of risks, benefits and other options for treatment, the patient has consented to  Procedure(s) with comments: INSERTION PORT-A-CATH (N/A) - Provider requesting 1 hour / 60 minutes for procedure. as a surgical intervention.  The patient's history has been reviewed, patient examined, no change in status, stable for surgery.  I have reviewed the patient's chart and labs.  Questions were answered to the patient's satisfaction.  Pt feels well and has recovered from ileus. Ready to move forward w port. Risks, benefits and possible complications d/w the pt in detail and he agree to proceed.    [place]

## 2021-04-13 NOTE — Anesthesia Procedure Notes (Signed)
Procedure Name: LMA Insertion Date/Time: 04/13/2021 7:32 AM Performed by: Lerry Liner, CRNA Pre-anesthesia Checklist: Patient identified, Emergency Drugs available, Suction available and Patient being monitored Patient Re-evaluated:Patient Re-evaluated prior to induction Oxygen Delivery Method: Circle system utilized Induction Type: Combination inhalational/ intravenous induction Ventilation: Mask ventilation without difficulty LMA: LMA inserted LMA Size: 4.0 Number of attempts: 1 Tube secured with: Tape Dental Injury: Teeth and Oropharynx as per pre-operative assessment

## 2021-04-13 NOTE — Anesthesia Postprocedure Evaluation (Signed)
Anesthesia Post Note  Patient: Tracy Gardner  Procedure(s) Performed: INSERTION PORT-A-CATH  Patient location during evaluation: PACU Anesthesia Type: General Level of consciousness: awake and alert Pain management: pain level controlled Vital Signs Assessment: post-procedure vital signs reviewed and stable Respiratory status: spontaneous breathing, nonlabored ventilation and respiratory function stable Cardiovascular status: blood pressure returned to baseline and stable Postop Assessment: no apparent nausea or vomiting Anesthetic complications: no   No notable events documented.   Last Vitals:  Vitals:   04/13/21 0854 04/13/21 0903  BP: 109/64 109/66  Pulse: 76 74  Resp: 13 14  Temp: (!) 36.1 C (!) 36.1 C  SpO2: 98% 99%    Last Pain:  Vitals:   04/13/21 0903  TempSrc: Temporal  PainSc: 0-No pain                 [place] Ouch

## 2021-04-13 NOTE — Discharge Instructions (Addendum)
Implanted Port Insertion, Care After ?The following information offers guidance on how to care for yourself after your procedure. Your health care provider may also give you more specific instructions. If you have problems or questions, contact your health care provider. ?What can I expect after the procedure? ?After the procedure, it is common to have: ?Discomfort at the port insertion site. ?Bruising on the skin over the port. This should improve over 3-4 days. ?Follow these instructions at home: ?Port care ?After your port is placed, you will get a manufacturer's information card. The card has information about your port. Keep this card with you at all times. ?Take care of the port as told by your health care provider. Ask your health care provider if you or a family member can get training for taking care of the port at home. ?A home health care nurse will be be available to help care for the port. ?Make sure to remember what type of port you have. ?Incision care ?Washing hands with soap and water. ? ?  ?Two stitched incisions. One is normal. The other is red with pus and infected.  ? ?Follow instructions from your health care provider about how to take care of your port insertion site. Make sure you: ?Wash your hands with soap and water for at least 20 seconds before and after you change your bandage (dressing). If soap and water are not available, use hand sanitizer. ?Change your dressing as told by your health care provider. ?Leave stitches (sutures), skin glue, or adhesive strips in place. These skin closures may need to stay in place for 2 weeks or longer. If adhesive strip edges start to loosen and curl up, you may trim the loose edges. Do not remove adhesive strips completely unless your health care provider tells you to do that. ?Check your port insertion site every day for signs of infection. Check for: ?Redness, swelling, or pain. ?Fluid or blood. ?Warmth. ?Pus or a bad smell. ?Activity ?Return to your  normal activities as told by your health care provider. Ask your health care provider what activities are safe for you. ?You may have to avoid lifting. Ask your health care provider how much you can safely lift. ?General instructions ?Take over-the-counter and prescription medicines only as told by your health care provider. ?Do not take baths, swim, or use a hot tub until your health care provider approves. Ask your health care provider if you may take showers. You may only be allowed to take sponge baths. ?If you were given a sedative during the procedure, it can affect you for several hours. Do not drive or operate machinery until your health care provider says that it is safe. ?Wear a medical alert bracelet in case of an emergency. This will tell any health care providers that you have a port. ?Keep all follow-up visits. This is important. ?Contact a health care provider if: ?You cannot flush your port with saline as directed, or you cannot draw blood from the port. ?You have a fever or chills. ?You have redness, swelling, or pain around your port insertion site. ?You have fluid or blood coming from your port insertion site. ?Your port insertion site feels warm to the touch. ?You have pus or a bad smell coming from the port insertion site. ?Get help right away if: ?You have chest pain or shortness of breath. ?You have bleeding from your port that you cannot control. ?These symptoms may be an emergency. Get help right away. Call 911. ?Do not   wait to see if the symptoms will go away. ?Do not drive yourself to the hospital. ?Summary ?Take care of the port as told by your health care provider. Keep the manufacturer's information card with you at all times. ?Change your dressing as told by your health care provider. ?Contact a health care provider if you have a fever or chills or if you have redness, swelling, or pain around your port insertion site. ?Keep all follow-up visits. ?This information is not intended to  replace advice given to you by your health care provider. Make sure you discuss any questions you have with your health care provider. ?Document Revised: 08/30/2020 Document Reviewed: 08/30/2020 ?Elsevier Patient Education ? 2022 Elsevier Inc. ? ?AMBULATORY SURGERY  ?DISCHARGE INSTRUCTIONS ? ? ?The drugs that you were given will stay in your system until tomorrow so for the next 24 hours you should not: ? ?Drive an automobile ?Make any legal decisions ?Drink any alcoholic beverage ? ? ?You may resume regular meals tomorrow.  Today it is better to start with liquids and gradually work up to solid foods. ? ?You may eat anything you prefer, but it is better to start with liquids, then soup and crackers, and gradually work up to solid foods. ? ? ?Please notify your doctor immediately if you have any unusual bleeding, trouble breathing, redness and pain at the surgery site, drainage, fever, or pain not relieved by medication. ? ? ? ?Additional Instructions: ? ? ? ? ? ? ? ?Please contact your physician with any problems or Same Day Surgery at [phone], Monday through Friday 6 am to 4 pm, or [place] [facility] [place] Main number at [phone].  ?   ? ?

## 2021-04-13 NOTE — Transfer of Care (Signed)
Immediate Anesthesia Transfer of Care Note  Patient: Tracy Gardner  Procedure(s) Performed: INSERTION PORT-A-CATH  Patient Location: PACU  Anesthesia Type:General  Level of Consciousness: drowsy  Airway & Oxygen Therapy: Patient Spontanous Breathing and Patient connected to face mask oxygen  Post-op Assessment: Report given to RN  Post vital signs: stable  Last Vitals:  Vitals Value Taken Time  BP    Temp    Pulse 80 04/13/21 0830  Resp 19 04/13/21 0830  SpO2 98 % 04/13/21 0830  Vitals shown include unvalidated device data.  Last Pain: There were no vitals filed for this visit.       Complications: No notable events documented.

## 2021-04-13 NOTE — Op Note (Addendum)
°  Pre-operative Diagnosis: Colorectal CA  Post-operative Diagnosis: Same   Surgeon: Caroleen Hamman, MD FACS  Anesthesia:LMA general, marcaine .25% w epi and lidocaine 1%  Procedure: right IJ  Port placement with fluoroscopy under U/S guidance  Findings: Good position of the tip of the catheter by fluoroscopy  Estimated Blood Loss: Minimal         Drains: None         Specimens: None       Complications: none          Procedure Details  The patient was seen again in the Holding Room. The benefits, complications, treatment options, and expected outcomes were discussed with the patient. The risks of bleeding, infection, recurrence of symptoms, failure to resolve symptoms,  thrombosis nonfunction breakage pneumothorax hemopneumothorax any of which could require chest tube or further surgery were reviewed with the patient.   The patient was taken to Operating Room, identified as Tracy Gardner and the procedure verified.  A Time Out was held and the above information confirmed.  Prior to the induction of general anesthesia, antibiotic prophylaxis was administered. VTE prophylaxis was in place. Appropriate anesthesia was then administered and tolerated well. The chest was prepped with Chloraprep and draped in the sterile fashion. The patient was positioned in the supine position. Then the patient was placed in Trendelenburg position.  Patient was prepped and draped in sterile fashion and in a Trendelenburg position local anesthetic was infiltrated into the skin and subcutaneous tissues in the neck and anterior chest wall. The large bore needle was placed into the internal jugular vein under U/S guidance without difficulty and then the Seldinger wire was advanced. Fluoroscopy was utilized to confirm that the Seldinger wire was in the superior vena cava.  An incision was made and a port pocket developed with blunt and electrocautery dissection. The introducer dilator was placed over the Seldinger  wire the wire was removed. The previously flushed catheter was placed into the introducer dilator and the peel-away sheath was removed. The catheter length was confirmed and trimmed utilizing fluoroscopy for proper positioning. The catheter was then attached to the previously flushed port. The port was placed into the pocket. The port was held in with 2-0 Prolenes and flushed for function and heparin locked.  The wound was closed with interrupted 3-0 Vicryl followed by 4-0 subcuticular Monocryl sutures. Dermabond used to coat the skin  Patient was taken to the recovery room in stable condition where a postoperative chest film has been ordered.

## 2021-04-14 ENCOUNTER — Telehealth: Payer: Self-pay | Admitting: Licensed Clinical Social Worker

## 2021-04-14 ENCOUNTER — Encounter: Payer: Self-pay | Admitting: Licensed Clinical Social Worker

## 2021-04-14 DIAGNOSIS — I10 Essential (primary) hypertension: Secondary | ICD-10-CM | POA: Diagnosis not present

## 2021-04-14 DIAGNOSIS — I482 Chronic atrial fibrillation, unspecified: Secondary | ICD-10-CM | POA: Diagnosis not present

## 2021-04-14 DIAGNOSIS — I420 Dilated cardiomyopathy: Secondary | ICD-10-CM | POA: Diagnosis not present

## 2021-04-14 DIAGNOSIS — Z09 Encounter for follow-up examination after completed treatment for conditions other than malignant neoplasm: Secondary | ICD-10-CM | POA: Diagnosis not present

## 2021-04-14 NOTE — Progress Notes (Signed)
°   [place] Work  Initial Assessment   Tracy Gardner is a 56 y.o. year old male contacted by phone. Clinical Social Work was referred by Dr. Burlene Arnt for assessment of psychosocial needs.   SDOH (Social Determinants of Health) assessments performed: Yes   Distress Screen completed: No No flowsheet data found.    Family/Social Information:  Housing Arrangement: patient lives with spouse , Tracy Gardner [phone] Family members/support persons in your life? Family Transportation concerns: no  Employment: Out on work excuse. 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 Type of concern: Medical bills Food access concerns: no Religious or spiritual practice: yes Services Currently in place:  N/A  Coping/ Adjustment to diagnosis: Patient understands treatment plan and what happens next? yes Concerns about diagnosis and/or treatment: Losing my job and How I will pay for the services I need Patient reported stressors: Finances and Adjusting to my illness Hopes and priorities: To get better and be able to work again Patient enjoys time with family/ friends Current coping skills/ strengths: Ability for insight , Active sense of humor , Average or above average intelligence , Capable of independent living , Motivation for treatment/growth , and Supportive family/friends     SUMMARY: Current SDOH Barriers:  Financial constraints related to not working during treatment and surgeries  Clinical Social Work Clinical Goal(s):  patient will work with [employer] and Hotel manager to address needs related to concerns about medical bills and loss of income due to illness.  Interventions: Discussed common feeling and emotions when being diagnosed with cancer, and the importance of support during treatment Informed patient of the support  team roles and support services at [facility] Provided CSW contact information and encouraged patient to call with any questions or concerns Referred patient to [facility] and [place] and Provided patient with information about resources available within [facility] and community and counseling support provided by CSW.   Follow Up Plan: Patient will contact CSW with any support or resource needs and CSW will follow-up with patient by phone  Patient verbalizes understanding of plan: Yes    Adelene Amas , LCSW

## 2021-04-17 ENCOUNTER — Encounter: Payer: Self-pay | Admitting: Licensed Clinical Social Worker

## 2021-04-17 ENCOUNTER — Other Ambulatory Visit: Payer: Self-pay

## 2021-04-17 ENCOUNTER — Other Ambulatory Visit: Payer: Self-pay | Admitting: *Deleted

## 2021-04-17 ENCOUNTER — Telehealth: Payer: Self-pay | Admitting: Licensed Clinical Social Worker

## 2021-04-17 NOTE — Patient Outreach (Signed)
Tracy Gardner) Care Management Geriatric Nurse Practitioner Note   04/17/2021 Name:  Tracy Gardner MRN:  [id number] DOB:  1965/06/07  Summary: Pt post op recovering going as expected. Will start chemo next week.  Recommendations/Changes made from today's visit: Continue to follow oncology instructions and calendar. Call oncology if any problems occur.  Subjective: Tracy Gardner is an 56 y.o. year old male who is a primary patient of [name], PA-C. The care management team was consulted for assistance with care management and/or care coordination needs.    Geriatric Nurse Practitioner completed Telephone Visit today.   Objective:  Medications Reviewed Today     Reviewed by Kathyrn Drown, RN (Registered Nurse) on 04/13/21 at [phone]  Med List Status: Complete   Medication Order Taking? Sig Documenting Provider Last Dose Status Informant  albuterol (VENTOLIN HFA) 108 (90 Base) MCG/ACT inhaler [id number] Yes Inhale 2 puffs into the lungs every 6 (six) hours as needed for wheezing or shortness of breath. [provider]  Active Spouse/Significant Other  amiodarone (PACERONE) 200 MG tablet [id number] Yes Take 1 tablet (200 mg total) by mouth daily. Hold for HR less then 50 Minna Merritts, MD 04/13/2021 0500 Active Spouse/Significant Other  apixaban (ELIQUIS) 5 MG TABS tablet [id number]  Take 5 mg by mouth 2 (two) times daily.  Patient not taking: Reported on 04/12/2021   [provider]  Active Pharmacy Records           Med Note Briant Cedar, [facility]   Tue Apr 04, 2021 10:26 PM) Last filled 12/16/20 90 day supply  bisoprolol (ZEBETA) 5 MG tablet [id number] No Take 0.5 tablets (2.5 mg total) by mouth daily.  Patient not taking: Reported on 04/12/2021   Minna Merritts, MD Not Taking Consider Medication Status and Discontinue Spouse/Significant Other  cetirizine (ZYRTEC) 10 MG tablet [id number] Yes Take 10 mg by mouth daily. [provider]   Active Spouse/Significant Other  doxylamine, Sleep, (UNISOM) 25 MG tablet [id number] Yes Take 25 mg by mouth at bedtime. [provider]  Active Spouse/Significant Other  empagliflozin (JARDIANCE) 10 MG TABS tablet [id number]  Take 1 tablet (10 mg total) by mouth daily.  Patient not taking: Reported on 04/12/2021     Active Spouse/Significant Other           Med Note Kenton Kingfisher, [facility]   Tue Apr 04, 2021 10:07 AM) On hold per physician instruction due to low blood sugars   fluticasone (FLOVENT HFA) 110 MCG/ACT inhaler [id number] Yes Inhale into the lungs 2 (two) times daily.  Patient taking differently: Inhale 1 puff into the lungs 2 (two) times daily as needed (asthma).     Active Spouse/Significant Other  furosemide (LASIX) 20 MG tablet [id number]  Take 10 mg by mouth daily.  Patient not taking: Reported on 04/12/2021   [provider]  Active Spouse/Significant Other           Med Note Sharene Butters   Tue Apr 04, 2021 10:06 AM) On hold per physician instruction due to low blood pressure  ibuprofen (ADVIL) 600 MG tablet [id number] Yes Take 1 tablet (600 mg total) by mouth every 6 (six) hours as needed.  Patient not taking: Reported on 04/12/2021   Tylene Fantasia, PA-C  Active Spouse/Significant Other  loperamide (IMODIUM A-D) 2 MG tablet [id number]  Take 1 tablet (2 mg total) by mouth 4 (four) times daily as needed for diarrhea or loose stools. Antonieta Pert, MD  Active   Multiple Vitamins-Minerals (MULTIVITAMIN WITH MINERALS) tablet [id number] Yes Take 1 tablet by mouth daily. [provider]  Active Spouse/Significant Other  omeprazole (PRILOSEC) 20 MG capsule [id number] Yes Take 1 capsule (20 mg total) by mouth 2 (two) times daily for 14 days.  Patient taking differently: Take 20 mg by mouth in the morning.   Virgel Manifold, MD 04/13/2021 0500 Active Spouse/Significant Other, Self           Med Note Kenton Kingfisher, [facility]   Tue Apr 04, 2021 10:08 AM)    ondansetron  (ZOFRAN) 4 MG tablet [id number] Yes Take 1 tablet (4 mg total) by mouth every 8 (eight) hours as needed for nausea or vomiting. Jules Husbands, MD Past Month Active   oxyCODONE (OXY IR/ROXICODONE) 5 MG immediate release tablet [id number] Yes Take 1-2 tablets (5-10 mg total) by mouth every 4 (four) hours as needed for moderate pain.  Patient not taking: Reported on 04/10/2021   Tylene Fantasia, PA-C  Active Spouse/Significant Other  rosuvastatin (CRESTOR) 10 MG tablet [id number] Yes Take 1 tablet (10 mg total) by mouth daily.  Patient taking differently: Take 10 mg by mouth every evening.   Minna Merritts, MD  Active Spouse/Significant Other             SDOH:  (Social Determinants of Health) assessments and interventions performed:    Care Plan  Review of patient past medical history, allergies, medications, health status, including review of consultants reports, laboratory and other test data, was performed as part of comprehensive evaluation for care management services.   Care Plan : [place] Management Plan of Care  Updates made by Deloria Lair, NP since 04/17/2021 12:00 AM     Problem: Adenocarcinoma of colon   Priority: High  Onset Date: 03/29/2021     Goal: Patient will follow post operative instructions and attend all post op appts over the next 30 days.   Start Date: 03/30/2021  Expected End Date: 04/28/2021  Recent Progress: On track  Priority: High  Note:   Update 04/17/21 (Status: Goal on Track (progressing): YES.) Short Term Goal  Evaluation of current treatment plan related to cancer and patient's adherence to plan as established by provider Reviewed scheduled/upcoming provider appointments including Pt will begin chemotherapy next week. Wife is involved with all the planning and support for his treatment.  Update 04/10/21:  (Status: Goal on Track (progressing): YES.) Short Term Goal  Evaluation of current treatment plan related to cancer treatment and patient's  adherence to plan as established by provider Advised patient to call MD, or NP for assistance if any problems arise. Reviewed scheduled/upcoming provider appointments including Discussed plans with patient for ongoing care management follow up and provided patient with direct contact information for care management team Support Mrs. Oler in her efforts to get her FMLA to care for her husband. We discussed this today. Documents in support of this need to be signed by oncologist this week.  Current Barriers:  Knowledge Deficits related to plan of care for management of cancer.   RNCM Clinical Goal(s):  Patient will verbalize basic understanding of cancer disease process and self health management plan as evidenced by telephone conversations. take all medications exactly as prescribed and will call provider for medication related questions as evidenced by pt report.    attend all scheduled medical appointments: surgical and oncology as evidenced by pt report and chart review.        work with [place]  Team to appropriately treat cancer as evidenced by pt report and chart review  through collaboration with RN Care manager, provider, and care team.   Interventions: Inter-disciplinary care team collaboration (see longitudinal plan of care) Evaluation of current treatment plan related to  self management and patient's adherence to plan as established by provider  Patient Goals/Self-Care Activities: Attend all scheduled provider appointments Call provider office for new concerns or questions  Participate in [place] Management calls        Plan: Telephone follow up appointment with care management team member scheduled for:  next week 05/08/20. Pt agrees to plan of care.  Eulah Pont. Myrtie Neither, MSN, [facility] Gerontological Nurse Practitioner [facility] Care Management [phone]

## 2021-04-18 ENCOUNTER — Telehealth: Payer: Self-pay | Admitting: *Deleted

## 2021-04-18 ENCOUNTER — Other Ambulatory Visit: Payer: Self-pay | Admitting: Internal Medicine

## 2021-04-18 ENCOUNTER — Inpatient Hospital Stay: Payer: 59

## 2021-04-18 ENCOUNTER — Telehealth: Payer: Self-pay | Admitting: Internal Medicine

## 2021-04-18 NOTE — Telephone Encounter (Signed)
Faxed FMLA paperwork for her spouse to help take care of him Marcie Bal Oros) to [employer] at [phone]

## 2021-04-18 NOTE — Telephone Encounter (Signed)
Pt needs to reschedule his Chemo class for this morning. He is not going to make it per VM.

## 2021-04-18 NOTE — Progress Notes (Signed)
ON PATHWAY REGIMEN - Colorectal  No Change  Continue With Treatment as Ordered.  Original Decision Date/Time: 04/12/2021 15:32     A cycle is every 14 days:     Oxaliplatin      Leucovorin      Fluorouracil      Fluorouracil   **Always confirm dose/schedule in your pharmacy ordering system**  Patient Characteristics: Postoperative without Neoadjuvant Therapy (Pathologic Staging), Colon, Stage III, Low Risk (pT1-3, pN1) Tumor Location: Colon Therapeutic Status: Postoperative without Neoadjuvant Therapy (Pathologic Staging) AJCC M Category: cM0 AJCC T Category: pT3 AJCC N Category: pN1b AJCC 8 Stage Grouping: IIIB Intent of Therapy: Curative Intent, Discussed with Patient

## 2021-04-20 ENCOUNTER — Inpatient Hospital Stay: Payer: 59

## 2021-04-20 NOTE — Progress Notes (Signed)
Tumor Board Documentation  Tracy Gardner was presented by Dr Rogue Bussing at our Tumor Board on 04/20/2021, which included representatives from medical oncology, surgical, pharmacy, pulmonology, genetics, radiology, pathology, nutrition, research, navigation, radiation oncology, internal medicine, palliative care.  Tracy Gardner currently presents as a current patient, for [place], for new positive pathology with history of the following treatments: surgical intervention(s).  Additionally, we reviewed previous medical and familial history, history of present illness, and recent lab results along with all available histopathologic and imaging studies. The tumor board considered available treatment options and made the following recommendations: Adjuvant chemotherapy Refer to Genetics  The following procedures/referrals were also placed: No orders of the defined types were placed in this encounter.   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  Tumor board is a meeting of clinicians from various specialty areas who evaluate and discuss patients for whom a multidisciplinary approach is being considered. Final determinations in the plan of care are those of the provider(s). The responsibility for follow up of recommendations given during tumor board is that of the provider.   Todays extended care, comprehensive team conference, Tracy Gardner was not present for the discussion and was not examined.   Multidisciplinary Tumor Board is a multidisciplinary case peer review process.  Decisions discussed in the Multidisciplinary Tumor Board reflect the opinions of the specialists present at the conference without having examined the patient.  Ultimately, treatment and diagnostic decisions rest with the primary provider(s) and the  patient.

## 2021-04-21 ENCOUNTER — Other Ambulatory Visit: Payer: Self-pay

## 2021-04-21 ENCOUNTER — Other Ambulatory Visit: Payer: Self-pay | Admitting: Internal Medicine

## 2021-04-21 ENCOUNTER — Inpatient Hospital Stay: Payer: 59

## 2021-04-21 DIAGNOSIS — C187 Malignant neoplasm of sigmoid colon: Secondary | ICD-10-CM

## 2021-04-21 MED ORDER — PROCHLORPERAZINE MALEATE 10 MG PO TABS
10.0000 mg | ORAL_TABLET | Freq: Four times a day (QID) | ORAL | 1 refills | Status: DC | PRN
  Filled 2021-04-21: qty 40, 10d supply, fill #0

## 2021-04-21 MED ORDER — LIDOCAINE-PRILOCAINE 2.5-2.5 % EX CREA
TOPICAL_CREAM | CUTANEOUS | 3 refills | Status: DC
  Filled 2021-04-21: qty 30, 30d supply, fill #0

## 2021-04-21 NOTE — Progress Notes (Signed)
Nutrition Assessment   Reason for Assessment:  Referral from Dr B regarding diet education - new ileostomy   ASSESSMENT:  56 year old male with stage III colon cancer.  Past medical history of cardiomyopathy, CKD, afib, DM, HTN, nephrectomy, HLD. Patient s/p ileostomy on 1/12.  Readmission with dehydation, AKI, likely ileus vs edema at ileostomy.  Planning folfox chemotherapy.  Met with patient and wife following chemo education.  Patient reports that his appetite is great and feels the best he has felt in several months.     Medications: jardiance, lasix, imodium, MVI   Labs: reviewed   Anthropometrics:   Height: 71 inches Weight: 238 lb 9.6 oz on 2/1 255 lb 03/23/21 BMI: 33  7% weight loss in 2 weeks, significant   Estimated Energy Needs  Kcals: 2400-2700 Protein: 120-135 g Fluid: 2400-2700 ml   NUTRITION DIAGNOSIS: Inadequate oral intake related to cancer, surgery, readmission to hospital as evidenced by 7% weight loss in 2 weeks and poor appetite although appetite improved recently   INTERVENTION:  Discussed diet after ileostomy.  Handout provided.   Encouraged good sources of protein during treatment. Encouraged weight maintenance during treatment.   Message sent to MD regarding patient needing port cream, zofran and compazine sent to pharmacy.   Contact information provided   MONITORING, EVALUATION, GOAL: weight trends, intake   Next Visit: during 2nd cycle of chemotherapy  Selisa Tensley B. Zenia Resides, [place], [place] Registered Dietitian [phone] (mobile)

## 2021-04-24 DIAGNOSIS — Z85038 Personal history of other malignant neoplasm of large intestine: Secondary | ICD-10-CM | POA: Diagnosis not present

## 2021-04-24 DIAGNOSIS — Z932 Ileostomy status: Secondary | ICD-10-CM | POA: Diagnosis not present

## 2021-04-25 ENCOUNTER — Inpatient Hospital Stay: Payer: 59

## 2021-04-25 ENCOUNTER — Other Ambulatory Visit: Payer: Self-pay

## 2021-04-25 ENCOUNTER — Encounter: Payer: Self-pay | Admitting: Internal Medicine

## 2021-04-25 ENCOUNTER — Inpatient Hospital Stay (HOSPITAL_BASED_OUTPATIENT_CLINIC_OR_DEPARTMENT_OTHER): Payer: 59 | Admitting: Internal Medicine

## 2021-04-25 VITALS — BP 116/71 | HR 62 | Resp 16

## 2021-04-25 DIAGNOSIS — C187 Malignant neoplasm of sigmoid colon: Secondary | ICD-10-CM

## 2021-04-25 DIAGNOSIS — K56609 Unspecified intestinal obstruction, unspecified as to partial versus complete obstruction: Secondary | ICD-10-CM | POA: Diagnosis not present

## 2021-04-25 DIAGNOSIS — G4730 Sleep apnea, unspecified: Secondary | ICD-10-CM | POA: Diagnosis not present

## 2021-04-25 DIAGNOSIS — C19 Malignant neoplasm of rectosigmoid junction: Secondary | ICD-10-CM | POA: Diagnosis not present

## 2021-04-25 DIAGNOSIS — I4891 Unspecified atrial fibrillation: Secondary | ICD-10-CM | POA: Diagnosis not present

## 2021-04-25 DIAGNOSIS — R809 Proteinuria, unspecified: Secondary | ICD-10-CM | POA: Diagnosis not present

## 2021-04-25 DIAGNOSIS — D751 Secondary polycythemia: Secondary | ICD-10-CM | POA: Diagnosis not present

## 2021-04-25 DIAGNOSIS — E860 Dehydration: Secondary | ICD-10-CM | POA: Diagnosis not present

## 2021-04-25 DIAGNOSIS — N179 Acute kidney failure, unspecified: Secondary | ICD-10-CM | POA: Diagnosis not present

## 2021-04-25 DIAGNOSIS — Z5111 Encounter for antineoplastic chemotherapy: Secondary | ICD-10-CM | POA: Diagnosis not present

## 2021-04-25 DIAGNOSIS — C189 Malignant neoplasm of colon, unspecified: Secondary | ICD-10-CM | POA: Diagnosis not present

## 2021-04-25 LAB — CBC WITH DIFFERENTIAL/PLATELET
Abs Immature Granulocytes: 0.09 10*3/uL — ABNORMAL HIGH (ref 0.00–0.07)
Basophils Absolute: 0.1 10*3/uL (ref 0.0–0.1)
Basophils Relative: 2 %
Eosinophils Absolute: 0.2 10*3/uL (ref 0.0–0.5)
Eosinophils Relative: 5 %
HCT: 38.3 % — ABNORMAL LOW (ref 39.0–52.0)
Hemoglobin: 11.6 g/dL — ABNORMAL LOW (ref 13.0–17.0)
Immature Granulocytes: 2 %
Lymphocytes Relative: 19 %
Lymphs Abs: 0.9 10*3/uL (ref 0.7–4.0)
MCH: 24.3 pg — ABNORMAL LOW (ref 26.0–34.0)
MCHC: 30.3 g/dL (ref 30.0–36.0)
MCV: 80.1 fL (ref 80.0–100.0)
Monocytes Absolute: 0.4 10*3/uL (ref 0.1–1.0)
Monocytes Relative: 9 %
Neutro Abs: 3.1 10*3/uL (ref 1.7–7.7)
Neutrophils Relative %: 63 %
Platelets: 171 10*3/uL (ref 150–400)
RBC: 4.78 MIL/uL (ref 4.22–5.81)
RDW: 22.5 % — ABNORMAL HIGH (ref 11.5–15.5)
WBC: 4.8 10*3/uL (ref 4.0–10.5)
nRBC: 0 % (ref 0.0–0.2)

## 2021-04-25 LAB — FERRITIN: Ferritin: 14 ng/mL — ABNORMAL LOW (ref 24–336)

## 2021-04-25 LAB — COMPREHENSIVE METABOLIC PANEL
ALT: 31 U/L (ref 0–44)
AST: 29 U/L (ref 15–41)
Albumin: 3.7 g/dL (ref 3.5–5.0)
Alkaline Phosphatase: 69 U/L (ref 38–126)
Anion gap: 7 (ref 5–15)
BUN: 12 mg/dL (ref 6–20)
CO2: 25 mmol/L (ref 22–32)
Calcium: 8.8 mg/dL — ABNORMAL LOW (ref 8.9–10.3)
Chloride: 102 mmol/L (ref 98–111)
Creatinine, Ser: 1.35 mg/dL — ABNORMAL HIGH (ref 0.61–1.24)
GFR, Estimated: >60 mL/min (ref >60)
Glucose, Bld: 183 mg/dL — ABNORMAL HIGH (ref 70–99)
Potassium: 4 mmol/L (ref 3.5–5.1)
Sodium: 134 mmol/L — ABNORMAL LOW (ref 135–145)
Total Bilirubin: 0.3 mg/dL (ref 0.3–1.2)
Total Protein: 7.1 g/dL (ref 6.5–8.1)

## 2021-04-25 LAB — IRON AND TIBC
Iron: 154 ug/dL (ref 45–182)
Saturation Ratios: 38 % (ref 17.9–39.5)
TIBC: 403 ug/dL (ref 250–450)
UIBC: 249 ug/dL

## 2021-04-25 MED ORDER — SODIUM CHLORIDE 0.9 % IV SOLN
2400.0000 mg/m2 | INTRAVENOUS | Status: DC
  Administered 2021-04-25: 5600 mg via INTRAVENOUS
  Filled 2021-04-25: qty 112

## 2021-04-25 MED ORDER — FREESTYLE LITE TEST VI STRP
ORAL_STRIP | 0 refills | Status: AC
  Filled 2021-04-25: qty 100, 30d supply, fill #0

## 2021-04-25 MED ORDER — PALONOSETRON HCL INJECTION 0.25 MG/5ML
0.2500 mg | Freq: Once | INTRAVENOUS | Status: AC
  Administered 2021-04-25: 0.25 mg via INTRAVENOUS
  Filled 2021-04-25: qty 5

## 2021-04-25 MED ORDER — DEXTROSE 5 % IV SOLN
Freq: Once | INTRAVENOUS | Status: AC
  Filled 2021-04-25: qty 250

## 2021-04-25 MED ORDER — OXALIPLATIN CHEMO INJECTION 100 MG/20ML
85.0000 mg/m2 | Freq: Once | INTRAVENOUS | Status: AC
  Administered 2021-04-25: 200 mg via INTRAVENOUS
  Filled 2021-04-25: qty 40

## 2021-04-25 MED ORDER — LEUCOVORIN CALCIUM INJECTION 350 MG
950.0000 mg | Freq: Once | INTRAVENOUS | Status: AC
  Administered 2021-04-25: 950 mg via INTRAVENOUS
  Filled 2021-04-25: qty 47.5

## 2021-04-25 MED ORDER — BLOOD GLUCOSE MONITOR KIT
PACK | 0 refills | Status: AC
  Filled 2021-04-25: qty 1, 30d supply, fill #0

## 2021-04-25 MED ORDER — FREESTYLE LANCETS MISC
12 refills | Status: AC
  Filled 2021-04-25: qty 100, 30d supply, fill #0
  Filled 2021-11-01: qty 100, 30d supply, fill #1

## 2021-04-25 MED ORDER — SODIUM CHLORIDE 0.9 % IV SOLN
10.0000 mg | Freq: Once | INTRAVENOUS | Status: AC
  Administered 2021-04-25: 10 mg via INTRAVENOUS
  Filled 2021-04-25: qty 10

## 2021-04-25 MED ORDER — OMRON 3 SERIES BP MONITOR DEVI
0 refills | Status: AC
Start: 2021-04-25 — End: ?
  Filled 2021-04-25: qty 1, 1d supply, fill #0

## 2021-04-25 MED ORDER — SODIUM CHLORIDE 0.9% FLUSH
10.0000 mL | INTRAVENOUS | Status: DC | PRN
  Administered 2021-04-25 (×2): 10 mL
  Filled 2021-04-25: qty 10

## 2021-04-25 NOTE — Patient Instructions (Addendum)
#  Stop bisoprolol until further instructions  #Do not take any Advil Motrin Aleve-because of concern for kidney problems.  #Check your blood pressure at home-once or twice; bring a log to the next visit.  #Check of blood glucose 3 times a day-fasting in the morning/after lunch/after dinner; bring a log to the next visit.

## 2021-04-25 NOTE — Assessment & Plan Note (Addendum)
#   Stage III colon cancer T3N2-rectosigmoid; distal 5 mm margin; MSS. Adjuvant chemo FOLFOX q 2w x12.   #Proceed with FOLFOX cycle 1 today of planned #12 treatments. Labs today reviewed;  acceptable for treatment today.   # Solitary kidney- GFR- creat- 1.3 [GFR >60]; recent acute renal failure [JAN 2023-creatinine-5.3]-prerenal status post IV hydration; currently back to baseline at 1.3. Again reviewed with the patient the importance of hydration/blood pressure given his history of solitary kidney.  Discussed regarding being proactive with monitoring diarrhea.  # Anemia- mild 11; ? IDA- check iron studies; plan IV iron if low.  # Borderline hypotension- 91/46- Stop bisoprolol until further instructions; Do not take any Advil Motrin Aleve-because of concern for kidney problems; Check your blood pressure at home-once or twice; bring a log to the next visit.  # Loose stool in the colostomy bag-s/p colonic surgery -s.p Nutrition evaluation Improved.   # IV access:Functional.   # Hx of Afib-eliquis/amiodarone-monitor closely for bleeding tendencies on chemotherapy-STABLE.   # Elevated Blood glucose- PBF- 183; non-diabetic; check BLood glucose TID; and bring a log .   # DISPOSITION:   # ADD iron studies/ferrtin # chemo today # in 1 week- labs- cbc/bmp; possible IVFs 1 lit over 1 hour # follow up in 2 weeks- MD; labs-cbc/cmp; FOLFOX; pump off D-3 - Dr.B

## 2021-04-25 NOTE — Patient Instructions (Signed)
[facility] AT [place]   Discharge Instructions: Thank you for choosing [facility] to provide your oncology and hematology care.  If you have a lab appointment with the [facility], please go directly to the [facility] and check in at the registration area.   Wear comfortable clothing and clothing appropriate for easy access to any Portacath or PICC line.   We strive to give you quality time with your provider. You may need to reschedule your appointment if you arrive late (15 or more minutes).  Arriving late affects you and other patients whose appointments are after yours.  Also, if you miss three or more appointments without notifying the office, you may be dismissed from the clinic at the providers discretion.      For prescription refill requests, have your pharmacy contact our office and allow 72 hours for refills to be completed.    Today you received the following chemotherapy and/or immunotherapy agents: Oxaliplatin, Leucovorin, Fluorouracil.      To help prevent nausea and vomiting after your treatment, we encourage you to take your nausea medication as directed.  BELOW ARE SYMPTOMS THAT SHOULD BE REPORTED IMMEDIATELY: *FEVER GREATER THAN 100.4 F (38 C) OR HIGHER *CHILLS OR SWEATING *NAUSEA AND VOMITING THAT IS NOT CONTROLLED WITH YOUR NAUSEA MEDICATION *UNUSUAL SHORTNESS OF BREATH *UNUSUAL BRUISING OR BLEEDING *URINARY PROBLEMS (pain or burning when urinating, or frequent urination) *BOWEL PROBLEMS (unusual diarrhea, constipation, pain near the anus) TENDERNESS IN MOUTH AND THROAT WITH OR WITHOUT PRESENCE OF ULCERS (sore throat, sores in mouth, or a toothache) UNUSUAL RASH, SWELLING OR PAIN  UNUSUAL VAGINAL DISCHARGE OR ITCHING   Items with * indicate a potential emergency and should be followed up as soon as possible or go to the Emergency Department if any problems should occur.  Please show the CHEMOTHERAPY ALERT CARD or  IMMUNOTHERAPY ALERT CARD at check-in to the Emergency Department and triage nurse.  Should you have questions after your visit or need to cancel or reschedule your appointment, please contact [facility]  Dept: [phone]  and follow the prompts.  Office hours are 8:00 a.m. to 4:30 p.m. Monday - Friday. Please note that voicemails left after 4:00 p.m. may not be returned until the following business day.  We are closed weekends and major holidays. You have access to a nurse at all times for urgent questions. Please call the main number to the clinic Dept: [phone] and follow the prompts.  For any non-urgent questions, you may also contact your provider using MyChart. We now offer e-Visits for anyone 56 and older to request care online for non-urgent symptoms. For details visit [email or web address].   Also download the MyChart app! Go to the app store, search "MyChart", open the app, select [facility], and log in with your MyChart username and password.  Due to Covid, a mask is required upon entering the hospital/clinic. If you do not have a mask, one will be given to you upon arrival. For doctor visits, patients may have 1 support person aged 56 or older with them. For treatment visits, patients cannot have anyone with them due to current Covid guidelines and our immunocompromised population.
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
## 2021-04-25 NOTE — Progress Notes (Signed)
5825- MD visit Blood Pressure: 91/41 and Pulse Rate: 52. MD, Dr. Rogue Bussing, aware. Per MD order: okay to proceed with scheduled Oxaliplatin, Leucovorin, and Fluorouracil treatment today.

## 2021-04-26 ENCOUNTER — Encounter: Payer: Self-pay | Admitting: Surgery

## 2021-04-27 ENCOUNTER — Inpatient Hospital Stay: Payer: 59

## 2021-04-27 ENCOUNTER — Other Ambulatory Visit: Payer: Self-pay

## 2021-04-27 ENCOUNTER — Telehealth: Payer: Self-pay | Admitting: Internal Medicine

## 2021-04-27 VITALS — BP 108/69 | HR 43 | Temp 97.1°F | Resp 18

## 2021-04-27 DIAGNOSIS — I4891 Unspecified atrial fibrillation: Secondary | ICD-10-CM | POA: Diagnosis not present

## 2021-04-27 DIAGNOSIS — G4730 Sleep apnea, unspecified: Secondary | ICD-10-CM | POA: Diagnosis not present

## 2021-04-27 DIAGNOSIS — C19 Malignant neoplasm of rectosigmoid junction: Secondary | ICD-10-CM | POA: Diagnosis not present

## 2021-04-27 DIAGNOSIS — E860 Dehydration: Secondary | ICD-10-CM | POA: Diagnosis not present

## 2021-04-27 DIAGNOSIS — C187 Malignant neoplasm of sigmoid colon: Secondary | ICD-10-CM

## 2021-04-27 DIAGNOSIS — R809 Proteinuria, unspecified: Secondary | ICD-10-CM | POA: Diagnosis not present

## 2021-04-27 DIAGNOSIS — K56609 Unspecified intestinal obstruction, unspecified as to partial versus complete obstruction: Secondary | ICD-10-CM | POA: Diagnosis not present

## 2021-04-27 DIAGNOSIS — D751 Secondary polycythemia: Secondary | ICD-10-CM | POA: Diagnosis not present

## 2021-04-27 DIAGNOSIS — N179 Acute kidney failure, unspecified: Secondary | ICD-10-CM | POA: Diagnosis not present

## 2021-04-27 DIAGNOSIS — Z5111 Encounter for antineoplastic chemotherapy: Secondary | ICD-10-CM | POA: Diagnosis not present

## 2021-04-27 MED ORDER — SODIUM CHLORIDE 0.9% FLUSH
10.0000 mL | Freq: Once | INTRAVENOUS | Status: AC
  Administered 2021-04-27: 10 mL via INTRAVENOUS
  Filled 2021-04-27: qty 10

## 2021-04-27 MED ORDER — HEPARIN SOD (PORK) LOCK FLUSH 100 UNIT/ML IV SOLN
500.0000 [IU] | Freq: Once | INTRAVENOUS | Status: AC
  Administered 2021-04-27: 500 [IU] via INTRAVENOUS
  Filled 2021-04-27: qty 5

## 2021-04-27 NOTE — Telephone Encounter (Signed)
Pt wife called, stated she does not think pt needs fluids. States the pt is feeling well. I wanted to consult with team before cancelling appts.

## 2021-04-27 NOTE — Telephone Encounter (Signed)
Dr. B still wants patient to have labs drawn next week.  MyChart message sent to patient asking if he wold like to keep the IVF appt just in case or just do labs.

## 2021-04-28 ENCOUNTER — Other Ambulatory Visit: Payer: Self-pay | Admitting: *Deleted

## 2021-04-28 NOTE — Patient Outreach (Signed)
[facility]) Care Management Geriatric Nurse Practitioner Note   04/28/2021 Name:  Keymarion Bearman Mcandrew MRN:  [id number] DOB:  [birth year]  Summary: Pt is stable and successfully recovered from abdominal surgery and has had his first chemo tx.  Recommendations/Changes made from today's visit: Follow oncology and GI specialty instructions. Report problems immediately to prevent complications.  Subjective: Neftaly Swiss Braaten is an 56 y.o. year old male who is a primary patient of [name], PA-C. The care management team was consulted for assistance with care management and/or care coordination needs.    Geriatric Nurse Practitioner completed Telephone Visit today.   Allergies as of 04/28/2021   No Known Allergies      Medication List        Accurate as of April 28, 2021 10:05 AM. If you have any questions, ask your nurse or doctor.          albuterol 108 (90 Base) MCG/ACT inhaler Commonly known as: VENTOLIN HFA Inhale 2 puffs into the lungs every 6 (six) hours as needed for wheezing or shortness of breath.   albuterol 108 (90 Base) MCG/ACT inhaler Commonly known as: VENTOLIN HFA Inhale 2 puffs into the lungs 4 times a day as needed for shortness of breath, wheezing and cough   amiodarone 200 MG tablet Commonly known as: PACERONE Take 1 tablet (200 mg total) by mouth daily. Hold for HR less then 50   apixaban 5 MG Tabs tablet Commonly known as: ELIQUIS Take 5 mg by mouth 2 (two) times daily.   bisoprolol 5 MG tablet Commonly known as: ZEBETA Take 0.5 tablets (2.5 mg total) by mouth daily.   cetirizine 10 MG tablet Commonly known as: ZYRTEC Take 10 mg by mouth daily.   doxylamine (Sleep) 25 MG tablet Commonly known as: UNISOM Take 25 mg by mouth at bedtime.   Flovent HFA 110 MCG/ACT inhaler Generic drug: fluticasone Inhale into the lungs 2 (two) times daily. What changed:  how much to take when to take this reasons to take this    FreeStyle Freedom Lite w/Device Kit #Check blood glucose fasting/in the morning; after lunch; and after dinner.   freestyle lancets #Check blood glucose fasting/in the morning; after lunch; and after dinner.   FREESTYLE LITE test strip Generic drug: glucose blood Use as directed to test 3 times daily   furosemide 20 MG tablet Commonly known as: LASIX Take 10 mg by mouth daily.   HYDROcodone-acetaminophen 5-325 MG tablet Commonly known as: NORCO/VICODIN Take 1 tablet by mouth every 6 (six) hours as needed for moderate pain.   HYDROcodone-acetaminophen 5-325 MG tablet Commonly known as: NORCO/VICODIN Take 1 tablet by mouth every 6 hours as needed for moderate pain   ibuprofen 600 MG tablet Commonly known as: ADVIL Take 1 tablet (600 mg total) by mouth every 6 (six) hours as needed.   Jardiance 10 MG Tabs tablet Generic drug: empagliflozin Take 1 tablet (10 mg total) by mouth daily.   lidocaine-prilocaine cream Commonly known as: EMLA Apply to affected area once   loperamide 2 MG tablet Commonly known as: Imodium A-D Take 1 tablet (2 mg total) by mouth 4 (four) times daily as needed for diarrhea or loose stools.   multivitamin with minerals tablet Take 1 tablet by mouth daily.   omeprazole 20 MG capsule Commonly known as: PRILOSEC Take 1 capsule (20 mg total) by mouth 2 (two) times daily for 14 days. What changed: when to take this   Omron 3 Series  BP Monitor Devi Use as directed   ondansetron 4 MG tablet Commonly known as: Zofran Take 1 tablet (4 mg total) by mouth every 8 (eight) hours as needed for nausea or vomiting.   oxyCODONE 5 MG immediate release tablet Commonly known as: Oxy IR/ROXICODONE Take 1-2 tablets (5-10 mg total) by mouth every 4 (four) hours as needed for moderate pain.   prochlorperazine 10 MG tablet Commonly known as: COMPAZINE Take 1 tablet (10 mg total) by mouth every 6 (six) hours as needed for nausea or vomiting.   rosuvastatin 10  MG tablet Commonly known as: CRESTOR Take 1 tablet (10 mg total) by mouth daily. What changed: when to take this       Care Plan  Review of patient past medical history, allergies, medications, health status, including review of consultants reports, laboratory and other test data, was performed as part of comprehensive evaluation for care management services.   Care Plan : [place] Management Plan of Care  Updates made by Deloria Lair, NP since 04/28/2021 12:00 AM     Problem: Adenocarcinoma of colon   Priority: High  Onset Date: 03/29/2021     Goal: Patient will follow post operative instructions and attend all post op appts over the next 30 days. Completed 04/28/2021  Start Date: 03/30/2021  Expected End Date: 04/28/2021  Recent Progress: On track  Priority: High  Note:    Update 04/28/21:  (Status: Goal Met.) Short Term Goal  Evaluation of current treatment plan related to post operative status and patient's adherence to plan as established by provider       Mr. Cortese has fully recovered from his abdominal surgery. His ileostomy is functioning as expected without complications. He has attended his appts and followed all instructions. He   Update 04/17/21 (Status: Goal on Track (progressing): YES.) Short Term Goal  Evaluation of current treatment plan related to cancer and patient's adherence to plan as established by provider Reviewed scheduled/upcoming provider appointments including Pt will begin chemotherapy next week. Wife is involved with all the planning and support for his treatment.  Update 04/10/21:  (Status: Goal on Track (progressing): YES.) Short Term Goal  Evaluation of current treatment plan related to cancer treatment and patient's adherence to plan as established by provider Advised patient to call MD, or NP for assistance if any problems arise. Reviewed scheduled/upcoming provider appointments including Discussed plans with patient for ongoing care management  follow up and provided patient with direct contact information for care management team Support Mrs. Muscatello in her efforts to get her FMLA to care for her husband. We discussed this today. Documents in support of this need to be signed by oncologist this week.  Current Barriers:  Knowledge Deficits related to plan of care for management of cancer.   RNCM Clinical Goal(s):  Patient will verbalize basic understanding of cancer disease process and self health management plan as evidenced by telephone conversations. take all medications exactly as prescribed and will call provider for medication related questions as evidenced by pt report.    attend all scheduled medical appointments: surgical and oncology as evidenced by pt report and chart review.        work with [facility] Team to appropriately treat cancer as evidenced by pt report and chart review  through collaboration with RN Care manager, provider, and care team.   Interventions: Inter-disciplinary care team collaboration (see longitudinal plan of care) Evaluation of current treatment plan related to  self management and patient's adherence  to plan as established by provider  Patient Goals/Self-Care Activities: Attend all scheduled provider appointments Call provider office for new concerns or questions  Participate in [place] Management calls      Long-Range Goal: Patient will follow oncology plan of care as reported by pt over the next 60 days.   Start Date: 04/28/2021  Expected End Date: 07/07/2021  Priority: High  Note:   Current Barriers:  Knowledge Deficits related to plan of care for management of Adenocarcinoma of colon.   RNCM Clinical Goal(s):  Patient will verbalize understanding of plan for management of cancer as evidenced by sharing of information with NP each month. attend all scheduled medical appointments: Oncology as evidenced by pt report and chart review.        work with Oncology to progress through their  plan of care as evidenced by pt report and chart review.  through collaboration with RN Care manager, provider, and care team.   Interventions: Evaluation of current treatment plan related to  self management and patient's adherence to plan as established by provider       Patient has received one chemotherapy treatment yesterday. He tolerated the procedure well and is not having any immediate side         effects. He has his schedule for upcoming labs, appts and infusions. His wife is very supportive and available to him.   Patient Goals/Self-Care Activities: Attend all scheduled provider appointments Call provider office for new concerns or questions       Long-Range Goal: Patient will attend GI consults for preparation and plan to undergo reanastomosis of his bowel over the next 3 months as evidenced by pt report and chart review..   Start Date: 04/28/2021  Expected End Date: 08/09/2021  Priority: Medium  Note:   Current Barriers:  Knowledge Deficits related to plan of care for management of Adenocarcinoma of colon.   RNCM Clinical Goal(s):  Patient will Knowledge Deficits related to plan of care for management of Adenocarcinoma of colon, through collaboration with RN Care          manager, provider, and care team.   Interventions: Monitor Inter-disciplinary care team collaboration (see longitudinal plan of care) Evaluation of current treatment plan related to  self management and patient's adherence to plan as established by provider       Patient reports he will be having an upcoming appt with GI to evaluate if they can proceed with a plan to perform reanastomosis of his        bowel. He is able to care for his ileostomy by himself and with the aid of his wife. No complications since our last conversation. Has        received his supplies from [place].  Patient Goals/Self-Care Activities: Attend all scheduled provider appointments Perform all self care activities independently   Call provider office for new concerns or questions         Plan: Telephone follow up appointment with care management team member scheduled for:  March 20th.  Patient agrees to plan of care.  Eulah Pont. Myrtie Neither, MSN, [facility] Gerontological Nurse Practitioner [facility] Care Management [phone]

## 2021-04-28 NOTE — Telephone Encounter (Signed)
Spoke with Mrs. Hisle and advised that Dr. B wants Mr. Vaile to have his labs drawn next week.  Will keep the appt on 2/21 for lab/poss IVF just in case patient will need the fluids.

## 2021-05-01 ENCOUNTER — Encounter: Payer: Self-pay | Admitting: Surgery

## 2021-05-01 ENCOUNTER — Other Ambulatory Visit: Payer: Self-pay

## 2021-05-01 ENCOUNTER — Ambulatory Visit (INDEPENDENT_AMBULATORY_CARE_PROVIDER_SITE_OTHER): Payer: 59 | Admitting: Surgery

## 2021-05-01 VITALS — BP 109/69 | HR 71 | Temp 98.3°F | Ht 71.0 in | Wt 240.0 lb

## 2021-05-01 DIAGNOSIS — Z09 Encounter for follow-up examination after completed treatment for conditions other than malignant neoplasm: Secondary | ICD-10-CM

## 2021-05-01 DIAGNOSIS — C189 Malignant neoplasm of colon, unspecified: Secondary | ICD-10-CM

## 2021-05-01 NOTE — Patient Instructions (Addendum)
We will get you scheduled for a Gastrografin enema and then have you follow up here in 2-3 weeks.  We will call you about this exam.

## 2021-05-02 ENCOUNTER — Inpatient Hospital Stay: Payer: 59

## 2021-05-02 ENCOUNTER — Telehealth: Payer: Self-pay

## 2021-05-02 DIAGNOSIS — K56609 Unspecified intestinal obstruction, unspecified as to partial versus complete obstruction: Secondary | ICD-10-CM | POA: Diagnosis not present

## 2021-05-02 DIAGNOSIS — C187 Malignant neoplasm of sigmoid colon: Secondary | ICD-10-CM

## 2021-05-02 DIAGNOSIS — N179 Acute kidney failure, unspecified: Secondary | ICD-10-CM | POA: Diagnosis not present

## 2021-05-02 DIAGNOSIS — I4891 Unspecified atrial fibrillation: Secondary | ICD-10-CM | POA: Diagnosis not present

## 2021-05-02 DIAGNOSIS — Z95828 Presence of other vascular implants and grafts: Secondary | ICD-10-CM

## 2021-05-02 DIAGNOSIS — D751 Secondary polycythemia: Secondary | ICD-10-CM | POA: Diagnosis not present

## 2021-05-02 DIAGNOSIS — E860 Dehydration: Secondary | ICD-10-CM | POA: Diagnosis not present

## 2021-05-02 DIAGNOSIS — R809 Proteinuria, unspecified: Secondary | ICD-10-CM | POA: Diagnosis not present

## 2021-05-02 DIAGNOSIS — C19 Malignant neoplasm of rectosigmoid junction: Secondary | ICD-10-CM | POA: Diagnosis not present

## 2021-05-02 DIAGNOSIS — G4730 Sleep apnea, unspecified: Secondary | ICD-10-CM | POA: Diagnosis not present

## 2021-05-02 DIAGNOSIS — Z5111 Encounter for antineoplastic chemotherapy: Secondary | ICD-10-CM | POA: Diagnosis not present

## 2021-05-02 LAB — CBC WITH DIFFERENTIAL/PLATELET
Abs Immature Granulocytes: 0.03 10*3/uL (ref 0.00–0.07)
Basophils Absolute: 0 10*3/uL (ref 0.0–0.1)
Basophils Relative: 1 %
Eosinophils Absolute: 0.2 10*3/uL (ref 0.0–0.5)
Eosinophils Relative: 7 %
HCT: 38.6 % — ABNORMAL LOW (ref 39.0–52.0)
Hemoglobin: 12 g/dL — ABNORMAL LOW (ref 13.0–17.0)
Immature Granulocytes: 1 %
Lymphocytes Relative: 22 %
Lymphs Abs: 0.7 10*3/uL (ref 0.7–4.0)
MCH: 24.6 pg — ABNORMAL LOW (ref 26.0–34.0)
MCHC: 31.1 g/dL (ref 30.0–36.0)
MCV: 79.1 fL — ABNORMAL LOW (ref 80.0–100.0)
Monocytes Absolute: 0.3 10*3/uL (ref 0.1–1.0)
Monocytes Relative: 8 %
Neutro Abs: 2.1 10*3/uL (ref 1.7–7.7)
Neutrophils Relative %: 61 %
Platelets: 152 10*3/uL (ref 150–400)
RBC: 4.88 MIL/uL (ref 4.22–5.81)
RDW: 21.8 % — ABNORMAL HIGH (ref 11.5–15.5)
WBC: 3.4 10*3/uL — ABNORMAL LOW (ref 4.0–10.5)
nRBC: 0 % (ref 0.0–0.2)

## 2021-05-02 LAB — BASIC METABOLIC PANEL
Anion gap: 7 (ref 5–15)
BUN: 11 mg/dL (ref 6–20)
CO2: 23 mmol/L (ref 22–32)
Calcium: 8.8 mg/dL — ABNORMAL LOW (ref 8.9–10.3)
Chloride: 103 mmol/L (ref 98–111)
Creatinine, Ser: 1.07 mg/dL (ref 0.61–1.24)
GFR, Estimated: >60 mL/min (ref >60)
Glucose, Bld: 168 mg/dL — ABNORMAL HIGH (ref 70–99)
Potassium: 3.8 mmol/L (ref 3.5–5.1)
Sodium: 133 mmol/L — ABNORMAL LOW (ref 135–145)

## 2021-05-02 MED ORDER — SODIUM CHLORIDE 0.9% FLUSH
10.0000 mL | Freq: Once | INTRAVENOUS | Status: AC
  Administered 2021-05-02: 10 mL via INTRAVENOUS
  Filled 2021-05-02: qty 10

## 2021-05-02 MED ORDER — HEPARIN SOD (PORK) LOCK FLUSH 100 UNIT/ML IV SOLN
500.0000 [IU] | Freq: Once | INTRAVENOUS | Status: AC
  Administered 2021-05-02: 500 [IU] via INTRAVENOUS
  Filled 2021-05-02: qty 5

## 2021-05-02 MED ORDER — SODIUM CHLORIDE 0.9 % IV SOLN
INTRAVENOUS | Status: AC
  Filled 2021-05-02 (×2): qty 250

## 2021-05-02 NOTE — Progress Notes (Signed)
Pt here for IV fluids. Pt reports that is he doing well since his first treatment last week. He denies any side effects and endorses a good appetite. 1L NS given.

## 2021-05-02 NOTE — Telephone Encounter (Signed)
The patient is schedule for a Gastrografin enema at [facility] on 05/12/21 at 10:00 am. He will need to arrive at the [facility] entrance by 9:30 am. He will not need to do a prep as he has a colostomy. Patient is aware of date, time, and location.

## 2021-05-03 ENCOUNTER — Other Ambulatory Visit: Payer: Self-pay | Admitting: *Deleted

## 2021-05-03 DIAGNOSIS — C187 Malignant neoplasm of sigmoid colon: Secondary | ICD-10-CM

## 2021-05-03 NOTE — Progress Notes (Signed)
There is a 56 year old male with history of sigmoid adenocarcinoma extending to the rectum status post anterior resection ultralow with diverting loop ileostomy.  He did have a Port-A-Cath placed couple weeks ago and is doing well.  He is tolerating diet.  No fevers no chills he is doing okay with the ileostomy but wishes to have that reversed.  He has started chemotherapy   PE NAD Chest  Port-A-Cath in place without evidence of infection Abd : Soft nontender ileostomy in place.  Viable and widely patent.  A/P doing well patient wishes to have ileostomy reversal.  We can start the work-up with a Gastrografin enema.  Regarding timing I will touch base with oncology since he is actively doing chemotherapy.  Obviously reversal my delay chemotherapy and he will be up to the patient and colleagues regarding the final decision.  We will see him back in a few weeks once he completes the gastrografin enema

## 2021-05-09 ENCOUNTER — Inpatient Hospital Stay: Payer: 59

## 2021-05-09 ENCOUNTER — Inpatient Hospital Stay (HOSPITAL_BASED_OUTPATIENT_CLINIC_OR_DEPARTMENT_OTHER): Payer: 59 | Admitting: Internal Medicine

## 2021-05-09 ENCOUNTER — Other Ambulatory Visit: Payer: Self-pay

## 2021-05-09 ENCOUNTER — Encounter: Payer: Self-pay | Admitting: Internal Medicine

## 2021-05-09 DIAGNOSIS — C19 Malignant neoplasm of rectosigmoid junction: Secondary | ICD-10-CM | POA: Diagnosis not present

## 2021-05-09 DIAGNOSIS — Z5111 Encounter for antineoplastic chemotherapy: Secondary | ICD-10-CM | POA: Diagnosis not present

## 2021-05-09 DIAGNOSIS — I4891 Unspecified atrial fibrillation: Secondary | ICD-10-CM | POA: Diagnosis not present

## 2021-05-09 DIAGNOSIS — N179 Acute kidney failure, unspecified: Secondary | ICD-10-CM | POA: Diagnosis not present

## 2021-05-09 DIAGNOSIS — C187 Malignant neoplasm of sigmoid colon: Secondary | ICD-10-CM

## 2021-05-09 DIAGNOSIS — C189 Malignant neoplasm of colon, unspecified: Secondary | ICD-10-CM | POA: Diagnosis not present

## 2021-05-09 DIAGNOSIS — G4730 Sleep apnea, unspecified: Secondary | ICD-10-CM | POA: Diagnosis not present

## 2021-05-09 DIAGNOSIS — R809 Proteinuria, unspecified: Secondary | ICD-10-CM | POA: Diagnosis not present

## 2021-05-09 DIAGNOSIS — K56609 Unspecified intestinal obstruction, unspecified as to partial versus complete obstruction: Secondary | ICD-10-CM | POA: Diagnosis not present

## 2021-05-09 DIAGNOSIS — D751 Secondary polycythemia: Secondary | ICD-10-CM | POA: Diagnosis not present

## 2021-05-09 DIAGNOSIS — G4733 Obstructive sleep apnea (adult) (pediatric): Secondary | ICD-10-CM | POA: Diagnosis not present

## 2021-05-09 DIAGNOSIS — E860 Dehydration: Secondary | ICD-10-CM | POA: Diagnosis not present

## 2021-05-09 LAB — COMPREHENSIVE METABOLIC PANEL
ALT: 27 U/L (ref 0–44)
AST: 28 U/L (ref 15–41)
Albumin: 3.7 g/dL (ref 3.5–5.0)
Alkaline Phosphatase: 69 U/L (ref 38–126)
Anion gap: 8 (ref 5–15)
BUN: 12 mg/dL (ref 6–20)
CO2: 25 mmol/L (ref 22–32)
Calcium: 9.3 mg/dL (ref 8.9–10.3)
Chloride: 103 mmol/L (ref 98–111)
Creatinine, Ser: 1.35 mg/dL — ABNORMAL HIGH (ref 0.61–1.24)
GFR, Estimated: >60 mL/min (ref >60)
Glucose, Bld: 219 mg/dL — ABNORMAL HIGH (ref 70–99)
Potassium: 4 mmol/L (ref 3.5–5.1)
Sodium: 136 mmol/L (ref 135–145)
Total Bilirubin: 0.4 mg/dL (ref 0.3–1.2)
Total Protein: 7.1 g/dL (ref 6.5–8.1)

## 2021-05-09 LAB — CBC WITH DIFFERENTIAL/PLATELET
Abs Immature Granulocytes: 0.01 10*3/uL (ref 0.00–0.07)
Basophils Absolute: 0 10*3/uL (ref 0.0–0.1)
Basophils Relative: 1 %
Eosinophils Absolute: 0.2 10*3/uL (ref 0.0–0.5)
Eosinophils Relative: 7 %
HCT: 38.6 % — ABNORMAL LOW (ref 39.0–52.0)
Hemoglobin: 12.1 g/dL — ABNORMAL LOW (ref 13.0–17.0)
Immature Granulocytes: 0 %
Lymphocytes Relative: 29 %
Lymphs Abs: 0.7 10*3/uL (ref 0.7–4.0)
MCH: 24.9 pg — ABNORMAL LOW (ref 26.0–34.0)
MCHC: 31.3 g/dL (ref 30.0–36.0)
MCV: 79.4 fL — ABNORMAL LOW (ref 80.0–100.0)
Monocytes Absolute: 0.3 10*3/uL (ref 0.1–1.0)
Monocytes Relative: 11 %
Neutro Abs: 1.3 10*3/uL — ABNORMAL LOW (ref 1.7–7.7)
Neutrophils Relative %: 52 %
Platelets: 117 10*3/uL — ABNORMAL LOW (ref 150–400)
RBC: 4.86 MIL/uL (ref 4.22–5.81)
RDW: 21.8 % — ABNORMAL HIGH (ref 11.5–15.5)
WBC: 2.5 10*3/uL — ABNORMAL LOW (ref 4.0–10.5)
nRBC: 0 % (ref 0.0–0.2)

## 2021-05-09 MED ORDER — SODIUM CHLORIDE 0.9 % IV SOLN
10.0000 mg | Freq: Once | INTRAVENOUS | Status: AC
  Administered 2021-05-09: 10 mg via INTRAVENOUS
  Filled 2021-05-09: qty 10

## 2021-05-09 MED ORDER — OXALIPLATIN CHEMO INJECTION 100 MG/20ML
85.0000 mg/m2 | Freq: Once | INTRAVENOUS | Status: AC
  Administered 2021-05-09: 200 mg via INTRAVENOUS
  Filled 2021-05-09: qty 40

## 2021-05-09 MED ORDER — SODIUM CHLORIDE 0.9 % IV SOLN
2400.0000 mg/m2 | INTRAVENOUS | Status: DC
  Administered 2021-05-09: 5600 mg via INTRAVENOUS
  Filled 2021-05-09: qty 112

## 2021-05-09 MED ORDER — DEXTROSE 5 % IV SOLN
Freq: Once | INTRAVENOUS | Status: AC
  Filled 2021-05-09: qty 250

## 2021-05-09 MED ORDER — LEUCOVORIN CALCIUM INJECTION 350 MG
950.0000 mg | Freq: Once | INTRAVENOUS | Status: AC
  Administered 2021-05-09: 950 mg via INTRAVENOUS
  Filled 2021-05-09: qty 47.5

## 2021-05-09 MED ORDER — PALONOSETRON HCL INJECTION 0.25 MG/5ML
0.2500 mg | Freq: Once | INTRAVENOUS | Status: AC
  Administered 2021-05-09: 0.25 mg via INTRAVENOUS
  Filled 2021-05-09: qty 5

## 2021-05-09 NOTE — Progress Notes (Signed)
Patient denies new problems/concerns today.   °

## 2021-05-09 NOTE — Progress Notes (Signed)
Nutrition Follow-up:  Patient with stage III colon cancer.  Patient s/p ileostomy on 1/12.  Patient receiving folfox.   Met with patient during infusion.  Patient reports good appetite, eating well.  Denies nutrition impact symptoms.  Ileostomy functioning well.      Medications: reviewed  Labs: glucose 219, creatinine 1.35  Anthropometrics:   Weight 243 lb 3.2 oz today  238 lb 9.6 oz on 2/1 255 lb on 03/23/21   NUTRITION DIAGNOSIS: Inadequate oral intake improved   INTERVENTION:  Patient to continue eating well balanced diet with new ileostomy.   Encouraged good sources of protein    MONITORING, EVALUATION, GOAL: weight trends, intake   NEXT VISIT: Tuesday, March 14 during infusion  Mihran Lebarron B. Zenia Resides, [place], [place] Registered Dietitian [phone] (mobile)

## 2021-05-09 NOTE — Assessment & Plan Note (Addendum)
#   Stage III colon cancer T3N2-rectosigmoid; distal 5 mm margin; MSS. currently on adjuvant chemo FOLFOX q 2w x12.   #Proceed with FOLFOX cycle 2 today of planned #12 treatments. Labs today reviewed;  acceptable for treatment today. ANC 1.2; platelets-117.    # Solitary kidney- GFR- creat- 1.3 [GFR >60]; recent acute renal failure [JAN 2023-creatinine-5.3] currently back to baseline at 1.3. monitor closely.   # Anemia- mild 11; JAN 2023- IDA-plan Venofer.   # Loose stool in the colostomy bag-s/p colonic surgery -s.p Nutrition evaluation Improved; HOLD off reversal at this moment to avoid any unnecessary complications and break in adjuvant therapy.  Discussed with Dr.Pabon.   # Hx of Afib-eliquis/amiodarone-monitor closely for bleeding tendencies on chemotherapy-STABLE.   # Elevated Blood glucose- PBF- 219; non-diabetic; check BLood glucose TID; and bring a log .   # # IV access:port- Functional.   # Disability: Patient has applied for SSI/self employed truck driver.  I have stressed to the patient importance of adjuvant therapy/and not recommend any spacing out treatments/reducing the dose intensity at this time.  # DISPOSITION:  # chemo today # in 1 week- labs- cbc/bmp; venofer # follow up in 2 weeks- MD; labs-cbc/cmp; FOLFOX; pump off D-3; venofer  - Dr.B
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
## 2021-05-09 NOTE — Progress Notes (Signed)
Per Dr. Rogue Bussing, ok to proceed with treatment today with an [place] of 1.3 today.

## 2021-05-11 ENCOUNTER — Ambulatory Visit (INDEPENDENT_AMBULATORY_CARE_PROVIDER_SITE_OTHER): Payer: 59 | Admitting: Gastroenterology

## 2021-05-11 ENCOUNTER — Other Ambulatory Visit: Payer: Self-pay

## 2021-05-11 ENCOUNTER — Inpatient Hospital Stay: Payer: 59 | Attending: Internal Medicine

## 2021-05-11 ENCOUNTER — Encounter: Payer: Self-pay | Admitting: Gastroenterology

## 2021-05-11 VITALS — BP 112/62 | HR 51 | Temp 97.9°F | Wt 241.0 lb

## 2021-05-11 VITALS — BP 111/66 | HR 50 | Resp 18

## 2021-05-11 DIAGNOSIS — Z5189 Encounter for other specified aftercare: Secondary | ICD-10-CM | POA: Diagnosis not present

## 2021-05-11 DIAGNOSIS — K56609 Unspecified intestinal obstruction, unspecified as to partial versus complete obstruction: Secondary | ICD-10-CM | POA: Diagnosis not present

## 2021-05-11 DIAGNOSIS — Z125 Encounter for screening for malignant neoplasm of prostate: Secondary | ICD-10-CM | POA: Diagnosis not present

## 2021-05-11 DIAGNOSIS — D509 Iron deficiency anemia, unspecified: Secondary | ICD-10-CM | POA: Insufficient documentation

## 2021-05-11 DIAGNOSIS — Z932 Ileostomy status: Secondary | ICD-10-CM | POA: Insufficient documentation

## 2021-05-11 DIAGNOSIS — Z7901 Long term (current) use of anticoagulants: Secondary | ICD-10-CM | POA: Diagnosis not present

## 2021-05-11 DIAGNOSIS — I129 Hypertensive chronic kidney disease with stage 1 through stage 4 chronic kidney disease, or unspecified chronic kidney disease: Secondary | ICD-10-CM | POA: Insufficient documentation

## 2021-05-11 DIAGNOSIS — R768 Other specified abnormal immunological findings in serum: Secondary | ICD-10-CM

## 2021-05-11 DIAGNOSIS — E119 Type 2 diabetes mellitus without complications: Secondary | ICD-10-CM | POA: Diagnosis not present

## 2021-05-11 DIAGNOSIS — R5383 Other fatigue: Secondary | ICD-10-CM | POA: Insufficient documentation

## 2021-05-11 DIAGNOSIS — I4891 Unspecified atrial fibrillation: Secondary | ICD-10-CM | POA: Diagnosis not present

## 2021-05-11 DIAGNOSIS — E860 Dehydration: Secondary | ICD-10-CM | POA: Insufficient documentation

## 2021-05-11 DIAGNOSIS — Z87442 Personal history of urinary calculi: Secondary | ICD-10-CM | POA: Diagnosis not present

## 2021-05-11 DIAGNOSIS — Z0000 Encounter for general adult medical examination without abnormal findings: Secondary | ICD-10-CM | POA: Diagnosis not present

## 2021-05-11 DIAGNOSIS — Z833 Family history of diabetes mellitus: Secondary | ICD-10-CM | POA: Diagnosis not present

## 2021-05-11 DIAGNOSIS — Z5111 Encounter for antineoplastic chemotherapy: Secondary | ICD-10-CM | POA: Diagnosis not present

## 2021-05-11 DIAGNOSIS — C189 Malignant neoplasm of colon, unspecified: Secondary | ICD-10-CM | POA: Diagnosis not present

## 2021-05-11 DIAGNOSIS — Z933 Colostomy status: Secondary | ICD-10-CM | POA: Insufficient documentation

## 2021-05-11 DIAGNOSIS — N1831 Chronic kidney disease, stage 3a: Secondary | ICD-10-CM | POA: Insufficient documentation

## 2021-05-11 DIAGNOSIS — G4730 Sleep apnea, unspecified: Secondary | ICD-10-CM | POA: Insufficient documentation

## 2021-05-11 DIAGNOSIS — I10 Essential (primary) hypertension: Secondary | ICD-10-CM | POA: Diagnosis not present

## 2021-05-11 DIAGNOSIS — R200 Anesthesia of skin: Secondary | ICD-10-CM | POA: Insufficient documentation

## 2021-05-11 DIAGNOSIS — Z8249 Family history of ischemic heart disease and other diseases of the circulatory system: Secondary | ICD-10-CM | POA: Diagnosis not present

## 2021-05-11 DIAGNOSIS — N179 Acute kidney failure, unspecified: Secondary | ICD-10-CM | POA: Insufficient documentation

## 2021-05-11 DIAGNOSIS — E1122 Type 2 diabetes mellitus with diabetic chronic kidney disease: Secondary | ICD-10-CM | POA: Diagnosis not present

## 2021-05-11 DIAGNOSIS — C187 Malignant neoplasm of sigmoid colon: Secondary | ICD-10-CM | POA: Diagnosis not present

## 2021-05-11 DIAGNOSIS — Z905 Acquired absence of kidney: Secondary | ICD-10-CM | POA: Diagnosis not present

## 2021-05-11 DIAGNOSIS — Z79899 Other long term (current) drug therapy: Secondary | ICD-10-CM | POA: Insufficient documentation

## 2021-05-11 DIAGNOSIS — Z87891 Personal history of nicotine dependence: Secondary | ICD-10-CM | POA: Insufficient documentation

## 2021-05-11 DIAGNOSIS — I482 Chronic atrial fibrillation, unspecified: Secondary | ICD-10-CM | POA: Diagnosis not present

## 2021-05-11 MED ORDER — SODIUM CHLORIDE 0.9% FLUSH
10.0000 mL | INTRAVENOUS | Status: DC | PRN
  Administered 2021-05-11: 10 mL
  Filled 2021-05-11: qty 10

## 2021-05-11 MED ORDER — HEPARIN SOD (PORK) LOCK FLUSH 100 UNIT/ML IV SOLN
500.0000 [IU] | Freq: Once | INTRAVENOUS | Status: AC | PRN
  Administered 2021-05-11: 500 [IU]
  Filled 2021-05-11: qty 5

## 2021-05-11 NOTE — Progress Notes (Signed)
?  ?Jonathon Bellows MD, MRCP(U.K) ?[address]  ?[address]  ?[place], [place] [address]  ?Main: [phone]  ?Fax: [phone] ? ? ?Primary Care Physician: [name], PA-C ? ?Primary Gastroenterologist:  Dr. Jonathon Bellows  ? ?Chief Complaint  ?Patient presents with  ? New Patient (Initial Visit)  ?  Cancer of Sigmoid  ? ? ?HPI: Tracy Gardner is a 56 y.o. male ? ? ?Summary of history : ? ?He is here today for hospital follow-up.  Previously seen by Dr. Bonna Gains when he was admitted in December 2022 for anemia and GI bleed.  He has been on Eliquis. ?On 03/09/2021 underwent an EGD showed inflammation of the gastric antrum that was biopsied.  Underwent a colonoscopy at the same time there was a ulcerated partially obstructing mass in the sigmoid colon.  The scope was not able to pass 40 cm from the lesion.  Poor prep.  Biopsies were taken.  Biopsies of the mass showed invasive moderately differentiated adenocarcinoma.  H. pylori antibody was positive treated with 14 days of triple therapy. ? ?Seen Dr. Rogue Bussing subsequently underwent resection of the sigmoid mass.  In January 2023 postsurgery small bowel obstruction treated conservatively.  Metastatic involving 2 of 20 lymph nodes.  On chemotherapy. ? ?05/09/2021 hemoglobin 12.1 g with MCV of 79. ? ? ?He says that he received a prescription for physical therapy for H. pylori but never really took it due to concerns of drug interaction as he was on chemotherapy at that point.  The chemotherapy plan for at least 6 months at this point of time. ? ? ? ?Current Outpatient Medications  ?Medication Sig Dispense Refill  ? albuterol (VENTOLIN HFA) 108 (90 Base) MCG/ACT inhaler Inhale 2 puffs into the lungs 4 times a day as needed for shortness of breath, wheezing and cough 18 g 11  ? amiodarone (PACERONE) 200 MG tablet Take 1 tablet (200 mg total) by mouth daily. Hold for HR less then 50 90 tablet 3  ? apixaban (ELIQUIS) 5 MG TABS tablet Take 5 mg by mouth 2 (two)  times daily.    ? blood glucose meter kit and supplies KIT #Check blood glucose fasting/in the morning; after lunch; and after dinner. 1 each 0  ? Blood Pressure Monitoring (OMRON 3 SERIES BP MONITOR) DEVI Use as directed 1 each 0  ? cetirizine (ZYRTEC) 10 MG tablet Take 10 mg by mouth daily.    ? doxylamine, Sleep, (UNISOM) 25 MG tablet Take 25 mg by mouth at bedtime.    ? empagliflozin (JARDIANCE) 10 MG TABS tablet Take 1 tablet (10 mg total) by mouth daily. 30 tablet 11  ? fluticasone (FLOVENT HFA) 110 MCG/ACT inhaler Inhale into the lungs 2 (two) times daily. 12 g 12  ? glucose blood (FREESTYLE LITE) test strip Use as directed to test 3 times daily 100 each 0  ? Lancets (FREESTYLE) lancets #Check blood glucose fasting/in the morning; after lunch; and after dinner. 100 each 12  ? lidocaine-prilocaine (EMLA) cream Apply to affected area once 30 g 3  ? loperamide (IMODIUM A-D) 2 MG tablet Take 1 tablet (2 mg total) by mouth 4 (four) times daily as needed for diarrhea or loose stools. 30 tablet 0  ? Multiple Vitamins-Minerals (MULTIVITAMIN WITH MINERALS) tablet Take 1 tablet by mouth daily.    ? ondansetron (ZOFRAN) 4 MG tablet Take 1 tablet (4 mg total) by mouth every 8 (eight) hours as needed for nausea or vomiting. 20 tablet 0  ?  prochlorperazine (COMPAZINE) 10 MG tablet Take 1 tablet (10 mg total) by mouth every 6 (six) hours as needed for nausea or vomiting. 40 tablet 1  ? rosuvastatin (CRESTOR) 10 MG tablet Take 1 tablet (10 mg total) by mouth daily. (Patient taking differently: Take 10 mg by mouth every evening.) 30 tablet 11  ? ?No current facility-administered medications for this visit.  ? ? ?Allergies as of 05/11/2021  ? (No Known Allergies)  ? ? ?ROS: ? ?General: Negative for anorexia, weight loss, fever, chills, fatigue, weakness. ?ENT: Negative for hoarseness, difficulty swallowing , nasal congestion. ?CV: Negative for chest pain, angina, palpitations, dyspnea on exertion, peripheral edema.   ?Respiratory: Negative for dyspnea at rest, dyspnea on exertion, cough, sputum, wheezing.  ?GI: See history of present illness. ?GU:  Negative for dysuria, hematuria, urinary incontinence, urinary frequency, nocturnal urination.  ?Endo: Negative for unusual weight change.  ?  ?Physical Examination: ? ? BP 112/62   Pulse (!) 51   Temp 97.9 ?F (36.6 ?C) (Oral)   Wt 241 lb (109.3 kg)   BMI 33.61 kg/m?  ? ?General: Well-nourished, well-developed in no acute distress.  ?Eyes: No icterus. Conjunctivae pink. ?Mouth: Oropharyngeal mucosa moist and pink , no lesions erythema or exudate. ?Neuro: Alert and oriented x 3.  Grossly intact. ?Skin: Warm and dry, no jaundice.   ?Psych: Alert and cooperative, normal mood and affect. ? ? ?Imaging Studies: ?DG CHEST PORT 1 VIEW ? ?Result Date: 04/13/2021 ?CLINICAL DATA:  S/P PICC central line placement Z95.828 (ICD-10-CM) EXAM: PORTABLE CHEST 1 VIEW COMPARISON:  04/04/2021. FINDINGS: Right IJ Port-A-Cath with the tip projecting at the superior right atrium. No visible pneumothorax on this semi erect radiograph. No pleural effusions. No consolidation. Cardiomediastinal silhouette is similar. IMPRESSION: Right IJ Port-A-Cath with the tip projecting at the superior right atrium. No visible pneumothorax. Electronically Signed   By: Margaretha Sheffield M.D.   On: 04/13/2021 08:46  ? ?DG C-Arm 1-60 Min-No Report ? ?Result Date: 04/13/2021 ?Fluoroscopy was utilized by the requesting physician.  No radiographic interpretation.   ? ?Assessment and Plan:  ? ?Tracy Gardner is a 56 y.o. y/o male was recently admitted in December 2023 with melena.  Endoscopy and colonoscopy demonstrated sigmoid cancer , underwent surgery and has a colostomy at this point of time  and is undergoing chemotherapy.  Gastritis was noted on endoscopy and hence H. pylori serology was obtained that was positive and was advised to get treatment.  He never did complete the antibiotic treatment as he was  on chemotherapy.  I  explained to him that serology is not very sensitive.  Advised him that once he finishes his chemotherapy in 6 months time we will test him with a breath test which is more sensitive and if he does have H. pylori we will treat him at that point of time.  I also discussed about colon cancer screening and his siblings and his children due to his personal history ? ? ?Dr Jonathon Bellows  MD,MRCP [facility]) ?Follow up in November 2023 ?

## 2021-05-12 ENCOUNTER — Telehealth: Payer: Self-pay

## 2021-05-12 ENCOUNTER — Ambulatory Visit
Admission: RE | Admit: 2021-05-12 | Discharge: 2021-05-12 | Disposition: A | Discharge status: Home / Self Care | Payer: 59 | Source: Ambulatory Visit | Attending: Surgery | Admitting: Surgery

## 2021-05-12 DIAGNOSIS — Z09 Encounter for follow-up examination after completed treatment for conditions other than malignant neoplasm: Secondary | ICD-10-CM | POA: Insufficient documentation

## 2021-05-12 DIAGNOSIS — C189 Malignant neoplasm of colon, unspecified: Secondary | ICD-10-CM | POA: Diagnosis not present

## 2021-05-12 DIAGNOSIS — Z85038 Personal history of other malignant neoplasm of large intestine: Secondary | ICD-10-CM | POA: Diagnosis not present

## 2021-05-12 MED ORDER — IOHEXOL 300 MG/ML  SOLN
450.0000 mL | Freq: Once | INTRAMUSCULAR | Status: AC | PRN
  Administered 2021-05-12: 450 mL

## 2021-05-12 NOTE — Telephone Encounter (Signed)
-----   Message from Jules Husbands, MD sent at 05/12/2021 12:10 PM EST ----- ?Please let him know his enema study looks good, please keep f/u w me ?----- Message ----- ?From: Interface, Rad Results In ?Sent: 05/12/2021  11:26 AM EST ?To: Jules Husbands, MD ? ? ?

## 2021-05-12 NOTE — Telephone Encounter (Signed)
Notified patient as instructed, patient pleased. Discussed follow-up appointments, patient agrees  

## 2021-05-15 ENCOUNTER — Other Ambulatory Visit: Payer: Self-pay

## 2021-05-16 ENCOUNTER — Other Ambulatory Visit: Payer: Self-pay

## 2021-05-16 ENCOUNTER — Other Ambulatory Visit: Payer: Self-pay | Admitting: Cardiovascular Disease

## 2021-05-16 ENCOUNTER — Inpatient Hospital Stay: Payer: 59

## 2021-05-16 VITALS — BP 120/67 | HR 57 | Temp 98.6°F | Resp 18

## 2021-05-16 DIAGNOSIS — K56609 Unspecified intestinal obstruction, unspecified as to partial versus complete obstruction: Secondary | ICD-10-CM | POA: Diagnosis not present

## 2021-05-16 DIAGNOSIS — Z5111 Encounter for antineoplastic chemotherapy: Secondary | ICD-10-CM | POA: Diagnosis not present

## 2021-05-16 DIAGNOSIS — D500 Iron deficiency anemia secondary to blood loss (chronic): Secondary | ICD-10-CM

## 2021-05-16 DIAGNOSIS — D751 Secondary polycythemia: Secondary | ICD-10-CM

## 2021-05-16 DIAGNOSIS — C187 Malignant neoplasm of sigmoid colon: Secondary | ICD-10-CM | POA: Diagnosis not present

## 2021-05-16 DIAGNOSIS — E860 Dehydration: Secondary | ICD-10-CM | POA: Diagnosis not present

## 2021-05-16 DIAGNOSIS — Z5189 Encounter for other specified aftercare: Secondary | ICD-10-CM | POA: Diagnosis not present

## 2021-05-16 DIAGNOSIS — N179 Acute kidney failure, unspecified: Secondary | ICD-10-CM | POA: Diagnosis not present

## 2021-05-16 DIAGNOSIS — R5383 Other fatigue: Secondary | ICD-10-CM | POA: Diagnosis not present

## 2021-05-16 DIAGNOSIS — Z933 Colostomy status: Secondary | ICD-10-CM | POA: Diagnosis not present

## 2021-05-16 DIAGNOSIS — R200 Anesthesia of skin: Secondary | ICD-10-CM | POA: Diagnosis not present

## 2021-05-16 LAB — CBC WITH DIFFERENTIAL/PLATELET
Abs Immature Granulocytes: 0.01 10*3/uL (ref 0.00–0.07)
Basophils Absolute: 0 10*3/uL (ref 0.0–0.1)
Basophils Relative: 2 %
Eosinophils Absolute: 0.2 10*3/uL (ref 0.0–0.5)
Eosinophils Relative: 6 %
HCT: 38.2 % — ABNORMAL LOW (ref 39.0–52.0)
Hemoglobin: 12.2 g/dL — ABNORMAL LOW (ref 13.0–17.0)
Immature Granulocytes: 0 %
Lymphocytes Relative: 23 %
Lymphs Abs: 0.6 10*3/uL — ABNORMAL LOW (ref 0.7–4.0)
MCH: 24.8 pg — ABNORMAL LOW (ref 26.0–34.0)
MCHC: 31.9 g/dL (ref 30.0–36.0)
MCV: 77.8 fL — ABNORMAL LOW (ref 80.0–100.0)
Monocytes Absolute: 0.2 10*3/uL (ref 0.1–1.0)
Monocytes Relative: 8 %
Neutro Abs: 1.6 10*3/uL — ABNORMAL LOW (ref 1.7–7.7)
Neutrophils Relative %: 61 %
Platelets: 144 10*3/uL — ABNORMAL LOW (ref 150–400)
RBC: 4.91 MIL/uL (ref 4.22–5.81)
RDW: 21.5 % — ABNORMAL HIGH (ref 11.5–15.5)
WBC: 2.6 10*3/uL — ABNORMAL LOW (ref 4.0–10.5)
nRBC: 0 % (ref 0.0–0.2)

## 2021-05-16 LAB — BASIC METABOLIC PANEL
Anion gap: 5 (ref 5–15)
BUN: 12 mg/dL (ref 6–20)
CO2: 24 mmol/L (ref 22–32)
Calcium: 8.5 mg/dL — ABNORMAL LOW (ref 8.9–10.3)
Chloride: 105 mmol/L (ref 98–111)
Creatinine, Ser: 1.16 mg/dL (ref 0.61–1.24)
GFR, Estimated: >60 mL/min (ref >60)
Glucose, Bld: 155 mg/dL — ABNORMAL HIGH (ref 70–99)
Potassium: 3.8 mmol/L (ref 3.5–5.1)
Sodium: 134 mmol/L — ABNORMAL LOW (ref 135–145)

## 2021-05-16 MED ORDER — SODIUM CHLORIDE 0.9 % IV SOLN: 200.0000 mg | Freq: Once | INTRAVENOUS | Status: DC

## 2021-05-16 MED ORDER — IRON SUCROSE 20 MG/ML IV SOLN
200.0000 mg | Freq: Once | INTRAVENOUS | Status: AC
  Administered 2021-05-16: 200 mg via INTRAVENOUS
  Filled 2021-05-16: qty 10

## 2021-05-16 MED ORDER — ELIQUIS 5 MG PO TABS
ORAL_TABLET | ORAL | 5 refills | Status: DC
  Filled 2021-05-16: qty 60, 30d supply, fill #0

## 2021-05-16 MED ORDER — HEPARIN SOD (PORK) LOCK FLUSH 100 UNIT/ML IV SOLN
500.0000 [IU] | Freq: Once | INTRAVENOUS | Status: AC
  Administered 2021-05-16: 500 [IU] via INTRAVENOUS
  Filled 2021-05-16: qty 5

## 2021-05-16 MED ORDER — SODIUM CHLORIDE 0.9 % IV SOLN
Freq: Once | INTRAVENOUS | Status: AC
  Filled 2021-05-16: qty 250

## 2021-05-16 MED FILL — Apixaban Tab 5 MG: ORAL | 90 days supply | Qty: 180 | Fill #0 | Status: AC

## 2021-05-16 NOTE — Telephone Encounter (Signed)
Prescription refill request for Eliquis received. ?Indication: PAF ?Last office visit: 09/16/20 ?Scr: 1.16 on 05/16/21 ?Age: 56 ?Weight: 118.6kg ? ?Based on above findings Elqiuis '5mg'$  twice daily is the appropriate dose.  Refill approved x1. ?Pt is past due for appt with Dr Rockey Situ.  Message sent to schedulers to make appt.   ? ?

## 2021-05-16 NOTE — Telephone Encounter (Signed)
Refill Request.  

## 2021-05-17 ENCOUNTER — Encounter: Payer: Self-pay | Admitting: Surgery

## 2021-05-17 ENCOUNTER — Ambulatory Visit (INDEPENDENT_AMBULATORY_CARE_PROVIDER_SITE_OTHER): Payer: 59 | Admitting: Surgery

## 2021-05-17 VITALS — BP 115/69 | HR 64 | Temp 98.0°F | Ht 71.0 in | Wt 242.0 lb

## 2021-05-17 DIAGNOSIS — C187 Malignant neoplasm of sigmoid colon: Secondary | ICD-10-CM

## 2021-05-17 NOTE — Telephone Encounter (Signed)
Received compliance report on patient. He is 97% compliant with use the last 90 days. Pressure 10-15cm h20 (13.9cm h20-95%) with residual AHI 0.2. No changes needed.  ?

## 2021-05-17 NOTE — Patient Instructions (Signed)
Follow up here in one month. Dr Dahlia Byes will speak with Dr B.  ? ? ? ?Please call and ask to speak with a nurse if you develop questions or concerns. ? ?

## 2021-05-20 NOTE — Progress Notes (Signed)
Medardo is a 56 year old male well-known to me with a history of low anterior resection for rectosigmoid junction cancer status post diverting loop ileostomy.  He is doing well.  He has had a Gastrografin enema showing no evidence of stricture.  He wishes to have the ileostomy reversed.  I have discussed this with Dr.Brahmanday and he was hesitant about potentially missing chemotherapy sessions. ? ?PE NAD ?Abd: soft, ileostomy pink and patent. Incisons healed. ? ?A/p we will see him in 1 month or so.  At that time we will know a little bit more while he stands.  I will touch base again with Dr. Rogue Bussing about optimal timing for ileostomy reversal.  No need for emergent surgical intervention at this time. ?Most of this visit was around ileostomy reversal . The procedure, risks, benefits and possible complications. ?Please note that I spent greater than 40 minutes in this encounter including coordination of her care, counseling the patient, personally reviewing imaging studies, placing orders and performing appropriate documentation ? ?

## 2021-05-22 ENCOUNTER — Other Ambulatory Visit: Payer: Self-pay

## 2021-05-23 ENCOUNTER — Inpatient Hospital Stay: Payer: 59

## 2021-05-23 ENCOUNTER — Encounter: Payer: Self-pay | Admitting: Internal Medicine

## 2021-05-23 ENCOUNTER — Inpatient Hospital Stay (HOSPITAL_BASED_OUTPATIENT_CLINIC_OR_DEPARTMENT_OTHER): Payer: 59 | Admitting: Internal Medicine

## 2021-05-23 ENCOUNTER — Other Ambulatory Visit: Payer: Self-pay

## 2021-05-23 DIAGNOSIS — Z5189 Encounter for other specified aftercare: Secondary | ICD-10-CM | POA: Diagnosis not present

## 2021-05-23 DIAGNOSIS — K56609 Unspecified intestinal obstruction, unspecified as to partial versus complete obstruction: Secondary | ICD-10-CM | POA: Diagnosis not present

## 2021-05-23 DIAGNOSIS — Z933 Colostomy status: Secondary | ICD-10-CM | POA: Diagnosis not present

## 2021-05-23 DIAGNOSIS — C187 Malignant neoplasm of sigmoid colon: Secondary | ICD-10-CM

## 2021-05-23 DIAGNOSIS — Z5111 Encounter for antineoplastic chemotherapy: Secondary | ICD-10-CM | POA: Diagnosis not present

## 2021-05-23 DIAGNOSIS — R200 Anesthesia of skin: Secondary | ICD-10-CM | POA: Diagnosis not present

## 2021-05-23 DIAGNOSIS — E860 Dehydration: Secondary | ICD-10-CM | POA: Diagnosis not present

## 2021-05-23 DIAGNOSIS — C189 Malignant neoplasm of colon, unspecified: Secondary | ICD-10-CM | POA: Diagnosis not present

## 2021-05-23 DIAGNOSIS — N179 Acute kidney failure, unspecified: Secondary | ICD-10-CM | POA: Diagnosis not present

## 2021-05-23 DIAGNOSIS — R5383 Other fatigue: Secondary | ICD-10-CM | POA: Diagnosis not present

## 2021-05-23 LAB — COMPREHENSIVE METABOLIC PANEL
ALT: 35 U/L (ref 0–44)
AST: 31 U/L (ref 15–41)
Albumin: 4 g/dL (ref 3.5–5.0)
Alkaline Phosphatase: 85 U/L (ref 38–126)
Anion gap: 7 (ref 5–15)
BUN: 18 mg/dL (ref 6–20)
CO2: 20 mmol/L — ABNORMAL LOW (ref 22–32)
Calcium: 9 mg/dL (ref 8.9–10.3)
Chloride: 107 mmol/L (ref 98–111)
Creatinine, Ser: 1.46 mg/dL — ABNORMAL HIGH (ref 0.61–1.24)
GFR, Estimated: 56 mL/min — ABNORMAL LOW (ref >60)
Glucose, Bld: 174 mg/dL — ABNORMAL HIGH (ref 70–99)
Potassium: 3.8 mmol/L (ref 3.5–5.1)
Sodium: 134 mmol/L — ABNORMAL LOW (ref 135–145)
Total Bilirubin: 0.9 mg/dL (ref 0.3–1.2)
Total Protein: 7.9 g/dL (ref 6.5–8.1)

## 2021-05-23 LAB — CBC WITH DIFFERENTIAL/PLATELET
Abs Immature Granulocytes: 0.02 10*3/uL (ref 0.00–0.07)
Basophils Absolute: 0 10*3/uL (ref 0.0–0.1)
Basophils Relative: 2 %
Eosinophils Absolute: 0.1 10*3/uL (ref 0.0–0.5)
Eosinophils Relative: 2 %
HCT: 46.2 % (ref 39.0–52.0)
Hemoglobin: 14.7 g/dL (ref 13.0–17.0)
Immature Granulocytes: 1 %
Lymphocytes Relative: 34 %
Lymphs Abs: 0.9 10*3/uL (ref 0.7–4.0)
MCH: 25.4 pg — ABNORMAL LOW (ref 26.0–34.0)
MCHC: 31.8 g/dL (ref 30.0–36.0)
MCV: 79.8 fL — ABNORMAL LOW (ref 80.0–100.0)
Monocytes Absolute: 0.4 10*3/uL (ref 0.1–1.0)
Monocytes Relative: 17 %
Neutro Abs: 1.2 10*3/uL — ABNORMAL LOW (ref 1.7–7.7)
Neutrophils Relative %: 44 %
Platelets: 116 10*3/uL — ABNORMAL LOW (ref 150–400)
RBC: 5.79 MIL/uL (ref 4.22–5.81)
RDW: 24.4 % — ABNORMAL HIGH (ref 11.5–15.5)
WBC: 2.6 10*3/uL — ABNORMAL LOW (ref 4.0–10.5)
nRBC: 0 % (ref 0.0–0.2)

## 2021-05-23 MED ORDER — DEXTROSE 5 % IV SOLN
Freq: Once | INTRAVENOUS | Status: AC
  Filled 2021-05-23: qty 250

## 2021-05-23 MED ORDER — LEUCOVORIN CALCIUM INJECTION 350 MG
408.0000 mg/m2 | Freq: Once | INTRAVENOUS | Status: AC
  Administered 2021-05-23: 950 mg via INTRAVENOUS
  Filled 2021-05-23: qty 47.5

## 2021-05-23 MED ORDER — OXALIPLATIN CHEMO INJECTION 100 MG/20ML
85.0000 mg/m2 | Freq: Once | INTRAVENOUS | Status: AC
  Administered 2021-05-23: 200 mg via INTRAVENOUS
  Filled 2021-05-23: qty 40

## 2021-05-23 MED ORDER — SODIUM CHLORIDE 0.9 % IV SOLN
2400.0000 mg/m2 | INTRAVENOUS | Status: DC
  Administered 2021-05-23: 5600 mg via INTRAVENOUS
  Filled 2021-05-23: qty 112

## 2021-05-23 MED ORDER — PALONOSETRON HCL INJECTION 0.25 MG/5ML
0.2500 mg | Freq: Once | INTRAVENOUS | Status: AC
  Administered 2021-05-23: 0.25 mg via INTRAVENOUS
  Filled 2021-05-23: qty 5

## 2021-05-23 MED ORDER — SODIUM CHLORIDE 0.9 % IV SOLN
10.0000 mg | Freq: Once | INTRAVENOUS | Status: AC
  Administered 2021-05-23: 10 mg via INTRAVENOUS
  Filled 2021-05-23: qty 10

## 2021-05-23 NOTE — Progress Notes (Signed)
Pt states he has had some sinus issues this past week. ? ?After last treatment the numbness in hands increased. ? ? ?

## 2021-05-23 NOTE — Patient Instructions (Signed)
[facility]  Discharge Instructions: °Thank you for choosing [facility] to provide your oncology and hematology care.  ° °If you have a lab appointment with the Cancer Center, please go directly to the Cancer Center and check in at the registration area. °  °Wear comfortable clothing and clothing appropriate for easy access to any Portacath or PICC line.  ° °We strive to give you quality time with your provider. You may need to reschedule your appointment if you arrive late (15 or more minutes).  Arriving late affects you and other patients whose appointments are after yours.  Also, if you miss three or more appointments without notifying the office, you may be dismissed from the clinic at the provider’s discretion.    °  °For prescription refill requests, have your pharmacy contact our office and allow 72 hours for refills to be completed.   ° °Today you received the following chemotherapy and/or immunotherapy agents     °  °To help prevent nausea and vomiting after your treatment, we encourage you to take your nausea medication as directed. ° °BELOW ARE SYMPTOMS THAT SHOULD BE REPORTED IMMEDIATELY: °*FEVER GREATER THAN 100.4 F (38 °C) OR HIGHER °*CHILLS OR SWEATING °*NAUSEA AND VOMITING THAT IS NOT CONTROLLED WITH YOUR NAUSEA MEDICATION °*UNUSUAL SHORTNESS OF BREATH °*UNUSUAL BRUISING OR BLEEDING °*URINARY PROBLEMS (pain or burning when urinating, or frequent urination) °*BOWEL PROBLEMS (unusual diarrhea, constipation, pain near the anus) °TENDERNESS IN MOUTH AND THROAT WITH OR WITHOUT PRESENCE OF ULCERS (sore throat, sores in mouth, or a toothache) °UNUSUAL RASH, SWELLING OR PAIN  °UNUSUAL VAGINAL DISCHARGE OR ITCHING  ° °Items with * indicate a potential emergency and should be followed up as soon as possible or go to the Emergency Department if any problems should occur. ° °Please show the CHEMOTHERAPY ALERT CARD or IMMUNOTHERAPY ALERT CARD at check-in to the  Emergency Department and triage nurse. ° °Should you have questions after your visit or need to cancel or reschedule your appointment, please contact [facility]  Dept: [phone]  and follow the prompts.  Office hours are 8:00 a.m. to 4:30 p.m. Monday - Friday. Please note that voicemails left after 4:00 p.m. may not be returned until the following business day.  We are closed weekends and major holidays. You have access to a nurse at all times for urgent questions. Please call the main number to the clinic Dept: [phone] and follow the prompts. ° ° °For any non-urgent questions, you may also contact your provider using MyChart. We now offer e-Visits for anyone 18 and older to request care online for non-urgent symptoms. For details visit [email or web address]. °  °Also download the MyChart app! Go to the app store, search "MyChart", open the app, select [facility], and log in with your MyChart username and password. ° °Due to Covid, a mask is required upon entering the hospital/clinic. If you do not have a mask, one will be given to you upon arrival. For doctor visits, patients may have 1 support person aged 18 or older with them. For treatment visits, patients cannot have anyone with them due to current Covid guidelines and our immunocompromised population.  ° °

## 2021-05-23 NOTE — Progress Notes (Signed)
Nutrition Follow-up: ? ?Patient with stage III colon cancer.  S/p ileostomy on 1/12.  Patient receiving folfox.  ? ?Met with patient during infusion.  Reports that appetite is good and eating well.  Says that he had trouble with blockage in ostomy.  Has been eating popcorn.  Aware that this may cause blockage. Says that he has education materials that RD provided on initial visit.  Denies nutrition impact symptoms at this time ? ? ? ?Medications: reviewed ? ?Labs: reviewed ? ?Anthropometrics:  ? ?Weight 236 lb 3.2 oz today ? ?243 lb 3.2 oz on 2/28 ?238 lb 9.6 oz on 2/1 ?255 lb on 1/12 ? ? ?NUTRITION DIAGNOSIS: Inadequate oral intake improved ? ? ?INTERVENTION:  ?Patient aware of foods that may cause blockage with ostomy.   ?Encouraged patient to continue eating to maintain weight during treatment. ?  ? ?MONITORING, EVALUATION, GOAL: weight trends, intake ? ? ?NEXT VISIT: Tuesday, March 28 during infusion ? ?Clarise Chacko B. Zenia Resides, RD, LDN ?Registered Dietitian ?[phone] (mobile) ? ? ?
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
## 2021-05-23 NOTE — Assessment & Plan Note (Addendum)
#   Stage III colon cancer T3N2-rectosigmoid; distal 5 mm margin; MSS. currently on adjuvant chemo FOLFOX q 2w x12.  ? ?#cProceed with FOLFOX cycle 3 today of planned #12 treatments. Labs today reviewed;  acceptable for treatment today. ANC 1.2; platelets-117.  Monitor ANC closely; if worse would consider Granix. ? ?# Solitary kidney- GFR- creat- 1.46 [GFR 58]; recent acute renal failure [JAN 2023-creatinine-5.3]; monitor closely. ? ?# Anemia- 12-13; JAN 2023- IDA-on Venofer.  ? ?# Loose stool in the colostomy bag-s/p colonic surgery. HOLD off reversal at this moment to avoid any unnecessary complications and break in adjuvant therapy.  However if this continues to be a significant lifestyle issue we will consider reversal.  Discussed with Dr.Pabon.  ? 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  ? ?# Hx of Afib-eliquis/amiodarone-monitor closely for bleeding tendencies on chemotherapy-STABLE.  ? ?# Elevated Blood glucose- PBF-174; non-diabetic; check BLood glucose TID; and again remained to bring a log. Might consider Metformin. Recommend low car diet.   ? ?# # IV access:port- Functional.  ? ?# DISPOSITION:  ?# cancel previous appt for 3/24.  ?# chemo today;  ?# in 1 week- labs- cbc/bmp; venofer ?# follow up in 2 weeks- MD; labs-cbc/cmp; FOLFOX; pump off D-3;Dr.B ? ? ? ?

## 2021-05-25 ENCOUNTER — Inpatient Hospital Stay: Payer: 59

## 2021-05-25 ENCOUNTER — Other Ambulatory Visit: Payer: Self-pay

## 2021-05-25 DIAGNOSIS — Z933 Colostomy status: Secondary | ICD-10-CM | POA: Diagnosis not present

## 2021-05-25 DIAGNOSIS — R200 Anesthesia of skin: Secondary | ICD-10-CM | POA: Diagnosis not present

## 2021-05-25 DIAGNOSIS — E860 Dehydration: Secondary | ICD-10-CM | POA: Diagnosis not present

## 2021-05-25 DIAGNOSIS — C187 Malignant neoplasm of sigmoid colon: Secondary | ICD-10-CM | POA: Diagnosis not present

## 2021-05-25 DIAGNOSIS — R5383 Other fatigue: Secondary | ICD-10-CM | POA: Diagnosis not present

## 2021-05-25 DIAGNOSIS — Z5189 Encounter for other specified aftercare: Secondary | ICD-10-CM | POA: Diagnosis not present

## 2021-05-25 DIAGNOSIS — K56609 Unspecified intestinal obstruction, unspecified as to partial versus complete obstruction: Secondary | ICD-10-CM | POA: Diagnosis not present

## 2021-05-25 DIAGNOSIS — Z5111 Encounter for antineoplastic chemotherapy: Secondary | ICD-10-CM | POA: Diagnosis not present

## 2021-05-25 DIAGNOSIS — N179 Acute kidney failure, unspecified: Secondary | ICD-10-CM | POA: Diagnosis not present

## 2021-05-25 MED ORDER — HEPARIN SOD (PORK) LOCK FLUSH 100 UNIT/ML IV SOLN
500.0000 [IU] | Freq: Once | INTRAVENOUS | Status: AC | PRN
  Administered 2021-05-25: 500 [IU]
  Filled 2021-05-25: qty 5

## 2021-05-25 MED ORDER — SODIUM CHLORIDE 0.9% FLUSH
10.0000 mL | INTRAVENOUS | Status: DC | PRN
  Administered 2021-05-25: 10 mL
  Filled 2021-05-25: qty 10

## 2021-05-25 NOTE — Progress Notes (Signed)
Schedule states pt to receive venofer today.  MD note from 3/14 states labs and venofer in one week.  Pt states he is not supposed to get venofer until next week. Per MD- "follow order" ? ? ?

## 2021-05-28 ENCOUNTER — Other Ambulatory Visit: Payer: Self-pay | Admitting: Internal Medicine

## 2021-05-29 ENCOUNTER — Other Ambulatory Visit: Payer: Self-pay | Admitting: *Deleted

## 2021-05-29 NOTE — Patient Outreach (Signed)
[facility]) Care Management Geriatric Nurse Practitioner Note   05/29/2021 Name:  Tracy Gardner MRN:  [id number] DOB:  1965-08-07  Summary: Pt continues chemotherapy with minimal side effects.  Recommendations/Changes made from today's visit: Continue to follow oncology and GI recommendations and attend appts.  Subjective: Tracy Gardner is an 56 y.o. year old male who is a primary patient of Tracy Corner, PA-C. The care management team was consulted for assistance with care management and/or care coordination needs.    Geriatric Nurse Practitioner completed Telephone Visit today.   Objective:  Medications Reviewed Today     Reviewed by Tracy Lovely, NP (Nurse Practitioner) on 05/29/21 at 1052  Med List Status: <None>   Medication Order Taking? Sig Documenting Provider Last Dose Status Informant  albuterol (VENTOLIN HFA) 108 (90 Base) MCG/ACT inhaler [id number] No Inhale 2 puffs into the lungs 4 times a day as needed for shortness of breath, wheezing and cough  Taking Active   amiodarone (PACERONE) 200 MG tablet [id number] No Take 1 tablet (200 mg total) by mouth daily. Hold for HR less then 50 Tracy Gardner Taking Active Spouse/Significant Other  apixaban (ELIQUIS) 5 MG TABS tablet [id number] No Take 1 tablet (5 mg total) by mouth 2 (two) times daily. Tracy Iba, Gardner Taking Active   blood glucose meter kit and supplies KIT [id number] No #Check blood glucose fasting/in the morning; after lunch; and after dinner. Tracy Coder, Gardner Taking Active   Blood Pressure Monitoring (OMRON 3 SERIES BP MONITOR) DEVI [id number] No Use as directed Tracy Coder, Gardner Taking Active   cetirizine (ZYRTEC) 10 MG tablet [id number] No Take 10 mg by mouth daily. Provider, Historical, Gardner Taking Active Spouse/Significant Other  doxylamine, Sleep, (UNISOM) 25 MG tablet [id number] No Take 25 mg by mouth at bedtime. Provider, Historical, Gardner Taking Active  Spouse/Significant Other  ELIQUIS 5 MG TABS tablet [id number] No Take 1 tablet (5 mg total) by mouth every 12 (twelve) hours  Taking Active   empagliflozin (JARDIANCE) 10 MG TABS tablet [id number] No Take 1 tablet (10 mg total) by mouth daily.  Taking Active Spouse/Significant Other           Med Note Tracy Gardner   Mon May 01, 2021  2:16 PM)    fluticasone (FLOVENT HFA) 110 MCG/ACT inhaler [id number] No Inhale into the lungs 2 (two) times daily.  Taking Active Spouse/Significant Other  glucose blood (FREESTYLE LITE) test strip [id number] No Use as directed to test 3 times daily  Taking Active   Lancets (FREESTYLE) lancets [id number] No #Check blood glucose fasting/in the morning; after lunch; and after dinner. Tracy Coder, Gardner Taking Active   lidocaine-prilocaine (EMLA) cream [id number] No Apply to affected area once Tracy Coder, Gardner Taking Active   loperamide (IMODIUM A-D) 2 MG tablet [id number] No Take 1 tablet (2 mg total) by mouth 4 (four) times daily as needed for diarrhea or loose stools. Tracy Gardner Taking Active   Multiple Vitamins-Minerals (MULTIVITAMIN WITH MINERALS) tablet [id number] No Take 1 tablet by mouth daily. Provider, Historical, Gardner Taking Active Spouse/Significant Other  ondansetron (ZOFRAN) 4 MG tablet [id number] No Take 1 tablet (4 mg total) by mouth every 8 (eight) hours as needed for nausea or vomiting. Pabon, [place], Gardner Taking Active   prochlorperazine (COMPAZINE) 10 MG tablet [id number] No Take 1 tablet (10 mg total) by mouth every 6 (six) hours as needed for nausea or vomiting. Tracy Coder, Gardner  Taking Active   rosuvastatin (CRESTOR) 10 MG tablet [id number] No Take 1 tablet (10 mg total) by mouth daily.  Patient taking differently: Take 10 mg by mouth every evening.   Tracy Iba, Gardner Taking Active Spouse/Significant Other             SDOH:  (Social Determinants of Health) assessments and interventions performed:    Care  Plan  Review of patient past medical history, allergies, medications, health status, including review of consultants reports, laboratory and other test data, was performed as part of comprehensive evaluation for care management services.   Care Plan : [facility] Care Management Plan of Care  Updates made by Tracy Lovely, NP since 05/29/2021 12:00 AM     Problem: Adenocarcinoma of colon   Priority: High  Onset Date: 03/29/2021     Long-Range Goal: Patient will follow oncology plan of care as reported by pt over the next 60 days.   Start Date: 04/28/2021  Expected End Date: 07/07/2021  This Visit's Progress: On track  Priority: High  Note:    Update 05/29/21:  (Status: Goal on Track (progressing): YES.) Long Term Goal  Evaluation of current treatment plan related to Cancer treatment and patient's adherence to plan as established by provider       [name] is tolerating his chemo well. He does report that he does have some side effects: feeling very cold, then hot, tingling in        his hands, feet and mouth when he drinks something very cold. He is very tired for 2-3 days after the chemo. He has not missed             any appts. Encouraged early call to Gardner if any troublesome problem occurs.  Current Barriers:  Knowledge Deficits related to plan of care for management of Adenocarcinoma of colon.   RNCM Clinical Goal(s):  Patient will verbalize understanding of plan for management of cancer as evidenced by sharing of information with NP each month. attend all scheduled medical appointments: Oncology as evidenced by pt report and chart review.        work with Oncology to progress through their plan of care as evidenced by pt report and chart review.  through collaboration with RN Care manager, provider, and care team.   Interventions: Evaluation of current treatment plan related to  self management and patient's adherence to plan as established by provider       Patient has received one  chemotherapy treatment yesterday. He tolerated the procedure well and is not having any immediate side         effects. He has his schedule for upcoming labs, appts and infusions. His wife is very supportive and available to him.   Patient Goals/Self-Care Activities: Attend all scheduled provider appointments Call provider office for new concerns or questions       Long-Range Goal: Patient will attend GI consults for preparation and plan to undergo reanastomosis of his bowel over the next 3 months as evidenced by pt report and chart review..   Start Date: 04/28/2021  Expected End Date: 08/09/2021  Priority: Medium  Note:    Update 05/29/21:  (Status: Goal on Track (progressing): YES.) Long Term Goal  Evaluation of current treatment plan related to Follow Up Surgical Plan  and patient's adherence to plan as established by provider       Pt had appt with his GI surgeon and was evaluated for reanastomosis of his ileostomy. Gardner wants  pt blood counts to be improved        before doing the surgery. They will see each other again in one month for re-evaluation. Pt is caring for his ostomy and is not        having any problems.   Current Barriers:  Knowledge Deficits related to plan of care for management of Adenocarcinoma of colon.   RNCM Clinical Goal(s):  Patient will Knowledge Deficits related to plan of care for management of Adenocarcinoma of colon, through collaboration with RN Care          manager, provider, and care team.   Interventions: Monitor Inter-disciplinary care team collaboration (see longitudinal plan of care) Evaluation of current treatment plan related to  self management and patient's adherence to plan as established by provider       Patient reports he will be having an upcoming appt with GI to evaluate if they can proceed with a plan to perform reanastomosis of his        bowel. He is able to care for his ileostomy by himself and with the aid of his wife. No  complications since our last conversation. Has        received his supplies from [employer].  Patient Goals/Self-Care Activities: Attend all scheduled provider appointments Perform all self care activities independently  Call provider office for new concerns or questions         Plan: Telephone follow up appointment with care management team member scheduled for:  April 25th. Patient agrees to the plan of care.  Zara Council. Burgess Estelle, MSN, [facility] Gerontological Nurse Practitioner [facility] Care Management [phone]

## 2021-05-30 ENCOUNTER — Inpatient Hospital Stay: Payer: 59

## 2021-05-30 ENCOUNTER — Other Ambulatory Visit: Payer: Self-pay

## 2021-05-30 ENCOUNTER — Other Ambulatory Visit (HOSPITAL_BASED_OUTPATIENT_CLINIC_OR_DEPARTMENT_OTHER): Payer: Self-pay

## 2021-05-30 ENCOUNTER — Other Ambulatory Visit: Payer: Self-pay | Admitting: Nurse Practitioner

## 2021-05-30 VITALS — BP 107/61 | Temp 98.1°F | Resp 61

## 2021-05-30 DIAGNOSIS — C187 Malignant neoplasm of sigmoid colon: Secondary | ICD-10-CM

## 2021-05-30 DIAGNOSIS — N179 Acute kidney failure, unspecified: Secondary | ICD-10-CM | POA: Diagnosis not present

## 2021-05-30 DIAGNOSIS — R200 Anesthesia of skin: Secondary | ICD-10-CM | POA: Diagnosis not present

## 2021-05-30 DIAGNOSIS — D500 Iron deficiency anemia secondary to blood loss (chronic): Secondary | ICD-10-CM

## 2021-05-30 DIAGNOSIS — Z5189 Encounter for other specified aftercare: Secondary | ICD-10-CM | POA: Diagnosis not present

## 2021-05-30 DIAGNOSIS — Z933 Colostomy status: Secondary | ICD-10-CM | POA: Diagnosis not present

## 2021-05-30 DIAGNOSIS — E860 Dehydration: Secondary | ICD-10-CM | POA: Diagnosis not present

## 2021-05-30 DIAGNOSIS — Z5111 Encounter for antineoplastic chemotherapy: Secondary | ICD-10-CM | POA: Diagnosis not present

## 2021-05-30 DIAGNOSIS — K56609 Unspecified intestinal obstruction, unspecified as to partial versus complete obstruction: Secondary | ICD-10-CM | POA: Diagnosis not present

## 2021-05-30 DIAGNOSIS — D751 Secondary polycythemia: Secondary | ICD-10-CM

## 2021-05-30 DIAGNOSIS — R5383 Other fatigue: Secondary | ICD-10-CM | POA: Diagnosis not present

## 2021-05-30 LAB — CBC WITH DIFFERENTIAL/PLATELET
Abs Immature Granulocytes: 0.03 10*3/uL (ref 0.00–0.07)
Basophils Absolute: 0 10*3/uL (ref 0.0–0.1)
Basophils Relative: 2 %
Eosinophils Absolute: 0.1 10*3/uL (ref 0.0–0.5)
Eosinophils Relative: 3 %
HCT: 41.9 % (ref 39.0–52.0)
Hemoglobin: 13.8 g/dL (ref 13.0–17.0)
Immature Granulocytes: 1 %
Lymphocytes Relative: 29 %
Lymphs Abs: 0.7 10*3/uL (ref 0.7–4.0)
MCH: 26.1 pg (ref 26.0–34.0)
MCHC: 32.9 g/dL (ref 30.0–36.0)
MCV: 79.2 fL — ABNORMAL LOW (ref 80.0–100.0)
Monocytes Absolute: 0.3 10*3/uL (ref 0.1–1.0)
Monocytes Relative: 12 %
Neutro Abs: 1.3 10*3/uL — ABNORMAL LOW (ref 1.7–7.7)
Neutrophils Relative %: 53 %
Platelets: 121 10*3/uL — ABNORMAL LOW (ref 150–400)
RBC: 5.29 MIL/uL (ref 4.22–5.81)
RDW: 23.5 % — ABNORMAL HIGH (ref 11.5–15.5)
WBC: 2.4 10*3/uL — ABNORMAL LOW (ref 4.0–10.5)
nRBC: 0 % (ref 0.0–0.2)

## 2021-05-30 LAB — BASIC METABOLIC PANEL
Anion gap: 9 (ref 5–15)
BUN: 10 mg/dL (ref 6–20)
CO2: 19 mmol/L — ABNORMAL LOW (ref 22–32)
Calcium: 8.8 mg/dL — ABNORMAL LOW (ref 8.9–10.3)
Chloride: 105 mmol/L (ref 98–111)
Creatinine, Ser: 1.3 mg/dL — ABNORMAL HIGH (ref 0.61–1.24)
GFR, Estimated: >60 mL/min (ref >60)
Glucose, Bld: 185 mg/dL — ABNORMAL HIGH (ref 70–99)
Potassium: 3.6 mmol/L (ref 3.5–5.1)
Sodium: 133 mmol/L — ABNORMAL LOW (ref 135–145)

## 2021-05-30 MED ORDER — HEPARIN SOD (PORK) LOCK FLUSH 100 UNIT/ML IV SOLN
500.0000 [IU] | Freq: Once | INTRAVENOUS | Status: AC
  Administered 2021-05-30: 500 [IU]
  Filled 2021-05-30: qty 5

## 2021-05-30 MED ORDER — IRON SUCROSE 20 MG/ML IV SOLN
200.0000 mg | Freq: Once | INTRAVENOUS | Status: DC
  Administered 2021-05-30: 200 mg via INTRAVENOUS

## 2021-05-30 MED ORDER — AMOXICILLIN-POT CLAVULANATE 875-125 MG PO TABS
1.0000 | ORAL_TABLET | Freq: Two times a day (BID) | ORAL | 0 refills | Status: DC
  Filled 2021-05-30: qty 14, 7d supply, fill #0

## 2021-05-30 MED ORDER — SODIUM CHLORIDE 0.9 % IV SOLN: 200.0000 mg | Freq: Once | INTRAVENOUS | Status: DC

## 2021-05-30 MED ORDER — SODIUM CHLORIDE 0.9 % IV SOLN
Freq: Once | INTRAVENOUS | Status: DC
  Filled 2021-05-30: qty 250

## 2021-05-30 NOTE — Progress Notes (Signed)
Patient reporting URI like symptoms. ANC reduced and patient on chemotherapy. Recommend augmentin BID x 7 days. Prescription sent.  ?

## 2021-05-30 NOTE — Progress Notes (Signed)
Pt here for labs / venofer.  Pt states last few weeks he has had sinus / allergy issues ... running nose / congestion , clear pnd.  He stated within last 1-2 days - congestion has worsen, has headache, drainage is greenish- brown color .  states he took afrin yesterday with some relief.  states afebrile.   he is requesting meds...per Beckey Rutter NP-  she sent rx for Augmentin. Informed pt . Educated on potential GI side effects. Pt states he is aware and has taken before.  ?

## 2021-06-02 ENCOUNTER — Other Ambulatory Visit: Payer: 59

## 2021-06-02 ENCOUNTER — Ambulatory Visit: Payer: 59 | Admitting: Internal Medicine

## 2021-06-02 NOTE — Telephone Encounter (Signed)
Note:

## 2021-06-06 ENCOUNTER — Inpatient Hospital Stay: Payer: 59

## 2021-06-06 ENCOUNTER — Inpatient Hospital Stay (HOSPITAL_BASED_OUTPATIENT_CLINIC_OR_DEPARTMENT_OTHER): Payer: 59 | Admitting: Nurse Practitioner

## 2021-06-06 ENCOUNTER — Other Ambulatory Visit: Payer: Self-pay

## 2021-06-06 ENCOUNTER — Encounter: Payer: Self-pay | Admitting: Nurse Practitioner

## 2021-06-06 VITALS — BP 135/95 | HR 70 | Temp 96.7°F | Ht 71.0 in | Wt 238.8 lb

## 2021-06-06 DIAGNOSIS — D6959 Other secondary thrombocytopenia: Secondary | ICD-10-CM

## 2021-06-06 DIAGNOSIS — Z5189 Encounter for other specified aftercare: Secondary | ICD-10-CM | POA: Diagnosis not present

## 2021-06-06 DIAGNOSIS — C187 Malignant neoplasm of sigmoid colon: Secondary | ICD-10-CM

## 2021-06-06 DIAGNOSIS — R5383 Other fatigue: Secondary | ICD-10-CM | POA: Diagnosis not present

## 2021-06-06 DIAGNOSIS — K56609 Unspecified intestinal obstruction, unspecified as to partial versus complete obstruction: Secondary | ICD-10-CM | POA: Diagnosis not present

## 2021-06-06 DIAGNOSIS — E860 Dehydration: Secondary | ICD-10-CM | POA: Diagnosis not present

## 2021-06-06 DIAGNOSIS — D701 Agranulocytosis secondary to cancer chemotherapy: Secondary | ICD-10-CM | POA: Diagnosis not present

## 2021-06-06 DIAGNOSIS — C189 Malignant neoplasm of colon, unspecified: Secondary | ICD-10-CM | POA: Diagnosis not present

## 2021-06-06 DIAGNOSIS — R7309 Other abnormal glucose: Secondary | ICD-10-CM

## 2021-06-06 DIAGNOSIS — T451X5A Adverse effect of antineoplastic and immunosuppressive drugs, initial encounter: Secondary | ICD-10-CM

## 2021-06-06 DIAGNOSIS — Z5111 Encounter for antineoplastic chemotherapy: Secondary | ICD-10-CM

## 2021-06-06 DIAGNOSIS — R200 Anesthesia of skin: Secondary | ICD-10-CM | POA: Diagnosis not present

## 2021-06-06 DIAGNOSIS — Z933 Colostomy status: Secondary | ICD-10-CM | POA: Diagnosis not present

## 2021-06-06 DIAGNOSIS — N179 Acute kidney failure, unspecified: Secondary | ICD-10-CM | POA: Diagnosis not present

## 2021-06-06 LAB — COMPREHENSIVE METABOLIC PANEL
ALT: 36 U/L (ref 0–44)
AST: 35 U/L (ref 15–41)
Albumin: 4 g/dL (ref 3.5–5.0)
Alkaline Phosphatase: 65 U/L (ref 38–126)
Anion gap: 6 (ref 5–15)
BUN: 14 mg/dL (ref 6–20)
CO2: 23 mmol/L (ref 22–32)
Calcium: 8.9 mg/dL (ref 8.9–10.3)
Chloride: 104 mmol/L (ref 98–111)
Creatinine, Ser: 1.14 mg/dL (ref 0.61–1.24)
GFR, Estimated: >60 mL/min (ref >60)
Glucose, Bld: 165 mg/dL — ABNORMAL HIGH (ref 70–99)
Potassium: 3.7 mmol/L (ref 3.5–5.1)
Sodium: 133 mmol/L — ABNORMAL LOW (ref 135–145)
Total Bilirubin: 0.6 mg/dL (ref 0.3–1.2)
Total Protein: 7.6 g/dL (ref 6.5–8.1)

## 2021-06-06 LAB — CBC WITH DIFFERENTIAL/PLATELET
Abs Immature Granulocytes: 0.01 10*3/uL (ref 0.00–0.07)
Basophils Absolute: 0.1 10*3/uL (ref 0.0–0.1)
Basophils Relative: 2 %
Eosinophils Absolute: 0.1 10*3/uL (ref 0.0–0.5)
Eosinophils Relative: 3 %
HCT: 45.1 % (ref 39.0–52.0)
Hemoglobin: 15.1 g/dL (ref 13.0–17.0)
Immature Granulocytes: 0 %
Lymphocytes Relative: 32 %
Lymphs Abs: 0.8 10*3/uL (ref 0.7–4.0)
MCH: 27 pg (ref 26.0–34.0)
MCHC: 33.5 g/dL (ref 30.0–36.0)
MCV: 80.7 fL (ref 80.0–100.0)
Monocytes Absolute: 0.4 10*3/uL (ref 0.1–1.0)
Monocytes Relative: 16 %
Neutro Abs: 1.1 10*3/uL — ABNORMAL LOW (ref 1.7–7.7)
Neutrophils Relative %: 47 %
Platelets: 92 10*3/uL — ABNORMAL LOW (ref 150–400)
RBC: 5.59 MIL/uL (ref 4.22–5.81)
RDW: 24.8 % — ABNORMAL HIGH (ref 11.5–15.5)
WBC: 2.4 10*3/uL — ABNORMAL LOW (ref 4.0–10.5)
nRBC: 0 % (ref 0.0–0.2)

## 2021-06-06 MED ORDER — SODIUM CHLORIDE 0.9 % IV SOLN
2400.0000 mg/m2 | INTRAVENOUS | Status: DC
  Administered 2021-06-06: 5600 mg via INTRAVENOUS
  Filled 2021-06-06: qty 112

## 2021-06-06 MED ORDER — PALONOSETRON HCL INJECTION 0.25 MG/5ML
0.2500 mg | Freq: Once | INTRAVENOUS | Status: AC
  Administered 2021-06-06: 0.25 mg via INTRAVENOUS
  Filled 2021-06-06: qty 5

## 2021-06-06 MED ORDER — SODIUM CHLORIDE 0.9 % IV SOLN
10.0000 mg | Freq: Once | INTRAVENOUS | Status: AC
  Administered 2021-06-06: 10 mg via INTRAVENOUS
  Filled 2021-06-06: qty 10

## 2021-06-06 MED ORDER — DEXTROSE 5 % IV SOLN
Freq: Once | INTRAVENOUS | Status: AC
  Filled 2021-06-06: qty 250

## 2021-06-06 MED ORDER — OXALIPLATIN CHEMO INJECTION 100 MG/20ML
85.0000 mg/m2 | Freq: Once | INTRAVENOUS | Status: AC
  Administered 2021-06-06: 200 mg via INTRAVENOUS
  Filled 2021-06-06: qty 40

## 2021-06-06 MED ORDER — HEPARIN SOD (PORK) LOCK FLUSH 100 UNIT/ML IV SOLN
INTRAVENOUS | Status: AC
  Filled 2021-06-06: qty 5

## 2021-06-06 MED ORDER — LEUCOVORIN CALCIUM INJECTION 350 MG
408.0000 mg/m2 | Freq: Once | INTRAVENOUS | Status: AC
  Administered 2021-06-06: 950 mg via INTRAVENOUS
  Filled 2021-06-06: qty 47.5
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
## 2021-06-06 NOTE — Patient Instructions (Signed)
[facility] CANCER CTR AT [place]  Discharge Instructions: ?Thank you for choosing [facility] to provide your oncology and hematology care.  ?If you have a lab appointment with the [facility], please go directly to the [facility] and check in at the registration area. ? ?Wear comfortable clothing and clothing appropriate for easy access to any Portacath or PICC line.  ? ?We strive to give you quality time with your provider. You may need to reschedule your appointment if you arrive late (15 or more minutes).  Arriving late affects you and other patients whose appointments are after yours.  Also, if you miss three or more appointments without notifying the office, you may be dismissed from the clinic at the provider?s discretion.    ?  ?For prescription refill requests, have your pharmacy contact our office and allow 72 hours for refills to be completed.   ? ?Today you received the following chemotherapy and/or immunotherapy agents Oxaliplatin, Leucovorin and Adrucil     ?  ?To help prevent nausea and vomiting after your treatment, we encourage you to take your nausea medication as directed. ? ?BELOW ARE SYMPTOMS THAT SHOULD BE REPORTED IMMEDIATELY: ?*FEVER GREATER THAN 100.4 F (38 ?C) OR HIGHER ?*CHILLS OR SWEATING ?*NAUSEA AND VOMITING THAT IS NOT CONTROLLED WITH YOUR NAUSEA MEDICATION ?*UNUSUAL SHORTNESS OF BREATH ?*UNUSUAL BRUISING OR BLEEDING ?*URINARY PROBLEMS (pain or burning when urinating, or frequent urination) ?*BOWEL PROBLEMS (unusual diarrhea, constipation, pain near the anus) ?TENDERNESS IN MOUTH AND THROAT WITH OR WITHOUT PRESENCE OF ULCERS (sore throat, sores in mouth, or a toothache) ?UNUSUAL RASH, SWELLING OR PAIN  ?UNUSUAL VAGINAL DISCHARGE OR ITCHING  ? ?Items with * indicate a potential emergency and should be followed up as soon as possible or go to the Emergency Department if any problems should occur. ? ?Please show the CHEMOTHERAPY ALERT CARD or IMMUNOTHERAPY  ALERT CARD at check-in to the Emergency Department and triage nurse. ? ?Should you have questions after your visit or need to cancel or reschedule your appointment, please contact [facility] CANCER [place] AT [place]  [phone] and follow the prompts.  Office hours are 8:00 a.m. to 4:30 p.m. Monday - Friday. Please note that voicemails left after 4:00 p.m. may not be returned until the following business day.  We are closed weekends and major holidays. You have access to a nurse at all times for urgent questions. Please call the main number to the clinic [phone] and follow the prompts. ? ?For any non-urgent questions, you may also contact your provider using MyChart. We now offer e-Visits for anyone 18 and older to request care online for non-urgent symptoms. For details visit [email or web address]. ?  ?Also download the MyChart app! Go to the app store, search "MyChart", open the app, select [facility], and log in with your MyChart username and password. ? ?Due to Covid, a mask is required upon entering the hospital/clinic. If you do not have a mask, one will be given to you upon arrival. For doctor visits, patients may have 1 support person aged 29 or older with them. For treatment visits, patients cannot have anyone with them due to current Covid guidelines and our immunocompromised population.  ?

## 2021-06-06 NOTE — Progress Notes (Signed)
Nutrition Follow-up: ? ? ?Patient with stage III colon cancer.  S/p ileostomy on 1/12.  Patient receiving folfox. ? ?Met with patient during infusion.  Patient reports good appetite. Denies any nutrition impact symptoms or concerns about nutrition.   ? ? ? ?Medications: reviewed ? ?Labs: reviewed ? ?Anthropometrics:  ? ?Weight 238 lb 12.8 oz ? ?243 lb 3.2 oz on 2/28 ?238 lb 9.6 oz on 2/1 ?255 lb on 1/12 ? ? ?NUTRITION DIAGNOSIS: Inadequate oral intake improved ? ? ? ?INTERVENTION:  ?Continue to good oral intake to maintain weight during treatment.  ?Patient denies questions or concerns at this time.  ?RD available as needed ? ? ? ?NEXT VISIT: as needed ? ?Gaia Gullikson B. Zenia Resides, RD, LDN ?Registered Dietitian ?[phone] ? ? ?

## 2021-06-08 ENCOUNTER — Inpatient Hospital Stay: Payer: 59

## 2021-06-08 ENCOUNTER — Encounter: Payer: Self-pay | Admitting: Cardiovascular Disease

## 2021-06-08 ENCOUNTER — Ambulatory Visit: Payer: 59 | Admitting: Cardiovascular Disease

## 2021-06-08 ENCOUNTER — Other Ambulatory Visit: Payer: Self-pay

## 2021-06-08 VITALS — BP 118/72 | HR 62 | Temp 96.9°F | Resp 16

## 2021-06-08 VITALS — BP 106/60 | HR 57 | Ht 71.0 in | Wt 239.1 lb

## 2021-06-08 DIAGNOSIS — I480 Paroxysmal atrial fibrillation: Secondary | ICD-10-CM | POA: Diagnosis not present

## 2021-06-08 DIAGNOSIS — Z933 Colostomy status: Secondary | ICD-10-CM | POA: Diagnosis not present

## 2021-06-08 DIAGNOSIS — D751 Secondary polycythemia: Secondary | ICD-10-CM

## 2021-06-08 DIAGNOSIS — C187 Malignant neoplasm of sigmoid colon: Secondary | ICD-10-CM

## 2021-06-08 DIAGNOSIS — Z5111 Encounter for antineoplastic chemotherapy: Secondary | ICD-10-CM | POA: Diagnosis not present

## 2021-06-08 DIAGNOSIS — I5021 Acute systolic (congestive) heart failure: Secondary | ICD-10-CM | POA: Diagnosis not present

## 2021-06-08 DIAGNOSIS — R200 Anesthesia of skin: Secondary | ICD-10-CM | POA: Diagnosis not present

## 2021-06-08 DIAGNOSIS — N179 Acute kidney failure, unspecified: Secondary | ICD-10-CM | POA: Diagnosis not present

## 2021-06-08 DIAGNOSIS — E860 Dehydration: Secondary | ICD-10-CM | POA: Diagnosis not present

## 2021-06-08 DIAGNOSIS — R5383 Other fatigue: Secondary | ICD-10-CM | POA: Diagnosis not present

## 2021-06-08 DIAGNOSIS — Z5189 Encounter for other specified aftercare: Secondary | ICD-10-CM | POA: Diagnosis not present

## 2021-06-08 DIAGNOSIS — I420 Dilated cardiomyopathy: Secondary | ICD-10-CM

## 2021-06-08 DIAGNOSIS — K56609 Unspecified intestinal obstruction, unspecified as to partial versus complete obstruction: Secondary | ICD-10-CM | POA: Diagnosis not present

## 2021-06-08 MED ORDER — AMIODARONE HCL 200 MG PO TABS
200.0000 mg | ORAL_TABLET | Freq: Every day | ORAL | 3 refills | Status: DC
  Filled 2021-06-08 – 2021-08-03 (×2): qty 90, 90d supply, fill #0
  Filled 2021-11-01 – 2021-11-22 (×2): qty 90, 90d supply, fill #1
  Filled 2022-02-21: qty 90, 90d supply, fill #2

## 2021-06-08 MED ORDER — ROSUVASTATIN CALCIUM 10 MG PO TABS
10.0000 mg | ORAL_TABLET | Freq: Every evening | ORAL | 3 refills | Status: DC
  Filled 2021-06-08 – 2021-08-21 (×2): qty 90, 90d supply, fill #0
  Filled 2021-11-22: qty 90, 90d supply, fill #1

## 2021-06-08 MED ORDER — HEPARIN SOD (PORK) LOCK FLUSH 100 UNIT/ML IV SOLN
500.0000 [IU] | Freq: Once | INTRAVENOUS | Status: AC | PRN
  Administered 2021-06-08: 500 [IU]
  Filled 2021-06-08: qty 5

## 2021-06-08 MED ORDER — APIXABAN 5 MG PO TABS
ORAL_TABLET | ORAL | 3 refills | Status: DC
  Filled 2021-06-08: qty 180, fill #0

## 2021-06-08 MED ORDER — SODIUM CHLORIDE 0.9% FLUSH
10.0000 mL | INTRAVENOUS | Status: DC | PRN
  Administered 2021-06-08: 10 mL
  Filled 2021-06-08: qty 10

## 2021-06-08 MED ORDER — PEGFILGRASTIM-BMEZ 6 MG/0.6ML ~~LOC~~ SOSY
6.0000 mg | PREFILLED_SYRINGE | Freq: Once | SUBCUTANEOUS | Status: AC
  Administered 2021-06-08: 6 mg via SUBCUTANEOUS

## 2021-06-08 NOTE — Progress Notes (Signed)
Cardiology Office Note ? ?Date:  06/09/2021  ? ?ID:  Tracy Gardner, DOB [birth year], MRN [id number] ? ?PCP:  [name], PA-C  ? ?Chief Complaint  ?Patient presents with  ? 6 month follow up   ?  "Doing well." Medications reviewed by the patient verbally.   ? ? ?HPI:  ?Tracy Gardner is a 56 year old gentleman with past medical history of  ?smoking,  ?diabetes type 2 ?polycythemia,  ?hypertension,  ?hyperlipidemia,  ?borderline diabetes  ?presenting to the hospital with with acute respiratory distress, atrial fibrillation with RVR   ?Colon cancer, surgery ?OSA on CPAP ?Underwent TEE cardioversion July 07, 2020, EF 30 to 35% in A-fib ?Normalized ejection fraction July 2022 ?presents for routine follow-up of his atrial fibrillation ? ?LOV  with myself 7/22 ? ?In follow-up, we discussed recent events, diagnosis of colon cancer and treatment ?Was having diarrhea symptoms, ?12/22: colonscopy: mass noted, Stage III colon cancer ?Surgery for resection 1/23 ?Complications of small obstruction resolved /treated conservatively.   ?FEB 14th, 2023- FOLFOX ?Has an ostomy ? ?Reports he is doing relatively well, ?Blood pressure low, "Lost 35 pounds" ?On chemo ? ?Denies tachyarrhythmia ?No regular exercise program ? ?EKG personally reviewed by myself on todays visit ?Normal sinus rhythm rate 57 bpm no significant ST-T wave changes ? ?Past medical history reviewed ?Admitted with bronchopneumonia April 2022, started on antibiotics, nebulizers ?History of smoking ?Atrial fibrillation with RVR ?Cardiomyopathy, ejection fraction 35 to 40% , felt secondary to cardiac arrhythmia though unable to exclude ischemia ? ?Echocardiogram July 07, 2020 prior to cardioversion ? 1. Left ventricular ejection fraction, by estimation, is 35 to 40%. The  ?left ventricle has moderately decreased function. The left ventricle  ?demonstrates global hypokinesis. The left ventricular internal cavity size  ?was mildly dilated. Left ventricular   ?diastolic parameters are indeterminate.  ? 2. Right ventricular systolic function is normal. The right ventricular  ?size is normal. There is normal pulmonary artery systolic pressure.  ? 3. Left atrial size was mildly dilated.  ? ?Underwent TEE cardioversion July 07, 2020 ? ?PMH:   has a past medical history of Anemia, Asthma, persistent not controlled, Cancer ([place]), Cardiomyopathy ([place]), CKD (chronic kidney disease), stage III ([place]), Difficult intubation, Dysrhythmia, Essential hypertension, History of kidney stones, History of nephrectomy, unilateral, Hyperlipidemia, Morbid obesity ([place]), Persistent atrial fibrillation ([place]), Pneumonia, Polycythemia, Sleep apnea, Tobacco abuse, and Type II diabetes mellitus ([place]). ? ?PSH:    ?Past Surgical History:  ?Procedure Laterality Date  ? CARDIOVERSION N/A 07/08/2020  ? Procedure: CARDIOVERSION;  Surgeon: Minna Merritts, MD;  Location: [facility] ORS;  Service: Cardiovascular;  Laterality: N/A;  ? CARDIOVERSION N/A 08/05/2020  ? Procedure: CARDIOVERSION;  Surgeon: Minna Merritts, MD;  Location: [facility] ORS;  Service: Cardiovascular;  Laterality: N/A;  ? COLON RESECTION  03/23/2021  ? COLONOSCOPY N/A 03/09/2021  ? Procedure: COLONOSCOPY;  Surgeon: Virgel Manifold, MD;  Location: [facility] ENDOSCOPY;  Service: Endoscopy;  Laterality: N/A;  ? ESOPHAGOGASTRODUODENOSCOPY N/A 03/09/2021  ? Procedure: ESOPHAGOGASTRODUODENOSCOPY (EGD);  Surgeon: Virgel Manifold, MD;  Location: [facility] ENDOSCOPY;  Service: Endoscopy;  Laterality: N/A;  ? NEPHRECTOMY Right   ? PORTACATH PLACEMENT N/A 04/13/2021  ? Procedure: INSERTION PORT-A-CATH;  Surgeon: Jules Husbands, MD;  Location: [facility] ORS;  Service: General;  Laterality: N/A;  Provider requesting 1 hour / 60 minutes for procedure.  ? TEE WITHOUT CARDIOVERSION N/A 07/08/2020  ? Procedure: TRANSESOPHAGEAL ECHOCARDIOGRAM (TEE);  Surgeon: Minna Merritts, MD;  Location: [facility] ORS;  Service: Cardiovascular;  Laterality: N/A;  ? TONSILLECTOMY     ? ? ?Current Outpatient Medications  ?Medication Sig Dispense Refill  ? albuterol (VENTOLIN HFA) 108 (90 Base) MCG/ACT inhaler Inhale 2 puffs into the lungs 4 times a day as needed for shortness of breath, wheezing and cough 18 g 11  ? blood glucose meter kit and supplies KIT #Check blood glucose fasting/in the morning; after lunch; and after dinner. 1 each 0  ? Blood Pressure Monitoring (OMRON 3 SERIES BP MONITOR) DEVI Use as directed 1 each 0  ? cetirizine (ZYRTEC) 10 MG tablet Take 10 mg by mouth daily.    ? doxylamine, Sleep, (UNISOM) 25 MG tablet Take 25 mg by mouth at bedtime.    ? empagliflozin (JARDIANCE) 10 MG TABS tablet Take 1 tablet (10 mg total) by mouth daily. 30 tablet 11  ? fluticasone (FLOVENT HFA) 110 MCG/ACT inhaler Inhale into the lungs 2 (two) times daily. 12 g 12  ? glucose blood (FREESTYLE LITE) test strip Use as directed to test 3 times daily 100 each 0  ? Lancets (FREESTYLE) lancets #Check blood glucose fasting/in the morning; after lunch; and after dinner. 100 each 12  ? lidocaine-prilocaine (EMLA) cream Apply to affected area once 30 g 3  ? loperamide (IMODIUM A-D) 2 MG tablet Take 1 tablet (2 mg total) by mouth 4 (four) times daily as needed for diarrhea or loose stools. 30 tablet 0  ? Multiple Vitamins-Minerals (MULTIVITAMIN WITH MINERALS) tablet Take 1 tablet by mouth daily.    ? ondansetron (ZOFRAN) 4 MG tablet Take 1 tablet (4 mg total) by mouth every 8 (eight) hours as needed for nausea or vomiting. 20 tablet 0  ? prochlorperazine (COMPAZINE) 10 MG tablet Take 1 tablet (10 mg total) by mouth every 6 (six) hours as needed for nausea or vomiting. 40 tablet 1  ? amiodarone (PACERONE) 200 MG tablet Take 1 tablet (200 mg total) by mouth daily. Hold for HR less then 50 90 tablet 3  ? apixaban (ELIQUIS) 5 MG TABS tablet Take 1 tablet (5 mg total) by mouth 2 (two) times daily. 180 tablet 3  ? rosuvastatin (CRESTOR) 10 MG tablet Take 1 tablet (10 mg total) by mouth every evening. 90 tablet 3   ? ?No current facility-administered medications for this visit.  ? ? ?Allergies:   Patient has no known allergies.  ? ?Social History:  The patient  reports that he quit smoking about a year ago. His smoking use included cigarettes. He has a 40.00 pack-year smoking history. He has been exposed to tobacco smoke. He has never used smokeless tobacco. He reports that he does not currently use alcohol. He reports that he does not use drugs.  ? ?Family History:   family history includes Atrial fibrillation in his mother; CAD in his mother; Diabetes in his father.  ? ?Review of Systems: ?Review of Systems  ?Constitutional: Negative.   ?HENT: Negative.    ?Respiratory: Negative.    ?Cardiovascular: Negative.   ?Gastrointestinal: Negative.   ?Musculoskeletal: Negative.   ?Neurological: Negative.   ?Psychiatric/Behavioral: Negative.    ?All other systems reviewed and are negative. ? ?PHYSICAL EXAM: ?VS:  BP 106/60 (BP Location: Left Arm, Patient Position: Sitting, Cuff Size: Normal)   Pulse (!) 57   Ht _0  (1.803 m)   Wt 108.5 kg   SpO2 98%   BMI 33.35 kg/m?  , BMI Body mass index is 33.35 kg/m?[place] Kitchen ?Constitutional:  oriented to person, place, and time. No distress.  ?HENT:  ?  Head: Grossly normal ?Eyes:  no discharge. No scleral icterus.  ?Neck: No JVD, no carotid bruits  ?Cardiovascular: Regular rate and rhythm, no murmurs appreciated ?Pulmonary/Chest: Clear to auscultation bilaterally, no wheezes or rails ?Abdominal: Soft.  no distension.  no tenderness.  Ostomy noted ?Musculoskeletal: Normal range of motion ?Neurological:  normal muscle tone. Coordination normal. No atrophy ?Skin: Skin warm and dry ?Psychiatric: normal affect, pleasant ? ?Recent Labs: ?07/07/2020: TSH 3.185 ?03/07/2021: B Natriuretic Peptide 133.1 ?04/05/2021: Magnesium 1.9 ?06/06/2021: ALT 36; BUN 14; Creatinine, Ser 1.14; Hemoglobin 15.1; Platelets 92; Potassium 3.7; Sodium 133  ? ? ?Lipid Panel ?Lab Results  ?Component Value Date  ? CHOL 174  07/07/2020  ? HDL 30 (L) 07/07/2020  ? LDLCALC 119 (H) 07/07/2020  ? TRIG 127 07/07/2020  ? ? ?Wt Readings from Last 3 Encounters:  ?06/08/21 108.5 kg  ?06/06/21 108.3 kg  ?05/23/21 107.1 kg  ?  ? ?ASSESSMENT AND PLAN: ?

## 2021-06-08 NOTE — Patient Instructions (Addendum)
DOT letter, in my chart ? ?Medication Instructions:  ?No changes ? ?If you need a refill on your cardiac medications before your next appointment, please call your pharmacy.  ? ?Lab work: ?No new labs needed ? ?Testing/Procedures: ?No new testing needed ? ?Follow-Up: ?At [facility], you and your health needs are our priority.  As part of our continuing mission to provide you with exceptional heart care, we have created designated Provider Care Teams.  These Care Teams include your primary Cardiologist (physician) and Advanced Practice Providers (APPs -  Physician Assistants and Nurse Practitioners) who all work together to provide you with the care you need, when you need it. ? ?You will need a follow up appointment in 6 months ? ?Providers on your designated Care Team:   ?Murray Hodgkins, NP ?Christell Faith, PA-C ?Cadence Kathlen Mody, PA-C ? ?COVID-19 Vaccine Information can be found at: [email or web address] For questions related to vaccine distribution or appointments, please email [email or web address] or call [phone].  ? ?

## 2021-06-09 DIAGNOSIS — G4733 Obstructive sleep apnea (adult) (pediatric): Secondary | ICD-10-CM | POA: Diagnosis not present

## 2021-06-14 ENCOUNTER — Ambulatory Visit: Payer: 59 | Admitting: Cardiovascular Disease

## 2021-06-19 ENCOUNTER — Encounter: Payer: Self-pay | Admitting: Surgery

## 2021-06-19 ENCOUNTER — Ambulatory Visit (INDEPENDENT_AMBULATORY_CARE_PROVIDER_SITE_OTHER): Payer: 59 | Admitting: Surgery

## 2021-06-19 VITALS — BP 128/74 | HR 54 | Temp 98.3°F | Ht 71.0 in | Wt 243.0 lb

## 2021-06-19 DIAGNOSIS — Z932 Ileostomy status: Secondary | ICD-10-CM

## 2021-06-19 DIAGNOSIS — C189 Malignant neoplasm of colon, unspecified: Secondary | ICD-10-CM

## 2021-06-19 DIAGNOSIS — C187 Malignant neoplasm of sigmoid colon: Secondary | ICD-10-CM

## 2021-06-19 DIAGNOSIS — Z09 Encounter for follow-up examination after completed treatment for conditions other than malignant neoplasm: Secondary | ICD-10-CM
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
## 2021-06-19 NOTE — Patient Instructions (Addendum)
We have spoken today about reversing your ostomy. We will wait to hear back from Dr B about when we may be able to schedule this.  ? ?Information on Ostomy Reversal. ? ?Plan on being in the hospital between 3-5 days after surgery. You will be started on a liquid diet and then advanced as tolerated prior to going home. ? ?If you have any disability or FMLA paperwork that needs to be filled out for your employer, please bring this in prior to surgery and it will be filled out upon your discharge from the hospital. We can give you a note anticipating your surgery date. If your employer is in need of this, please let us know. ? ?Please see your [facility]) Pre-Care Sheet for more information. If you have any questions, please call our office and ask for a nurse. ? ?End Colostomy Reversal ?An end colostomy reversal is surgery that reverses an end colostomy. The large intestine is disconnected from the opening in the abdomen (stoma). Then it is reconnected to the large intestine inside the body. A stoma and pouch are no longer needed. Bowel movements can resume through the rectum. ?LET [facility] CARE PROVIDER KNOW ABOUT: ?Allergies to food or medicine. ?Medicines taken, including vitamins, health supplements, herbs, eye drops, over-the-counter medicines, and creams. ?Use of steroids (by mouth or creams). ?Previous problems with anesthetics or numbing medicines. ?History of bleeding problems or blood clots. ?Previous surgery. ?Other health problems, including diabetes and kidney problems. ?Possibility of pregnancy, if this applies. ?RISKS AND COMPLICATIONS ?General surgical complications may include the following: ?Reaction to anesthetics. ?Damage to surrounding nerves, tissues, or structures. ?Blood clot. ?Bleeding. ?Scarring. ?Specific risks for colostomy reversal, while rare, may include: ?Intestinal paralysis (ileus). This is a normal part of recovery. It usually goes away in 3-7 days. However, it can last longer in some  people. ?Leaking at the joined part of the intestine (anastomotic leak). ?Infection of the surgical cut (incision) or the place where the stoma was located. ?A collection of pus (abscess) in the abdomen or pelvis. ?Intestinal blockage. ?Narrowing at the joined part of the intestine (stricture). ?Urinary and sexual dysfunction. ?BEFORE THE PROCEDURE ?It is important to follow your health care provider's instructions prior to your procedure. This will help you to avoid complications. Steps before your procedure may include: ?A physical exam, rectal exam, X-rays, colonoscopy, and other procedures. ?Chemotherapy or radiation therapy, if the stoma was created due to cancer. ?A review of the procedure, the anesthetic being used, and what to expect after the procedure. ?You may be asked to: ?Stop taking certain medicines for several days prior to your procedure. These may include blood thinners (such as aspirin). ?Take certain medicines, such as antibiotics or stool softeners. ?Avoid eating and drinking after midnight the night before the procedure. This will help you to avoid complications from the anesthetic. ?Quit smoking. Smoking increases the chances of a healing problem after your procedure. ?PROCEDURE ?You will be given medicine that makes you sleep (general anesthetic). The procedure may be done as open surgery, with a large incision. It may also be done as laparoscopic surgery, with several smaller incisions. The surgeon will stitch or staple the intestine ends back together. This surgery takes several hours. ?AFTER THE PROCEDURE ?You will be given pain medicine. ?Slowly increase your diet and movement as directed by your health care provider. ?You should arrange for someone to help you with activities at home while you recover. ?  ?This information is not intended to replace  advice given to you by your health care provider. Make sure you discuss any questions you have with your health care provider. ?  ?Document  Released: 05/21/2011 Document Revised: 07/13/2014 Document Reviewed: 05/21/2011 ?Elsevier Interactive Patient Education ?2016 [place]. ? ? ?
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
## 2021-06-20 ENCOUNTER — Inpatient Hospital Stay (HOSPITAL_BASED_OUTPATIENT_CLINIC_OR_DEPARTMENT_OTHER): Payer: 59 | Admitting: Internal Medicine

## 2021-06-20 ENCOUNTER — Encounter: Payer: Self-pay | Admitting: Internal Medicine

## 2021-06-20 ENCOUNTER — Inpatient Hospital Stay: Payer: 59 | Attending: Internal Medicine

## 2021-06-20 ENCOUNTER — Inpatient Hospital Stay: Payer: 59

## 2021-06-20 ENCOUNTER — Telehealth: Payer: Self-pay | Admitting: Surgery

## 2021-06-20 DIAGNOSIS — D751 Secondary polycythemia: Secondary | ICD-10-CM | POA: Diagnosis present

## 2021-06-20 DIAGNOSIS — Z87891 Personal history of nicotine dependence: Secondary | ICD-10-CM | POA: Insufficient documentation

## 2021-06-20 DIAGNOSIS — Z905 Acquired absence of kidney: Secondary | ICD-10-CM | POA: Insufficient documentation

## 2021-06-20 DIAGNOSIS — G4730 Sleep apnea, unspecified: Secondary | ICD-10-CM | POA: Diagnosis not present

## 2021-06-20 DIAGNOSIS — I4819 Other persistent atrial fibrillation: Secondary | ICD-10-CM | POA: Insufficient documentation

## 2021-06-20 DIAGNOSIS — I129 Hypertensive chronic kidney disease with stage 1 through stage 4 chronic kidney disease, or unspecified chronic kidney disease: Secondary | ICD-10-CM | POA: Diagnosis not present

## 2021-06-20 DIAGNOSIS — R202 Paresthesia of skin: Secondary | ICD-10-CM | POA: Diagnosis not present

## 2021-06-20 DIAGNOSIS — N1831 Chronic kidney disease, stage 3a: Secondary | ICD-10-CM | POA: Diagnosis not present

## 2021-06-20 DIAGNOSIS — E1122 Type 2 diabetes mellitus with diabetic chronic kidney disease: Secondary | ICD-10-CM | POA: Diagnosis not present

## 2021-06-20 DIAGNOSIS — C779 Secondary and unspecified malignant neoplasm of lymph node, unspecified: Secondary | ICD-10-CM | POA: Diagnosis not present

## 2021-06-20 DIAGNOSIS — R5383 Other fatigue: Secondary | ICD-10-CM | POA: Insufficient documentation

## 2021-06-20 DIAGNOSIS — I429 Cardiomyopathy, unspecified: Secondary | ICD-10-CM | POA: Insufficient documentation

## 2021-06-20 DIAGNOSIS — Z933 Colostomy status: Secondary | ICD-10-CM | POA: Insufficient documentation

## 2021-06-20 DIAGNOSIS — Z79899 Other long term (current) drug therapy: Secondary | ICD-10-CM | POA: Insufficient documentation

## 2021-06-20 DIAGNOSIS — Z833 Family history of diabetes mellitus: Secondary | ICD-10-CM | POA: Insufficient documentation

## 2021-06-20 DIAGNOSIS — R200 Anesthesia of skin: Secondary | ICD-10-CM | POA: Diagnosis not present

## 2021-06-20 DIAGNOSIS — Z7901 Long term (current) use of anticoagulants: Secondary | ICD-10-CM | POA: Diagnosis not present

## 2021-06-20 DIAGNOSIS — Z95828 Presence of other vascular implants and grafts: Secondary | ICD-10-CM

## 2021-06-20 DIAGNOSIS — C187 Malignant neoplasm of sigmoid colon: Secondary | ICD-10-CM

## 2021-06-20 DIAGNOSIS — Z87442 Personal history of urinary calculi: Secondary | ICD-10-CM | POA: Diagnosis not present

## 2021-06-20 DIAGNOSIS — Z8249 Family history of ischemic heart disease and other diseases of the circulatory system: Secondary | ICD-10-CM | POA: Insufficient documentation

## 2021-06-20 DIAGNOSIS — R809 Proteinuria, unspecified: Secondary | ICD-10-CM | POA: Insufficient documentation

## 2021-06-20 LAB — COMPREHENSIVE METABOLIC PANEL
ALT: 35 U/L (ref 0–44)
AST: 32 U/L (ref 15–41)
Albumin: 3.8 g/dL (ref 3.5–5.0)
Alkaline Phosphatase: 98 U/L (ref 38–126)
Anion gap: 7 (ref 5–15)
BUN: 9 mg/dL (ref 6–20)
CO2: 24 mmol/L (ref 22–32)
Calcium: 8.7 mg/dL — ABNORMAL LOW (ref 8.9–10.3)
Chloride: 101 mmol/L (ref 98–111)
Creatinine, Ser: 1.08 mg/dL (ref 0.61–1.24)
GFR, Estimated: >60 mL/min (ref >60)
Glucose, Bld: 134 mg/dL — ABNORMAL HIGH (ref 70–99)
Potassium: 4.1 mmol/L (ref 3.5–5.1)
Sodium: 132 mmol/L — ABNORMAL LOW (ref 135–145)
Total Bilirubin: 0.6 mg/dL (ref 0.3–1.2)
Total Protein: 7.4 g/dL (ref 6.5–8.1)

## 2021-06-20 LAB — CBC WITH DIFFERENTIAL/PLATELET
Abs Immature Granulocytes: 0.97 10*3/uL — ABNORMAL HIGH (ref 0.00–0.07)
Basophils Absolute: 0.2 10*3/uL — ABNORMAL HIGH (ref 0.0–0.1)
Basophils Relative: 2 %
Eosinophils Absolute: 0.2 10*3/uL (ref 0.0–0.5)
Eosinophils Relative: 2 %
HCT: 46.4 % (ref 39.0–52.0)
Hemoglobin: 15.2 g/dL (ref 13.0–17.0)
Immature Granulocytes: 12 %
Lymphocytes Relative: 13 %
Lymphs Abs: 1.1 10*3/uL (ref 0.7–4.0)
MCH: 27.6 pg (ref 26.0–34.0)
MCHC: 32.8 g/dL (ref 30.0–36.0)
MCV: 84.2 fL (ref 80.0–100.0)
Monocytes Absolute: 0.6 10*3/uL (ref 0.1–1.0)
Monocytes Relative: 8 %
Neutro Abs: 5.3 10*3/uL (ref 1.7–7.7)
Neutrophils Relative %: 63 %
Platelets: 78 10*3/uL — ABNORMAL LOW (ref 150–400)
RBC: 5.51 MIL/uL (ref 4.22–5.81)
RDW: 23.6 % — ABNORMAL HIGH (ref 11.5–15.5)
Smear Review: NORMAL
WBC: 8.4 10*3/uL (ref 4.0–10.5)
nRBC: 0.5 % — ABNORMAL HIGH (ref 0.0–0.2)

## 2021-06-20 MED ORDER — HEPARIN SOD (PORK) LOCK FLUSH 100 UNIT/ML IV SOLN
500.0000 [IU] | Freq: Once | INTRAVENOUS | Status: AC
  Administered 2021-06-20: 500 [IU] via INTRAVENOUS
  Filled 2021-06-20: qty 5

## 2021-06-20 NOTE — Assessment & Plan Note (Addendum)
#   Stage III colon cancer T3N2-rectosigmoid; distal 5 mm margin; MSS. currently on adjuvant chemo FOLFOX q 2w x12.  ? ?# s/p  FOLFOX cycle #4; HOLD #5 today of  planned #12 treatments. Labs today reviewed; hold chemotherapy given patient's preference to proceed with colostomy reversal.  Discussed with Dr.Pabone-plan surgery next week.  Will resume chemotherapy in about 2 weeks post surgery. ? ?#Hematology : neutropenia from the chemotherapy-s/p growth factor support improved; mild thrombocytopenia platelets 70s-from chemotherapy hopefully should improve by next week to proceed with surgery. ? ?# Solitary kidney- GFR- creat- 1.46 [GFR 58]; recent acute renal failure [JAN 2023-creatinine-5.3]; monitor closely. STABLE.  ? ?# Anemia- 12-13; JAN 2023- IDA-on Venofer.  ? ?# Loose stool in the colostomy bag-s/p colonic surgery.  Awaiting reversal next week see above. ? 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  ? ?# Hx of Afib-eliquis/amiodarone-monitor closely for bleeding tendencies on chemotherapy-STABLE.  ? ?# # IV access:port- Functional.  ? ?# DISPOSITION:  ?# HOLD chemo today; de-access potr; cancel appt for 4/13. ?# follow up in 3 weeks- MD; labs-cbc/cmp; FOLFOX; pump off D-3;Dr.B ? ? ? ?

## 2021-06-20 NOTE — Telephone Encounter (Signed)
Patient has been advised of Pre-Admission date/time, COVID Testing date and Surgery date. ? ?Surgery Date: 06/27/21 ?Preadmission Testing Date: 06/22/21 (phone 1p-5p) ?Covid Testing Date: Not needed.    ? ?Patient has been made aware to call [phone], between 1-3:00pm the day before surgery, to find out what time to arrive for surgery.   ? ?

## 2021-06-20 NOTE — Progress Notes (Signed)
Patient denies new problems/concerns today.  ? ?Patient has neuropathy that will last a few days after treatment.    ?
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
## 2021-06-21 ENCOUNTER — Other Ambulatory Visit: Payer: Self-pay

## 2021-06-21 DIAGNOSIS — Z85038 Personal history of other malignant neoplasm of large intestine: Secondary | ICD-10-CM | POA: Diagnosis not present

## 2021-06-21 DIAGNOSIS — Z932 Ileostomy status: Secondary | ICD-10-CM | POA: Diagnosis not present

## 2021-06-21 NOTE — Addendum Note (Signed)
Addended by: Caroleen Hamman F on: 06/21/2021 01:24 PM ? ? Modules accepted: Orders ? ?

## 2021-06-22 ENCOUNTER — Inpatient Hospital Stay: Payer: 59

## 2021-06-22 ENCOUNTER — Other Ambulatory Visit: Payer: Self-pay

## 2021-06-22 ENCOUNTER — Encounter
Admission: RE | Admit: 2021-06-22 | Discharge: 2021-06-22 | Disposition: A | Discharge status: Home / Self Care | Payer: 59 | Source: Ambulatory Visit | Attending: Surgery | Admitting: Surgery

## 2021-06-22 NOTE — Pre-Procedure Instructions (Signed)
Called down to lab to make sure T&S did not need to be drawn if platelets have to be given morning of surgery-Lab said T&S did not need to be done if only receiving platelets. Lab made aware possible platelet transfusion dos if needed ?

## 2021-06-22 NOTE — Pre-Procedure Instructions (Signed)
Tracy Merritts, MD  ?Physician ?Cardiology ?Progress Notes    ?Signed ?Encounter Date:  06/08/2021 ?  ?Signed    ?  ?Expand All Collapse All ?   ?   ?   ?   ?   ?   ?   ?   ?   ?   ?   ?   ?   ?   ?   ?   ?   ?   ?   ?   ?   ?   ?   ?   ?   ?   ?   ?   ?   ?   ?   ?   ?   ?   ?   ?   ?   ?   ?   ?   ?   ?   ?   ?   ?   ?   ?   ?   ?   ?   ?   ?   ?   ?   ?   ?   ?   ?   ?   ?   ?   ?   ?   ?   ?   ?   ?   ?   ?   ?   ?   ?   ?   ?   ?   ?   ?   ?   ?   ?   ?   ?   ?   ?   ?   ?   ?   ?   ?   ?   ?   ?   ?   ?   ?   ?   ?   ?   ?   ?   ?   ?   ?   ?   ?   ?   ?   ?   ?   ?   ?   ?   ?   ?   ?   ?   ?   ?   ?   ?   ?Cardiology Office Note ?  ?Date:  06/09/2021  ?  ?ID:  Tracy Gardner, DOB 1965/04/08, Tracy Gardner ?  ?PCP:  Tracy Gardner, Tracy Gardner  ?  ?    ?Chief Complaint  ?Patient presents with  ? 6 month follow up   ?    "Doing well." Medications reviewed by the patient verbally.   ?  ?  ?HPI:  ?Tracy Gardner is a 56 year old gentleman with past medical history of  ?smoking,  ?diabetes type 2 ?polycythemia,  ?hypertension,  ?hyperlipidemia,  ?borderline diabetes  ?presenting to the hospital with with acute respiratory distress, atrial fibrillation with RVR   ?Colon cancer, surgery ?OSA on CPAP ?Underwent TEE cardioversion July 07, 2020, EF 30 to 35% in A-fib ?Normalized ejection fraction July 2022 ?presents for routine follow-up of his atrial fibrillation ?  ?LOV  with myself 7/22 ?  ?In follow-up, we discussed recent events, diagnosis of colon cancer and treatment ?Was having diarrhea symptoms, ?12/22: colonscopy: mass noted, Stage III colon cancer ?Surgery for resection 1/23 ?Complications of small obstruction resolved /treated conservatively.   ?FEB 14th, 2023- FOLFOX ?Has an ostomy ?  ?Reports he is doing relatively well, ?Blood pressure low, "Lost 35 pounds" ?On chemo ?  ?  Denies tachyarrhythmia ?No regular exercise program ?  ?EKG personally reviewed by myself on todays visit ?Normal sinus rhythm rate 57  bpm no significant ST-T wave changes ?  ?Past medical history reviewed ?Admitted with bronchopneumonia April 2022, started on antibiotics, nebulizers ?History of smoking ?Atrial fibrillation with RVR ?Cardiomyopathy, ejection fraction 35 to 40% , felt secondary to cardiac arrhythmia though unable to exclude ischemia ?  ?Echocardiogram July 07, 2020 prior to cardioversion ? 1. Left ventricular ejection fraction, by estimation, is 35 to 40%. The  ?left ventricle has moderately decreased function. The left ventricle  ?demonstrates global hypokinesis. The left ventricular internal cavity size  ?was mildly dilated. Left ventricular  ?diastolic parameters are indeterminate.  ? 2. Right ventricular systolic function is normal. The right ventricular  ?size is normal. There is normal pulmonary artery systolic pressure.  ? 3. Left atrial size was mildly dilated.  ?  ?Underwent TEE cardioversion July 07, 2020 ?  ?PMH:   has a past medical history of Anemia, Asthma, persistent not controlled, Cancer ([place]), Cardiomyopathy ([place]), CKD (chronic kidney disease), stage III ([place]), Difficult intubation, Dysrhythmia, Essential hypertension, History of kidney stones, History of nephrectomy, unilateral, Hyperlipidemia, Morbid obesity ([place]), Persistent atrial fibrillation ([place]), Pneumonia, Polycythemia, Sleep apnea, Tobacco abuse, and Type II diabetes mellitus ([place]). ?  ?PSH:    ?     ?Past Surgical History:  ?Procedure Laterality Date  ? CARDIOVERSION N/A 07/08/2020  ?  Procedure: CARDIOVERSION;  Surgeon: Tracy Merritts, MD;  Location: [facility] ORS;  Service: Cardiovascular;  Laterality: N/A;  ? CARDIOVERSION N/A 08/05/2020  ?  Procedure: CARDIOVERSION;  Surgeon: Tracy Merritts, MD;  Location: [facility] ORS;  Service: Cardiovascular;  Laterality: N/A;  ? COLON RESECTION   03/23/2021  ? COLONOSCOPY N/A 03/09/2021  ?  Procedure: COLONOSCOPY;  Surgeon: Virgel Manifold, MD;  Location: [facility] ENDOSCOPY;  Service: Endoscopy;  Laterality: N/A;   ? ESOPHAGOGASTRODUODENOSCOPY N/A 03/09/2021  ?  Procedure: ESOPHAGOGASTRODUODENOSCOPY (EGD);  Surgeon: Virgel Manifold, MD;  Location: [facility] ENDOSCOPY;  Service: Endoscopy;  Laterality: N/A;  ? NEPHRECTOMY Right    ? PORTACATH PLACEMENT N/A 04/13/2021  ?  Procedure: INSERTION PORT-A-CATH;  Surgeon: Jules Husbands, MD;  Location: [facility] ORS;  Service: General;  Laterality: N/A;  Provider requesting 1 hour / 60 minutes for procedure.  ? TEE WITHOUT CARDIOVERSION N/A 07/08/2020  ?  Procedure: TRANSESOPHAGEAL ECHOCARDIOGRAM (TEE);  Surgeon: Tracy Merritts, MD;  Location: [facility] ORS;  Service: Cardiovascular;  Laterality: N/A;  ? TONSILLECTOMY      ?  ?  ?      ?Current Outpatient Medications  ?Medication Sig Dispense Refill  ? albuterol (VENTOLIN HFA) 108 (90 Base) MCG/ACT inhaler Inhale 2 puffs into the lungs 4 times a day as needed for shortness of breath, wheezing and cough 18 g 11  ? blood glucose meter kit and supplies KIT #Check blood glucose fasting/in the morning; after lunch; and after dinner. 1 each 0  ? Blood Pressure Monitoring (OMRON 3 SERIES BP MONITOR) DEVI Use as directed 1 each 0  ? cetirizine (ZYRTEC) 10 MG tablet Take 10 mg by mouth daily.      ? doxylamine, Sleep, (UNISOM) 25 MG tablet Take 25 mg by mouth at bedtime.      ? empagliflozin (JARDIANCE) 10 MG TABS tablet Take 1 tablet (10 mg total) by mouth daily. 30 tablet 11  ? fluticasone (FLOVENT HFA) 110 MCG/ACT inhaler Inhale into the lungs 2 (two) times daily. 12 g 12  ?  glucose blood (FREESTYLE LITE) test strip Use as directed to test 3 times daily 100 each 0  ? Lancets (FREESTYLE) lancets #Check blood glucose fasting/in the morning; after lunch; and after dinner. 100 each 12  ? lidocaine-prilocaine (EMLA) cream Apply to affected area once 30 g 3  ? loperamide (IMODIUM A-D) 2 MG tablet Take 1 tablet (2 mg total) by mouth 4 (four) times daily as needed for diarrhea or loose stools. 30 tablet 0  ? Multiple Vitamins-Minerals (MULTIVITAMIN WITH  MINERALS) tablet Take 1 tablet by mouth daily.      ? ondansetron (ZOFRAN) 4 MG tablet Take 1 tablet (4 mg total) by mouth every 8 (eight) hours as needed for nausea or vomiting. 20 tablet 0  ? prochlorperazine (COMPAZINE) 10 MG tablet Take 1 tablet (10 mg total) by mouth every 6 (six) hours as needed for nausea or vomiting. 40 tablet 1  ? amiodarone (PACERONE) 200 MG tablet Take 1 tablet (200 mg total) by mouth daily. Hold for HR less then 50 90 tablet 3  ? apixaban (ELIQUIS) 5 MG TABS tablet Take 1 tablet (5 mg total) by mouth 2 (two) times daily. 180 tablet 3  ? rosuvastatin (CRESTOR) 10 MG tablet Take 1 tablet (10 mg total) by mouth every evening. 90 tablet 3  ?  ?No current facility-administered medications for this visit.  ?  ?  ?Allergies:   Patient has no known allergies.  ?  ?Social History:  The patient  reports that he quit smoking about a year ago. His smoking use included cigarettes. He has a 40.00 pack-year smoking history. He has been exposed to tobacco smoke. He has never used smokeless tobacco. He reports that he does not currently use alcohol. He reports that he does not use drugs.  ?  ?Family History:   family history includes Atrial fibrillation in his mother; CAD in his mother; Diabetes in his father.  ?  ?Review of Systems: ?Review of Systems  ?Constitutional: Negative.   ?HENT: Negative.    ?Respiratory: Negative.    ?Cardiovascular: Negative.   ?Gastrointestinal: Negative.   ?Musculoskeletal: Negative.   ?Neurological: Negative.   ?Psychiatric/Behavioral: Negative.    ?All other systems reviewed and are negative. ?  ?PHYSICAL EXAM: ?VS:  BP 106/60 (BP Location: Left Arm, Patient Position: Sitting, Cuff Size: Normal)   Pulse (!) 57   Ht 5' 11"  (1.803 m)   Wt 108.5 kg   SpO2 98%   BMI 33.35 kg/m?  , BMI Body mass index is 33.35 kg/m?Marland Kitchen ?Constitutional:  oriented to person, place, and time. No distress.  ?HENT:  ?Head: Grossly normal ?Eyes:  no discharge. No scleral icterus.  ?Neck: No JVD,  no carotid bruits  ?Cardiovascular: Regular rate and rhythm, no murmurs appreciated ?Pulmonary/Chest: Clear to auscultation bilaterally, no wheezes or rails ?Abdominal: Soft.  no distension.  no tenderness.

## 2021-06-22 NOTE — Pre-Procedure Instructions (Signed)
ECHOCARDIOGRAM LIMITED REPORT    ? ?   ? ?Patient Name:   Tracy Gardner Date of Exam: 09/15/2020  ?Medical Rec #:  [id number]    Height:       71.0 in  ?Accession #:    [id number]   Weight:       261.5 lb  ?Date of Birth:  04/13/1965     BSA:          2.363 m?  ?Patient Age:    56 years     BP:           120/72 mmHg  ?Patient Gender: M            HR:           76 bpm.  ?Exam Location:  [place]  ? ?Procedure: Limited Echo, Limited Color Doppler and Strain Analysis  ? ?Indications:    I42.0 Dilated cardiomyopathy  ?   ?History:        Patient has prior history of Echocardiogram examinations,  ?most  ?                recent 07/08/2020. Cardiomyopathy, Arrythmias:Atrial  ?                Fibrillation, Signs/Symptoms:Syncope; Risk  ?Factors:Hypertension,  ?                Diabetes and Former Smoker. Patient stated he is feeling  ?well  ?                over the past few weeks.  ?   ?Sonographer:    Pilar Jarvis RDMS, RVT, RDCS  ?Referring Phys: [place]  ? ?   ?Sonographer Comments: LIMITED for LVEF  ?IMPRESSIONS  ? ? ? 1. Left ventricular ejection fraction, by estimation, is 55 %. The left  ?ventricle has normal function. The left ventricle has no regional wall  ?motion abnormalities. Left ventricular diastolic parameters are consistent  ?with Grade I diastolic dysfunction  ?(impaired relaxation).  ? 2. Right ventricular systolic function is normal. The right ventricular  ?size is normal. Tricuspid regurgitation signal is inadequate for assessing  ?PA pressure.  ? ?Comparison(s): TEE on 07/08/20 reported 30-35% LVEF  ?TTE on 07/07/20 reported 35-40% LVEF.  ? ?FINDINGS  ? Left Ventricle: Left ventricular ejection fraction, by estimation, is 55  ?%. The left ventricle has normal function. The left ventricle has no  ?regional wall motion abnormalities. The left ventricular internal cavity  ?size was normal in size. There is no  ?left ventricular hypertrophy. Left ventricular diastolic parameters are  ?consistent with  Grade I diastolic dysfunction (impaired relaxation).  ? ?Right Ventricle: The right ventricular size is normal. No increase in  ?right ventricular wall thickness. Right ventricular systolic function is  ?normal. Tricuspid regurgitation signal is inadequate for assessing PA  ?pressure.  ? ?Left Atrium: Left atrial size was normal in size.  ? ?Right Atrium: Right atrial size was normal in size.  ? ?Pericardium: There is no evidence of pericardial effusion.  ? ?Mitral Valve: The mitral valve is normal in structure. No evidence of  ?mitral valve stenosis.  ? ?Tricuspid Valve: The tricuspid valve is normal in structure. Tricuspid  ?valve regurgitation is not demonstrated. No evidence of tricuspid  ?stenosis.  ? ?Aortic Valve: The aortic valve was not well visualized. Aortic valve  ?regurgitation is not visualized. No aortic stenosis is present.  ? ?Pulmonic Valve: The pulmonic valve was normal in  structure. Pulmonic valve  ?regurgitation is not visualized. No evidence of pulmonic stenosis.  ? ?Aorta: The aortic root is normal in size and structure.  ? ?Venous: The inferior vena cava is normal in size with greater than 50%  ?respiratory variability, suggesting right atrial pressure of 3 mmHg.  ? ?IAS/Shunts: No atrial level shunt detected by color flow Doppler.  ? ?LEFT VENTRICLE  ?PLAX 2D  ?LVIDd:         5.10 cm      Diastology  ?LVIDs:         3.70 cm      LV e' medial:    7.51 cm/s  ?LV PW:         1.10 cm      LV E/e' medial:  8.6  ?LV IVS:        1.00 cm      LV e' lateral:   7.07 cm/s  ?                            LV E/e' lateral: 9.2  ?   ?LV Volumes (MOD)            2D Longitudinal Strain  ?LV vol d, MOD A2C: 90.6 ml  2D Strain GLS Avg:     -16.6 %  ?LV vol d, MOD A4C: 122.0 ml  ?LV vol s, MOD A2C: 43.1 ml  ?LV vol s, MOD A4C: 58.8 ml  ?LV SV MOD A2C:     47.5 ml  ?LV SV MOD A4C:     122.0 ml  ?LV SV MOD BP:      57.2 ml  ? ?RIGHT VENTRICLE  ?RV S prime:     17.10 cm/s  ?TAPSE (M-mode): 2.7 cm  ? ?MITRAL VALVE   ?MV Area (PHT): 3.33 cm?  ?MV Decel Time: 228 msec  ?MV E velocity: 64.70 cm/s  ?MV A velocity: 70.70 cm/s  ?MV E/A ratio:  0.92  ? ?[name] MD  ?Electronically signed by [name] MD  ?Signature Date/Time: 09/15/2020/5:13:04 PM  ?   ? ? ?  Final    ? ?Imaging Info ? ?ECHOCARDIOGRAM LIMITED (Order #[id number]) on 09/15/20  ? ?Study History ? ?ECHOCARDIOGRAM LIMITED (Order #[id number]) on 09/15/20  ? ?Syng[employer] Images ? ? Show images for ECHOCARDIOGRAM LIMITED ? ?Images on [employer] ? ? Show images for Chivers, Ashlee Bewley ? ?Performing Technologist/Nurse ? ?Performing Technologist/Nurse: Pilar Jarvis T ? ? ?Reason for Exam ?Priority: Routine ?Dx: Paroxysmal atrial fibrillation ([place]) [I48.0 (ICD-10-CM)]; Dilated cardiomyopathy ([place]) [I42.0 (ICD-10-CM)]  ? ?Surgical History ? ? ?Surgical History ? ?No past medical history on file.  ?  ? ?Other Surgical History ? ?Procedure Laterality Date Comment Source  ?CARDIOVERSION N/A 07/08/2020 Procedure: CARDIOVERSION;  Surgeon: Minna Merritts, MD;  Location: [facility] ORS;  Service: Cardiovascular;  Laterality: N/A;   ?CARDIOVERSION N/A 08/05/2020 Procedure: CARDIOVERSION;  Surgeon: Minna Merritts, MD;  Location: [facility] ORS;  Service: Cardiovascular;  Laterality: N/A;   ?COLON RESECTION  03/23/2021    ?COLONOSCOPY N/A 03/09/2021 Procedure: COLONOSCOPY;  Surgeon: Virgel Manifold, MD;  Location: [facility] ENDOSCOPY;  Service: Endoscopy;  Laterality: N/A;   ?ESOPHAGOGASTRODUODENOSCOPY N/A 03/09/2021 Procedure: ESOPHAGOGASTRODUODENOSCOPY (EGD);  Surgeon: Virgel Manifold, MD;  Location: [facility] ENDOSCOPY;  Service: Endoscopy;  Laterality: N/A;   ?NEPHRECTOMY Right     ?PORTACATH PLACEMENT N/A 04/13/2021 Procedure: INSERTION PORT-A-CATH;  Surgeon: Jules Husbands, MD;  Location: [facility] ORS;  Service: General;  Laterality: N/A;  Provider requesting 1 hour / 60 minutes for procedure.   ?TEE WITHOUT CARDIOVERSION N/A 07/08/2020 Procedure: TRANSESOPHAGEAL ECHOCARDIOGRAM (TEE);  Surgeon:  Minna Merritts, MD;  Location: [facility] ORS;  Service: Cardiovascular;  Laterality: N/A;   ?TONSILLECTOMY      ?  ? ?Implants    ? ?No active implants to display in this view.  ? ?Encounter-Level Documents on 09/15/2020: ? ?Electronic signature on 09/15/2020 3:58 PM - 1 of 3 e-signatures recorded ?  ?   ? ?Order-Level Documents on 09/15/2020: ? ?Scan on 09/15/2020 5:14 PM by Default, Provider, MD ?  ?   ? ?Resulted by: ? ?Signed Date/Time  Phone Pager  ?Minna Merritts 09/15/2020  5:13 PM [phone]   ? ?External Result Report ? ?External Result Report  ? ? ?ECHOCARDIOGRAM LIMITED: Patient Communication ? ? Add Comments   Seen  ? ? ?Existing Charges ? ?Insurance claims handler Code Status Charge Trigger Charge Type  ?[id number] Echo Heart Xthoracic,Limited [place] [employer] result study Global  ? ?

## 2021-06-22 NOTE — Patient Instructions (Signed)
Your procedure is scheduled on:06-27-21 Tuesday ?Report to the Registration Desk on the 1st floor of the [facility].Then proceed to the 2nd floor Surgery Desk in the [facility] ?To find out your arrival time, please call [phone] between 1PM - 3PM on:06-26-21 Monday ? ?REMEMBER: ?Instructions that are not followed completely may result in serious medical risk, up to and including death; or upon the discretion of your surgeon and anesthesiologist your surgery may need to be rescheduled. ? ?Do not eat food after midnight the night before surgery.  ?No gum chewing, lozengers or hard candies. ? ?You may however, drink Water up to 2 hours before you are scheduled to arrive for your surgery. Do not drink anything within 2 hours of your scheduled arrival time. ? ?Type 1 and Type 2 diabetics should only drink water. ? ?TAKE THESE MEDICATIONS THE MORNING OF SURGERY WITH A SIP OF WATER: ?-amiodarone (PACERONE)  ?-cetirizine (ZYRTEC) ? ?Use your albuterol (VENTOLIN HFA) Inhaler the day of surgery and bring Albuterol Inhaler to the hospital ? ?Stop your apixaban (ELIQUIS) 48 hours prior to surgery as instructed by Dr Blenda Nicely dose will be on 06-24-21 Saturday ? ?Stop your empagliflozin (JARDIANCE) 3 days prior to surgery-Last dose on 06-23-21 Friday ? ?Follow Dr. Corlis Leak bowel prep if one was given to you in his office ? ?One week prior to surgery: ?Stop Anti-inflammatories (NSAIDS) such as Advil, Aleve, Ibuprofen, Motrin, Naproxen, Naprosyn and Aspirin based products such as Excedrin, Goodys Powder, BC Powder.You may however, continue to take Tylenol if needed for pain up until the day of surgery. ? ?Stop ANY OVER THE COUNTER supplements/vitamins NOW (06-22-21) until after surgery (Multivitamin) ? ?No Alcohol for 24 hours before or after surgery. ? ?No Smoking including e-cigarettes for 24 hours prior to surgery.  ?No chewable tobacco products for at least 6 hours prior to surgery.  ?No nicotine patches on the day of  surgery. ? ?Do not use any "recreational" drugs for at least a week prior to your surgery.  ?Please be advised that the combination of cocaine and anesthesia may have negative outcomes, up to and including death. ?If you test positive for cocaine, your surgery will be cancelled. ? ?On the morning of surgery brush your teeth with toothpaste and water, you may rinse your mouth with mouthwash if you wish. ?Do not swallow any toothpaste or mouthwash. ? ?Use CHG Soap as directed on instruction sheet. ? ?Do not wear jewelry, make-up, hairpins, clips or nail polish. ? ?Do not wear lotions, powders, or perfumes.  ? ?Do not shave body from the neck down 48 hours prior to surgery just in case you cut yourself which could leave a site for infection.  ?Also, freshly shaved skin may become irritated if using the CHG soap. ? ?Contact lenses, hearing aids and dentures may not be worn into surgery. ? ?Do not bring valuables to the hospital. [facility] is not responsible for any missing/lost belongings or valuables.  ? ?Bring your C-PAP to the hospital with you ? ?Notify your doctor if there is any change in your medical condition (cold, fever, infection). ? ?Wear comfortable clothing (specific to your surgery type) to the hospital. ? ?After surgery, you can help prevent lung complications by doing breathing exercises.  ?Take deep breaths and cough every 1-2 hours. Your doctor may order a device called an Incentive Spirometer to help you take deep breaths. ?When coughing or sneezing, hold a pillow firmly against your incision with both hands. This is called ?  splinting.? Doing this helps protect your incision. It also decreases belly discomfort. ? ?If you are being admitted to the hospital overnight, leave your suitcase in the car. ?After surgery it may be brought to your room. ? ?If you are being discharged the day of surgery, you will not be allowed to drive home. ?You will need a responsible adult (18 years or older) to drive you  home and stay with you that night.  ? ?If you are taking public transportation, you will need to have a responsible adult (18 years or older) with you. ?Please confirm with your physician that it is acceptable to use public transportation.  ? ?Please call the [place] Dept. at [phone] if you have any questions about these instructions. ? ?Surgery Visitation Policy: ? ?Patients undergoing a surgery or procedure may have two family members or support persons with them as long as the person is not COVID-19 positive or experiencing its symptoms.  ? ?Inpatient Visitation:   ? ?Visiting hours are 7 a.m. to 8 p.m. ?Up to four visitors are allowed at one time in a patient room, including children. The visitors may rotate out with other people during the day. One designated support person (adult) may remain overnight.  ?

## 2021-06-23 DIAGNOSIS — C189 Malignant neoplasm of colon, unspecified: Secondary | ICD-10-CM | POA: Diagnosis not present

## 2021-06-26 NOTE — Patient Instructions (Signed)
Your procedure is scheduled on:06-27-21 Tuesday ?Report to the Registration Desk on the 1st floor of the [place].Then proceed to the 2nd floor Surgery Desk in the ?[facility] ?To find out your arrival time, please call [phone] between 1PM - 3PM on:06-26-21 Monday ? ? ? ?REMEMBER: ?Instructions that are not followed completely may result in serious medical risk, up to and including death; or ?upon the discretion of your surgeon and anesthesiologist your surgery may need to be rescheduled. ?Do not eat food after midnight the night before surgery. ?No gum chewing, lozengers or hard candies. ?You may however, drink Water up to 2 hours before you are scheduled to arrive for your surgery. Do not drink anything ?within 2 hours of your scheduled arrival time. ? ? ? ?Type 1 and Type 2 diabetics should only drink water. ? ? ? ?TAKE THESE MEDICATIONS THE MORNING OF SURGERY WITH A SIP OF WATER: ?-amiodarone (PACERONE) ?-cetirizine (ZYRTEC) ?Use your albuterol (VENTOLIN HFA) Inhaler the day of surgery and bring Albuterol Inhaler to the hospital ?Stop your apixaban (ELIQUIS) 48 hours prior to surgery as instructed by Dr Blenda Nicely dose will be on 06-24-21 ?Saturday ?Stop your empagliflozin (JARDIANCE) 3 days prior to surgery-Last dose on 06-23-21 Friday ? ? ?Follow Dr. Corlis Leak bowel prep if one was given to you in his office ? ? ?One week prior to surgery: ?Stop Anti-inflammatories (NSAIDS) such as Advil, Aleve, Ibuprofen, Motrin, Naproxen, Naprosyn and Aspirin based ?products such as Excedrin, Goodys Powder, BC Powder.You may however, continue to take Tylenol if needed for pain ?up until the day of surgery. ? ? ?Stop ANY OVER THE COUNTER supplements/vitamins NOW (06-22-21) until after surgery (Multivitamin) ?No Alcohol for 24 hours before or after surgery. ? ? ?No Smoking including e-cigarettes for 24 hours prior to surgery. ?No chewable tobacco products for at least 6 hours prior to surgery. ?No nicotine patches on the  day of surgery. ?Do not use any "recreational" drugs for at least a week prior to your surgery. ?Please be advised that the combination of cocaine and anesthesia may have negative outcomes, up to and including ?death. ?If you test positive for cocaine, your surgery will be cancelled. ? ?On the morning of surgery brush your teeth with toothpaste and water, you may rinse your mouth with mouthwash if ?you wish. ?Do not swallow any toothpaste or mouthwash. ?Use CHG Soap as directed on instruction sheet. ?Do not wear jewelry, make-up, hairpins, clips or nail polish. ?Do not wear lotions, powders, or perfumes. ?Do not shave body from the neck down 48 hours prior to surgery just in case you cut yourself which could leave a site for ?infection. ?Also, freshly shaved skin may become irritated if using the CHG soap. ?Contact lenses, hearing aids and dentures may not be worn into surgery. ? ? ?Do not bring valuables to the hospital. [facility] is not responsible for any missing/lost belongings or valuables. ? ?Notify your doctor if there is any change in your medical condition (cold, fever, infection). ? ?If you are being admitted to the hospital overnight, leave your suitcase in the car. ?After surgery it may be brought to your room. ?If you are being discharged the day of surgery, you will not be allowed to drive home. ? ? ?You will need a responsible adult (18 years or older) to drive you home and stay with you that night. ?If you are taking public transportation, you will need to have a responsible adult (18 years or older) with you. ?Please  confirm with your physician that it is acceptable to use public transportation. ?Please call the [facility] Dept. at [phone] if you have any questions about these instructions. ? ? ?Surgery Visitation Policy: ?Patients undergoing a surgery or procedure may have two family members or support persons with them as long as the ?person is not COVID-19 positive or  experiencing its symptoms. ? ? ?Inpatient Visitation: ?Visiting hours are 7 a.m. to 8 p.m. ? ?

## 2021-06-27 ENCOUNTER — Ambulatory Visit: Payer: 59 | Admitting: Anesthesiology

## 2021-06-27 ENCOUNTER — Other Ambulatory Visit: Payer: Self-pay

## 2021-06-27 ENCOUNTER — Encounter: Payer: Self-pay | Admitting: Surgery

## 2021-06-27 ENCOUNTER — Encounter
Admission: RE | Disposition: A | Discharge status: Home / Self Care | Payer: Self-pay | Source: Home / Self Care | Attending: Surgery

## 2021-06-27 ENCOUNTER — Inpatient Hospital Stay
Admission: RE | Admit: 2021-06-27 | Discharge: 2021-07-04 | DRG: 330 | Disposition: A | Discharge status: Home / Self Care | Payer: 59 | Attending: Surgery | Admitting: Surgery

## 2021-06-27 DIAGNOSIS — Z87891 Personal history of nicotine dependence: Secondary | ICD-10-CM | POA: Diagnosis not present

## 2021-06-27 DIAGNOSIS — K9189 Other postprocedural complications and disorders of digestive system: Secondary | ICD-10-CM | POA: Diagnosis not present

## 2021-06-27 DIAGNOSIS — Z9889 Other specified postprocedural states: Principal | ICD-10-CM | POA: Diagnosis present

## 2021-06-27 DIAGNOSIS — E1122 Type 2 diabetes mellitus with diabetic chronic kidney disease: Secondary | ICD-10-CM | POA: Diagnosis not present

## 2021-06-27 DIAGNOSIS — Z6831 Body mass index (BMI) 31.0-31.9, adult: Secondary | ICD-10-CM | POA: Diagnosis not present

## 2021-06-27 DIAGNOSIS — Z833 Family history of diabetes mellitus: Secondary | ICD-10-CM | POA: Diagnosis not present

## 2021-06-27 DIAGNOSIS — C19 Malignant neoplasm of rectosigmoid junction: Secondary | ICD-10-CM | POA: Diagnosis present

## 2021-06-27 DIAGNOSIS — R1030 Lower abdominal pain, unspecified: Secondary | ICD-10-CM | POA: Diagnosis not present

## 2021-06-27 DIAGNOSIS — E785 Hyperlipidemia, unspecified: Secondary | ICD-10-CM | POA: Diagnosis not present

## 2021-06-27 DIAGNOSIS — C187 Malignant neoplasm of sigmoid colon: Secondary | ICD-10-CM | POA: Diagnosis not present

## 2021-06-27 DIAGNOSIS — I4819 Other persistent atrial fibrillation: Secondary | ICD-10-CM | POA: Diagnosis not present

## 2021-06-27 DIAGNOSIS — C189 Malignant neoplasm of colon, unspecified: Secondary | ICD-10-CM

## 2021-06-27 DIAGNOSIS — J45909 Unspecified asthma, uncomplicated: Secondary | ICD-10-CM | POA: Diagnosis present

## 2021-06-27 DIAGNOSIS — N189 Chronic kidney disease, unspecified: Secondary | ICD-10-CM | POA: Diagnosis not present

## 2021-06-27 DIAGNOSIS — Z932 Ileostomy status: Secondary | ICD-10-CM | POA: Diagnosis not present

## 2021-06-27 DIAGNOSIS — R109 Unspecified abdominal pain: Secondary | ICD-10-CM | POA: Diagnosis not present

## 2021-06-27 DIAGNOSIS — N183 Chronic kidney disease, stage 3 unspecified: Secondary | ICD-10-CM | POA: Diagnosis not present

## 2021-06-27 DIAGNOSIS — Z4659 Encounter for fitting and adjustment of other gastrointestinal appliance and device: Secondary | ICD-10-CM | POA: Diagnosis not present

## 2021-06-27 DIAGNOSIS — Z8249 Family history of ischemic heart disease and other diseases of the circulatory system: Secondary | ICD-10-CM

## 2021-06-27 DIAGNOSIS — Z432 Encounter for attention to ileostomy: Principal | ICD-10-CM

## 2021-06-27 DIAGNOSIS — I129 Hypertensive chronic kidney disease with stage 1 through stage 4 chronic kidney disease, or unspecified chronic kidney disease: Secondary | ICD-10-CM | POA: Diagnosis present

## 2021-06-27 DIAGNOSIS — R140 Abdominal distension (gaseous): Secondary | ICD-10-CM | POA: Diagnosis not present

## 2021-06-27 DIAGNOSIS — G4730 Sleep apnea, unspecified: Secondary | ICD-10-CM | POA: Diagnosis present

## 2021-06-27 DIAGNOSIS — K6389 Other specified diseases of intestine: Secondary | ICD-10-CM | POA: Diagnosis not present

## 2021-06-27 DIAGNOSIS — K5730 Diverticulosis of large intestine without perforation or abscess without bleeding: Secondary | ICD-10-CM | POA: Diagnosis not present

## 2021-06-27 DIAGNOSIS — I4891 Unspecified atrial fibrillation: Secondary | ICD-10-CM | POA: Diagnosis not present

## 2021-06-27 DIAGNOSIS — Z905 Acquired absence of kidney: Secondary | ICD-10-CM

## 2021-06-27 DIAGNOSIS — J9811 Atelectasis: Secondary | ICD-10-CM | POA: Diagnosis not present

## 2021-06-27 DIAGNOSIS — K56699 Other intestinal obstruction unspecified as to partial versus complete obstruction: Secondary | ICD-10-CM | POA: Diagnosis not present

## 2021-06-27 DIAGNOSIS — D751 Secondary polycythemia: Secondary | ICD-10-CM | POA: Diagnosis present

## 2021-06-27 DIAGNOSIS — K567 Ileus, unspecified: Secondary | ICD-10-CM | POA: Diagnosis not present

## 2021-06-27 HISTORY — PX: ILEOSTOMY CLOSURE: SHX1784

## 2021-06-27 LAB — GLUCOSE, CAPILLARY
Glucose-Capillary: 116 mg/dL — ABNORMAL HIGH (ref 70–99)
Glucose-Capillary: 122 mg/dL — ABNORMAL HIGH (ref 70–99)
Glucose-Capillary: 159 mg/dL — ABNORMAL HIGH (ref 70–99)
Glucose-Capillary: 204 mg/dL — ABNORMAL HIGH (ref 70–99)
Glucose-Capillary: 225 mg/dL — ABNORMAL HIGH (ref 70–99)

## 2021-06-27 LAB — CBC WITH DIFFERENTIAL/PLATELET
Abs Immature Granulocytes: 0.11 10*3/uL — ABNORMAL HIGH (ref 0.00–0.07)
Basophils Absolute: 0.1 10*3/uL (ref 0.0–0.1)
Basophils Relative: 2 %
Eosinophils Absolute: 0.2 10*3/uL (ref 0.0–0.5)
Eosinophils Relative: 4 %
HCT: 46.9 % (ref 39.0–52.0)
Hemoglobin: 15.2 g/dL (ref 13.0–17.0)
Immature Granulocytes: 2 %
Lymphocytes Relative: 17 %
Lymphs Abs: 1.2 10*3/uL (ref 0.7–4.0)
MCH: 28.4 pg (ref 26.0–34.0)
MCHC: 32.4 g/dL (ref 30.0–36.0)
MCV: 87.5 fL (ref 80.0–100.0)
Monocytes Absolute: 0.8 10*3/uL (ref 0.1–1.0)
Monocytes Relative: 12 %
Neutro Abs: 4.2 10*3/uL (ref 1.7–7.7)
Neutrophils Relative %: 63 %
Platelets: 146 10*3/uL — ABNORMAL LOW (ref 150–400)
RBC: 5.36 MIL/uL (ref 4.22–5.81)
RDW: 22.5 % — ABNORMAL HIGH (ref 11.5–15.5)
Smear Review: NORMAL
WBC: 6.6 10*3/uL (ref 4.0–10.5)
nRBC: 0 % (ref 0.0–0.2)

## 2021-06-27 LAB — BPAM PLATELET PHERESIS
Blood Product Expiration Date: 202304202359
Unit Type and Rh: 6200

## 2021-06-27 LAB — PREPARE PLATELET PHERESIS: Unit division: 0

## 2021-06-27 SURGERY — CLOSURE, ILEOSTOMY
Anesthesia: General | Site: Abdomen

## 2021-06-27 MED ORDER — MIDAZOLAM HCL 2 MG/2ML IJ SOLN
INTRAMUSCULAR | Status: DC | PRN
Start: 2021-06-27 — End: 2021-06-27
  Administered 2021-06-27: 2 mg via INTRAVENOUS

## 2021-06-27 MED ORDER — SODIUM CHLORIDE 0.9 % IV SOLN
2.0000 g | INTRAVENOUS | Status: AC
  Administered 2021-06-27: 2 g via INTRAVENOUS

## 2021-06-27 MED ORDER — CHLORHEXIDINE GLUCONATE 0.12 % MT SOLN: 15.0000 mL | Freq: Once | OROMUCOSAL | Status: AC

## 2021-06-27 MED ORDER — BUPIVACAINE-EPINEPHRINE (PF) 0.25% -1:200000 IJ SOLN
INTRAMUSCULAR | Status: AC
  Filled 2021-06-27: qty 30

## 2021-06-27 MED ORDER — ENOXAPARIN SODIUM 40 MG/0.4ML IJ SOSY
40.0000 mg | PREFILLED_SYRINGE | INTRAMUSCULAR | Status: DC
  Administered 2021-06-28 – 2021-06-30 (×3): 40 mg via SUBCUTANEOUS
  Filled 2021-06-27 (×3): qty 0.4

## 2021-06-27 MED ORDER — DEXAMETHASONE SODIUM PHOSPHATE 10 MG/ML IJ SOLN
INTRAMUSCULAR | Status: AC
Start: 2021-06-27 — End: ?
  Filled 2021-06-27: qty 1

## 2021-06-27 MED ORDER — ACETAMINOPHEN 10 MG/ML IV SOLN
INTRAVENOUS | Status: AC
  Administered 2021-06-27: 1000 mg via INTRAVENOUS
  Filled 2021-06-27: qty 100

## 2021-06-27 MED ORDER — ALBUTEROL SULFATE (2.5 MG/3ML) 0.083% IN NEBU: 3.0000 mL | INHALATION_SOLUTION | Freq: Four times a day (QID) | RESPIRATORY_TRACT | Status: DC | PRN

## 2021-06-27 MED ORDER — ONDANSETRON HCL 4 MG/2ML IJ SOLN: 4.0000 mg | Freq: Once | INTRAMUSCULAR | Status: DC | PRN

## 2021-06-27 MED ORDER — ROCURONIUM BROMIDE 100 MG/10ML IV SOLN
INTRAVENOUS | Status: DC | PRN
  Administered 2021-06-27: 20 mg via INTRAVENOUS
  Administered 2021-06-27: 40 mg via INTRAVENOUS

## 2021-06-27 MED ORDER — LIDOCAINE HCL (PF) 2 % IJ SOLN
INTRAMUSCULAR | Status: AC
  Filled 2021-06-27: qty 5

## 2021-06-27 MED ORDER — SODIUM CHLORIDE 0.9 % IV SOLN: INTRAVENOUS | Status: DC

## 2021-06-27 MED ORDER — ONDANSETRON HCL 4 MG/2ML IJ SOLN
INTRAMUSCULAR | Status: AC
  Filled 2021-06-27: qty 2

## 2021-06-27 MED ORDER — DOXYLAMINE SUCCINATE (SLEEP) 25 MG PO TABS
25.0000 mg | ORAL_TABLET | Freq: Every evening | ORAL | Status: DC | PRN
  Administered 2021-06-30 – 2021-07-03 (×3): 25 mg via ORAL
  Filled 2021-06-27 (×4): qty 1

## 2021-06-27 MED ORDER — ACETAMINOPHEN 500 MG PO TABS
1000.0000 mg | ORAL_TABLET | Freq: Four times a day (QID) | ORAL | Status: DC
  Administered 2021-06-27 – 2021-06-30 (×8): 1000 mg via ORAL
  Filled 2021-06-27 (×9): qty 2

## 2021-06-27 MED ORDER — PROPOFOL 10 MG/ML IV BOLUS
INTRAVENOUS | Status: DC | PRN
  Administered 2021-06-27: 180 mg via INTRAVENOUS

## 2021-06-27 MED ORDER — CHLORHEXIDINE GLUCONATE CLOTH 2 % EX PADS: 6.0000 | MEDICATED_PAD | Freq: Once | CUTANEOUS | Status: DC

## 2021-06-27 MED ORDER — LORATADINE 10 MG PO TABS
10.0000 mg | ORAL_TABLET | Freq: Every day | ORAL | Status: DC
Start: 2021-06-28 — End: 2021-06-30
  Administered 2021-06-28 – 2021-06-30 (×3): 10 mg via ORAL
  Filled 2021-06-27 (×3): qty 1

## 2021-06-27 MED ORDER — INSULIN ASPART 100 UNIT/ML IJ SOLN
0.0000 [IU] | Freq: Every day | INTRAMUSCULAR | Status: DC
  Administered 2021-06-27: 2 [IU] via SUBCUTANEOUS
  Filled 2021-06-27: qty 1

## 2021-06-27 MED ORDER — ALVIMOPAN 12 MG PO CAPS
12.0000 mg | ORAL_CAPSULE | ORAL | Status: AC
  Administered 2021-06-28: 12 mg via ORAL
  Filled 2021-06-27: qty 1

## 2021-06-27 MED ORDER — CHLORHEXIDINE GLUCONATE CLOTH 2 % EX PADS
6.0000 | MEDICATED_PAD | Freq: Every day | CUTANEOUS | Status: DC
  Administered 2021-06-28 – 2021-07-04 (×8): 6 via TOPICAL

## 2021-06-27 MED ORDER — ONDANSETRON HCL 4 MG/2ML IJ SOLN
INTRAMUSCULAR | Status: DC | PRN
  Administered 2021-06-27: 4 mg via INTRAVENOUS

## 2021-06-27 MED ORDER — FENTANYL CITRATE (PF) 100 MCG/2ML IJ SOLN
INTRAMUSCULAR | Status: AC
  Filled 2021-06-27: qty 2

## 2021-06-27 MED ORDER — ORAL CARE MOUTH RINSE: 15.0000 mL | Freq: Once | OROMUCOSAL | Status: AC

## 2021-06-27 MED ORDER — SODIUM CHLORIDE 0.9 % IV SOLN
2.0000 g | Freq: Three times a day (TID) | INTRAVENOUS | Status: AC
  Administered 2021-06-27 – 2021-06-28 (×2): 2 g via INTRAVENOUS
  Filled 2021-06-27 (×2): qty 2

## 2021-06-27 MED ORDER — ACETAMINOPHEN 500 MG PO TABS
ORAL_TABLET | ORAL | Status: AC
  Administered 2021-06-27: 1000 mg via ORAL
  Filled 2021-06-27: qty 2

## 2021-06-27 MED ORDER — MIDAZOLAM HCL 2 MG/2ML IJ SOLN
INTRAMUSCULAR | Status: AC
  Filled 2021-06-27: qty 2

## 2021-06-27 MED ORDER — OXYCODONE HCL 5 MG/5ML PO SOLN: 5.0000 mg | Freq: Once | ORAL | Status: DC | PRN

## 2021-06-27 MED ORDER — PROPOFOL 10 MG/ML IV BOLUS
INTRAVENOUS | Status: AC
  Filled 2021-06-27: qty 20

## 2021-06-27 MED ORDER — SODIUM CHLORIDE 0.9% IV SOLUTION: Freq: Once | INTRAVENOUS | Status: DC

## 2021-06-27 MED ORDER — CELECOXIB 200 MG PO CAPS: 200.0000 mg | ORAL_CAPSULE | ORAL | Status: AC

## 2021-06-27 MED ORDER — PANTOPRAZOLE SODIUM 40 MG IV SOLR
40.0000 mg | Freq: Two times a day (BID) | INTRAVENOUS | Status: DC
  Administered 2021-06-27: 40 mg via INTRAVENOUS
  Filled 2021-06-27: qty 10

## 2021-06-27 MED ORDER — AMIODARONE HCL 200 MG PO TABS
200.0000 mg | ORAL_TABLET | ORAL | Status: DC
  Administered 2021-06-28 – 2021-07-04 (×7): 200 mg via ORAL
  Filled 2021-06-27 (×7): qty 1

## 2021-06-27 MED ORDER — ONDANSETRON 4 MG PO TBDP: 4.0000 mg | ORAL_TABLET | Freq: Four times a day (QID) | ORAL | Status: DC | PRN

## 2021-06-27 MED ORDER — ACETAMINOPHEN 500 MG PO TABS: 1000.0000 mg | ORAL_TABLET | ORAL | Status: AC

## 2021-06-27 MED ORDER — OXYCODONE HCL 5 MG PO TABS
5.0000 mg | ORAL_TABLET | ORAL | Status: DC | PRN
  Filled 2021-06-27 (×2): qty 2

## 2021-06-27 MED ORDER — GABAPENTIN 300 MG PO CAPS: 300.0000 mg | ORAL_CAPSULE | ORAL | Status: AC

## 2021-06-27 MED ORDER — KETOROLAC TROMETHAMINE 30 MG/ML IJ SOLN
30.0000 mg | Freq: Four times a day (QID) | INTRAMUSCULAR | Status: DC
  Administered 2021-06-27 – 2021-06-30 (×10): 30 mg via INTRAVENOUS
  Filled 2021-06-27 (×10): qty 1

## 2021-06-27 MED ORDER — CELECOXIB 200 MG PO CAPS
ORAL_CAPSULE | ORAL | Status: AC
  Administered 2021-06-27: 200 mg via ORAL
  Filled 2021-06-27: qty 1

## 2021-06-27 MED ORDER — BUPIVACAINE LIPOSOME 1.3 % IJ SUSP
INTRAMUSCULAR | Status: AC
  Filled 2021-06-27: qty 20

## 2021-06-27 MED ORDER — SUCCINYLCHOLINE CHLORIDE 200 MG/10ML IV SOSY
PREFILLED_SYRINGE | INTRAVENOUS | Status: DC | PRN
  Administered 2021-06-27: 140 mg via INTRAVENOUS

## 2021-06-27 MED ORDER — BUDESONIDE 0.25 MG/2ML IN SUSP
0.2500 mg | Freq: Two times a day (BID) | RESPIRATORY_TRACT | Status: DC
  Administered 2021-06-27 – 2021-06-28 (×2): 0.25 mg via RESPIRATORY_TRACT
  Filled 2021-06-27 (×2): qty 2

## 2021-06-27 MED ORDER — FENTANYL CITRATE (PF) 100 MCG/2ML IJ SOLN
INTRAMUSCULAR | Status: AC
  Administered 2021-06-27: 25 ug via INTRAVENOUS
  Filled 2021-06-27: qty 2

## 2021-06-27 MED ORDER — DEXAMETHASONE SODIUM PHOSPHATE 10 MG/ML IJ SOLN
INTRAMUSCULAR | Status: DC | PRN
  Administered 2021-06-27: 5 mg via INTRAVENOUS

## 2021-06-27 MED ORDER — ONDANSETRON HCL 4 MG/2ML IJ SOLN: 4.0000 mg | Freq: Four times a day (QID) | INTRAMUSCULAR | Status: DC | PRN

## 2021-06-27 MED ORDER — FENTANYL CITRATE (PF) 100 MCG/2ML IJ SOLN
INTRAMUSCULAR | Status: DC | PRN
  Administered 2021-06-27 (×2): 50 ug via INTRAVENOUS

## 2021-06-27 MED ORDER — INSULIN ASPART 100 UNIT/ML IJ SOLN
0.0000 [IU] | Freq: Three times a day (TID) | INTRAMUSCULAR | Status: DC
  Administered 2021-06-28: 3 [IU] via SUBCUTANEOUS
  Administered 2021-06-28: 4 [IU] via SUBCUTANEOUS
  Administered 2021-06-28 – 2021-06-29 (×3): 3 [IU] via SUBCUTANEOUS
  Filled 2021-06-27 (×6): qty 1

## 2021-06-27 MED ORDER — OXYCODONE HCL 5 MG PO TABS: 5.0000 mg | ORAL_TABLET | Freq: Once | ORAL | Status: DC | PRN

## 2021-06-27 MED ORDER — FENTANYL CITRATE (PF) 100 MCG/2ML IJ SOLN: 25.0000 ug | INTRAMUSCULAR | Status: DC | PRN

## 2021-06-27 MED ORDER — SODIUM CHLORIDE 0.9 % IV SOLN
INTRAVENOUS | Status: AC
  Filled 2021-06-27: qty 2

## 2021-06-27 MED ORDER — MORPHINE SULFATE (PF) 2 MG/ML IV SOLN
2.0000 mg | INTRAVENOUS | Status: DC | PRN
  Administered 2021-06-28: 2 mg via INTRAVENOUS
  Filled 2021-06-27: qty 1

## 2021-06-27 MED ORDER — SODIUM CHLORIDE (PF) 0.9 % IJ SOLN
INTRAMUSCULAR | Status: AC
  Filled 2021-06-27: qty 50

## 2021-06-27 MED ORDER — INSULIN ASPART 100 UNIT/ML IJ SOLN
6.0000 [IU] | Freq: Three times a day (TID) | INTRAMUSCULAR | Status: DC
  Administered 2021-06-27 – 2021-07-04 (×15): 6 [IU] via SUBCUTANEOUS
  Filled 2021-06-27 (×14): qty 1

## 2021-06-27 MED ORDER — SODIUM CHLORIDE (PF) 0.9 % IJ SOLN
INTRAMUSCULAR | Status: DC | PRN
  Administered 2021-06-27: 100 mL via INTRAMUSCULAR

## 2021-06-27 MED ORDER — LACTATED RINGERS IV SOLN: INTRAVENOUS | Status: DC

## 2021-06-27 MED ORDER — PROCHLORPERAZINE MALEATE 10 MG PO TABS
10.0000 mg | ORAL_TABLET | Freq: Four times a day (QID) | ORAL | Status: DC | PRN
  Filled 2021-06-27: qty 1

## 2021-06-27 MED ORDER — GABAPENTIN 300 MG PO CAPS
ORAL_CAPSULE | ORAL | Status: AC
  Administered 2021-06-27: 300 mg via ORAL
  Filled 2021-06-27: qty 1

## 2021-06-27 MED ORDER — ROCURONIUM BROMIDE 10 MG/ML (PF) SYRINGE
PREFILLED_SYRINGE | INTRAVENOUS | Status: AC
  Filled 2021-06-27: qty 10

## 2021-06-27 MED ORDER — CHLORHEXIDINE GLUCONATE 0.12 % MT SOLN
OROMUCOSAL | Status: AC
Start: 2021-06-27 — End: 2021-06-27
  Administered 2021-06-27: 15 mL via OROMUCOSAL
  Filled 2021-06-27: qty 15

## 2021-06-27 MED ORDER — ACETAMINOPHEN 10 MG/ML IV SOLN: 1000.0000 mg | Freq: Once | INTRAVENOUS | Status: DC | PRN

## 2021-06-27 MED ORDER — FAMOTIDINE 20 MG PO TABS: 20.0000 mg | ORAL_TABLET | Freq: Once | ORAL | Status: AC

## 2021-06-27 MED ORDER — ALBUTEROL SULFATE HFA 108 (90 BASE) MCG/ACT IN AERS
INHALATION_SPRAY | RESPIRATORY_TRACT | Status: DC | PRN
  Administered 2021-06-27: 2 via RESPIRATORY_TRACT

## 2021-06-27 MED ORDER — FAMOTIDINE 20 MG PO TABS
ORAL_TABLET | ORAL | Status: AC
  Administered 2021-06-27: 20 mg via ORAL
  Filled 2021-06-27: qty 1

## 2021-06-27 MED ORDER — PROCHLORPERAZINE EDISYLATE 10 MG/2ML IJ SOLN: 5.0000 mg | Freq: Four times a day (QID) | INTRAMUSCULAR | Status: DC | PRN

## 2021-06-27 MED ORDER — 0.9 % SODIUM CHLORIDE (POUR BTL) OPTIME
TOPICAL | Status: DC | PRN
  Administered 2021-06-27: 500 mL

## 2021-06-27 SURGICAL SUPPLY — 41 items
BLADE CLIPPER SURG (BLADE) ×2 IMPLANT
BLADE SURG 15 STRL LF DISP TIS (BLADE) ×1 IMPLANT
BLADE SURG 15 STRL SS (BLADE) ×1
CNTNR SPEC 2.5X3XGRAD LEK (MISCELLANEOUS) ×1
CONT SPEC 4OZ STER OR WHT (MISCELLANEOUS) ×1
CONTAINER SPEC 2.5X3XGRAD LEK (MISCELLANEOUS) IMPLANT
DRAPE LAPAROTOMY 100X77 ABD (DRAPES) ×2 IMPLANT
DRSG OPSITE POSTOP 4X6 (GAUZE/BANDAGES/DRESSINGS) ×1 IMPLANT
DRSG TEGADERM 4X4.75 (GAUZE/BANDAGES/DRESSINGS) ×1 IMPLANT
ELECT BLADE 6.5 EXT (BLADE) ×2 IMPLANT
ELECT CAUTERY BLADE 6.4 (BLADE) ×2 IMPLANT
ELECT REM PT RETURN 9FT ADLT (ELECTROSURGICAL) ×2
ELECTRODE REM PT RTRN 9FT ADLT (ELECTROSURGICAL) ×1 IMPLANT
GAUZE 4X4 16PLY ~~LOC~~+RFID DBL (SPONGE) ×2 IMPLANT
GAUZE PACKING 1/2INX5YD STRL (GAUZE/BANDAGES/DRESSINGS) ×1 IMPLANT
GAUZE SPONGE 4X4 12PLY STRL (GAUZE/BANDAGES/DRESSINGS) ×4 IMPLANT
GLOVE BIO SURGEON STRL SZ7 (GLOVE) ×4 IMPLANT
GOWN STRL REUS W/ TWL LRG LVL3 (GOWN DISPOSABLE) ×4 IMPLANT
GOWN STRL REUS W/TWL LRG LVL3 (GOWN DISPOSABLE) ×4
HANDLE SUCTION POOLE (INSTRUMENTS) ×1 IMPLANT
MANIFOLD NEPTUNE II (INSTRUMENTS) ×2 IMPLANT
NEEDLE HYPO 22GX1.5 SAFETY (NEEDLE) ×2 IMPLANT
NS IRRIG 500ML POUR BTL (IV SOLUTION) ×1 IMPLANT
PACK BASIN MAJOR ARMC (MISCELLANEOUS) ×2 IMPLANT
SOL PREP PVP 2OZ (MISCELLANEOUS)
SOLUTION PREP PVP 2OZ (MISCELLANEOUS) IMPLANT
SPONGE T-LAP 18X18 ~~LOC~~+RFID (SPONGE) ×8 IMPLANT
STAPLER PROXIMATE 75MM BLUE (STAPLE) IMPLANT
STAPLER SKIN PROX 35W (STAPLE) ×2 IMPLANT
SUCTION POOLE HANDLE (INSTRUMENTS) ×2
SUT PDS AB 0 CT1 27 (SUTURE) ×2 IMPLANT
SUT SILK 2 0 (SUTURE) ×1
SUT SILK 2 0SH CR/8 30 (SUTURE) ×3 IMPLANT
SUT SILK 2-0 18XBRD TIE 12 (SUTURE) IMPLANT
SUT VIC AB 2-0 SH 27 (SUTURE) ×2
SUT VIC AB 2-0 SH 27XBRD (SUTURE) ×1 IMPLANT
SUT VIC AB 3-0 SH 27 (SUTURE) ×1
SUT VIC AB 3-0 SH 27X BRD (SUTURE) ×1 IMPLANT
SYR 20ML LL LF (SYRINGE) ×4 IMPLANT
SYR TOOMEY 50ML (SYRINGE) IMPLANT
TRAY FOLEY MTR SLVR 16FR STAT (SET/KITS/TRAYS/PACK) IMPLANT

## 2021-06-27 NOTE — TOC Initial Note (Signed)
Transition of Care (TOC) - Initial/Assessment Note  ? ? ?Patient Details  ?Name: Ellen Mayol Bonczek ?MRN: [id number] ?Date of Birth: 1965-05-16 ? ?Transition of Care (TOC) CM/SW Contact:    ?Beverly Sessions, RN ?Phone Number: ?06/27/2021, 2:38 PM ? ?Clinical Narrative:                 ? ? ?  ?Transition of Care (TOC) Screening Note ? ? ?Patient Details  ?Name: Kendarious Gudino Heinrich ?Date of Birth: Mar 02, 1966 ? ? ?Transition of Care (TOC) CM/SW Contact:    ?Beverly Sessions, RN ?Phone Number: ?06/27/2021, 2:39 PM ? ? ? ?Transition of Care Department [facility]) has reviewed patient and no TOC needs have been identified at this time. We will continue to monitor patient advancement through interdisciplinary progression rounds. If new patient transition needs arise, please place a TOC consult. ?  ? ?  ? ? ?Patient Goals and CMS Choice ?  ?  ?  ? ?Expected Discharge Plan and Services ?  ?  ?  ?  ?  ?                ?  ?  ?  ?  ?  ?  ?  ?  ?  ?  ? ?Prior Living Arrangements/Services ?  ?  ?  ?       ?  ?  ?  ?  ? ?Activities of Daily Living ?Home Assistive Devices/Equipment: None ?ADL Screening (condition at time of admission) ?Patient's cognitive ability adequate to safely complete daily activities?: Yes ?Is the patient deaf or have difficulty hearing?: No ?Does the patient have difficulty seeing, even when wearing glasses/contacts?: No ?Does the patient have difficulty concentrating, remembering, or making decisions?: No ?Patient able to express need for assistance with ADLs?: Yes ?Does the patient have difficulty dressing or bathing?: No ?Independently performs ADLs?: Yes (appropriate for developmental age) ?Does the patient have difficulty walking or climbing stairs?: No ?Weakness of Legs: None ?Weakness of Arms/Hands: None ? ?Permission Sought/Granted ?  ?  ?   ?   ?   ?   ? ?Emotional Assessment ?  ?  ?  ?  ?  ?  ? ?Admission diagnosis:  S/P closure of ileostomy [Z98.890] ?Patient Active Problem List  ? Diagnosis Date Noted  ? S/P closure  of ileostomy 06/27/2021  ? Abdominal pain 04/04/2021  ? AKI (acute kidney injury) ([place]) 04/04/2021  ? S/P laparoscopic colectomy 03/23/2021  ? Cancer of sigmoid ([place]) 03/10/2021  ? Iron deficiency anemia due to chronic blood loss 03/10/2021  ? Adenocarcinoma of colon ([place])   ? Melena   ? Gastric erythema   ? Hematochezia 03/08/2021  ? OSA on CPAP 10/24/2020  ? Loud snoring 07/27/2020  ? Acute HFrEF (heart failure with reduced ejection fraction) ([place])   ? Cardiomyopathy ([place])   ? Paroxysmal atrial fibrillation ([place]) 07/07/2020  ? Bronchial pneumonia 07/07/2020  ? Diabetes mellitus type 2 with complications ([place]) 40/98/1191  ? Anxiety, generalized 06/11/2019  ? Chronic kidney disease, stage 3a ([place]) 01/15/2019  ? Hyperlipidemia 01/15/2019  ? Proteinuria, unspecified 01/15/2019  ? Class 2 severe obesity with serious comorbidity and body mass index (BMI) of 36.0 to 36.9 in adult [facility]) 03/25/2018  ? Erythrocytosis 12/03/2017  ? Acquired absence of kidney 05/15/2017  ? Asthma 03/14/2017  ? Essential hypertension 03/14/2017  ? ?PCP:  [name], PA-C ?Pharmacy:   ?[facility] ?[address] ?[place] [address] ?Phone:  [phone] Fax: [phone] ? ? ? ? ?Social Determinants of Health (SDOH) Interventions ?  ? ?Readmission Risk Interventions ?   ? View : No data to display.  ?  ?  ?  ? ? ? ?

## 2021-06-27 NOTE — Anesthesia Postprocedure Evaluation (Signed)
Anesthesia Post Note ? ?Patient: Tracy Gardner ? ?Procedure(s) Performed: ILEOSTOMY TAKEDOWN (Abdomen) ? ?Patient location during evaluation: PACU ?Anesthesia Type: General ?Level of consciousness: awake and alert, oriented and patient cooperative ?Pain management: pain level controlled ?Vital Signs Assessment: post-procedure vital signs reviewed and stable ?Respiratory status: spontaneous breathing, nonlabored ventilation and respiratory function stable ?Cardiovascular status: blood pressure returned to baseline and stable ?Postop Assessment: adequate PO intake ?Anesthetic complications: no ? ? ?No notable events documented. ? ? ?Last Vitals:  ?Vitals:  ? 06/27/21 1200 06/27/21 1215  ?BP: 114/68 113/71  ?Pulse: (!) 59 61  ?Resp: 13 (!) 6  ?Temp:    ?SpO2: 93% 92%  ?  ?Last Pain:  ?Vitals:  ? 06/27/21 1200  ?TempSrc:   ?PainSc: 0-No pain  ? ? ?  ?  ?  ?  ?  ?  ? ?Darrin Nipper ? ? ? ? ?

## 2021-06-27 NOTE — Interval H&P Note (Signed)
History and Physical Interval Note: ? ?06/27/2021 ?7:38 AM ? ?Tracy Gardner  has presented today for surgery, with the diagnosis of ileostomy in place.  The various methods of treatment have been discussed with the patient and family. After consideration of risks, benefits and other options for treatment, the patient has consented to  Procedure(s): ?ILEOSTOMY TAKEDOWN (N/A) as a surgical intervention.  The patient's history has been reviewed, patient examined, no change in status, stable for surgery.  I have reviewed the patient's chart and labs.  Questions were answered to the patient's satisfaction.   ?I specifically d/w him about potential need for PL and blood transfusion. He understands. He stopped eliquis 48 hrs ago as instructed ? ?Merlin ? ? ?

## 2021-06-27 NOTE — Op Note (Addendum)
PROCEDURES: ?1. Ileostomy takedown ? ?Pre-operative Diagnosis:HX rectosigmoid CA ultra low LAR and loop ileostomy ? ?Post-operative Diagnosis: same ? ?Surgeon: Jules Husbands MD FACS ? ?Assistants: Otho Ket PA-C ? ?Anesthesia: General endotracheal anesthesia ? ?ASA Class: 3 ? ?Surgeon: Caroleen Hamman , MD FACS ? ?Anesthesia: Gen. with endotracheal tube ? ?Findings: ?No evidence of intra op leak ?Widely patent anastomosis ? ?Estimated Blood Loss: 10cc ?        ?Complications: none        ?       ?Condition: stable ? ?Procedure Details  ?The patient was seen again in the Holding Room. The benefits, complications, treatment options, and expected outcomes were discussed with the patient. The risks of bleeding, infection, recurrence of symptoms, failure to resolve symptoms,  bowel injury, any of which could require further surgery were reviewed with the patient.   The patient was taken to Operating Room, identified as Hashir Deleeuw Housholder and the procedure verified.  A Time Out was held and the above information confirmed. ? ?Prior to the induction of general anesthesia, antibiotic prophylaxis was administered. VTE prophylaxis was in place. General endotracheal anesthesia was then administered and tolerated well.  ?I removed the ostomy appliance and placed Betadine soaked gauze and Tegaderm. ?After the induction, the abdomen was prepped with Chloraprep and draped in the sterile fashion. The patient was positioned in the supine position ? ?There was removed an elliptical incision was created around the stoma.  Electrocautery was used to dissect through subcutaneous tissue until we reached the fascia.  The bowel was divided from the fascial defect in a circumferential fashion.  We were also able to clean and resect a small remanent and rim of the small bowel surrounding the skin to have good healthy tissue for the anastomosis.  We performed an anastomosis in a 2 layer fashion.  First layer was done with a continues 2-0 Vicryl.   Second layer was interrupted Lembert's.  There was no evidence of an anastomotic leak and the anastomosis was likely patent and with excellent perfusion and no tension.  The small bowel was pushed back into the abdominal cavity. ?The fascia was closed in a 2 layer fashion using a continuous-0 PDS suture for both the anterior and posterior sheath . The skin was closed with staples and wicks in between. Liposomal Marcaine  was injected on all incision sites under direct visualization. ? Needle and laparotomy count were correct and there were no immediate occasions. ?please note that Mr. Freda Jackson was essential for the operation due to the lack of first assist, patient's large body habitus and complexity of the procedure.  He provided significant exposure and assistance with anastomosis. ? ?Caroleen Hamman, MD, FACS ? ?  ?

## 2021-06-27 NOTE — Anesthesia Procedure Notes (Signed)
Procedure Name: Intubation ?Date/Time: 06/27/2021 9:29 AM ?Performed by: Fredderick Phenix, CRNA ?Pre-anesthesia Checklist: Patient identified, Emergency Drugs available, Suction available and Patient being monitored ?Patient Re-evaluated:Patient Re-evaluated prior to induction ?Oxygen Delivery Method: Circle system utilized ?Preoxygenation: Pre-oxygenation with 100% oxygen ?Induction Type: IV induction ?Ventilation: Mask ventilation without difficulty ?Laryngoscope Size: Glidescope ?Grade View: Grade II ?Tube type: Oral ?Tube size: 7.5 mm ?Number of attempts: 1 ?Airway Equipment and Method: Stylet and Oral airway ?Placement Confirmation: ETT inserted through vocal cords under direct vision, positive ETCO2 and breath sounds checked- equal and bilateral ?Secured at: 23 cm ?Tube secured with: Tape ?Dental Injury: Teeth and Oropharynx as per pre-operative assessment  ? ? ? ? ?

## 2021-06-27 NOTE — Anesthesia Preprocedure Evaluation (Signed)
Anesthesia Evaluation  ?Patient identified by MRN, date of birth, ID band ?Patient awake ? ? ? ?Reviewed: ?Allergy & Precautions, NPO status , Patient's Chart, lab work & pertinent test results ? ?History of Anesthesia Complications ?(+) DIFFICULT AIRWAY and history of anesthetic complications ? ?Airway ?Mallampati: IV ? ? ?Neck ROM: Full ? ? ? Dental ? ? ?Many missing teeth:   ?Pulmonary ?asthma , sleep apnea , former smoker,  ?  ?Pulmonary exam normal ?breath sounds clear to auscultation ? ? ? ? ? ? Cardiovascular ?hypertension, +CHF (cardiomyopathy, EF 35-40%)  ?Normal cardiovascular exam+ dysrhythmias (a fib on Eliquis)  ?Rhythm:Regular Rate:Normal ? ?ECG 06/08/21: Sinus bradycardia (HR 57); otherwise normal ?  ?Neuro/Psych ?negative neurological ROS ?   ? GI/Hepatic ?Colon adenocarcinoma ?  ?Endo/Other  ?diabetes, Type 2Obesity  ? Renal/GU ?Renal disease (stage III CKD; hx nephrectomy as child; nephrolithiasis)  ? ?  ?Musculoskeletal ? ? Abdominal ?  ?Peds ? Hematology ? ?(+) Blood dyscrasia (polycythemia), ,   ?Anesthesia Other Findings ? ? Reproductive/Obstetrics ? ?  ? ? ? ? ? ? ? ? ? ? ? ? ? ?  ?  ? ? ? ? ? ? ? ? ?Anesthesia Physical ? ?Anesthesia Plan ? ?ASA: 3 ? ?Anesthesia Plan: General  ? ?Post-op Pain Management:   ? ?Induction: Intravenous ? ?PONV Risk Score and Plan: 2 and Treatment may vary due to age or medical condition, Ondansetron and Dexamethasone ? ?Airway Management Planned: Oral ETT ? ?Additional Equipment:  ? ?Intra-op Plan:  ? ?Post-operative Plan: Extubation in OR ? ?Informed Consent: I have reviewed the patients History and Physical, chart, labs and discussed the procedure including the risks, benefits and alternatives for the proposed anesthesia with the patient or authorized representative who has indicated his/her understanding and acceptance.  ? ? ? ?Dental advisory given ? ?Plan Discussed with: CRNA ? ?Anesthesia Plan Comments: (Patient consented for  risks of anesthesia including but not limited to:  ?- adverse reactions to medications ?- damage to eyes, teeth, lips or other oral mucosa ?- nerve damage due to positioning  ?- sore throat or hoarseness ?- damage to heart, brain, nerves, lungs, other parts of body or loss of life ? ?Informed patient about role of CRNA in peri- and intra-operative care.  Patient voiced understanding.)  ? ? ? ? ? ? ?Anesthesia Quick Evaluation ? ?

## 2021-06-27 NOTE — Transfer of Care (Signed)
Immediate Anesthesia Transfer of Care Note ? ?Patient: Tracy Gardner ? ?Procedure(s) Performed: ILEOSTOMY TAKEDOWN (Abdomen) ? ?Patient Location: PACU ? ?Anesthesia Type:General ? ?Level of Consciousness: awake and alert  ? ?Airway & Oxygen Therapy: Patient Spontanous Breathing and Patient connected to nasal cannula oxygen ? ?Post-op Assessment: Report given to RN and Post -op Vital signs reviewed and stable ? ?Post vital signs: Reviewed and stable ? ?Last Vitals:  ?Vitals Value Taken Time  ?BP 124/67 06/27/21 1109  ?Temp 36.2 ?C 06/27/21 1109  ?Pulse 67 06/27/21 1112  ?Resp 22 06/27/21 1112  ?SpO2 98 % 06/27/21 1112  ?Vitals shown include unvalidated device data. ? ?Last Pain:  ?Vitals:  ? 06/27/21 0700  ?TempSrc: Oral  ?PainSc: 0-No pain  ?   ? ?  ? ?Complications: No notable events documented. ?

## 2021-06-28 ENCOUNTER — Inpatient Hospital Stay: Payer: 59

## 2021-06-28 LAB — CBC
HCT: 41.3 % (ref 39.0–52.0)
Hemoglobin: 13.5 g/dL (ref 13.0–17.0)
MCH: 28.1 pg (ref 26.0–34.0)
MCHC: 32.7 g/dL (ref 30.0–36.0)
MCV: 86 fL (ref 80.0–100.0)
Platelets: 155 10*3/uL (ref 150–400)
RBC: 4.8 MIL/uL (ref 4.22–5.81)
RDW: 22 % — ABNORMAL HIGH (ref 11.5–15.5)
WBC: 13.7 10*3/uL — ABNORMAL HIGH (ref 4.0–10.5)
nRBC: 0 % (ref 0.0–0.2)

## 2021-06-28 LAB — MAGNESIUM: Magnesium: 2 mg/dL (ref 1.7–2.4)

## 2021-06-28 LAB — BASIC METABOLIC PANEL
Anion gap: 5 (ref 5–15)
BUN: 15 mg/dL (ref 6–20)
CO2: 23 mmol/L (ref 22–32)
Calcium: 8.4 mg/dL — ABNORMAL LOW (ref 8.9–10.3)
Chloride: 107 mmol/L (ref 98–111)
Creatinine, Ser: 1.22 mg/dL (ref 0.61–1.24)
GFR, Estimated: >60 mL/min (ref >60)
Glucose, Bld: 140 mg/dL — ABNORMAL HIGH (ref 70–99)
Potassium: 4.4 mmol/L (ref 3.5–5.1)
Sodium: 135 mmol/L (ref 135–145)

## 2021-06-28 LAB — PHOSPHORUS: Phosphorus: 3.7 mg/dL (ref 2.5–4.6)

## 2021-06-28 LAB — GLUCOSE, CAPILLARY
Glucose-Capillary: 161 mg/dL — ABNORMAL HIGH (ref 70–99)
Glucose-Capillary: 137 mg/dL — ABNORMAL HIGH (ref 70–99)
Glucose-Capillary: 137 mg/dL — ABNORMAL HIGH (ref 70–99)
Glucose-Capillary: 115 mg/dL — ABNORMAL HIGH (ref 70–99)

## 2021-06-28 MED ORDER — SODIUM CHLORIDE 0.9 % IV SOLN: INTRAVENOUS | Status: DC

## 2021-06-28 MED ORDER — PANTOPRAZOLE SODIUM 40 MG PO TBEC
40.0000 mg | DELAYED_RELEASE_TABLET | Freq: Two times a day (BID) | ORAL | Status: DC
  Administered 2021-06-28 – 2021-06-30 (×4): 40 mg via ORAL
  Filled 2021-06-28 (×4): qty 1

## 2021-06-28 MED ORDER — SODIUM CHLORIDE 0.9 % IV BOLUS
1000.0000 mL | Freq: Once | INTRAVENOUS | Status: AC
  Administered 2021-06-28: 1000 mL via INTRAVENOUS

## 2021-06-28 MED ORDER — BUDESONIDE 0.25 MG/2ML IN SUSP: 0.2500 mg | Freq: Two times a day (BID) | RESPIRATORY_TRACT | Status: DC | PRN

## 2021-06-28 MED ORDER — SODIUM CHLORIDE 0.9 % IV BOLUS: 1000.0000 mL | Freq: Once | INTRAVENOUS | Status: DC

## 2021-06-28 NOTE — Progress Notes (Signed)
[facility] ?SURGICAL PROGRESS NOTE ? ?Hospital Day(s): 1.  ? ?Post op day(s): 1 Day Post-Op.  ? ?Interval History:  ?Patient seen and examined ?No acute events or new complaints overnight.  ?Patient this morning was doing well; asking for diet advancement ?Unfortunately, more bloated this afternoon concerning for ileus ?No fever, chills nausea, emesis ?He has slight leukocytosis this morning; 13.7K; likely reactive from OR ?Renal function normal; sCr - 1.22 ?No electrolyte derangements  ?No flatus  ? ?Vital signs in last 24 hours: [min-max] current  ?Temp:  [97.3 ?F (36.3 ?C)-98.2 ?F (36.8 ?C)] 98.2 ?F (36.8 ?C) (04/19 0759) ?Pulse Rate:  [50-67] 67 (04/19 0759) ?Resp:  [8-20] 18 (04/19 0759) ?BP: (103-131)/(64-79) 131/64 (04/19 0759) ?SpO2:  [90 %-98 %] 97 % (04/19 0759)     Height: 6' (182.9 cm) Weight: 106.6 kg BMI (Calculated): 31.86  ? ?Intake/Output last 2 shifts:  ?04/18 0701 - 04/19 0700 ?In: 1468.7 [P.O.:778; I.V.:390.7; IV Piggyback:300] ?Out: 5 [Blood:5]  ? ?Physical Exam:  ?Constitutional: alert, cooperative and no distress  ?Respiratory: breathing non-labored at rest  ?Cardiovascular: regular rate and sinus rhythm  ?Gastrointestinal: Soft, non-tender, he is distended, no rebound/guarding ?Integumentary: Previous ileostomy site is healing well, staples and wicks present, honeycomb in place  ? ?Labs:  ? ?  Latest Ref Rng & Units 06/28/2021  ?  5:37 AM 06/27/2021  ?  1:53 PM 06/27/2021  ?  7:44 AM  ?CBC  ?WBC 4.0 - 10.5 K/uL 13.7   8.5   6.6    ?Hemoglobin 13.0 - 17.0 g/dL 13.5   14.1   15.2    ?Hematocrit 39.0 - 52.0 % 41.3   43.8   46.9    ?Platelets 150 - 400 K/uL 155   142   146    ? ? ?  Latest Ref Rng & Units 06/28/2021  ?  5:37 AM 06/27/2021  ?  1:53 PM 06/20/2021  ?  9:25 AM  ?CMP  ?Glucose 70 - 99 mg/dL 140    134    ?BUN 6 - 20 mg/dL 15    9    ?Creatinine 0.61 - 1.24 mg/dL 1.22   1.34   1.08    ?Sodium 135 - 145 mmol/L 135    132    ?Potassium 3.5 - 5.1 mmol/L 4.4    4.1    ?Chloride  98 - 111 mmol/L 107    101    ?CO2 22 - 32 mmol/L 23    24    ?Calcium 8.9 - 10.3 mg/dL 8.4    8.7    ?Total Protein 6.5 - 8.1 g/dL   7.4    ?Total Bilirubin 0.3 - 1.2 mg/dL   0.6    ?Alkaline Phos 38 - 126 U/L   98    ?AST 15 - 41 U/L   32    ?ALT 0 - 44 U/L   35    ? ? ? ?Imaging studies: No new pertinent imaging studies ? ? ?Assessment/Plan:  ?56 y.o. male with likely developing post-operative ileus 1 Day Post-Op s/p ileostomy takedown ? ? - Back down to [facility] for now ?- restarted IVF ?- No need for NGT yet; will follow  ? - Monitor abdominal examination; on-going bowel function ?- Pain control prn (minimize narcotics as feasible); antiemetics prn  ? - Serial KUBs as needed  ? - Mobilization encouraged ? ?- Discharge Planning: Now with possible ileus, will keep today, take things day to day  ? ?  All of the above findings and recommendations were discussed with the patient, patient's family, and the medical team, and all of patient's and family's questions were answered to their expressed satisfaction. ? ?-- ?Edison Simon, PA-C ?[facility] ?06/28/2021, 12:45 PM ?M-F: 7am - 4pm  ?

## 2021-06-28 NOTE — Progress Notes (Signed)
PHARMACIST - PHYSICIAN COMMUNICATION ? ?CONCERNING: IV to Oral Route Change Policy ? ?RECOMMENDATION: ?This patient is receiving pantoprazole by the intravenous route.  Based on criteria approved by the Pharmacy and Therapeutics Committee, the intravenous medication(s) is/are being converted to the equivalent oral dose form(s). ? ? ?DESCRIPTION: ?These criteria include: ?The patient is eating (either orally or via tube) and/or has been taking other orally administered medications for a least 24 hours ?The patient has no evidence of active gastrointestinal bleeding or impaired GI absorption (gastrectomy, short bowel, patient on TNA or NPO). ? ?If you have questions about this conversion, please contact the Pharmacy Department  ? ?Benita Gutter, RPH ?06/28/2021 8:08 AM  ?

## 2021-06-28 NOTE — Consult Note (Addendum)
? ?  [facility] CM Inpatient Consult ? ? ?06/28/2021 ? ?[name] ?1965/11/26 ?[id number] ? ? [place] Patient: Tracy Gardner ? ?Primary Care Provider:  [name], PA-C ? ?Made aware of patient for post hospital follow up needs.  ?Addendum:  admitted yesterday for a ileostomy takedown, hx of colon CA. ? ?Patient has been currently assigned to a [place] Management for telephonic chronic disease management services.  ?   ? Plan: Patient will be followed by [place] Coordinator. ?  ?For additional questions or referrals please contact: ?  ?Natividad Brood, RN BSN CCM ?[facility] Liaison ? [phone] business mobile phone ?Toll free office [phone]  ?Fax number: [phone] ?[email or web address] ? ? ?

## 2021-06-28 NOTE — Progress Notes (Signed)
Per patient, he has only urinated 157m the whole day. ?Awaiting orders from Dr. RChristian Mate ?CPhoebe SharpsN ? ?

## 2021-06-28 NOTE — Progress Notes (Signed)
Pt reportedly with one kidney, spouse present and insisting he's not voided sufficiently today.  Abdomen reportedly distended and with 250 mL NG output.   ?BP and HR good.  ?Will have Foley placed for strict I/O monitoring, and will bolus additional liter over 4 hours and increase his maintence IVF to 125 mLhr.   ?Repeat labs in AM.  ?

## 2021-06-29 ENCOUNTER — Inpatient Hospital Stay: Payer: 59

## 2021-06-29 LAB — BASIC METABOLIC PANEL
Anion gap: 6 (ref 5–15)
BUN: 13 mg/dL (ref 6–20)
CO2: 22 mmol/L (ref 22–32)
Calcium: 8.5 mg/dL — ABNORMAL LOW (ref 8.9–10.3)
Chloride: 107 mmol/L (ref 98–111)
Creatinine, Ser: 1.01 mg/dL (ref 0.61–1.24)
GFR, Estimated: >60 mL/min (ref >60)
Glucose, Bld: 138 mg/dL — ABNORMAL HIGH (ref 70–99)
Potassium: 4.2 mmol/L (ref 3.5–5.1)
Sodium: 135 mmol/L (ref 135–145)

## 2021-06-29 LAB — CBC
HCT: 47 % (ref 39.0–52.0)
Hemoglobin: 15.1 g/dL (ref 13.0–17.0)
MCH: 28.2 pg (ref 26.0–34.0)
MCHC: 32.1 g/dL (ref 30.0–36.0)
MCV: 87.7 fL (ref 80.0–100.0)
Platelets: 156 10*3/uL (ref 150–400)
RBC: 5.36 MIL/uL (ref 4.22–5.81)
RDW: 22.6 % — ABNORMAL HIGH (ref 11.5–15.5)
WBC: 13.4 10*3/uL — ABNORMAL HIGH (ref 4.0–10.5)
nRBC: 0 % (ref 0.0–0.2)

## 2021-06-29 LAB — GLUCOSE, CAPILLARY
Glucose-Capillary: 133 mg/dL — ABNORMAL HIGH (ref 70–99)
Glucose-Capillary: 124 mg/dL — ABNORMAL HIGH (ref 70–99)
Glucose-Capillary: 99 mg/dL (ref 70–99)
Glucose-Capillary: 106 mg/dL — ABNORMAL HIGH (ref 70–99)

## 2021-06-29 LAB — MAGNESIUM: Magnesium: 2 mg/dL (ref 1.7–2.4)

## 2021-06-29 LAB — PHOSPHORUS: Phosphorus: 2.8 mg/dL (ref 2.5–4.6)

## 2021-06-29 MED ORDER — SODIUM CHLORIDE 0.9 % IV BOLUS
1000.0000 mL | Freq: Once | INTRAVENOUS | Status: AC
  Administered 2021-06-29: 1000 mL via INTRAVENOUS

## 2021-06-29 MED ORDER — HYDROMORPHONE HCL 1 MG/ML IJ SOLN
1.0000 mg | INTRAMUSCULAR | Status: DC | PRN
  Administered 2021-06-29 (×3): 1 mg via INTRAVENOUS
  Filled 2021-06-29 (×3): qty 1

## 2021-06-29 NOTE — Progress Notes (Signed)
Patient with abdominal distention and increased pain.  Morphine was not giving relief so Dr. Christian Mate order a CT scan and IV dilaudid for pain relief.  Soap suds enema was given with very little output.  Another bolus of normal saline was given to increase urine output. Dilaudid is relieving patient's pain and he is currently resting. Wife at bedside.  Will continue to monitor urine and NG tube output.   ?Tracy Gardner N ? ?

## 2021-06-29 NOTE — Progress Notes (Signed)
[facility] ?SURGICAL PROGRESS NOTE ? ?Hospital Day(s): 2.  ? ?Post op day(s): 2 Days Post-Op.  ? ?Interval History:  ?Patient seen and examined ?Overnight, he continued to have issues with distension and pain. He underwent CT Abdomen/Pelvis which showed significant stomach ad small bowel dilation with transition in ileum, no free air, no abscess. NGT was placed and output 500 ccs overnight. Soap Suds enema was also given with minimal relief.  ? ?This morning, he reports that he feels slightly better but still markedly distended ?No fever, chills, nausea, emesis ?He continues to have stable leukocytosis; 134K ?Hgb normal at 15.1 ?Renal function normal; sCr - 1.01; UO - 875 ccs ?No electrolyte derangements ?NGT without significant further output at time of my evaluation ? ? ?Vital signs in last 24 hours: [min-max] current  ?Temp:  [97.4 ?F (36.3 ?C)-98.2 ?F (36.8 ?C)] 97.4 ?F (36.3 ?C) (04/20 0348) ?Pulse Rate:  [59-68] 61 (04/20 0348) ?Resp:  [16-20] 16 (04/20 0348) ?BP: (124-133)/(64-79) 125/64 (04/20 0348) ?SpO2:  [93 %-97 %] 95 % (04/20 0348)     Height: 6' (182.9 cm) Weight: 106.6 kg BMI (Calculated): 31.86  ? ?Intake/Output last 2 shifts:  ?04/19 0701 - 04/20 0700 ?In: 1620 [P.O.:1620] ?Out: 4656 [Urine:875; Emesis/NG output:500]  ? ?Physical Exam:  ?Constitutional: alert, cooperative and no distress  ?HEENT: NGT in place; minimal output ?Respiratory: breathing non-labored at rest  ?Cardiovascular: regular rate and sinus rhythm  ?Gastrointestinal: Abdomen is markedly distended and tympanic, incisional tenderness, no rebound/guarding. He is certainly not peritonitic ?Integumentary: Previous ileostomy site is healing well, staples and wicks present, honeycomb in place  ? ?Labs:  ? ?  Latest Ref Rng & Units 06/29/2021  ?  3:50 AM 06/28/2021  ?  5:37 AM 06/27/2021  ?  1:53 PM  ?CBC  ?WBC 4.0 - 10.5 K/uL 13.4   13.7   8.5    ?Hemoglobin 13.0 - 17.0 g/dL 15.1   13.5   14.1    ?Hematocrit 39.0 - 52.0 %  47.0   41.3   43.8    ?Platelets 150 - 400 K/uL 156   155   142    ? ? ?  Latest Ref Rng & Units 06/29/2021  ?  3:50 AM 06/28/2021  ?  5:37 AM 06/27/2021  ?  1:53 PM  ?CMP  ?Glucose 70 - 99 mg/dL 138   140     ?BUN 6 - 20 mg/dL 13   15     ?Creatinine 0.61 - 1.24 mg/dL 1.01   1.22   1.34    ?Sodium 135 - 145 mmol/L 135   135     ?Potassium 3.5 - 5.1 mmol/L 4.2   4.4     ?Chloride 98 - 111 mmol/L 107   107     ?CO2 22 - 32 mmol/L 22   23     ?Calcium 8.9 - 10.3 mg/dL 8.5   8.4     ? ? ? ?Imaging studies:  ? ?CT Abdomen/Pelvis (06/29/2021) personally reviewed which shows significant distension in the stomach, small bowel dilation, transition in the ileum (likely secondary to edema), no free air, no abscess, and radiologist report reviewed:  ?IMPRESSION: ?Small-bowel obstruction with transition in the right lateral ?abdomen. ? ? ?KUB (06/29/2021) personally reviewed with persistent small bowel dilation, stomach seems decompressed, no gross pneumoperitoneum, still with previous contrast in distal colon, and radiologist report reviewed below:  ?IMPRESSION: ?Stable small bowel dilatation is noted concerning for distal small ?bowel obstruction  or ileus. No acute cardiopulmonary disease. ? ? ?Assessment/Plan: ?56 y.o. male with post-operative ileus most likely secondary to edema at ileostomy takedown site, no gross abscess or free air appreciable, 2 Days Post-Op s/p ileostomy takedown ? ? - Continue NGT decompression; LIS; monitor and record output ?- No indication for surgical re-intervention at this time; follow closely ?- NPO + Aggressive IVF resuscitation  ? - Monitor abdominal examination; on-going bowel function ? - Serial KUBs ?- Pain control prn (Minimize narcotics as feasible); antiemetics prn  ? - Monitor renal function; normalized; good UO this morning  ? - Monitor leukocytosis; stable ?- Mobilization encouraged ? ?All of the above findings and recommendations were discussed with the patient, and the medical team,  and all of patient's and family's questions were answered to his expressed satisfaction. ? ?-- ?Edison Simon, PA-C ?[facility] ?06/29/2021, 7:39 AM ?M-F: 7am - 4pm ? ?

## 2021-06-29 NOTE — Progress Notes (Signed)
Pain improved with Dilaudid.  Not vomiting.  NGT with minimal function despite large volume in stomach on CT.  ?SBO appears to be proximal to distal ileum, and so I don't believe the barium is provoking the obstructive Sx.   ?BM yesterday was described as a sandy color.  Some more with enema.  ?I attempted to get NGT to better drainage, but despite being 18 Fr. No improvement.  Will attempt to replace with larger caliber tube.  ?Will continue aggressive hydration to improve Uo.  ? ?

## 2021-06-29 NOTE — Progress Notes (Signed)
Now with progressive abdominal distention and uncontrolled pain.   ?KUB from earlier reviewed and I see contrast in the right colon (from March 3) and dilated loops of SB.   Suspect barium concretion as source of distention and pain. Will attempt enemas, and pain control.  ?

## 2021-06-30 ENCOUNTER — Inpatient Hospital Stay: Payer: 59

## 2021-06-30 ENCOUNTER — Inpatient Hospital Stay: Payer: Self-pay

## 2021-06-30 LAB — COMPREHENSIVE METABOLIC PANEL
ALT: 19 U/L (ref 0–44)
AST: 16 U/L (ref 15–41)
Albumin: 3.3 g/dL — ABNORMAL LOW (ref 3.5–5.0)
Alkaline Phosphatase: 62 U/L (ref 38–126)
Anion gap: 8 (ref 5–15)
BUN: 13 mg/dL (ref 6–20)
CO2: 22 mmol/L (ref 22–32)
Calcium: 8.3 mg/dL — ABNORMAL LOW (ref 8.9–10.3)
Chloride: 106 mmol/L (ref 98–111)
Creatinine, Ser: 1 mg/dL (ref 0.61–1.24)
GFR, Estimated: >60 mL/min (ref >60)
Glucose, Bld: 112 mg/dL — ABNORMAL HIGH (ref 70–99)
Potassium: 4 mmol/L (ref 3.5–5.1)
Sodium: 136 mmol/L (ref 135–145)
Total Bilirubin: 1 mg/dL (ref 0.3–1.2)
Total Protein: 6.7 g/dL (ref 6.5–8.1)

## 2021-06-30 LAB — GLUCOSE, CAPILLARY
Glucose-Capillary: 109 mg/dL — ABNORMAL HIGH (ref 70–99)
Glucose-Capillary: 101 mg/dL — ABNORMAL HIGH (ref 70–99)
Glucose-Capillary: 131 mg/dL — ABNORMAL HIGH (ref 70–99)
Glucose-Capillary: 102 mg/dL — ABNORMAL HIGH (ref 70–99)

## 2021-06-30 LAB — CBC
HCT: 43.2 % (ref 39.0–52.0)
Hemoglobin: 14.2 g/dL (ref 13.0–17.0)
MCH: 28.7 pg (ref 26.0–34.0)
MCHC: 32.9 g/dL (ref 30.0–36.0)
MCV: 87.3 fL (ref 80.0–100.0)
Platelets: 140 10*3/uL — ABNORMAL LOW (ref 150–400)
RBC: 4.95 MIL/uL (ref 4.22–5.81)
RDW: 22.1 % — ABNORMAL HIGH (ref 11.5–15.5)
WBC: 8.5 10*3/uL (ref 4.0–10.5)
nRBC: 0 % (ref 0.0–0.2)

## 2021-06-30 MED ORDER — CLINIMIX E/DEXTROSE (5/20) 5 % IV SOLN
INTRAVENOUS | Status: DC
  Filled 2021-06-30: qty 2000

## 2021-06-30 MED ORDER — PANTOPRAZOLE SODIUM 40 MG IV SOLR
40.0000 mg | Freq: Two times a day (BID) | INTRAVENOUS | Status: DC
  Administered 2021-06-30 – 2021-07-04 (×9): 40 mg via INTRAVENOUS
  Filled 2021-06-30 (×9): qty 10

## 2021-06-30 MED ORDER — KETOROLAC TROMETHAMINE 15 MG/ML IJ SOLN
15.0000 mg | Freq: Four times a day (QID) | INTRAMUSCULAR | Status: DC
  Administered 2021-06-30 – 2021-07-03 (×12): 15 mg via INTRAVENOUS
  Filled 2021-06-30 (×12): qty 1

## 2021-06-30 MED ORDER — KETOROLAC TROMETHAMINE 15 MG/ML IJ SOLN: 15.0000 mg | Freq: Four times a day (QID) | INTRAMUSCULAR | Status: DC

## 2021-06-30 MED ORDER — ACETAMINOPHEN 10 MG/ML IV SOLN
1000.0000 mg | Freq: Four times a day (QID) | INTRAVENOUS | Status: DC
  Filled 2021-06-30 (×2): qty 100

## 2021-06-30 MED ORDER — CLINIMIX/DEXTROSE (5/20) 5 % IV SOLN
INTRAVENOUS | Status: AC
  Filled 2021-06-30: qty 2000

## 2021-06-30 MED ORDER — DEXTROSE-NACL 5-0.9 % IV SOLN: INTRAVENOUS | Status: AC

## 2021-06-30 MED ORDER — SODIUM CHLORIDE 0.9% FLUSH
10.0000 mL | Freq: Two times a day (BID) | INTRAVENOUS | Status: DC
  Administered 2021-06-30 – 2021-07-03 (×6): 10 mL
  Administered 2021-07-04: 40 mL

## 2021-06-30 MED ORDER — SODIUM CHLORIDE 0.9% FLUSH: 10.0000 mL | INTRAVENOUS | Status: DC | PRN

## 2021-06-30 MED ORDER — DEXTROSE 5 % IV SOLN: INTRAVENOUS | Status: DC

## 2021-06-30 MED ORDER — INSULIN ASPART 100 UNIT/ML IJ SOLN
0.0000 [IU] | Freq: Four times a day (QID) | INTRAMUSCULAR | Status: DC
  Administered 2021-06-30 – 2021-07-01 (×3): 3 [IU] via SUBCUTANEOUS
  Administered 2021-07-01: 4 [IU] via SUBCUTANEOUS
  Administered 2021-07-02 – 2021-07-04 (×2): 3 [IU] via SUBCUTANEOUS
  Filled 2021-06-30 (×5): qty 1

## 2021-06-30 MED ORDER — ACETAMINOPHEN 10 MG/ML IV SOLN
1000.0000 mg | Freq: Four times a day (QID) | INTRAVENOUS | Status: AC
  Administered 2021-06-30 – 2021-07-01 (×3): 1000 mg via INTRAVENOUS
  Filled 2021-06-30 (×4): qty 100

## 2021-06-30 NOTE — Progress Notes (Signed)
PHARMACY - TOTAL PARENTERAL NUTRITION CONSULT NOTE  ? ?Indication: Prolonged ileus ? ?Patient Measurements: ?Height: 6' (182.9 cm) ?Weight: 106.6 kg (235 lb) ?IBW/kg (Calculated) : 77.6 ?TPN AdjBW (KG): 84.8 ?Body mass index is 31.87 kg/m?. ? ?Assessment:  55 y.o.  male w/ PMH of HTN, CKD, HFrEF, T2DM, Afib, HLD, adenocarcinoma of colonstage III colon cancer is currently on adjuvant FOLFOX with prolonged ileus ? ?Glucose / Insulin: BG previous 24h 99 - 133 (12 units SSI required) ?Electrolytes: WNL ?Renal: stable and at apparent baseline ?Hepatic: LFTs wnl ?MIVF: dextrose 5 %-0.9 % sodium chloride infusion at 125 mL/hr ?GI Imaging: ?KUB (06/30/2021) personally reviewed with persistent small bowel dilation, stomach seems decompressed, no gross pneumoperitoneum, and radiologist report reviewed below:  ?IMPRESSION: ?1. Similar dilated small bowel loops, concerning for obstruction ?given findings on recent CT. ?2. Relative lucency in the left hemiabdomen on supine radiographs ?without definite evidence of free air on the chest radiograph. This ?finding could potentially be related to bowel loops that are not ?well visualized, but pneumoperitoneum is difficult to exclude. A ?lateral decubitus x-ray or CT could further evaluate if there is ?clinical concern. ?GI Surgeries / Procedures: s/p ileostomy takedown 06/27/21 ? ?Central access: 06/30/21 (ordered/pending) ?TPN start date: 06/30/21 (pending) ? ?Nutritional Goals: pending RD assessment ? ?RD Assessment: pending ?  ?Current Nutrition:  ?NPO ? ?Plan:  ?Start Clinimix 5/20 TPN at 35m/hr at 1800 (customized TPN unable to be ordered since compounder had been disassembled) ?Initiate Resistant q6h SSI and adjust as needed  ?Reduce MIVF to 85 mL/hr at 1800 ?Monitor TPN labs on Mon/Thurs, daily until stable ? ?RDallie Piles?06/30/2021,2:32 PM ? ?
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
## 2021-06-30 NOTE — Progress Notes (Signed)
Peripherally Inserted Central Catheter Placement ? ?The IV Nurse has discussed with the patient and/or persons authorized to consent for the patient, the purpose of this procedure and the potential benefits and risks involved with this procedure.  The benefits include less needle sticks, lab draws from the catheter, and the patient may be discharged home with the catheter. Risks include, but not limited to, infection, bleeding, blood clot (thrombus formation), and puncture of an artery; nerve damage and irregular heartbeat and possibility to perform a PICC exchange if needed/ordered by physician.  Alternatives to this procedure were also discussed.  Bard Power PICC patient education guide, fact sheet on infection prevention and patient information card has been provided to patient /or left at bedside.   ? ?PICC Placement Documentation  ?PICC Double Lumen 06/30/21 Left Brachial 48 cm 2 cm (Active)  ?Indication for Insertion or Continuance of Line Administration of hyperosmolar/irritating solutions (i.e. TPN, Vancomycin, etc.) 06/30/21 1755  ?Exposed Catheter (cm) 2 cm 06/30/21 1755  ?Site Assessment Clean, Dry, Intact 06/30/21 1755  ?Lumen #1 Status Flushed;Saline locked;Blood return noted 06/30/21 1755  ?Lumen #2 Status Flushed;Saline locked;Blood return noted 06/30/21 1755  ?Dressing Type Securing device;Transparent 06/30/21 1755  ?Dressing Status Antimicrobial disc in place 06/30/21 1755  ?Safety Lock Not Applicable 97/67/34 1937  ?Dressing Change Due 07/07/21 06/30/21 1755  ? ? ? ? ? ?Frances Maywood ?06/30/2021, 5:58 PM ? ?

## 2021-06-30 NOTE — Progress Notes (Addendum)
[facility] ?SURGICAL PROGRESS NOTE ? ?Hospital Day(s): 3.  ? ?Post op day(s): 3 Days Post-Op.  ? ?Interval History:  ?Patient seen and examined ?No issues overnight ?This morning, he reports that abdomen is softer ?No fever, chills, nausea, emesis ?Leukocytosis is resolved; now 8.5K ?Renal function normal; sCr - 1.00; UO - 850 ccs + unmeasured amounts ?No electrolyte derangements ?NGT put out 1200 ccs overnight --> NG was connected to blue venting port  ?No flatus ? ?Vital signs in last 24 hours: [min-max] current  ?Temp:  [97.5 ?F (36.4 ?C)-98.7 ?F (37.1 ?C)] 98 ?F (36.7 ?C) (04/21 0459) ?Pulse Rate:  [61-73] 73 (04/21 0459) ?Resp:  [18-20] 18 (04/21 0459) ?BP: (124-141)/(78-88) 141/78 (04/21 0459) ?SpO2:  [95 %-97 %] 96 % (04/21 0459)     Height: 6' (182.9 cm) Weight: 106.6 kg BMI (Calculated): 31.86  ? ?Intake/Output last 2 shifts:  ?04/20 0701 - 04/21 0700 ?In: 1000 [I.V.:1000] ?Out: 2150 [Urine:850; Emesis/NG output:1300]  ? ?Physical Exam:  ?Constitutional: alert, cooperative and no distress  ?HEENT: NGT in place; output increased overnight however tubing was connected to blue venting prot. I was able to flush the venting port with air and the tubing with NS. Reconnected suction to the actual tubing with good return of fluid and whistling from venting port ?Respiratory: breathing non-labored at rest  ?Cardiovascular: regular rate and sinus rhythm  ?Gastrointestinal: Abdomen remains markedly distended and tympanic but I do feel this is improved some from yesterday's examination, incisional tenderness, no rebound/guarding. He is certainly not peritonitic ?Integumentary: Previous ileostomy site is healing well, staples and wicks present, honeycomb in place  ? ?Labs:  ? ?  Latest Ref Rng & Units 06/30/2021  ?  6:17 AM 06/29/2021  ?  3:50 AM 06/28/2021  ?  5:37 AM  ?CBC  ?WBC 4.0 - 10.5 K/uL 8.5   13.4   13.7    ?Hemoglobin 13.0 - 17.0 g/dL 14.2   15.1   13.5    ?Hematocrit 39.0 - 52.0 % 43.2   47.0    41.3    ?Platelets 150 - 400 K/uL 140   156   155    ? ? ?  Latest Ref Rng & Units 06/30/2021  ?  6:17 AM 06/29/2021  ?  3:50 AM 06/28/2021  ?  5:37 AM  ?CMP  ?Glucose 70 - 99 mg/dL 112   138   140    ?BUN 6 - 20 mg/dL '13   13   15    '$ ?Creatinine 0.61 - 1.24 mg/dL 1.00   1.01   1.22    ?Sodium 135 - 145 mmol/L 136   135   135    ?Potassium 3.5 - 5.1 mmol/L 4.0   4.2   4.4    ?Chloride 98 - 111 mmol/L 106   107   107    ?CO2 22 - 32 mmol/L '22   22   23    '$ ?Calcium 8.9 - 10.3 mg/dL 8.3   8.5   8.4    ?Total Protein 6.5 - 8.1 g/dL 6.7      ?Total Bilirubin 0.3 - 1.2 mg/dL 1.0      ?Alkaline Phos 38 - 126 U/L 62      ?AST 15 - 41 U/L 16      ?ALT 0 - 44 U/L 19      ? ? ? ?Imaging studies:  ? ?KUB (06/30/2021) personally reviewed with persistent small bowel dilation, stomach seems decompressed,  no gross pneumoperitoneum, and radiologist report reviewed below:  ?IMPRESSION: ?1. Similar dilated small bowel loops, concerning for obstruction ?given findings on recent CT. ?2. Relative lucency in the left hemiabdomen on supine radiographs ?without definite evidence of free air on the chest radiograph. This ?finding could potentially be related to bowel loops that are not ?well visualized, but pneumoperitoneum is difficult to exclude. A ?lateral decubitus x-ray or CT could further evaluate if there is ?clinical concern. ? ? ?Assessment/Plan: ?56 y.o. male with post-operative ileus most likely secondary to edema at ileostomy takedown site, no gross abscess or free air appreciable, 3 Days Post-Op s/p ileostomy takedown ? ? - No indication for surgical re-intervention at this time; follow closely. In regards to KUB report this morning.Marland KitchenMarland KitchenI have a very low clinical suspicion for pneumoperitoneum at this time. Aside from distension (which is improved), his abdominal examination is reassuring, and he is without gross evidence of peritonitis. In addition, his leukocytosis has resolved, his renal function remains normal, and he has been  hemodynamically stable. We will continue to follow closely. Should he deteriorate in any fashion, would have a low threshold to repeat CT.  ? ? - Continue NGT decompression; LIS; monitor and record output ?- NPO + Aggressive IVF resuscitation; I did discuss potential for starting TPN if he fails to improve. Now that NG is working well and he is getting clinical improvement, I think we can hold off for now, but have low threshold to start over the weekend. I did add nutritional labs for tomorrow morning  ? - Monitor abdominal examination; on-going bowel function ? - Discontinue foley ? - Serial KUBs; ordered for AM ?- Pain control prn (Minimize narcotics as feasible); antiemetics prn  ? - Monitor renal function; normalized; good UO this morning  ? - Monitor leukocytosis; resolved ?- Mobilization encouraged; needs to continue to get OOB ? ?All of the above findings and recommendations were discussed with the patient, and the medical team, and all of patient's and family's questions were answered to his expressed satisfaction. ? ?-- ?Edison Simon, PA-C ?[facility] ?06/30/2021, 7:20 AM ?M-F: 7am - 4pm ? ?

## 2021-06-30 NOTE — Progress Notes (Signed)
Pt seen and examined today, Earlier today  I removed the wicks from the wound, he did bleed from the incision. RN  called and placed pressure dressing, I examined the pt and there was blood in  the dressing. I placed pressure for 5-7 minutes and the bleeding stopped. A new dressing was applied. THe NGT was flushed and is working well. ?ABd: is soft and there is no peritonitis. ? ?I am trying to get Picc line for today. Called the AD. We will have to start TPN tonight with generic formula a low rate. I appreciate Mr. Tracy Gardner help with facilitating TPN for today ?I also update the wife in detail about events. ?

## 2021-07-01 ENCOUNTER — Inpatient Hospital Stay: Payer: 59

## 2021-07-01 LAB — GLUCOSE, CAPILLARY
Glucose-Capillary: 131 mg/dL — ABNORMAL HIGH (ref 70–99)
Glucose-Capillary: 200 mg/dL — ABNORMAL HIGH (ref 70–99)
Glucose-Capillary: 130 mg/dL — ABNORMAL HIGH (ref 70–99)
Glucose-Capillary: 98 mg/dL (ref 70–99)

## 2021-07-01 LAB — COMPREHENSIVE METABOLIC PANEL
ALT: 17 U/L (ref 0–44)
AST: 15 U/L (ref 15–41)
Albumin: 3 g/dL — ABNORMAL LOW (ref 3.5–5.0)
Alkaline Phosphatase: 49 U/L (ref 38–126)
Anion gap: 4 — ABNORMAL LOW (ref 5–15)
BUN: 14 mg/dL (ref 6–20)
CO2: 24 mmol/L (ref 22–32)
Calcium: 7.9 mg/dL — ABNORMAL LOW (ref 8.9–10.3)
Chloride: 108 mmol/L (ref 98–111)
Creatinine, Ser: 0.9 mg/dL (ref 0.61–1.24)
GFR, Estimated: >60 mL/min (ref >60)
Glucose, Bld: 114 mg/dL — ABNORMAL HIGH (ref 70–99)
Potassium: 3.6 mmol/L (ref 3.5–5.1)
Sodium: 136 mmol/L (ref 135–145)
Total Bilirubin: 1 mg/dL (ref 0.3–1.2)
Total Protein: 6.1 g/dL — ABNORMAL LOW (ref 6.5–8.1)

## 2021-07-01 LAB — CBC
HCT: 37.9 % — ABNORMAL LOW (ref 39.0–52.0)
Hemoglobin: 12.4 g/dL — ABNORMAL LOW (ref 13.0–17.0)
MCH: 28.8 pg (ref 26.0–34.0)
MCHC: 32.7 g/dL (ref 30.0–36.0)
MCV: 87.9 fL (ref 80.0–100.0)
Platelets: 129 10*3/uL — ABNORMAL LOW (ref 150–400)
RBC: 4.31 MIL/uL (ref 4.22–5.81)
RDW: 21.2 % — ABNORMAL HIGH (ref 11.5–15.5)
WBC: 7.8 10*3/uL (ref 4.0–10.5)
nRBC: 0 % (ref 0.0–0.2)

## 2021-07-01 LAB — MAGNESIUM: Magnesium: 1.8 mg/dL (ref 1.7–2.4)

## 2021-07-01 LAB — PHOSPHORUS: Phosphorus: 1.9 mg/dL — ABNORMAL LOW (ref 2.5–4.6)

## 2021-07-01 LAB — HEMOGLOBIN AND HEMATOCRIT, BLOOD
HCT: 39.5 % (ref 39.0–52.0)
Hemoglobin: 12.5 g/dL — ABNORMAL LOW (ref 13.0–17.0)

## 2021-07-01 MED ORDER — DEXTROSE-NACL 5-0.9 % IV SOLN: INTRAVENOUS | Status: AC

## 2021-07-01 MED ORDER — TRAVASOL 10 % IV SOLN
INTRAVENOUS | Status: AC
  Filled 2021-07-01: qty 396

## 2021-07-01 MED ORDER — DEXTROSE 5 % IV SOLN
20.0000 mmol | Freq: Once | INTRAVENOUS | Status: AC
  Administered 2021-07-01: 20 mmol via INTRAVENOUS
  Filled 2021-07-01: qty 6.67

## 2021-07-01 NOTE — Progress Notes (Addendum)
Subjective:  ?CC: ?Tracy Gardner is a 56 y.o. male  Hospital stay day 4, 4 Days Post-Op s/p ileostomy takedown, now with post-operative ileus most likely secondary to edema at ileostomy takedown site, ?  ? ?HPI: ?No acute issues overnight.  No report of flatus this or bowel movement.  Pain well controlled.  No further bleeding episodes from the former ileostomy site. ? ?ROS:  ?General: Denies weight loss, weight gain, fatigue, fevers, chills, and night sweats. ?Heart: Denies chest pain, palpitations, racing heart, irregular heartbeat, leg pain or swelling, and decreased activity tolerance. ?Respiratory: Denies breathing difficulty, shortness of breath, wheezing, cough, and sputum. ?GI: Denies change in appetite, heartburn, nausea, vomiting, constipation, diarrhea, and blood in stool. ?GU: Denies difficulty urinating, pain with urinating, urgency, frequency, blood in urine. ? ? ?Objective:  ? ?Temp:  [97.4 ?F (36.3 ?C)-98 ?F (36.7 ?C)] 97.4 ?F (36.3 ?C) (04/22 0753) ?Pulse Rate:  [60-73] 60 (04/22 0753) ?Resp:  [18-20] 18 (04/22 0753) ?BP: (121-132)/(70-78) 121/75 (04/22 0753) ?SpO2:  [97 %] 97 % (04/22 0753)     Height: 6' (182.9 cm) Weight: 106.6 kg BMI (Calculated): 31.86  ? ?Intake/Output this shift:  ? ?Intake/Output Summary (Last 24 hours) at 07/01/2021 1202 ?Last data filed at 07/01/2021 0543 ?Gross per 24 hour  ?Intake 2450.37 ml  ?Output 2050 ml  ?Net 400.37 ml  ? ? ?Constitutional :  alert, cooperative, appears stated age, and no distress  ?Respiratory:  clear to auscultation bilaterally  ?Cardiovascular:  regular rate and rhythm  ?Gastrointestinal: Soft, no guarding, focal tenderness to palpation around former ileostomy site which is clean no signs of active bleeding or obvious bruising .  NG with over a liter of bilious output recorded past 24 hours  ?Skin: Cool and moist.   ?Psychiatric: Normal affect, non-agitated, not confused  ?   ?  ?LABS:  ? ?  Latest Ref Rng & Units 07/01/2021  ?  6:16 AM 06/30/2021  ?   6:17 AM 06/29/2021  ?  3:50 AM  ?CMP  ?Glucose 70 - 99 mg/dL 114   112   138    ?BUN 6 - 20 mg/dL '14   13   13    '$ ?Creatinine 0.61 - 1.24 mg/dL 0.90   1.00   1.01    ?Sodium 135 - 145 mmol/L 136   136   135    ?Potassium 3.5 - 5.1 mmol/L 3.6   4.0   4.2    ?Chloride 98 - 111 mmol/L 108   106   107    ?CO2 22 - 32 mmol/L '24   22   22    '$ ?Calcium 8.9 - 10.3 mg/dL 7.9   8.3   8.5    ?Total Protein 6.5 - 8.1 g/dL 6.1   6.7     ?Total Bilirubin 0.3 - 1.2 mg/dL 1.0   1.0     ?Alkaline Phos 38 - 126 U/L 49   62     ?AST 15 - 41 U/L 15   16     ?ALT 0 - 44 U/L 17   19     ? ? ?  Latest Ref Rng & Units 07/01/2021  ?  6:16 AM 06/30/2021  ?  6:17 AM 06/29/2021  ?  3:50 AM  ?CBC  ?WBC 4.0 - 10.5 K/uL 7.8   8.5   13.4    ?Hemoglobin 13.0 - 17.0 g/dL 12.4   14.2   15.1    ?Hematocrit 39.0 -  52.0 % 37.9   43.2   47.0    ?Platelets 150 - 400 K/uL 129   140   156    ? ? ?RADS: ?Narrative & Impression  ?CLINICAL DATA:  Stage III colon carcinoma. Undergoing chemotherapy. ?Approximately 4 days status post ileostomy reversal. Postop ileus. ?Cardiomyopathy. Atrial fibrillation. Chronic kidney disease. ?  ?EXAM: ?DG ABDOMEN ACUTE WITH 1 VIEW CHEST ?  ?COMPARISON:  06/30/2021 ?  ?FINDINGS: ?Nasogastric tube tip is again seen overlying the mid stomach. ?Multiple moderately dilated small bowel loops are again seen in any ?air-fluid levels. These show no significant change. Increased oral ?contrast material is seen within the ascending and transverse colon. ?These findings may be due to an adynamic ileus although partial ?small bowel obstruction cannot be excluded. ?  ?Onset is stable. Right-sided power port remains in appropriate ?position. Increased linear opacity in the right lower lung is ?consistent with mild subsegmental atelectasis. No evidence of ?pulmonary consolidation or edema. ?  ?IMPRESSION: ?No significant change in adynamic ileus versus partial small bowel ?obstruction. ?  ?Mildly increased right basilar subsegmental atelectasis. ?   ?  ?Electronically Signed ?  By: Marlaine Hind M.D. ?  On: 07/01/2021 09:33  ? ?Assessment:  ? ?s/p ileostomy takedown, now with post-operative ileus most likely secondary to edema at ileostomy takedown site. ? ?Clinically unchanged with persistent signs of ileus versus obstruction on imaging as well as clinical exam.  Hemoglobin drop noted today, possibly from recent bleeding episode at the incision site.  Currently no signs of active bleeding but will repeat hemoglobin in the afternoon to continue to monitor. ? ?Encouraged ambulation, gum and hard candy as needed to see if ileus resolves.  May need to consider additional imaging prior to considering additional surgical intervention.   ? ?labs/images/medications/previous chart entries reviewed personally and relevant changes/updates noted above. ? ? ?

## 2021-07-01 NOTE — Progress Notes (Signed)
PHARMACY - TOTAL PARENTERAL NUTRITION CONSULT NOTE  ? ?Indication: Prolonged ileus ? ?Patient Measurements: ?Height: 6' (182.9 cm) ?Weight: 106.6 kg (235 lb) ?IBW/kg (Calculated) : 77.6 ?TPN AdjBW (KG): 84.8 ?Body mass index is 31.87 kg/m?. ? ?Assessment:  56 y.o.  male w/ PMH of HTN, CKD, HFrEF, T2DM, Afib, HLD, adenocarcinoma of colonstage III colon cancer is currently on adjuvant FOLFOX with prolonged ileus ? ?Glucose / Insulin: BG previous 24h 99 - 133 > 114-131 (6 units SSI required) ?Electrolytes: WNL ?Renal: stable and at apparent baseline ?Hepatic: LFTs wnl ?MIVF: dextrose 5 %-0.9 % sodium chloride infusion at 125 mL/hr ?GI Imaging: ?KUB (06/30/2021) personally reviewed with persistent small bowel dilation, stomach seems decompressed, no gross pneumoperitoneum, and radiologist report reviewed below:  ?IMPRESSION: ?1. Similar dilated small bowel loops, concerning for obstruction ?given findings on recent CT. ?2. Relative lucency in the left hemiabdomen on supine radiographs ?without definite evidence of free air on the chest radiograph. This ?finding could potentially be related to bowel loops that are not ?well visualized, but pneumoperitoneum is difficult to exclude. A ?lateral decubitus x-ray or CT could further evaluate if there is ?clinical concern. ?GI Surgeries / Procedures: s/p ileostomy takedown 06/27/21 ? ?Central access: 06/30/21  ?TPN start date: 06/30/21  ? ?Nutritional Goals: Goal rate 90 ml/hr, which will give 118.8 g of amino acid, 324 g of dextrose, and 64.8 g of emulsion.  ? ?RD Assessment:  ?Estimated Needs ?Total Energy Estimated Needs: 2200-2500kcal/day ?Total Protein Estimated Needs: 110-125g/day ?Total Fluid Estimated Needs: 2.1-2.4L/day ?Current Nutrition:  ?NPO ? ?Plan:  ?Will stop Clinimix 5/20 at 1800 and start TPN at 2m/hr at 1800.  ?39.6 g of amino acids,  ?108 g of dextrose  ?21.6 g of fat emulsion  ?Will give Kphos IV 20 mmol x 1  ?Will add thiamine 100 mg x 3 days (started  4/22).  ?Initiate Resistant q6h SSI and adjust as needed  ?Reduce MIVF to 85 mL/hr at 1800 ?Monitor TPN labs on Mon/Thurs, daily until stable ? ?KOswald Hillock PharmD, BCPS ?07/01/2021,7:44 AM ? ?

## 2021-07-02 LAB — BASIC METABOLIC PANEL
Anion gap: 6 (ref 5–15)
BUN: 8 mg/dL (ref 6–20)
CO2: 24 mmol/L (ref 22–32)
Calcium: 7.8 mg/dL — ABNORMAL LOW (ref 8.9–10.3)
Chloride: 108 mmol/L (ref 98–111)
Creatinine, Ser: 0.78 mg/dL (ref 0.61–1.24)
GFR, Estimated: >60 mL/min (ref >60)
Glucose, Bld: 104 mg/dL — ABNORMAL HIGH (ref 70–99)
Potassium: 3.3 mmol/L — ABNORMAL LOW (ref 3.5–5.1)
Sodium: 138 mmol/L (ref 135–145)

## 2021-07-02 LAB — MAGNESIUM: Magnesium: 1.8 mg/dL (ref 1.7–2.4)

## 2021-07-02 LAB — PHOSPHORUS: Phosphorus: 2.3 mg/dL — ABNORMAL LOW (ref 2.5–4.6)

## 2021-07-02 LAB — GLUCOSE, CAPILLARY
Glucose-Capillary: 107 mg/dL — ABNORMAL HIGH (ref 70–99)
Glucose-Capillary: 96 mg/dL (ref 70–99)
Glucose-Capillary: 122 mg/dL — ABNORMAL HIGH (ref 70–99)

## 2021-07-02 MED ORDER — POTASSIUM CHLORIDE 10 MEQ/100ML IV SOLN
10.0000 meq | INTRAVENOUS | Status: AC
  Administered 2021-07-02 (×4): 10 meq via INTRAVENOUS
  Filled 2021-07-02: qty 100

## 2021-07-02 MED ORDER — TRAVASOL 10 % IV SOLN: INTRAVENOUS | Status: DC

## 2021-07-02 MED ORDER — SIMETHICONE 80 MG PO CHEW
80.0000 mg | CHEWABLE_TABLET | Freq: Four times a day (QID) | ORAL | Status: DC | PRN
  Administered 2021-07-02: 80 mg via ORAL
  Filled 2021-07-02: qty 1

## 2021-07-02 MED ORDER — DEXTROSE-NACL 5-0.9 % IV SOLN: INTRAVENOUS | Status: DC

## 2021-07-02 MED ORDER — TRAVASOL 10 % IV SOLN
INTRAVENOUS | Status: AC
  Filled 2021-07-02: qty 396

## 2021-07-02 NOTE — Plan of Care (Signed)

## 2021-07-02 NOTE — Progress Notes (Addendum)
Pt. Clamped for 3 hours per order. Pt. Did not report any bloating, feeling nausea, Tube hooked back to suction at 1132, 25 mL out after 20 minutes, nurse pulled tube at 1154. Pt. Tolerated it well.  ?

## 2021-07-02 NOTE — Progress Notes (Signed)
Subjective:  ?CC: ?Tracy Gardner is a 56 y.o. male  Hospital stay day 5, 5 Days Post-Op s/p ileostomy takedown, now with post-operative ileus most likely secondary to edema at ileostomy takedown site, ?  ? ?HPI: ?No acute issues overnight.  Reports of multiple bowel movements. ? ? ?ROS:  ?General: Denies weight loss, weight gain, fatigue, fevers, chills, and night sweats. ?Heart: Denies chest pain, palpitations, racing heart, irregular heartbeat, leg pain or swelling, and decreased activity tolerance. ?Respiratory: Denies breathing difficulty, shortness of breath, wheezing, cough, and sputum. ?GI: Denies change in appetite, heartburn, nausea, vomiting, constipation, diarrhea, and blood in stool. ?GU: Denies difficulty urinating, pain with urinating, urgency, frequency, blood in urine. ? ? ?Objective:  ? ?Temp:  [97.6 ?F (36.4 ?C)-98.6 ?F (37 ?C)] 97.6 ?F (36.4 ?C) (04/23 0755) ?Pulse Rate:  [54-65] 65 (04/23 0755) ?Resp:  [16-20] 20 (04/23 0755) ?BP: (121-144)/(60-75) 125/75 (04/23 0755) ?SpO2:  [95 %-98 %] 95 % (04/23 0755)     Height: 6' (182.9 cm) Weight:  (inaccurate weight) BMI (Calculated): 31.86  ? ?Intake/Output this shift:  ? ?Intake/Output Summary (Last 24 hours) at 07/02/2021 0912 ?Last data filed at 07/02/2021 0810 ?Gross per 24 hour  ?Intake 2764.08 ml  ?Output 2130 ml  ?Net 634.08 ml  ? ? ?Constitutional :  alert, cooperative, appears stated age, and no distress  ?Respiratory:  clear to auscultation bilaterally  ?Cardiovascular:  regular rate and rhythm  ?Gastrointestinal: Soft, no guarding, tenderness to palpation resolved .    ?Skin: Cool and moist.   ?Psychiatric: Normal affect, non-agitated, not confused  ?   ?  ?LABS:  ? ?  Latest Ref Rng & Units 07/02/2021  ?  5:33 AM 07/01/2021  ?  6:16 AM 06/30/2021  ?  6:17 AM  ?CMP  ?Glucose 70 - 99 mg/dL 104   114   112    ?BUN 6 - 20 mg/dL '8   14   13    '$ ?Creatinine 0.61 - 1.24 mg/dL 0.78   0.90   1.00    ?Sodium 135 - 145 mmol/L 138   136   136    ?Potassium 3.5  - 5.1 mmol/L 3.3   3.6   4.0    ?Chloride 98 - 111 mmol/L 108   108   106    ?CO2 22 - 32 mmol/L '24   24   22    '$ ?Calcium 8.9 - 10.3 mg/dL 7.8   7.9   8.3    ?Total Protein 6.5 - 8.1 g/dL  6.1   6.7    ?Total Bilirubin 0.3 - 1.2 mg/dL  1.0   1.0    ?Alkaline Phos 38 - 126 U/L  49   62    ?AST 15 - 41 U/L  15   16    ?ALT 0 - 44 U/L  17   19    ? ? ?  Latest Ref Rng & Units 07/01/2021  ?  1:14 PM 07/01/2021  ?  6:16 AM 06/30/2021  ?  6:17 AM  ?CBC  ?WBC 4.0 - 10.5 K/uL  7.8   8.5    ?Hemoglobin 13.0 - 17.0 g/dL 12.5   12.4   14.2    ?Hematocrit 39.0 - 52.0 % 39.5   37.9   43.2    ?Platelets 150 - 400 K/uL  129   140    ? ? ?RADS: ?N/a ?Assessment:  ? ?s/p ileostomy takedown, now with post-operative ileus most likely secondary  to edema at ileostomy takedown site. ? ?Resolving ileus.  We will proceed with clamp trial and discontinue NG and advance diet once passed. ? ?labs/images/medications/previous chart entries reviewed personally and relevant changes/updates noted above. ? ? ?

## 2021-07-02 NOTE — TOC Progression Note (Signed)
Transition of Care (TOC) - Progression Note  ? ? ?Patient Details  ?Name: Tracy Gardner ?MRN: [id number] ?Date of Birth: [birth year] ? ?Transition of Care (TOC) CM/SW Contact  ?Simrat Kendrick, LCSW ?Phone Number: [phone] ?07/02/2021, 10:17 AM ? ?Clinical Narrative:    ? ?Patient five days post-op. Transition of Care Department [facility]) has reviewed patient and no TOC needs have been identified at this time. We will continue to monitor patient advancement through interdisciplinary progression rounds. If new patient transition needs arise, please place a TOC consult. ? ?  ?  ? ?Expected Discharge Plan and Services ?  ?  ?  ?  ?  ?                ?  ?  ?  ?  ?  ?  ?  ?  ?  ?  ? ? ?Social Determinants of Health (SDOH) Interventions ?  ? ?Readmission Risk Interventions ?   ? View : No data to display.  ?  ?  ?  ? ? ?

## 2021-07-02 NOTE — Progress Notes (Signed)
Home unit in room, patient states he does not want to wear his unit tonight.  ?

## 2021-07-02 NOTE — Progress Notes (Addendum)
PHARMACY - TOTAL PARENTERAL NUTRITION CONSULT NOTE  ? ?Indication: Prolonged ileus ? ?Patient Measurements: ?Height: 6' (182.9 cm) ?Weight:  (inaccurate weight) ?IBW/kg (Calculated) : 77.6 ?TPN AdjBW (KG): 84.8 ?Body mass index is 31.87 kg/m?. ? ?Assessment:  56 y.o.  male w/ PMH of HTN, CKD, HFrEF, T2DM, Afib, HLD, adenocarcinoma of colonstage III colon cancer is currently on adjuvant FOLFOX with prolonged ileus ? ?Glucose / Insulin: BG previous 24h 99 - 133 > 114-131 > 104 (25 units SSI required) ?Electrolytes: WNL ?Renal: stable and at apparent baseline ?Hepatic: LFTs wnl ?MIVF: dextrose 5 %-0.9 % sodium chloride infusion at 125 mL/hr ?GI Imaging: ?KUB (06/30/2021) personally reviewed with persistent small bowel dilation, stomach seems decompressed, no gross pneumoperitoneum, and radiologist report reviewed below:  ?IMPRESSION: ?1. Similar dilated small bowel loops, concerning for obstruction ?given findings on recent CT. ?2. Relative lucency in the left hemiabdomen on supine radiographs ?without definite evidence of free air on the chest radiograph. This ?finding could potentially be related to bowel loops that are not ?well visualized, but pneumoperitoneum is difficult to exclude. A ?lateral decubitus x-ray or CT could further evaluate if there is ?clinical concern. ?GI Surgeries / Procedures: s/p ileostomy takedown 06/27/21 ? ?Central access: 06/30/21  ?TPN start date: 06/30/21  ? ?Nutritional Goals: Goal rate 90 ml/hr, which will give 118.8 g of amino acid, 324 g of dextrose, and 64.8 g of emulsion.  ? ?RD Assessment:  ?Estimated Needs ?Total Energy Estimated Needs: 2200-2500kcal/day ?Total Protein Estimated Needs: 110-125g/day ?Total Fluid Estimated Needs: 2.1-2.4L/day ?Current Nutrition:  ?NPO ? ?Plan:  ?Will increase TPN to 50m/hr at 1800.  ?79.2 g of amino acids,  ?216 g of dextrose  ?43.2 g of fat emulsion  ?Increased phosphorus from 15 mmol/L to 25 mmol/L ?Will give KCL 10 mEq x 4.  ?Will add thiamine  100 mg x 3 days (started 4/22).  ?Initiate Resistant q6h SSI and adjust as needed  ?Reduce MIVF to 30 mL/hr at 1800 ?Monitor TPN labs on Mon/Thurs, daily until stable ? ?KOswald Hillock PharmD, BCPS ?07/02/2021,9:12 AM ? ?

## 2021-07-03 ENCOUNTER — Inpatient Hospital Stay: Payer: 59

## 2021-07-03 LAB — COMPREHENSIVE METABOLIC PANEL
ALT: 19 U/L (ref 0–44)
AST: 19 U/L (ref 15–41)
Albumin: 3.2 g/dL — ABNORMAL LOW (ref 3.5–5.0)
Alkaline Phosphatase: 56 U/L (ref 38–126)
Anion gap: 6 (ref 5–15)
BUN: 7 mg/dL (ref 6–20)
CO2: 25 mmol/L (ref 22–32)
Calcium: 8.3 mg/dL — ABNORMAL LOW (ref 8.9–10.3)
Chloride: 108 mmol/L (ref 98–111)
Creatinine, Ser: 0.84 mg/dL (ref 0.61–1.24)
GFR, Estimated: >60 mL/min (ref >60)
Glucose, Bld: 101 mg/dL — ABNORMAL HIGH (ref 70–99)
Potassium: 3.8 mmol/L (ref 3.5–5.1)
Sodium: 139 mmol/L (ref 135–145)
Total Bilirubin: 0.6 mg/dL (ref 0.3–1.2)
Total Protein: 6.7 g/dL (ref 6.5–8.1)

## 2021-07-03 LAB — GLUCOSE, CAPILLARY
Glucose-Capillary: 108 mg/dL — ABNORMAL HIGH (ref 70–99)
Glucose-Capillary: 113 mg/dL — ABNORMAL HIGH (ref 70–99)
Glucose-Capillary: 116 mg/dL — ABNORMAL HIGH (ref 70–99)
Glucose-Capillary: 98 mg/dL (ref 70–99)

## 2021-07-03 LAB — PHOSPHORUS: Phosphorus: 3.1 mg/dL (ref 2.5–4.6)

## 2021-07-03 LAB — MAGNESIUM: Magnesium: 1.8 mg/dL (ref 1.7–2.4)

## 2021-07-03 LAB — TRIGLYCERIDES: Triglycerides: 176 mg/dL — ABNORMAL HIGH (ref <150)

## 2021-07-03 MED ORDER — ENSURE ENLIVE PO LIQD
237.0000 mL | Freq: Three times a day (TID) | ORAL | Status: DC
  Administered 2021-07-03 (×2): 237 mL via ORAL

## 2021-07-03 MED ORDER — ADULT MULTIVITAMIN W/MINERALS CH
1.0000 | ORAL_TABLET | Freq: Every day | ORAL | Status: DC
  Administered 2021-07-04: 1 via ORAL
  Filled 2021-07-03: qty 1

## 2021-07-03 MED ORDER — TRAVASOL 10 % IV SOLN
INTRAVENOUS | Status: DC
  Filled 2021-07-03: qty 594

## 2021-07-03 MED ORDER — ENOXAPARIN SODIUM 120 MG/0.8ML IJ SOSY
1.0000 mg/kg | PREFILLED_SYRINGE | Freq: Two times a day (BID) | INTRAMUSCULAR | Status: DC
  Administered 2021-07-03: 108 mg via SUBCUTANEOUS
  Filled 2021-07-03 (×2): qty 0.72

## 2021-07-03 NOTE — Progress Notes (Signed)
[facility] ?SURGICAL PROGRESS NOTE ? ?Hospital Day(s): 6.  ? ?Post op day(s): 6 Days Post-Op.  ? ?Interval History:  ?Patient seen and examined ?No acute events or new complaints overnight.  ?Patient reports he is feeling significantly better ?Abdominal distension resolved ?No fever, chills, nausea, emesis  ?Renal function remains normal; sCr - 0.84; UO - 750 ccs ?No electrolyte derangements ?NGT removed yesterday after passing clamp trial ?He has 14 BM movements recorded; he states "might be triple that" ?He is on CLD ? ?Vital signs in last 24 hours: [min-max] current  ?Temp:  [97.6 ?F (36.4 ?C)-98.6 ?F (37 ?C)] 98.6 ?F (37 ?C) (04/24 0432) ?Pulse Rate:  [62-66] 66 (04/24 0432) ?Resp:  [18-20] 20 (04/24 0432) ?BP: (125-141)/(75-83) 129/80 (04/24 0432) ?SpO2:  [95 %-98 %] 97 % (04/24 0432)     Height: 6' (182.9 cm) Weight:  (inaccurate weight) BMI (Calculated): 31.86  ? ?Intake/Output last 2 shifts:  ?04/23 0701 - 04/24 0700 ?In: 1551.6 [P.O.:240; I.V.:1311.6] ?Out: 086 [Urine:750; Emesis/NG output:25]  ? ?Physical Exam:  ?Constitutional: alert, cooperative and no distress  ?Respiratory: breathing non-labored at rest  ?Cardiovascular: regular rate and sinus rhythm  ?Gastrointestinal: Soft, incisional soreness, previous distension resolved, no rebound/guarding ?Integumentary: Ileostomy incision is healing well, no further bleeding, ecchymotic, no drainage  ? ?Labs:  ? ?  Latest Ref Rng & Units 07/01/2021  ?  1:14 PM 07/01/2021  ?  6:16 AM 06/30/2021  ?  6:17 AM  ?CBC  ?WBC 4.0 - 10.5 K/uL  7.8   8.5    ?Hemoglobin 13.0 - 17.0 g/dL 12.5   12.4   14.2    ?Hematocrit 39.0 - 52.0 % 39.5   37.9   43.2    ?Platelets 150 - 400 K/uL  129   140    ? ? ?  Latest Ref Rng & Units 07/03/2021  ?  4:31 AM 07/02/2021  ?  5:33 AM 07/01/2021  ?  6:16 AM  ?CMP  ?Glucose 70 - 99 mg/dL 101   104   114    ?BUN 6 - 20 mg/dL '7   8   14    '$ ?Creatinine 0.61 - 1.24 mg/dL 0.84   0.78   0.90    ?Sodium 135 - 145 mmol/L 139   138    136    ?Potassium 3.5 - 5.1 mmol/L 3.8   3.3   3.6    ?Chloride 98 - 111 mmol/L 108   108   108    ?CO2 22 - 32 mmol/L '25   24   24    '$ ?Calcium 8.9 - 10.3 mg/dL 8.3   7.8   7.9    ?Total Protein 6.5 - 8.1 g/dL 6.7    6.1    ?Total Bilirubin 0.3 - 1.2 mg/dL 0.6    1.0    ?Alkaline Phos 38 - 126 U/L 56    49    ?AST 15 - 41 U/L 19    15    ?ALT 0 - 44 U/L 19    17    ? ? ? ?Imaging studies:  ? ?KUB + CXR (07/03/2021) personally reviewed which shows improvement in small bowel distension, air seen throughout colon, no free air, and radiologist report pending ? ? ?Assessment/Plan:  ?56 y.o. male with clinically and radiographically resolving post-operative ileus 6 Days Post-Op s/p ileostomy takedown ? ? - Advance to full liquids this morning; go slow ? - Continue half rate TPN for now;  Will continue until certain tolerating soft/regular diet ? - Monitor abdominal examination; on-going bowel function ?- Pain control prn (Minimize narcotics as feasible); antiemetics prn  ?           - Monitor renal function; normalized; good UO this morning  ?- Mobilization encouraged; needs to continue to get OOB  ?  ?- Discharge Planning; Now clinically improving, pending diet advancement, going slow. May be able to DC home in next 24-48 hours   ? ?All of the above findings and recommendations were discussed with the patient, and the medical team, and all of patient's questions were answered to his expressed satisfaction. ? ?-- ?Edison Simon, PA-C ?[facility] ?07/03/2021, 7:42 AM ?M-F: 7am - 4pm ? ?

## 2021-07-03 NOTE — Discharge Instructions (Addendum)
In addition to included general post-operative instructions, ? ?Diet: Resume home diet.  ? ?Activity: No heavy lifting >20 pounds (children, pets, laundry, garbage) for 6 weeks from date of surgery, but light activity and walking are encouraged. Do not drive or drink alcohol if taking narcotic pain medications or having pain that might distract from driving. ? ?Wound care: You may shower/get incision wet with soapy water and pat dry (do not rub incisions), but no baths or submerging incision underwater until follow-up. We will see you next week for staple removal  ? ?Medications: Resume all home medications. For mild to moderate pain: acetaminophen (Tylenol) or ibuprofen/naproxen (if no kidney disease). Combining Tylenol with alcohol can substantially increase your risk of causing liver disease. Narcotic pain medications, if prescribed, can be used for severe pain, though may cause nausea, constipation, and drowsiness. Do not combine Tylenol and Percocet (or similar) within a 6 hour period as Percocet (and similar) contain(s) Tylenol. If you do not need the narcotic pain medication, you do not need to fill the prescription. ? ?Call office [phone] / [phone]) at any time if any questions, worsening pain, fevers/chills, bleeding, drainage from incision site, or other concerns. ? ?
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
## 2021-07-03 NOTE — Progress Notes (Addendum)
PHARMACY - TOTAL PARENTERAL NUTRITION CONSULT NOTE  ? ?Indication: Prolonged ileus ? ?Patient Measurements: ?Height: 6' (182.9 cm) ?Weight:  (inaccurate weight) ?IBW/kg (Calculated) : 77.6 ?TPN AdjBW (KG): 84.8 ?Body mass index is 31.87 kg/m?. ? ?Assessment:   ?56 y/o male w/ PMH of HTN, CKD, HFrEF, T2DM, Afib, HLD, adenocarcinoma of colonstage III colon cancer is currently on adjuvant FOLFOX with prolonged ileus ? ?Glucose / Insulin: BG previous 24h ranged 96 - 108 (3 units SSI required) ?Electrolytes: Within normal value range ?Renal: Scr < 1, stable ?Hepatic: No transaminitis. LFTs within normal limits ?MIVF: Positive 2.8L for the admission. On MIVF with D5NS at 30 cc/hr. NG tube removed 4/23. 14x bowel movements documented on 4/23.  ?GI Imaging: ?KUB (06/30/2021)  ?1. Similar dilated small bowel loops, concerning for obstruction given findings on recent CT. ?2. Relative lucency in the left hemiabdomen on supine radiographs without definite evidence of free air on the chest radiograph. This finding could potentially be related to bowel loops that are not ?well visualized, but pneumoperitoneum is difficult to exclude ?GI Surgeries / Procedures:  ?06/27/21: Ileostomy takedown ? ?Central access: 06/30/21  ?TPN start date: 06/30/21  ? ?Nutritional Goals:  ?Goal rate 90 cc/hr, which will give 118.8 g of amino acid, 324 g of dextrose, and 64.8 g of emulsion.  ? ?RD Assessment:  ?Estimated Needs ?Total Energy Estimated Needs: 2200-2500kcal/day ?Total Protein Estimated Needs: 110-125g/day ?Total Fluid Estimated Needs: 2.1-2.4L/day ? ?Current Nutrition:  ?Soft diet (upgraded from CLD on 4/24) ? ?Hope is to stop TPN 4/25 if patient continues to tolerate diet advancement ? ?Plan:  ?Will increase TPN to 45 cc/hr (1/2 rate) at 1800 ?All electrolytes at standard concentration except for phosphorous which was increased from 15 mmol/L to 25 mmol/L on 4/23 ?Will add thiamine 100 mg x 3 days (day 3/3) ?Continue Resistant q6h SSI +  scheduled Novolog 6u TIDM and adjust as needed. BG has been tightly controlled. Will need to monitor closely with dietary changes  ?MIVF discontinued per discussion with primary team ?Monitor TPN labs on Mon/Thurs ? ?Benita Gutter ?07/03/2021,7:49 AM ? ?

## 2021-07-04 ENCOUNTER — Telehealth: Payer: Self-pay | Admitting: *Deleted

## 2021-07-04 ENCOUNTER — Other Ambulatory Visit: Payer: Self-pay | Admitting: *Deleted

## 2021-07-04 LAB — CBC
HCT: 38.5 % — ABNORMAL LOW (ref 39.0–52.0)
Hemoglobin: 12.6 g/dL — ABNORMAL LOW (ref 13.0–17.0)
MCH: 28.6 pg (ref 26.0–34.0)
MCHC: 32.7 g/dL (ref 30.0–36.0)
MCV: 87.3 fL (ref 80.0–100.0)
Platelets: 148 10*3/uL — ABNORMAL LOW (ref 150–400)
RBC: 4.41 MIL/uL (ref 4.22–5.81)
RDW: 21.2 % — ABNORMAL HIGH (ref 11.5–15.5)
WBC: 5.3 10*3/uL (ref 4.0–10.5)
nRBC: 0 % (ref 0.0–0.2)

## 2021-07-04 LAB — GLUCOSE, CAPILLARY
Glucose-Capillary: 120 mg/dL — ABNORMAL HIGH (ref 70–99)
Glucose-Capillary: 122 mg/dL — ABNORMAL HIGH (ref 70–99)

## 2021-07-04 MED ORDER — ENOXAPARIN SODIUM 120 MG/0.8ML IJ SOSY
1.0000 mg/kg | PREFILLED_SYRINGE | Freq: Two times a day (BID) | INTRAMUSCULAR | Status: DC
  Administered 2021-07-04: 114 mg via SUBCUTANEOUS
  Filled 2021-07-04: qty 0.76

## 2021-07-04 MED ORDER — HEPARIN SOD (PORK) LOCK FLUSH 100 UNIT/ML IV SOLN
500.0000 [IU] | INTRAVENOUS | Status: AC | PRN
  Administered 2021-07-04: 500 [IU]
  Filled 2021-07-04: qty 5

## 2021-07-04 NOTE — Discharge Summary (Signed)
[facility] ?SURGICAL DISCHARGE SUMMARY ? ?Patient ID: ?Tracy Gardner ?MRN: [id number] ?DOB/AGE: [birth year] 56 y.o. ? ?Admit date: 06/27/2021 ?Discharge date: 07/04/2021 ? ?Discharge Diagnoses ?Patient Active Problem List  ? Diagnosis Date Noted  ? S/P closure of ileostomy 06/27/2021  ? ? ?Consultants ?None ? ?Procedures ?04/18:  ?Ileostomy Takedown ? ?HPI: Tracy Gardner is a 56 y.o. male with a history of diverting ileostomy after ultra-low LAR for distal colon/rectal malignancy who presents to [facility] on 04/18 for ileostomy takedown.  ? ?Hospital Course: Informed consent was obtained and documented, and patient underwent uneventful ileostomy takedown (Dr Dahlia Byes, 06/27/2021).  Post-operatively, patient developed post-operative ileus on POD1. He had NGT placed on 04/19. PICC and TPN were started on 04/21. Ultimately, he had ROBF on 04/23 with discontinuation of NGT. Advancement of patient's diet and ambulation were well-tolerated. The remainder of patient's hospital course was essentially unremarkable, and discharge planning was initiated accordingly with patient safely able to be discharged home with appropriate discharge instructions, pain control, and outpatient follow-up after all of his questions were answered to his expressed satisfaction.  ? ?Discharge Condition: Good ? ? ?Physical Examination:  ?Constitutional: alert, cooperative and no distress  ?Respiratory: breathing non-labored at rest  ?Cardiovascular: regular rate and sinus rhythm  ?Gastrointestinal: Soft, incisional soreness, previous distension resolved, no rebound/guarding ?Integumentary: Ileostomy incision is healing well, no further bleeding, ecchymotic, no drainage  ? ? ?Allergies as of 07/04/2021   ?No Known Allergies ?  ? ?  ?Medication List  ?  ? ?TAKE these medications   ? ?acetaminophen 500 MG tablet ?Commonly known as: TYLENOL ?Take 1,000 mg by mouth every 6 (six) hours as needed for mild pain. ?  ?albuterol 108 (90 Base) MCG/ACT  inhaler ?Commonly known as: VENTOLIN HFA ?Inhale 2 puffs into the lungs 4 times a day as needed for shortness of breath, wheezing and cough ?  ?amiodarone 200 MG tablet ?Commonly known as: PACERONE ?Take 1 tablet (200 mg total) by mouth daily. Hold for HR less then 50 ?What changed: when to take this ?  ?apixaban 5 MG Tabs tablet ?Commonly known as: ELIQUIS ?Take 5 mg by mouth 2 (two) times daily. ?  ?cetirizine 10 MG tablet ?Commonly known as: ZYRTEC ?Take 10 mg by mouth every morning. ?  ?doxylamine (Sleep) 25 MG tablet ?Commonly known as: UNISOM ?Take 25 mg by mouth at bedtime as needed. ?  ?Flovent HFA 110 MCG/ACT inhaler ?Generic drug: fluticasone ?Inhale into the lungs 2 (two) times daily. ?What changed:  ?how much to take ?when to take this ?reasons to take this ?  ?FreeStyle Freedom Lite w/Device Kit ?#Check blood glucose fasting/in the morning; after lunch; and after dinner. ?  ?freestyle lancets ?#Check blood glucose fasting/in the morning; after lunch; and after dinner. ?  ?FREESTYLE LITE test strip ?Generic drug: glucose blood ?Use as directed to test 3 times daily ?  ?Jardiance 10 MG Tabs tablet ?Generic drug: empagliflozin ?Take 1 tablet (10 mg total) by mouth daily. ?  ?lidocaine-prilocaine cream ?Commonly known as: EMLA ?Apply to affected area once ?  ?loperamide 2 MG tablet ?Commonly known as: Imodium A-D ?Take 1 tablet (2 mg total) by mouth 4 (four) times daily as needed for diarrhea or loose stools. ?  ?multivitamin with minerals tablet ?Take 1 tablet by mouth daily. ?  ?Omron 3 Series BP Monitor Devi ?Use as directed ?  ?prochlorperazine 10 MG tablet ?Commonly known as: COMPAZINE ?Take 1 tablet (10 mg total) by mouth every 6 (  six) hours as needed for nausea or vomiting. ?  ?rosuvastatin 10 MG tablet ?Commonly known as: CRESTOR ?Take 1 tablet (10 mg total) by mouth every evening. ?  ? ?  ? ? ? ? Follow-up Information   ? ? Pabon, [place] F, MD. Schedule an appointment as soon as possible for a visit  in 1 week(s).   ?Specialty: General Surgery ?Why: follow up ileostomy takedown, has staples....okay to see Thedore Mins if needed, can double book Thedore Mins as well ?Contact information: ?[address] ?[address] ?[place] [address] ?[phone] ? ? ?  ?  ? ?  ?  ? ?  ? ? ? ?Time spent on discharge management including discussion of hospital course, clinical condition, outpatient instructions, prescriptions, and follow up with the patient and members of the medical team: >30 minutes ? ?-- ?Edison Simon , PA-C ? [facility]  ?07/04/2021, 9:23 AM ?[phone] ?M-F: 7am - 4pm ? ?

## 2021-07-04 NOTE — Progress Notes (Signed)
Discharge instructions reviewed with patient and wife at bedside. Patient states there are no further questions or concerns at this time. PIV, PICC, and port access removed with no complications. Patient is safe and stable to DC home with wife.  ?

## 2021-07-04 NOTE — Telephone Encounter (Signed)
Patient called asking to reschedule his appts, he is being discharged from hospital today ?

## 2021-07-04 NOTE — Patient Outreach (Signed)
[facility]) Care Management ?Geriatric Nurse Practitioner Note ? ? ?07/04/2021 ?Name:  Tracy Gardner MRN:  [id number] DOB:  Jun 23, 1965 ? ?Summary: ?Post op reanastomosis of bowel. Discharged home today. ? ?Recommendations/Changes made from today's visit: ?Call MD for any complaint to avoid complications. ? ? ?Subjective: ?Tracy Gardner is an 56 y.o. year old male who is a primary patient of Tracy Rossetti, PA-C. The care management team was consulted for assistance with care management and/or care coordination needs.  Brief conversation with pt today. Just came home from the hospital. He reports he has all his meds and his follow up appt is next week. ? ?Geriatric Nurse Practitioner completed Telephone Visit today.  ? ?Patient was recently discharged from hospital and all medications have been reviewed. ?Outpatient Encounter Medications as of 07/04/2021  ?Medication Sig  ? acetaminophen (TYLENOL) 500 MG tablet Take 1,000 mg by mouth every 6 (six) hours as needed for mild pain.  ? albuterol (VENTOLIN HFA) 108 (90 Base) MCG/ACT inhaler Inhale 2 puffs into the lungs 4 times a day as needed for shortness of breath, wheezing and cough  ? amiodarone (PACERONE) 200 MG tablet Take 1 tablet (200 mg total) by mouth daily. Hold for HR less then 50 (Patient taking differently: Take 200 mg by mouth every morning. Hold for HR less then 50)  ? apixaban (ELIQUIS) 5 MG TABS tablet Take 5 mg by mouth 2 (two) times daily.  ? blood glucose meter kit and supplies KIT #Check blood glucose fasting/in the morning; after lunch; and after dinner.  ? Blood Pressure Monitoring (OMRON 3 SERIES BP MONITOR) DEVI Use as directed  ? cetirizine (ZYRTEC) 10 MG tablet Take 10 mg by mouth every morning.  ? doxylamine, Sleep, (UNISOM) 25 MG tablet Take 25 mg by mouth at bedtime as needed.  ? empagliflozin (JARDIANCE) 10 MG TABS tablet Take 1 tablet (10 mg total) by mouth daily.  ? fluticasone (FLOVENT HFA) 110 MCG/ACT inhaler Inhale  into the lungs 2 (two) times daily. (Patient taking differently: Inhale 1 puff into the lungs 2 (two) times daily as needed (shortness of breath).)  ? glucose blood (FREESTYLE LITE) test strip Use as directed to test 3 times daily  ? Lancets (FREESTYLE) lancets #Check blood glucose fasting/in the morning; after lunch; and after dinner.  ? lidocaine-prilocaine (EMLA) cream Apply to affected area once  ? loperamide (IMODIUM A-D) 2 MG tablet Take 1 tablet (2 mg total) by mouth 4 (four) times daily as needed for diarrhea or loose stools. (Patient not taking: Reported on 06/22/2021)  ? Multiple Vitamins-Minerals (MULTIVITAMIN WITH MINERALS) tablet Take 1 tablet by mouth daily.  ? prochlorperazine (COMPAZINE) 10 MG tablet Take 1 tablet (10 mg total) by mouth every 6 (six) hours as needed for nausea or vomiting.  ? rosuvastatin (CRESTOR) 10 MG tablet Take 1 tablet (10 mg total) by mouth every evening.  ? ?No facility-administered encounter medications on file as of 07/04/2021.  ? ?Care Plan ? ?Review of patient past medical history, allergies, medications, health status, including review of consultants reports, laboratory and other test data, was performed as part of comprehensive evaluation for care management services.  ? ?Care Plan : [place] Management Plan of Care  ?Updates made by Deloria Lair, NP since 07/04/2021 12:00 AM  ?  ? ?Problem: Adenocarcinoma of colon   ?Priority: High  ?Onset Date: 03/29/2021  ?  ? ?Long-Range Goal: Patient will follow oncology plan of care as reported by pt over  the next 60 days. Completed 07/04/2021  ?Start Date: 04/28/2021  ?Expected End Date: 07/07/2021  ?This Visit's Progress: On track  ?Recent Progress: On track  ?Priority: High  ?Note:   ? ?Update 07/04/21:  (Status: Goal Met.) Long Term Goal  ?Evaluation of current treatment plan related to Oncology plan and patient's adherence to plan as established by provider ?      Tracy Gardner has followed up as scheduled for all of his appts and  ongoing txs. ?Encouraged him to continue his regimen with them. ? ?Update 05/29/21:  (Status: Goal on Track (progressing): YES.) Long Term Goal  ?Evaluation of current treatment plan related to Cancer treatment and patient's adherence to plan as established by provider ?      Tracy Gardner is tolerating his chemo well. He does report that he does have some side effects: feeling very cold, then hot, tingling in  ?      his hands, feet and mouth when he drinks something very cold. He is very tired for 2-3 days after the chemo. He has not missed       ?      any appts. Encouraged early call to MD if any troublesome problem occurs. ? ?Current Barriers:  ?Knowledge Deficits related to plan of care for management of Adenocarcinoma of colon.  ? ?RNCM Clinical Goal(s):  ?Patient will verbalize understanding of plan for management of cancer as evidenced by sharing of information with NP each month. ?attend all scheduled medical appointments: Oncology as evidenced by pt report and chart review.        ?work with Oncology to progress through their plan of care as evidenced by pt report and chart review.  through collaboration with Consulting civil engineer, provider, and care team.  ? ?Interventions: ?Evaluation of current treatment plan related to  self management and patient's adherence to plan as established by provider ?      Patient has received one chemotherapy treatment yesterday. He tolerated the procedure well and is not having any immediate side   ?      effects. He has his schedule for upcoming labs, appts and infusions. His wife is very supportive and available to him.  ? ?Patient Goals/Self-Care Activities: ?Attend all scheduled provider appointments ?Call provider office for new concerns or questions  ? ? ?  ? ?Long-Range Goal: Patient will attend GI consults for preparation and plan to undergo reanastomosis of his bowel over the next 3 months as evidenced by pt report and chart review.. Completed 07/04/2021  ?Start Date:  04/28/2021  ?Expected End Date: 08/09/2021  ?This Visit's Progress: On track  ?Priority: Medium  ?Note:   ? ?Update 07/04/21:  (Status: Goal Met.) Long Term Goal  ?Evaluation of current treatment plan related to Reanastomosis and patient's adherence to plan as established by provider ?      Pt underwent surgery for reanastomosis of his bowel on 4/18. He had an ileus post op and this resolved. He was discharged today and is now home. ?Since he just got home we agreed to talk next week unless anything unusual happens and he was advised to call MD or he can call NP with any issues. This goal is now completed. ? ?Update 05/29/21:  (Status: Goal on Track (progressing): YES.) Long Term Goal  ?Evaluation of current treatment plan related to Follow Up Surgical Plan  and patient's adherence to plan as established by provider ?      Pt had appt with his GI  surgeon and was evaluated for reanastomosis of his ileostomy. MD wants pt blood counts to be improved  ?      before doing the surgery. They will see each other again in one month for re-evaluation. Pt is caring for his ostomy and is not  ?      having any problems. ? ? ?Current Barriers:  ?Knowledge Deficits related to plan of care for management of Adenocarcinoma of colon.  ? ?RNCM Clinical Goal(s):  ?Patient will Knowledge Deficits related to plan of care for management of Adenocarcinoma of colon, through collaboration with RN Care   ?       manager, provider, and care team.  ? ?Interventions: ?Monitor Inter-disciplinary care team collaboration (see longitudinal plan of care) ?Evaluation of current treatment plan related to  self management and patient's adherence to plan as established by provider ?      Patient reports he will be having an upcoming appt with GI to evaluate if they can proceed with a plan to perform reanastomosis of his  ?      bowel. He is able to care for his ileostomy by himself and with the aid of his wife. No complications since our last conversation.  Has  ?      received his supplies from [facility]. ? ?Patient Goals/Self-Care Activities: ?Attend all scheduled provider appointments ?Perform all self care activities independently  ?Call provider office for new

## 2021-07-06 ENCOUNTER — Other Ambulatory Visit: Payer: Self-pay

## 2021-07-09 DIAGNOSIS — G4733 Obstructive sleep apnea (adult) (pediatric): Secondary | ICD-10-CM | POA: Diagnosis not present

## 2021-07-11 ENCOUNTER — Ambulatory Visit: Payer: 59 | Admitting: Internal Medicine

## 2021-07-11 ENCOUNTER — Ambulatory Visit: Payer: 59

## 2021-07-11 ENCOUNTER — Other Ambulatory Visit: Payer: 59

## 2021-07-11 ENCOUNTER — Encounter: Payer: Self-pay | Admitting: Physician Assistant

## 2021-07-11 ENCOUNTER — Other Ambulatory Visit: Payer: Self-pay

## 2021-07-11 ENCOUNTER — Ambulatory Visit (INDEPENDENT_AMBULATORY_CARE_PROVIDER_SITE_OTHER): Payer: 59 | Admitting: Physician Assistant

## 2021-07-11 DIAGNOSIS — C189 Malignant neoplasm of colon, unspecified: Secondary | ICD-10-CM

## 2021-07-11 DIAGNOSIS — Z09 Encounter for follow-up examination after completed treatment for conditions other than malignant neoplasm: Secondary | ICD-10-CM

## 2021-07-11 DIAGNOSIS — C187 Malignant neoplasm of sigmoid colon: Secondary | ICD-10-CM

## 2021-07-11 NOTE — Patient Instructions (Addendum)
Please call the office with any questions or concerns. ? ?Try Miralax once or twice every day until regular bowel movements occur. Please increase water intake. ? ?We removed staples today. We placed steri strips. You may allow soapy water to run over them but do not scrub on them.  ? ?Please see your follow up appointment listed below. ?

## 2021-07-11 NOTE — Progress Notes (Signed)
[facility] ?POST-OP OFFICE VISIT ? ?07/11/2021 ? ?HPI: ?Tracy Gardner is a 56 y.o. male 14 days s/p ileostomy takedown with Dr Dahlia Byes which was complicated by post-operative ileus requiring TPN.  ? ?Since discharge, he has done very well. Pain is minimal and well controlled ?He does endorse a strong appetite but that his bowel movements has been very small. Continues to pass flatus. Has tried OTC laxatives and suppositories without significant improvement. His wife does note that he has been passing barium (likely from BE) ?No fever, chills, nausea, emesis ?No issues with ileostomy site ?No other complaints  ? ?Physical Exam: ?Constitutional: Well appearing male, NAD ?Abdomen: Soft, non-tender, non-distended, no rebound/guarding ?Skin: Ileostomy incision is healing well with staples (removed); There is dried blood throughout most of the incision consistent with episode of bleeding in the hospital. Surrounding skin is ecchymotic, no erythema ? ?Assessment/Plan: ?This is a 56 y.o. male 14 days s/p ileostomy takedown which was complicated by post-operative ileus requiring TPN. ? ? - Staples removed; steri-strips placed; wound care reviewed ? - Pain control prn ? - Reviewed lifting restrictions; recommend 6 weeks total. Briefly discussed return to work options and when he may be ready from surgical perspective ? - He will follow up in ~2 weeks for recheck  ? ?-- ?Edison Simon, PA-C ?[facility] ?07/11/2021, 3:28 PM ?M-F: 7am - 4pm ? ?

## 2021-07-13 ENCOUNTER — Other Ambulatory Visit: Payer: 59 | Admitting: *Deleted

## 2021-07-13 ENCOUNTER — Other Ambulatory Visit: Payer: Self-pay | Admitting: *Deleted

## 2021-07-13 NOTE — Patient Outreach (Signed)
[facility]) Care Management ?Geriatric Nurse Practitioner Note ? ? ?07/13/2021 ?Name:  Tracy Gardner MRN:  [id number] DOB:  22-Feb-1966 ? ?Summary: ?Pt has recovered from his ileostomy takedown. Starting to be more active. Following restrictions. Will resume chemo in a week. ? ?Recommendations/Changes made from today's visit: ?Call MD/NP with any questions or problems. ?Call [facility] [employer] for assistance with contacting disability representative. ? ?Subjective: ?Tracy Gardner is an 56 y.o. year old male who is a primary patient of [name], PA-C. The care management team was consulted for assistance with care management and/or care coordination needs.   ? ?Geriatric Nurse Practitioner completed Telephone Visit today.  ? ?Outpatient Encounter Medications as of 07/13/2021  ?Medication Sig  ? acetaminophen (TYLENOL) 500 MG tablet Take 1,000 mg by mouth every 6 (six) hours as needed for mild pain.  ? albuterol (VENTOLIN HFA) 108 (90 Base) MCG/ACT inhaler Inhale 2 puffs into the lungs 4 times a day as needed for shortness of breath, wheezing and cough  ? amiodarone (PACERONE) 200 MG tablet Take 1 tablet (200 mg total) by mouth daily. Hold for HR less then 50 (Patient taking differently: Take 200 mg by mouth every morning. Hold for HR less then 50)  ? apixaban (ELIQUIS) 5 MG TABS tablet Take 5 mg by mouth 2 (two) times daily.  ? blood glucose meter kit and supplies KIT #Check blood glucose fasting/in the morning; after lunch; and after dinner.  ? Blood Pressure Monitoring (OMRON 3 SERIES BP MONITOR) DEVI Use as directed  ? cetirizine (ZYRTEC) 10 MG tablet Take 10 mg by mouth every morning.  ? doxylamine, Sleep, (UNISOM) 25 MG tablet Take 25 mg by mouth at bedtime as needed.  ? empagliflozin (JARDIANCE) 10 MG TABS tablet Take 1 tablet (10 mg total) by mouth daily.  ? fluticasone (FLOVENT HFA) 110 MCG/ACT inhaler Inhale into the lungs 2 (two) times daily. (Patient taking differently: Inhale 1 puff into  the lungs 2 (two) times daily as needed (shortness of breath).)  ? glucose blood (FREESTYLE LITE) test strip Use as directed to test 3 times daily  ? Lancets (FREESTYLE) lancets #Check blood glucose fasting/in the morning; after lunch; and after dinner.  ? lidocaine-prilocaine (EMLA) cream Apply to affected area once  ? loperamide (IMODIUM A-D) 2 MG tablet Take 1 tablet (2 mg total) by mouth 4 (four) times daily as needed for diarrhea or loose stools.  ? Multiple Vitamins-Minerals (MULTIVITAMIN WITH MINERALS) tablet Take 1 tablet by mouth daily.  ? prochlorperazine (COMPAZINE) 10 MG tablet Take 1 tablet (10 mg total) by mouth every 6 (six) hours as needed for nausea or vomiting.  ? rosuvastatin (CRESTOR) 10 MG tablet Take 1 tablet (10 mg total) by mouth every evening.  ? ?No facility-administered encounter medications on file as of 07/13/2021.  ? ?Care Plan ? ?Review of patient past medical history, allergies, medications, health status, including review of consultants reports, laboratory and other test data, was performed as part of comprehensive evaluation for care management services.  ? ?Care Plan : [place] Management Plan of Care  ?Updates made by Deloria Lair, NP since 07/13/2021 12:00 AM  ?  ? ?Problem: Adenocarcinoma of colon   ?Priority: High  ?Onset Date: 03/29/2021  ?  ? ?Goal: Patient will resume his chemotherapy over the next 30 days.   ?Start Date: 07/13/2021  ?Expected End Date: 08/10/2021  ?Priority: High  ?Note:   ?Current Barriers:  ?No barriers  ? ?RNCM Clinical Goal(s):  ?  Patient will verbalize understanding of plan for management of Adenocarcinoma as evidenced by pt report. ?demonstrate ongoing health management independence as evidenced by pt report and chart review.        through collaboration with Consulting civil engineer, provider, and care team.  ? ?Interventions: ?Inter-disciplinary care team collaboration (see longitudinal plan of care) ?Evaluation of current treatment plan related to  self management  and patient's adherence to plan as established by provider ?Reviewed with pt his oncology plan of care. He will have 5-6 more chemotherapy treatments. Starting May 9th. Reinforced importance of calling when there is any problem or he has any questions. ? ?Patient Goals/Self-Care Activities: ?Attend all scheduled provider appointments ? ? ? ? ? ?  ?  ? ?Plan: Telephone follow up appointment with care management team member scheduled for:  June 1st ?Pt agrees to plan of care. ? ?Eulah Pont. Jyden Kromer, MSN, GNP-BC ?Gerontological Nurse Practitioner ?[facility] Care Management ?[phone] ? ? ? ? ? ?

## 2021-07-17 ENCOUNTER — Encounter: Payer: Self-pay | Admitting: Surgery

## 2021-07-18 ENCOUNTER — Inpatient Hospital Stay: Payer: 59 | Attending: Internal Medicine

## 2021-07-18 ENCOUNTER — Encounter: Payer: Self-pay | Admitting: Internal Medicine

## 2021-07-18 ENCOUNTER — Inpatient Hospital Stay: Payer: 59

## 2021-07-18 ENCOUNTER — Inpatient Hospital Stay (HOSPITAL_BASED_OUTPATIENT_CLINIC_OR_DEPARTMENT_OTHER): Payer: 59 | Admitting: Internal Medicine

## 2021-07-18 DIAGNOSIS — Z5111 Encounter for antineoplastic chemotherapy: Secondary | ICD-10-CM | POA: Diagnosis not present

## 2021-07-18 DIAGNOSIS — R202 Paresthesia of skin: Secondary | ICD-10-CM | POA: Insufficient documentation

## 2021-07-18 DIAGNOSIS — D701 Agranulocytosis secondary to cancer chemotherapy: Secondary | ICD-10-CM | POA: Insufficient documentation

## 2021-07-18 DIAGNOSIS — Z7901 Long term (current) use of anticoagulants: Secondary | ICD-10-CM | POA: Insufficient documentation

## 2021-07-18 DIAGNOSIS — Z87442 Personal history of urinary calculi: Secondary | ICD-10-CM | POA: Insufficient documentation

## 2021-07-18 DIAGNOSIS — R200 Anesthesia of skin: Secondary | ICD-10-CM | POA: Diagnosis not present

## 2021-07-18 DIAGNOSIS — C189 Malignant neoplasm of colon, unspecified: Secondary | ICD-10-CM | POA: Diagnosis not present

## 2021-07-18 DIAGNOSIS — R5383 Other fatigue: Secondary | ICD-10-CM | POA: Diagnosis not present

## 2021-07-18 DIAGNOSIS — E119 Type 2 diabetes mellitus without complications: Secondary | ICD-10-CM | POA: Insufficient documentation

## 2021-07-18 DIAGNOSIS — C19 Malignant neoplasm of rectosigmoid junction: Secondary | ICD-10-CM | POA: Insufficient documentation

## 2021-07-18 DIAGNOSIS — Z79899 Other long term (current) drug therapy: Secondary | ICD-10-CM | POA: Diagnosis not present

## 2021-07-18 DIAGNOSIS — Z8249 Family history of ischemic heart disease and other diseases of the circulatory system: Secondary | ICD-10-CM | POA: Insufficient documentation

## 2021-07-18 DIAGNOSIS — Z87891 Personal history of nicotine dependence: Secondary | ICD-10-CM | POA: Diagnosis not present

## 2021-07-18 DIAGNOSIS — C187 Malignant neoplasm of sigmoid colon: Secondary | ICD-10-CM | POA: Diagnosis not present

## 2021-07-18 DIAGNOSIS — I10 Essential (primary) hypertension: Secondary | ICD-10-CM | POA: Insufficient documentation

## 2021-07-18 DIAGNOSIS — I4819 Other persistent atrial fibrillation: Secondary | ICD-10-CM | POA: Insufficient documentation

## 2021-07-18 DIAGNOSIS — I429 Cardiomyopathy, unspecified: Secondary | ICD-10-CM | POA: Diagnosis not present

## 2021-07-18 DIAGNOSIS — D509 Iron deficiency anemia, unspecified: Secondary | ICD-10-CM | POA: Diagnosis not present

## 2021-07-18 DIAGNOSIS — R519 Headache, unspecified: Secondary | ICD-10-CM | POA: Diagnosis not present

## 2021-07-18 DIAGNOSIS — Z905 Acquired absence of kidney: Secondary | ICD-10-CM | POA: Diagnosis not present

## 2021-07-18 DIAGNOSIS — Z833 Family history of diabetes mellitus: Secondary | ICD-10-CM | POA: Diagnosis not present

## 2021-07-18 DIAGNOSIS — D6959 Other secondary thrombocytopenia: Secondary | ICD-10-CM | POA: Insufficient documentation

## 2021-07-18 DIAGNOSIS — T451X5A Adverse effect of antineoplastic and immunosuppressive drugs, initial encounter: Secondary | ICD-10-CM | POA: Diagnosis not present

## 2021-07-18 LAB — COMPREHENSIVE METABOLIC PANEL
ALT: 30 U/L (ref 0–44)
AST: 30 U/L (ref 15–41)
Albumin: 4 g/dL (ref 3.5–5.0)
Alkaline Phosphatase: 67 U/L (ref 38–126)
Anion gap: 6 (ref 5–15)
BUN: 16 mg/dL (ref 6–20)
CO2: 23 mmol/L (ref 22–32)
Calcium: 8.6 mg/dL — ABNORMAL LOW (ref 8.9–10.3)
Chloride: 105 mmol/L (ref 98–111)
Creatinine, Ser: 1.26 mg/dL — ABNORMAL HIGH (ref 0.61–1.24)
GFR, Estimated: >60 mL/min (ref >60)
Glucose, Bld: 162 mg/dL — ABNORMAL HIGH (ref 70–99)
Potassium: 3.6 mmol/L (ref 3.5–5.1)
Sodium: 134 mmol/L — ABNORMAL LOW (ref 135–145)
Total Bilirubin: 0.7 mg/dL (ref 0.3–1.2)
Total Protein: 7.7 g/dL (ref 6.5–8.1)

## 2021-07-18 LAB — CBC WITH DIFFERENTIAL/PLATELET
Abs Immature Granulocytes: 0.02 10*3/uL (ref 0.00–0.07)
Basophils Absolute: 0 10*3/uL (ref 0.0–0.1)
Basophils Relative: 1 %
Eosinophils Absolute: 0.3 10*3/uL (ref 0.0–0.5)
Eosinophils Relative: 7 %
HCT: 44.1 % (ref 39.0–52.0)
Hemoglobin: 14.7 g/dL (ref 13.0–17.0)
Immature Granulocytes: 1 %
Lymphocytes Relative: 20 %
Lymphs Abs: 0.8 10*3/uL (ref 0.7–4.0)
MCH: 29.8 pg (ref 26.0–34.0)
MCHC: 33.3 g/dL (ref 30.0–36.0)
MCV: 89.3 fL (ref 80.0–100.0)
Monocytes Absolute: 0.4 10*3/uL (ref 0.1–1.0)
Monocytes Relative: 10 %
Neutro Abs: 2.3 10*3/uL (ref 1.7–7.7)
Neutrophils Relative %: 61 %
Platelets: 135 10*3/uL — ABNORMAL LOW (ref 150–400)
RBC: 4.94 MIL/uL (ref 4.22–5.81)
RDW: 18.1 % — ABNORMAL HIGH (ref 11.5–15.5)
WBC: 3.8 10*3/uL — ABNORMAL LOW (ref 4.0–10.5)
nRBC: 0 % (ref 0.0–0.2)

## 2021-07-18 MED ORDER — SODIUM CHLORIDE 0.9 % IV SOLN
10.0000 mg | Freq: Once | INTRAVENOUS | Status: AC
  Administered 2021-07-18: 10 mg via INTRAVENOUS
  Filled 2021-07-18: qty 10
  Filled 2021-07-18: qty 1

## 2021-07-18 MED ORDER — PALONOSETRON HCL INJECTION 0.25 MG/5ML
0.2500 mg | Freq: Once | INTRAVENOUS | Status: AC
  Administered 2021-07-18: 0.25 mg via INTRAVENOUS
  Filled 2021-07-18: qty 5

## 2021-07-18 MED ORDER — SODIUM CHLORIDE 0.9% FLUSH
10.0000 mL | INTRAVENOUS | Status: DC | PRN
  Administered 2021-07-18: 10 mL via INTRAVENOUS
  Filled 2021-07-18: qty 10

## 2021-07-18 MED ORDER — LEUCOVORIN CALCIUM INJECTION 350 MG
408.0000 mg/m2 | Freq: Once | INTRAVENOUS | Status: AC
  Administered 2021-07-18: 950 mg via INTRAVENOUS
  Filled 2021-07-18: qty 47.5

## 2021-07-18 MED ORDER — SODIUM CHLORIDE 0.9 % IV SOLN
2400.0000 mg/m2 | INTRAVENOUS | Status: DC
  Administered 2021-07-18: 5600 mg via INTRAVENOUS
  Filled 2021-07-18: qty 112

## 2021-07-18 MED ORDER — DEXTROSE 5 % IV SOLN
Freq: Once | INTRAVENOUS | Status: AC
  Filled 2021-07-18: qty 250

## 2021-07-18 MED ORDER — OXALIPLATIN CHEMO INJECTION 100 MG/20ML
85.0000 mg/m2 | Freq: Once | INTRAVENOUS | Status: AC
  Administered 2021-07-18: 200 mg via INTRAVENOUS
  Filled 2021-07-18: qty 40

## 2021-07-18 NOTE — Patient Instructions (Addendum)
[facility] CANCER CTR AT [place]  Discharge Instructions: ?Thank you for choosing [facility] to provide your oncology and hematology care.  ?If you have a lab appointment with the [place], please go directly to the [place] and check in at the registration area. ? ?Wear comfortable clothing and clothing appropriate for easy access to any Portacath or PICC line.  ? ?We strive to give you quality time with your provider. You may need to reschedule your appointment if you arrive late (15 or more minutes).  Arriving late affects you and other patients whose appointments are after yours.  Also, if you miss three or more appointments without notifying the office, you may be dismissed from the clinic at the provider?s discretion.    ?  ?For prescription refill requests, have your pharmacy contact our office and allow 72 hours for refills to be completed.   ? ?Today you received the following chemotherapy and/or immunotherapy agents OXALIPLATIN, LEUCOVORIN & 5FU    ?  ?To help prevent nausea and vomiting after your treatment, we encourage you to take your nausea medication as directed. ? ?BELOW ARE SYMPTOMS THAT SHOULD BE REPORTED IMMEDIATELY: ?*FEVER GREATER THAN 100.4 F (38 ?C) OR HIGHER ?*CHILLS OR SWEATING ?*NAUSEA AND VOMITING THAT IS NOT CONTROLLED WITH YOUR NAUSEA MEDICATION ?*UNUSUAL SHORTNESS OF BREATH ?*UNUSUAL BRUISING OR BLEEDING ?*URINARY PROBLEMS (pain or burning when urinating, or frequent urination) ?*BOWEL PROBLEMS (unusual diarrhea, constipation, pain near the anus) ?TENDERNESS IN MOUTH AND THROAT WITH OR WITHOUT PRESENCE OF ULCERS (sore throat, sores in mouth, or a toothache) ?UNUSUAL RASH, SWELLING OR PAIN  ?UNUSUAL VAGINAL DISCHARGE OR ITCHING  ? ?Items with * indicate a potential emergency and should be followed up as soon as possible or go to the Emergency Department if any problems should occur. ? ?Please show the CHEMOTHERAPY ALERT CARD or IMMUNOTHERAPY ALERT  CARD at check-in to the Emergency Department and triage nurse. ? ?Should you have questions after your visit or need to cancel or reschedule your appointment, please contact [facility] CAN[facility]ER [place] AT [place]  [phone] and follow the prompts.  Office hours are 8:00 a.m. to 4:30 p.m. Monday - Friday. Please note that voicemails left after 4:00 p.m. may not be returned until the following business day.  We are closed weekends and major holidays. You have access to a nurse at all times for urgent questions. Please call the main number to the clinic [phone] and follow the prompts. ? ?For any non-urgent questions, you may also contact your provider using MyChart. We now offer e-Visits for anyone 66 and older to request care online for non-urgent symptoms. For details visit [email or web address]. ?  ?Also download the MyChart app! Go to the app store, search "MyChart", open the app, select [place], and log in with your MyChart username and password. ? ?Due to Covid, a mask is required upon entering the hospital/clinic. If you do not have a mask, one will be given to you upon arrival. For doctor visits, patients may have 1 support person aged 48 or older with them. For treatment visits, patients cannot have anyone with them due to current Covid guidelines and our immunocompromised population.  ? ?Leucovorin injection ?What is this medication? ?LEUCOVORIN (loo koe VOR in) is used to prevent or treat the harmful effects of some medicines. This medicine is used to treat anemia caused by a low amount of folic acid in the body. It is also used with 5-fluorouracil (5-FU) to treat colon cancer. ?  This medicine may be used for other purposes; ask your health care provider or pharmacist if you have questions. ?What should I tell my care team before I take this medication? ?They need to know if you have any of these conditions: ?anemia from low levels of vitamin B-12 in the blood ?an unusual or allergic  reaction to leucovorin, folic acid, other medicines, foods, dyes, or preservatives ?pregnant or trying to get pregnant ?breast-feeding ?How should I use this medication? ?This medicine is for injection into a muscle or into a vein. It is given by a health care professional in a hospital or clinic setting. ?Talk to your pediatrician regarding the use of this medicine in children. Special care may be needed. ?Overdosage: If you think you have taken too much of this medicine contact a poison control center or emergency room at once. ?NOTE: This medicine is only for you. Do not share this medicine with others. ?What if I miss a dose? ?This does not apply. ?What may interact with this medication? ?capecitabine ?fluorouracil ?phenobarbital ?phenytoin ?primidone ?trimethoprim-sulfamethoxazole ?This list may not describe all possible interactions. Give your health care provider a list of all the medicines, herbs, non-prescription drugs, or dietary supplements you use. Also tell them if you smoke, drink alcohol, or use illegal drugs. Some items may interact with your medicine. ?What should I watch for while using this medication? ?Your condition will be monitored carefully while you are receiving this medicine. ?This medicine may increase the side effects of 5-fluorouracil, 5-FU. Tell your doctor or health care professional if you have diarrhea or mouth sores that do not get better or that get worse. ?What side effects may I notice from receiving this medication? ?Side effects that you should report to your doctor or health care professional as soon as possible: ?allergic reactions like skin rash, itching or hives, swelling of the face, lips, or tongue ?breathing problems ?fever, infection ?mouth sores ?unusual bleeding or bruising ?unusually weak or tired ?Side effects that usually do not require medical attention (report to your doctor or health care professional if they continue or are bothersome): ?constipation or  diarrhea ?loss of appetite ?nausea, vomiting ?This list may not describe all possible side effects. Call your doctor for medical advice about side effects. You may report side effects to FDA at 1-800-FDA-1088. ?Where should I keep my medication? ?This drug is given in a hospital or clinic and will not be stored at home. ?NOTE: This sheet is a summary. It may not cover all possible information. If you have questions about this medicine, talk to your doctor, pharmacist, or health care provider. ?? 2023 Elsevier/Gold Standard (2007-09-04 00:00:00) ? ?Oxaliplatin Injection ?What is this medication? ?OXALIPLATIN (ox AL i PLA tin) is a chemotherapy drug. It targets fast dividing cells, like cancer cells, and causes these cells to die. This medicine is used to treat cancers of the colon and rectum, and many other cancers. ?This medicine may be used for other purposes; ask your health care provider or pharmacist if you have questions. ?COMMON BRAND NAME(S): Eloxatin ?What should I tell my care team before I take this medication? ?They need to know if you have any of these conditions: ?heart disease ?history of irregular heartbeat ?liver disease ?low blood counts, like white cells, platelets, or red blood cells ?lung or breathing disease, like asthma ?take medicines that treat or prevent blood clots ?tingling of the fingers or toes, or other nerve disorder ?an unusual or allergic reaction to oxaliplatin, other chemotherapy, other  medicines, foods, dyes, or preservatives ?pregnant or trying to get pregnant ?breast-feeding ?How should I use this medication? ?This drug is given as an infusion into a vein. It is administered in a hospital or clinic by a specially trained health care professional. ?Talk to your pediatrician regarding the use of this medicine in children. Special care may be needed. ?Overdosage: If you think you have taken too much of this medicine contact a poison control center or emergency room at once. ?NOTE:  This medicine is only for you. Do not share this medicine with others. ?What if I miss a dose? ?It is important not to miss a dose. Call your doctor or health care professional if you are unable to keep an appoin
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
## 2021-07-18 NOTE — Assessment & Plan Note (Addendum)
#   Stage III colon cancer T3N2-rectosigmoid; distal 5 mm margin; MSS. currently on adjuvant chemo FOLFOX q 2w x12.  ? ?# Proceed with FOLFOX # 5/12 today. Labs today reviewed;  acceptable for treatment today.  Reiterated with the patient the importance of 12 cycles of adjuvant chemotherapy. ? ?#Hematology : neutropenia from the chemotherapy-s/p growth factor support improved; mild thrombocytopenia platelets > 100s.  As needed growth factor support. ? ?# Solitary kidney- GFR- creat- 1.26 [GFR- 60]; recent acute renal failure [JAN 2023-creatinine-5.3]; monitor closely. STABLE.  ? ?# Anemia- 12-13; JAN 2023- IDA-on Venofer.  ? ?# Loose stool in the colostomy bag-s/p colonic surgery.  Awaiting reversal next week see above. ? ?# PN G-1 from oxaliplatin- monitor for now- STABLE.   ? ?# Hx of Afib-eliquis/amiodarone-monitor closely for bleeding tendencies on chemotherapy-STABLE.  ? ?# Travel counseling: Discussed regarding hydration; watch out for infection; also risk of DVT/PE [patient on Eliquis-less likely] ? ?# # IV access:port- Functional.  ? ?# DISPOSITION:  ?# chemo today; pump off D-3.  ?# follow up in 2 weeks- MD; labs-cbc/cmp; FOLFOX; pump off D-3;Dr.B ? ? ? ?

## 2021-07-18 NOTE — Progress Notes (Signed)
Pt states he has a hematoma at his surgery site on his right side that he would like you to look at. ?

## 2021-07-20 ENCOUNTER — Inpatient Hospital Stay: Payer: 59

## 2021-07-20 DIAGNOSIS — Z5111 Encounter for antineoplastic chemotherapy: Secondary | ICD-10-CM | POA: Diagnosis not present

## 2021-07-20 DIAGNOSIS — D509 Iron deficiency anemia, unspecified: Secondary | ICD-10-CM | POA: Diagnosis not present

## 2021-07-20 DIAGNOSIS — D701 Agranulocytosis secondary to cancer chemotherapy: Secondary | ICD-10-CM | POA: Diagnosis not present

## 2021-07-20 DIAGNOSIS — E119 Type 2 diabetes mellitus without complications: Secondary | ICD-10-CM | POA: Diagnosis not present

## 2021-07-20 DIAGNOSIS — C187 Malignant neoplasm of sigmoid colon: Secondary | ICD-10-CM

## 2021-07-20 DIAGNOSIS — I10 Essential (primary) hypertension: Secondary | ICD-10-CM | POA: Diagnosis not present

## 2021-07-20 DIAGNOSIS — C19 Malignant neoplasm of rectosigmoid junction: Secondary | ICD-10-CM | POA: Diagnosis not present

## 2021-07-20 DIAGNOSIS — T451X5A Adverse effect of antineoplastic and immunosuppressive drugs, initial encounter: Secondary | ICD-10-CM | POA: Diagnosis not present

## 2021-07-20 DIAGNOSIS — D6959 Other secondary thrombocytopenia: Secondary | ICD-10-CM | POA: Diagnosis not present

## 2021-07-20 DIAGNOSIS — R5383 Other fatigue: Secondary | ICD-10-CM | POA: Diagnosis not present

## 2021-07-20 MED ORDER — SODIUM CHLORIDE 0.9% FLUSH
10.0000 mL | INTRAVENOUS | Status: DC | PRN
  Administered 2021-07-20: 10 mL
  Filled 2021-07-20: qty 10

## 2021-07-20 MED ORDER — HEPARIN SOD (PORK) LOCK FLUSH 100 UNIT/ML IV SOLN
500.0000 [IU] | Freq: Once | INTRAVENOUS | Status: AC | PRN
  Administered 2021-07-20: 500 [IU]
  Filled 2021-07-20: qty 5

## 2021-07-23 DIAGNOSIS — C189 Malignant neoplasm of colon, unspecified: Secondary | ICD-10-CM | POA: Diagnosis not present

## 2021-07-24 ENCOUNTER — Encounter: Payer: 59 | Admitting: Surgery

## 2021-07-26 ENCOUNTER — Encounter: Payer: Self-pay | Admitting: Surgery

## 2021-07-26 ENCOUNTER — Ambulatory Visit (INDEPENDENT_AMBULATORY_CARE_PROVIDER_SITE_OTHER): Payer: 59 | Admitting: Surgery

## 2021-07-26 VITALS — BP 123/73 | HR 76 | Temp 98.0°F | Wt 237.8 lb

## 2021-07-26 DIAGNOSIS — C187 Malignant neoplasm of sigmoid colon: Secondary | ICD-10-CM

## 2021-07-26 DIAGNOSIS — Z09 Encounter for follow-up examination after completed treatment for conditions other than malignant neoplasm: Secondary | ICD-10-CM

## 2021-07-26 NOTE — Progress Notes (Signed)
Tracy Gardner is a 56 year old following after  ileostomy takedown a month ago.  Tracy Gardner did develop postoperative ileus ?Tracy Gardner now has recovered well.  Tracy Gardner is taking p.o.  No fevers or chills Tracy Gardner recently went to [facility] with his wife. ?Salads seem to bloat him. ?Tracy Gardner went back to chemo sessions ? ?PE NAD ?ABd: Soft nontender no peritonitis.  There is medial edge with some granulation tissue without infection. ? ?A/p doing well without surgical complications. ?Return to clinic in 2 months. ?

## 2021-07-26 NOTE — Patient Instructions (Signed)
If you have any concerns or questions, please feel free to call our office. See follow up appointment below.  ? ?Ileostomy Reversal, Care After ?This sheet gives you information about how to care for yourself after your procedure. Your health care provider may also give you more specific instructions. If you have problems or questions, contact your health care provider. ?What can I expect after the procedure? ?After the procedure, it is common to have: ?A small amount of blood or clear fluid coming from your incision. ?Pain and discomfort in your abdomen, especially near your incision. ?Irregular bowel movements for several days. ?Follow these instructions at home: ?Medicines ?Take over-the-counter and prescription medicines only as told by your health care provider. ?Ask your health care provider if the medicine prescribed to you requires you to avoid driving or using heavy machinery. ?Incision care ? ?Keep your incision area clean and dry. ?Follow instructions from your health care provider about how to take care of your incision. Make sure you: ?Wash your hands with soap and water before and after you change your bandage (dressing). If soap and water are not available, use hand sanitizer. ?Change your dressing as told by your health care provider. ?Leave stitches (sutures), skin glue, or adhesive strips in place. These skin closures may need to stay in place for 2 weeks or longer. If adhesive strip edges start to loosen and curl up, you may trim the loose edges. Do not remove adhesive strips completely unless your health care provider tells you to do that. ?Check your incision area every day for signs of infection. Check for: ?Redness, swelling, or pain. ?Fluid or blood. ?Warmth. ?Pus or a bad smell. ?Eating and drinking ? ?Follow instructions from your health care provider about eating and drinking. ?If you have pain or discomfort after you eat, try eating soft foods until you do not experience these symptoms  any more. ?Eat meals and snacks at regular intervals. ?Drink enough fluid to keep your urine pale yellow. ?Activity ? ?Rest as much as possible while you are healing. ?Return to your normal activities as told by your health care provider. Ask your health care provider what activities are safe for you. ?Avoid intense physical activity for as long as you are told by your health care provider. ?Do not lift anything that is heavier than 10 lb (4.5 kg), or the limit that you are told, for 6 weeks or until your health care provider says that it is safe. ?General instructions ?Wear compression stockings as told by your health care provider. These stockings help to prevent blood clots and reduce swelling in your legs. ?Do not take baths, swim, or use a hot tub until your health care provider approves. Ask your health care provider if you may take showers. You may only be allowed to take sponge baths. ?Do not use any products that contain nicotine or tobacco, such as cigarettes, e-cigarettes, and chewing tobacco. If you need help quitting, ask your health care provider. ?Keep track of your bowel movements. ?Keep all follow-up visits as told by your health care provider. This is important. ?Contact a health care provider if: ?You have redness, swelling, or pain around your incision. ?You have fluid or blood coming from your incision. ?Your incision feels warm to the touch. ?You have pus or a bad smell coming from your incision. ?You have a fever. ?You have abdominal pain, bloating, pressure, or cramping. ?You are having difficulty with bowel movements, such as: ?Diarrhea. ?Constipation. ?Pain during bowel movements. ?  You feel nauseous. ?You vomit. ?You feel dizzy or light-headed. ?You have shortness of breath. ?You have an unusual lack of energy (fatigue). ?Get help right away if you: ?Have abdominal pain or cramps that do not go away with medicine or get worse. ?Vomit more than once. ?Have an irregular heartbeat. ?Have chest  pain. ?Summary ?Take over-the-counter and prescription medicines only as told by your health care provider. ?Keep track of your bowel movements. ?Contact a health care provider if you are having difficulty with bowel movements, such as diarrhea, constipation, or pain during bowel movements. ?Contact a health care provider if you have redness, swelling, or pain around your incision. ?Keep all follow-up visits as told by your health care provider. This is important. ?This information is not intended to replace advice given to you by your health care provider. Make sure you discuss any questions you have with your health care provider. ?Document Revised: 08/30/2020 Document Reviewed: 08/31/2020 ?Elsevier Patient Education ? [place]. ? ?

## 2021-07-31 MED FILL — Dexamethasone Sodium Phosphate Inj 100 MG/10ML: INTRAMUSCULAR | Qty: 1 | Status: AC

## 2021-08-01 ENCOUNTER — Encounter: Payer: Self-pay | Admitting: Internal Medicine

## 2021-08-01 ENCOUNTER — Inpatient Hospital Stay: Payer: 59

## 2021-08-01 ENCOUNTER — Inpatient Hospital Stay (HOSPITAL_BASED_OUTPATIENT_CLINIC_OR_DEPARTMENT_OTHER): Payer: 59 | Admitting: Internal Medicine

## 2021-08-01 DIAGNOSIS — C187 Malignant neoplasm of sigmoid colon: Secondary | ICD-10-CM

## 2021-08-01 DIAGNOSIS — E119 Type 2 diabetes mellitus without complications: Secondary | ICD-10-CM | POA: Diagnosis not present

## 2021-08-01 DIAGNOSIS — D509 Iron deficiency anemia, unspecified: Secondary | ICD-10-CM | POA: Diagnosis not present

## 2021-08-01 DIAGNOSIS — D6959 Other secondary thrombocytopenia: Secondary | ICD-10-CM | POA: Diagnosis not present

## 2021-08-01 DIAGNOSIS — Z5111 Encounter for antineoplastic chemotherapy: Secondary | ICD-10-CM | POA: Diagnosis not present

## 2021-08-01 DIAGNOSIS — T451X5A Adverse effect of antineoplastic and immunosuppressive drugs, initial encounter: Secondary | ICD-10-CM | POA: Diagnosis not present

## 2021-08-01 DIAGNOSIS — I10 Essential (primary) hypertension: Secondary | ICD-10-CM | POA: Diagnosis not present

## 2021-08-01 DIAGNOSIS — C189 Malignant neoplasm of colon, unspecified: Secondary | ICD-10-CM | POA: Diagnosis not present

## 2021-08-01 DIAGNOSIS — C19 Malignant neoplasm of rectosigmoid junction: Secondary | ICD-10-CM | POA: Diagnosis not present

## 2021-08-01 DIAGNOSIS — R5383 Other fatigue: Secondary | ICD-10-CM | POA: Diagnosis not present

## 2021-08-01 DIAGNOSIS — D701 Agranulocytosis secondary to cancer chemotherapy: Secondary | ICD-10-CM | POA: Diagnosis not present

## 2021-08-01 LAB — COMPREHENSIVE METABOLIC PANEL
ALT: 27 U/L (ref 0–44)
AST: 26 U/L (ref 15–41)
Albumin: 3.8 g/dL (ref 3.5–5.0)
Alkaline Phosphatase: 65 U/L (ref 38–126)
Anion gap: 6 (ref 5–15)
BUN: 11 mg/dL (ref 6–20)
CO2: 26 mmol/L (ref 22–32)
Calcium: 8.6 mg/dL — ABNORMAL LOW (ref 8.9–10.3)
Chloride: 105 mmol/L (ref 98–111)
Creatinine, Ser: 1.22 mg/dL (ref 0.61–1.24)
GFR, Estimated: >60 mL/min (ref >60)
Glucose, Bld: 162 mg/dL — ABNORMAL HIGH (ref 70–99)
Potassium: 4 mmol/L (ref 3.5–5.1)
Sodium: 137 mmol/L (ref 135–145)
Total Bilirubin: 0.9 mg/dL (ref 0.3–1.2)
Total Protein: 7.6 g/dL (ref 6.5–8.1)

## 2021-08-01 LAB — CBC WITH DIFFERENTIAL/PLATELET
Abs Immature Granulocytes: 0.01 10*3/uL (ref 0.00–0.07)
Basophils Absolute: 0 10*3/uL (ref 0.0–0.1)
Basophils Relative: 1 %
Eosinophils Absolute: 0.2 10*3/uL (ref 0.0–0.5)
Eosinophils Relative: 6 %
HCT: 42.9 % (ref 39.0–52.0)
Hemoglobin: 14.3 g/dL (ref 13.0–17.0)
Immature Granulocytes: 0 %
Lymphocytes Relative: 29 %
Lymphs Abs: 0.8 10*3/uL (ref 0.7–4.0)
MCH: 29.9 pg (ref 26.0–34.0)
MCHC: 33.3 g/dL (ref 30.0–36.0)
MCV: 89.7 fL (ref 80.0–100.0)
Monocytes Absolute: 0.3 10*3/uL (ref 0.1–1.0)
Monocytes Relative: 13 %
Neutro Abs: 1.4 10*3/uL — ABNORMAL LOW (ref 1.7–7.7)
Neutrophils Relative %: 51 %
Platelets: 77 10*3/uL — ABNORMAL LOW (ref 150–400)
RBC: 4.78 MIL/uL (ref 4.22–5.81)
RDW: 16.2 % — ABNORMAL HIGH (ref 11.5–15.5)
WBC: 2.7 10*3/uL — ABNORMAL LOW (ref 4.0–10.5)
nRBC: 0 % (ref 0.0–0.2)

## 2021-08-01 MED ORDER — LEUCOVORIN CALCIUM INJECTION 350 MG
950.0000 mg | Freq: Once | INTRAVENOUS | Status: AC
  Administered 2021-08-01: 950 mg via INTRAVENOUS
  Filled 2021-08-01: qty 47.5

## 2021-08-01 MED ORDER — SODIUM CHLORIDE 0.9 % IV SOLN
10.0000 mg | Freq: Once | INTRAVENOUS | Status: AC
  Administered 2021-08-01: 10 mg via INTRAVENOUS
  Filled 2021-08-01: qty 10

## 2021-08-01 MED ORDER — OXALIPLATIN CHEMO INJECTION 100 MG/20ML
60.0000 mg/m2 | Freq: Once | INTRAVENOUS | Status: AC
  Administered 2021-08-01: 140 mg via INTRAVENOUS
  Filled 2021-08-01: qty 20

## 2021-08-01 MED ORDER — SODIUM CHLORIDE 0.9 % IV SOLN
2400.0000 mg/m2 | INTRAVENOUS | Status: DC
  Administered 2021-08-01: 5600 mg via INTRAVENOUS
  Filled 2021-08-01: qty 112

## 2021-08-01 MED ORDER — DEXTROSE 5 % IV SOLN
Freq: Once | INTRAVENOUS | Status: AC
  Filled 2021-08-01: qty 250

## 2021-08-01 MED ORDER — PALONOSETRON HCL INJECTION 0.25 MG/5ML
0.2500 mg | Freq: Once | INTRAVENOUS | Status: AC
  Administered 2021-08-01: 0.25 mg via INTRAVENOUS
  Filled 2021-08-01: qty 5
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
## 2021-08-01 NOTE — Assessment & Plan Note (Addendum)
#   Stage III colon cancer T3N2-rectosigmoid; distal 5 mm margin; MSS. currently on adjuvant chemo FOLFOX q 2w x12.   # Proceed with FOLFOX # 6/12 today [reduce ox to '60mg'$ /m2-ANC 1.4 platelets-77]. Labs today reviewed;  acceptable for treatment today.    #Hematology :1.4  neutropenia from the chemotherapy;  mild thrombocytopenia platelets- 77.  Add ziextenzo. And rec-check platelets in 1 week as pt on Eliquis.   # Solitary kidney- GFR- creat- 1.26 [GFR- 60]; recent acute renal failure [JAN 2023-creatinine-5.3]; monitor closely. STABLE.   # Anemia- 12-13; JAN 2023- IDA-on Venofer. STABLE.   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    # Hx of Afib-eliquis/amiodarone-monitor closely for bleeding tendencies on chemotherapy-STABLE.   #  IV access:port- Functional.   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  Await labs in the interim/ next week.   # DISPOSITION:  # chemo today; pump off D-3 # add D-3 Ziextenzo.  # follow up in 1 week- NP; labs- cbc/bmp;  # follow up in 2 weeks- MD; labs-cbc/cmp; FOLFOX; D-3 pump off; ziextenzo-;Dr.B

## 2021-08-01 NOTE — Progress Notes (Signed)
Pt states he needs a letter to release him to be able to return to work.

## 2021-08-02 ENCOUNTER — Encounter: Payer: Self-pay | Admitting: Internal Medicine

## 2021-08-03 ENCOUNTER — Other Ambulatory Visit: Payer: Self-pay

## 2021-08-03 ENCOUNTER — Inpatient Hospital Stay: Payer: 59

## 2021-08-03 VITALS — BP 121/76 | HR 76 | Resp 18

## 2021-08-03 DIAGNOSIS — R5383 Other fatigue: Secondary | ICD-10-CM | POA: Diagnosis not present

## 2021-08-03 DIAGNOSIS — Z5111 Encounter for antineoplastic chemotherapy: Secondary | ICD-10-CM | POA: Diagnosis not present

## 2021-08-03 DIAGNOSIS — I10 Essential (primary) hypertension: Secondary | ICD-10-CM | POA: Diagnosis not present

## 2021-08-03 DIAGNOSIS — T451X5A Adverse effect of antineoplastic and immunosuppressive drugs, initial encounter: Secondary | ICD-10-CM | POA: Diagnosis not present

## 2021-08-03 DIAGNOSIS — D701 Agranulocytosis secondary to cancer chemotherapy: Secondary | ICD-10-CM | POA: Diagnosis not present

## 2021-08-03 DIAGNOSIS — C19 Malignant neoplasm of rectosigmoid junction: Secondary | ICD-10-CM | POA: Diagnosis not present

## 2021-08-03 DIAGNOSIS — D6959 Other secondary thrombocytopenia: Secondary | ICD-10-CM | POA: Diagnosis not present

## 2021-08-03 DIAGNOSIS — E119 Type 2 diabetes mellitus without complications: Secondary | ICD-10-CM | POA: Diagnosis not present

## 2021-08-03 DIAGNOSIS — C187 Malignant neoplasm of sigmoid colon: Secondary | ICD-10-CM

## 2021-08-03 DIAGNOSIS — D509 Iron deficiency anemia, unspecified: Secondary | ICD-10-CM | POA: Diagnosis not present

## 2021-08-03 MED ORDER — SODIUM CHLORIDE 0.9% FLUSH
10.0000 mL | INTRAVENOUS | Status: DC | PRN
  Administered 2021-08-03: 10 mL
  Filled 2021-08-03: qty 10

## 2021-08-03 MED ORDER — HEPARIN SOD (PORK) LOCK FLUSH 100 UNIT/ML IV SOLN
500.0000 [IU] | Freq: Once | INTRAVENOUS | Status: AC | PRN
  Administered 2021-08-03: 500 [IU]
  Filled 2021-08-03: qty 5

## 2021-08-03 MED ORDER — PEGFILGRASTIM-BMEZ 6 MG/0.6ML ~~LOC~~ SOSY
6.0000 mg | PREFILLED_SYRINGE | Freq: Once | SUBCUTANEOUS | Status: AC
  Administered 2021-08-03: 6 mg via SUBCUTANEOUS
  Filled 2021-08-03: qty 0.6

## 2021-08-08 ENCOUNTER — Inpatient Hospital Stay: Payer: 59

## 2021-08-08 ENCOUNTER — Inpatient Hospital Stay (HOSPITAL_BASED_OUTPATIENT_CLINIC_OR_DEPARTMENT_OTHER): Payer: 59 | Admitting: Medical Oncology

## 2021-08-08 VITALS — BP 126/72 | HR 68 | Temp 96.6°F | Resp 16 | Wt 242.0 lb

## 2021-08-08 DIAGNOSIS — D701 Agranulocytosis secondary to cancer chemotherapy: Secondary | ICD-10-CM

## 2021-08-08 DIAGNOSIS — C187 Malignant neoplasm of sigmoid colon: Secondary | ICD-10-CM

## 2021-08-08 DIAGNOSIS — I10 Essential (primary) hypertension: Secondary | ICD-10-CM | POA: Diagnosis not present

## 2021-08-08 DIAGNOSIS — R7309 Other abnormal glucose: Secondary | ICD-10-CM

## 2021-08-08 DIAGNOSIS — C19 Malignant neoplasm of rectosigmoid junction: Secondary | ICD-10-CM | POA: Diagnosis not present

## 2021-08-08 DIAGNOSIS — D509 Iron deficiency anemia, unspecified: Secondary | ICD-10-CM | POA: Diagnosis not present

## 2021-08-08 DIAGNOSIS — R5383 Other fatigue: Secondary | ICD-10-CM | POA: Diagnosis not present

## 2021-08-08 DIAGNOSIS — T451X5A Adverse effect of antineoplastic and immunosuppressive drugs, initial encounter: Secondary | ICD-10-CM | POA: Diagnosis not present

## 2021-08-08 DIAGNOSIS — Z5111 Encounter for antineoplastic chemotherapy: Secondary | ICD-10-CM | POA: Diagnosis not present

## 2021-08-08 DIAGNOSIS — D500 Iron deficiency anemia secondary to blood loss (chronic): Secondary | ICD-10-CM | POA: Diagnosis not present

## 2021-08-08 DIAGNOSIS — E119 Type 2 diabetes mellitus without complications: Secondary | ICD-10-CM | POA: Diagnosis not present

## 2021-08-08 DIAGNOSIS — D6959 Other secondary thrombocytopenia: Secondary | ICD-10-CM

## 2021-08-08 DIAGNOSIS — R739 Hyperglycemia, unspecified: Secondary | ICD-10-CM

## 2021-08-08 LAB — COMPREHENSIVE METABOLIC PANEL
ALT: 36 U/L (ref 0–44)
AST: 26 U/L (ref 15–41)
Albumin: 3.9 g/dL (ref 3.5–5.0)
Alkaline Phosphatase: 92 U/L (ref 38–126)
Anion gap: 7 (ref 5–15)
BUN: 13 mg/dL (ref 6–20)
CO2: 24 mmol/L (ref 22–32)
Calcium: 8.4 mg/dL — ABNORMAL LOW (ref 8.9–10.3)
Chloride: 104 mmol/L (ref 98–111)
Creatinine, Ser: 1.1 mg/dL (ref 0.61–1.24)
GFR, Estimated: >60 mL/min (ref >60)
Glucose, Bld: 198 mg/dL — ABNORMAL HIGH (ref 70–99)
Potassium: 3.9 mmol/L (ref 3.5–5.1)
Sodium: 135 mmol/L (ref 135–145)
Total Bilirubin: 0.2 mg/dL — ABNORMAL LOW (ref 0.3–1.2)
Total Protein: 7.1 g/dL (ref 6.5–8.1)

## 2021-08-08 LAB — CBC WITH DIFFERENTIAL/PLATELET
Abs Immature Granulocytes: 0.05 10*3/uL (ref 0.00–0.07)
Basophils Absolute: 0 10*3/uL (ref 0.0–0.1)
Basophils Relative: 0 %
Eosinophils Absolute: 0.2 10*3/uL (ref 0.0–0.5)
Eosinophils Relative: 2 %
HCT: 42.1 % (ref 39.0–52.0)
Hemoglobin: 14.1 g/dL (ref 13.0–17.0)
Immature Granulocytes: 1 %
Lymphocytes Relative: 14 %
Lymphs Abs: 0.9 10*3/uL (ref 0.7–4.0)
MCH: 29.9 pg (ref 26.0–34.0)
MCHC: 33.5 g/dL (ref 30.0–36.0)
MCV: 89.4 fL (ref 80.0–100.0)
Monocytes Absolute: 0.8 10*3/uL (ref 0.1–1.0)
Monocytes Relative: 13 %
Neutro Abs: 4.5 10*3/uL (ref 1.7–7.7)
Neutrophils Relative %: 70 %
Platelets: 92 10*3/uL — ABNORMAL LOW (ref 150–400)
RBC: 4.71 MIL/uL (ref 4.22–5.81)
RDW: 15.9 % — ABNORMAL HIGH (ref 11.5–15.5)
Smear Review: NORMAL
WBC: 6.5 10*3/uL (ref 4.0–10.5)
nRBC: 0 % (ref 0.0–0.2)

## 2021-08-08 MED ORDER — HEPARIN SOD (PORK) LOCK FLUSH 100 UNIT/ML IV SOLN
500.0000 [IU] | Freq: Once | INTRAVENOUS | Status: AC
  Administered 2021-08-08: 500 [IU] via INTRAVENOUS
  Filled 2021-08-08: qty 5

## 2021-08-08 MED ORDER — SODIUM CHLORIDE 0.9% FLUSH
10.0000 mL | Freq: Once | INTRAVENOUS | Status: AC
  Administered 2021-08-08: 10 mL via INTRAVENOUS
  Filled 2021-08-08: qty 10

## 2021-08-08 NOTE — Progress Notes (Signed)
Returns for follow-up. Reports headaches on and off, otherwise, feels that he is tolerating treatments well and has no further concerns today.
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
## 2021-08-10 ENCOUNTER — Other Ambulatory Visit: Payer: Self-pay | Admitting: *Deleted

## 2021-08-10 NOTE — Patient Outreach (Signed)
[facility]) Care Management Geriatric Nurse Practitioner Note   08/10/2021 Name:  Tracy Gardner MRN:  [id number] DOB:  08-29-1965  Summary: Pt will resume chemo on 08/15/21. Feels great.  Recommendations/Changes made from today's visit: Follow oncolgy schedule and recommendations. Call MD or NP if any problems or questions.  Subjective: Tracy Gardner is an 56 y.o. year old male who is a primary patient of Tracy Rossetti, PA-C. The care management team was consulted for assistance with care management and/or care coordination needs.    Geriatric Nurse Practitioner completed Telephone Visit today.   Objective:  Medications Reviewed Today     Reviewed by Tracy Gardner (Physician Assistant Certified) on 76/72/09 at [place] List Status: <None>   Medication Order Taking? Sig Documenting Provider Last Dose Status Informant  acetaminophen (TYLENOL) 500 MG tablet [id number] No Take 1,000 mg by mouth every 6 (six) hours as needed for mild pain. [provider] Taking Active Spouse/Significant Other  albuterol (VENTOLIN HFA) 108 (90 Base) MCG/ACT inhaler [id number] No Inhale 2 puffs into the lungs 4 times a day as needed for shortness of breath, wheezing and cough  Taking Active Spouse/Significant Other  amiodarone (PACERONE) 200 MG tablet [id number] No Take 1 tablet (200 mg total) by mouth daily. Hold for HR less then 50  Patient taking differently: Take 200 mg by mouth every morning. Hold for HR less then 50   Gardner, Tracy November, MD Taking Active Spouse/Significant Other  apixaban (ELIQUIS) 5 MG TABS tablet [id number] No Take 5 mg by mouth 2 (two) times daily. [provider] Taking Active   blood glucose meter kit and supplies KIT [id number] No #Check blood glucose fasting/in the morning; after lunch; and after dinner. Tracy Sickle, MD Taking Active Spouse/Significant Other  Blood Pressure Monitoring (OMRON 3 SERIES BP MONITOR) DEVI  [id number] No Use as directed Tracy Sickle, MD Taking Active Spouse/Significant Other  cetirizine (ZYRTEC) 10 MG tablet [id number] No Take 10 mg by mouth every morning. [provider] Taking Active Spouse/Significant Other  diphenhydrAMINE (SOMINEX) 25 MG tablet [id number] No Take by mouth. [provider] Taking Active   doxylamine, Sleep, (UNISOM) 25 MG tablet [id number] No Take 25 mg by mouth at bedtime as needed. [provider] Taking Active Spouse/Significant Other  empagliflozin (JARDIANCE) 10 MG TABS tablet [id number] No Take 1 tablet (10 mg total) by mouth daily.  Taking Active Spouse/Significant Other           Med Note Tracy Gardner   Mon May 01, 2021  2:16 PM)    fluticasone (FLOVENT HFA) 110 MCG/ACT inhaler [id number] No Inhale into the lungs 2 (two) times daily.  Patient taking differently: Inhale 1 puff into the lungs 2 (two) times daily as needed (shortness of breath).    Taking Active Spouse/Significant Other  glucose blood (FREESTYLE LITE) test strip [id number] No Use as directed to test 3 times daily  Taking Active Spouse/Significant Other  Lancets (FREESTYLE) lancets [id number] No #Check blood glucose fasting/in the morning; after lunch; and after dinner. Tracy Sickle, MD Taking Active Spouse/Significant Other  lidocaine-prilocaine (EMLA) cream [id number] No Apply to affected area once Tracy Sickle, MD Taking Active Spouse/Significant Other  loperamide (IMODIUM A-D) 2 MG tablet [id number] No Take 1 tablet (2 mg total) by mouth 4 (four) times daily as needed for diarrhea or loose stools. Tracy Pert, MD Taking Active Spouse/Significant Other  Multiple Vitamins-Minerals (MULTIVITAMIN WITH MINERALS) tablet [id number] No Take  1 tablet by mouth daily. [provider] Taking Active Spouse/Significant Other  prochlorperazine (COMPAZINE) 10 MG tablet [id number] No Take 1 tablet (10 mg total) by mouth every 6 (six) hours as needed  for nausea or vomiting. Tracy Sickle, MD Taking Active Spouse/Significant Other  rosuvastatin (CRESTOR) 10 MG tablet [id number] No Take 1 tablet (10 mg total) by mouth every evening. Tracy Merritts, MD Taking Active Spouse/Significant Other            Care Plan  Review of patient past medical history, allergies, medications, health status, including review of consultants reports, laboratory and other test data, was performed as part of comprehensive evaluation for care management services.   Care Plan : [place] Management Plan of Care  Updates made by Tracy Lair, NP since 08/10/2021 12:00 AM     Problem: Adenocarcinoma of colon   Priority: High  Onset Date: 03/29/2021     Goal: Patient will resume his chemotherapy over the next 30 days.   Start Date: 07/13/2021  Expected End Date: 08/10/2021  This Visit's Progress: On track  Priority: High  Note:    Update 08/10/21:  (Status: Goal on Track (progressing): YES.) Short Term Goal  Evaluation of current treatment plan related to [place] RESUMING and patient's adherence to plan as established by provider Tracy Gardner reports his platlet count is back up and he plans to resume his therapy oon 08/15/21. He says he has 4 courses left. He feels "better than I have in several years now" "I hope the doc will let me go back to work before my chemo is finished." We discussed our plan to delay our next conversation until the end of August when his tx will be complete.  Current Barriers:  No barriers   RNCM Clinical Goal(s):  Patient will verbalize understanding of plan for management of Adenocarcinoma as evidenced by pt report. demonstrate ongoing health management independence as evidenced by pt report and chart review.        through collaboration with RN Care manager, provider, and care team.   Interventions: Inter-disciplinary care team collaboration (see longitudinal plan of care) Evaluation of current treatment plan related  to  self management and patient's adherence to plan as established by provider Reviewed with pt his oncology plan of care. He will have 5-6 more chemotherapy treatments. Starting May 9th. Reinforced importance of calling when there is any problem or he has any questions.  Patient Goals/Self-Care Activities: Attend all scheduled provider appointments         Long-Range Goal: Patient will complete his chemotherapy by the end of August per pt report and chart review.   Start Date: 08/10/2021  Expected End Date: 11/09/2021  Priority: High  Note:   Current Barriers:  No adjustable barriers (platleet count variance by delay completion by Aug. 31st)  RNCM Clinical Goal(s):  Patient will take all medications exactly as prescribed and will call provider for medication related questions as evidenced by Pt report and chart review.    attend all scheduled medical appointments: Oncology as evidenced by pt report and chart review        through collaboration with RN Care manager, provider, and care team.   Interventions: Inter-disciplinary care team collaboration (see longitudinal plan of care) Evaluation of current treatment plan related to  self management and patient's adherence to plan as established by provider  Patient Goals/Self-Care Activities: Take medications as prescribed   Attend all scheduled provider appointments Call pharmacy for medication refills  3-7 days in advance of running out of medications Call provider office for new concerns or questions         Plan: Telephone follow up appointment with care management team member scheduled for:  Aug 31st. Pt agrees to new plan of care.  Eulah Pont. Myrtie Neither, MSN, [facility] Gerontological Nurse Practitioner [facility] Care Management [phone]

## 2021-08-14 MED FILL — Dexamethasone Sodium Phosphate Inj 100 MG/10ML: INTRAMUSCULAR | Qty: 1 | Status: AC

## 2021-08-15 ENCOUNTER — Inpatient Hospital Stay: Payer: 59

## 2021-08-15 ENCOUNTER — Encounter: Payer: Self-pay | Admitting: Internal Medicine

## 2021-08-15 ENCOUNTER — Inpatient Hospital Stay: Payer: 59 | Attending: Internal Medicine | Admitting: Internal Medicine

## 2021-08-15 DIAGNOSIS — D751 Secondary polycythemia: Secondary | ICD-10-CM | POA: Insufficient documentation

## 2021-08-15 DIAGNOSIS — Z5111 Encounter for antineoplastic chemotherapy: Secondary | ICD-10-CM | POA: Insufficient documentation

## 2021-08-15 DIAGNOSIS — C187 Malignant neoplasm of sigmoid colon: Secondary | ICD-10-CM | POA: Diagnosis not present

## 2021-08-15 DIAGNOSIS — Z87891 Personal history of nicotine dependence: Secondary | ICD-10-CM | POA: Insufficient documentation

## 2021-08-15 DIAGNOSIS — Z905 Acquired absence of kidney: Secondary | ICD-10-CM | POA: Insufficient documentation

## 2021-08-15 DIAGNOSIS — I2510 Atherosclerotic heart disease of native coronary artery without angina pectoris: Secondary | ICD-10-CM | POA: Insufficient documentation

## 2021-08-15 DIAGNOSIS — Z8249 Family history of ischemic heart disease and other diseases of the circulatory system: Secondary | ICD-10-CM | POA: Diagnosis not present

## 2021-08-15 DIAGNOSIS — E1122 Type 2 diabetes mellitus with diabetic chronic kidney disease: Secondary | ICD-10-CM | POA: Diagnosis not present

## 2021-08-15 DIAGNOSIS — I429 Cardiomyopathy, unspecified: Secondary | ICD-10-CM | POA: Diagnosis not present

## 2021-08-15 DIAGNOSIS — R200 Anesthesia of skin: Secondary | ICD-10-CM | POA: Diagnosis not present

## 2021-08-15 DIAGNOSIS — D6959 Other secondary thrombocytopenia: Secondary | ICD-10-CM | POA: Diagnosis not present

## 2021-08-15 DIAGNOSIS — Z833 Family history of diabetes mellitus: Secondary | ICD-10-CM | POA: Diagnosis not present

## 2021-08-15 DIAGNOSIS — Z5189 Encounter for other specified aftercare: Secondary | ICD-10-CM | POA: Insufficient documentation

## 2021-08-15 DIAGNOSIS — G4730 Sleep apnea, unspecified: Secondary | ICD-10-CM | POA: Diagnosis not present

## 2021-08-15 DIAGNOSIS — C779 Secondary and unspecified malignant neoplasm of lymph node, unspecified: Secondary | ICD-10-CM | POA: Insufficient documentation

## 2021-08-15 DIAGNOSIS — N183 Chronic kidney disease, stage 3 unspecified: Secondary | ICD-10-CM | POA: Diagnosis not present

## 2021-08-15 DIAGNOSIS — Z79899 Other long term (current) drug therapy: Secondary | ICD-10-CM | POA: Diagnosis not present

## 2021-08-15 DIAGNOSIS — Z87442 Personal history of urinary calculi: Secondary | ICD-10-CM | POA: Diagnosis not present

## 2021-08-15 DIAGNOSIS — I4819 Other persistent atrial fibrillation: Secondary | ICD-10-CM | POA: Diagnosis not present

## 2021-08-15 DIAGNOSIS — R202 Paresthesia of skin: Secondary | ICD-10-CM | POA: Diagnosis not present

## 2021-08-15 DIAGNOSIS — Z7901 Long term (current) use of anticoagulants: Secondary | ICD-10-CM | POA: Insufficient documentation

## 2021-08-15 DIAGNOSIS — C189 Malignant neoplasm of colon, unspecified: Secondary | ICD-10-CM | POA: Diagnosis not present

## 2021-08-15 DIAGNOSIS — I129 Hypertensive chronic kidney disease with stage 1 through stage 4 chronic kidney disease, or unspecified chronic kidney disease: Secondary | ICD-10-CM | POA: Insufficient documentation

## 2021-08-15 DIAGNOSIS — R5383 Other fatigue: Secondary | ICD-10-CM | POA: Diagnosis not present

## 2021-08-15 LAB — COMPREHENSIVE METABOLIC PANEL
ALT: 27 U/L (ref 0–44)
AST: 27 U/L (ref 15–41)
Albumin: 3.9 g/dL (ref 3.5–5.0)
Alkaline Phosphatase: 85 U/L (ref 38–126)
Anion gap: 7 (ref 5–15)
BUN: 17 mg/dL (ref 6–20)
CO2: 25 mmol/L (ref 22–32)
Calcium: 8.7 mg/dL — ABNORMAL LOW (ref 8.9–10.3)
Chloride: 104 mmol/L (ref 98–111)
Creatinine, Ser: 1.25 mg/dL — ABNORMAL HIGH (ref 0.61–1.24)
GFR, Estimated: >60 mL/min (ref >60)
Glucose, Bld: 129 mg/dL — ABNORMAL HIGH (ref 70–99)
Potassium: 3.7 mmol/L (ref 3.5–5.1)
Sodium: 136 mmol/L (ref 135–145)
Total Bilirubin: 0.6 mg/dL (ref 0.3–1.2)
Total Protein: 7.6 g/dL (ref 6.5–8.1)

## 2021-08-15 LAB — CBC WITH DIFFERENTIAL/PLATELET
Abs Immature Granulocytes: 0.48 10*3/uL — ABNORMAL HIGH (ref 0.00–0.07)
Basophils Absolute: 0 10*3/uL (ref 0.0–0.1)
Basophils Relative: 1 %
Eosinophils Absolute: 0.1 10*3/uL (ref 0.0–0.5)
Eosinophils Relative: 1 %
HCT: 45.7 % (ref 39.0–52.0)
Hemoglobin: 15.2 g/dL (ref 13.0–17.0)
Immature Granulocytes: 6 %
Lymphocytes Relative: 14 %
Lymphs Abs: 1.1 10*3/uL (ref 0.7–4.0)
MCH: 30 pg (ref 26.0–34.0)
MCHC: 33.3 g/dL (ref 30.0–36.0)
MCV: 90.1 fL (ref 80.0–100.0)
Monocytes Absolute: 0.6 10*3/uL (ref 0.1–1.0)
Monocytes Relative: 7 %
Neutro Abs: 5.6 10*3/uL (ref 1.7–7.7)
Neutrophils Relative %: 71 %
Platelets: 96 10*3/uL — ABNORMAL LOW (ref 150–400)
RBC: 5.07 MIL/uL (ref 4.22–5.81)
RDW: 16.3 % — ABNORMAL HIGH (ref 11.5–15.5)
Smear Review: NORMAL
WBC: 7.8 10*3/uL (ref 4.0–10.5)
nRBC: 0 % (ref 0.0–0.2)

## 2021-08-15 MED ORDER — HEPARIN SOD (PORK) LOCK FLUSH 100 UNIT/ML IV SOLN
500.0000 [IU] | Freq: Once | INTRAVENOUS | Status: DC
  Filled 2021-08-15: qty 5

## 2021-08-15 MED ORDER — DEXTROSE 5 % IV SOLN
Freq: Once | INTRAVENOUS | Status: AC
  Filled 2021-08-15: qty 250

## 2021-08-15 MED ORDER — OXALIPLATIN CHEMO INJECTION 100 MG/20ML
60.0000 mg/m2 | Freq: Once | INTRAVENOUS | Status: AC
  Administered 2021-08-15: 140 mg via INTRAVENOUS
  Filled 2021-08-15: qty 20

## 2021-08-15 MED ORDER — LEUCOVORIN CALCIUM INJECTION 350 MG
950.0000 mg | Freq: Once | INTRAVENOUS | Status: AC
  Administered 2021-08-15: 950 mg via INTRAVENOUS
  Filled 2021-08-15: qty 25

## 2021-08-15 MED ORDER — SODIUM CHLORIDE 0.9 % IV SOLN
10.0000 mg | Freq: Once | INTRAVENOUS | Status: AC
  Administered 2021-08-15: 10 mg via INTRAVENOUS
  Filled 2021-08-15: qty 10

## 2021-08-15 MED ORDER — PALONOSETRON HCL INJECTION 0.25 MG/5ML
0.2500 mg | Freq: Once | INTRAVENOUS | Status: AC
  Administered 2021-08-15: 0.25 mg via INTRAVENOUS
  Filled 2021-08-15: qty 5

## 2021-08-15 MED ORDER — SODIUM CHLORIDE 0.9 % IV SOLN
2400.0000 mg/m2 | INTRAVENOUS | Status: DC
  Administered 2021-08-15: 5600 mg via INTRAVENOUS
  Filled 2021-08-15: qty 112

## 2021-08-15 MED ORDER — SODIUM CHLORIDE 0.9% FLUSH
10.0000 mL | Freq: Once | INTRAVENOUS | Status: AC
  Administered 2021-08-15: 10 mL via INTRAVENOUS
  Filled 2021-08-15: qty 10
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
## 2021-08-15 NOTE — Assessment & Plan Note (Addendum)
#   Stage III colon cancer T3N2-rectosigmoid; distal 5 mm margin; MSS. currently on adjuvant chemo FOLFOX q 2w x12.   # Proceed with FOLFOX # 7/12 today. Labs today reviewed;  acceptable for treatment today.D-3 ziextenzo.     # Solitary kidney- GFR- creat- 1.26 [GFR- 60]; recent acute renal failure [JAN 2023-creatinine-5.3]; monitor closely. STABLE.    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    # Hx of Afib-eliquis/amiodarone-monitor closely for bleeding tendencies on chemotherapy- .   STABLE  #  IV access:port- Functional.   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   # DISPOSITION:  # chemo today; pump off D-3 # follow up in 2 weeks- MD; labs-cbc/cmp; FOLFOX; D-3 pump off; ziextenzo-;Dr.B

## 2021-08-15 NOTE — Progress Notes (Signed)
Pt states he thinks he may have gotten a blockage trying to eat cole slaw that was really painful but has cleared up.  Having some night sweats. Is this a symptom of the treatment or cancer?

## 2021-08-17 ENCOUNTER — Inpatient Hospital Stay: Payer: 59

## 2021-08-17 ENCOUNTER — Ambulatory Visit: Payer: 59

## 2021-08-17 DIAGNOSIS — R200 Anesthesia of skin: Secondary | ICD-10-CM | POA: Diagnosis not present

## 2021-08-17 DIAGNOSIS — R202 Paresthesia of skin: Secondary | ICD-10-CM | POA: Diagnosis not present

## 2021-08-17 DIAGNOSIS — Z5111 Encounter for antineoplastic chemotherapy: Secondary | ICD-10-CM | POA: Diagnosis not present

## 2021-08-17 DIAGNOSIS — C187 Malignant neoplasm of sigmoid colon: Secondary | ICD-10-CM | POA: Diagnosis not present

## 2021-08-17 DIAGNOSIS — D6959 Other secondary thrombocytopenia: Secondary | ICD-10-CM | POA: Diagnosis not present

## 2021-08-17 DIAGNOSIS — D751 Secondary polycythemia: Secondary | ICD-10-CM | POA: Diagnosis not present

## 2021-08-17 DIAGNOSIS — C779 Secondary and unspecified malignant neoplasm of lymph node, unspecified: Secondary | ICD-10-CM | POA: Diagnosis not present

## 2021-08-17 DIAGNOSIS — Z5189 Encounter for other specified aftercare: Secondary | ICD-10-CM | POA: Diagnosis not present

## 2021-08-17 DIAGNOSIS — R5383 Other fatigue: Secondary | ICD-10-CM | POA: Diagnosis not present

## 2021-08-17 MED ORDER — SODIUM CHLORIDE 0.9% FLUSH
10.0000 mL | INTRAVENOUS | Status: DC | PRN
  Administered 2021-08-17: 10 mL
  Filled 2021-08-17: qty 10

## 2021-08-17 MED ORDER — HEPARIN SOD (PORK) LOCK FLUSH 100 UNIT/ML IV SOLN
500.0000 [IU] | Freq: Once | INTRAVENOUS | Status: AC | PRN
  Administered 2021-08-17: 500 [IU]
  Filled 2021-08-17: qty 5

## 2021-08-17 MED ORDER — PEGFILGRASTIM-BMEZ 6 MG/0.6ML ~~LOC~~ SOSY
6.0000 mg | PREFILLED_SYRINGE | Freq: Once | SUBCUTANEOUS | Status: AC
  Administered 2021-08-17: 6 mg via SUBCUTANEOUS

## 2021-08-17 NOTE — Patient Instructions (Signed)
[facility]CER CTR AT [place]  Discharge Instructions: Thank you for choosing [facility] to provide your oncology and hematology care.  If you have a lab appointment with the [facility], please go directly to the [facility] and check in at the registration area.  Wear comfortable clothing and clothing appropriate for easy access to any Portacath or PICC line.   We strive to give you quality time with your provider. You may need to reschedule your appointment if you arrive late (15 or more minutes).  Arriving late affects you and other patients whose appointments are after yours.  Also, if you miss three or more appointments without notifying the office, you may be dismissed from the clinic at the provider's discretion.      For prescription refill requests, have your pharmacy contact our office and allow 72 hours for refills to be completed.    Today you received the following chemotherapy and/or immunotherapy agents pump dc and ZIEXTENZO      To help prevent nausea and vomiting after your treatment, we encourage you to take your nausea medication as directed.  BELOW ARE SYMPTOMS THAT SHOULD BE REPORTED IMMEDIATELY: *FEVER GREATER THAN 100.4 F (38 C) OR HIGHER *CHILLS OR SWEATING *NAUSEA AND VOMITING THAT IS NOT CONTROLLED WITH YOUR NAUSEA MEDICATION *UNUSUAL SHORTNESS OF BREATH *UNUSUAL BRUISING OR BLEEDING *URINARY PROBLEMS (pain or burning when urinating, or frequent urination) *BOWEL PROBLEMS (unusual diarrhea, constipation, pain near the anus) TENDERNESS IN MOUTH AND THROAT WITH OR WITHOUT PRESENCE OF ULCERS (sore throat, sores in mouth, or a toothache) UNUSUAL RASH, SWELLING OR PAIN  UNUSUAL VAGINAL DISCHARGE OR ITCHING   Items with * indicate a potential emergency and should be followed up as soon as possible or go to the Emergency Department if any problems should occur.  Please show the CHEMOTHERAPY ALERT CARD or IMMUNOTHERAPY ALERT CARD at  check-in to the Emergency Department and triage nurse.  Should you have questions after your visit or need to cancel or reschedule your appointment, please contact [facility] CANCER [place] AT [place]  [phone] and follow the prompts.  Office hours are 8:00 a.m. to 4:30 p.m. Monday - Friday. Please note that voicemails left after 4:00 p.m. may not be returned until the following business day.  We are closed weekends and major holidays. You have access to a nurse at all times for urgent questions. Please call the main number to the clinic [phone] and follow the prompts.  For any non-urgent questions, you may also contact your provider using MyChart. We now offer e-Visits for anyone 47 and older to request care online for non-urgent symptoms. For details visit [email or web address].   Also download the MyChart app! Go to the app store, search "MyChart", open the app, select [facility], and log in with your MyChart username and password.  Due to Covid, a mask is required upon entering the hospital/clinic. If you do not have a mask, one will be given to you upon arrival. For doctor visits, patients may have 1 support person aged 63 or older with them. For treatment visits, patients cannot have anyone with them due to current Covid guidelines and our immunocompromised population.   Pegfilgrastim Injection What is this medication? PEGFILGRASTIM (PEG fil gra stim) lowers the risk of infection in people who are receiving chemotherapy. It works by Building control surveyor make more white blood cells, which protects your body from infection. It may also be used to help people who have been exposed to  high doses of radiation. This medicine may be used for other purposes; ask your health care provider or pharmacist if you have questions. COMMON BRAND NAME(S): [employer], Neulasta, Nyvepria, Stimufend, UDENYCA, Ziextenzo What should I tell my care team before I take this medication? They need  to know if you have any of these conditions: Kidney disease Latex allergy Ongoing radiation therapy Sickle cell disease Skin reactions to acrylic adhesives (On-Body Injector only) An unusual or allergic reaction to pegfilgrastim, filgrastim, other medications, foods, dyes, or preservatives Pregnant or trying to get pregnant Breast-feeding How should I use this medication? This medication is for injection under the skin. If you get this medication at home, you will be taught how to prepare and give the pre-filled syringe or how to use the On-body Injector. Refer to the patient Instructions for Use for detailed instructions. Use exactly as directed. Tell your care team immediately if you suspect that the On-body Injector may not have performed as intended or if you suspect the use of the On-body Injector resulted in a missed or partial dose. It is important that you put your used needles and syringes in a special sharps container. Do not put them in a trash can. If you do not have a sharps container, call your pharmacist or care team to get one. Talk to your care team about the use of this medication in children. While this medication may be prescribed for selected conditions, precautions do apply. Overdosage: If you think you have taken too much of this medicine contact a poison control center or emergency room at once. NOTE: This medicine is only for you. Do not share this medicine with others. What if I miss a dose? It is important not to miss your dose. Call your care team if you miss your dose. If you miss a dose due to an On-body Injector failure or leakage, a new dose should be administered as soon as possible using a single prefilled syringe for manual use. What may interact with this medication? Interactions have not been studied. This list may not describe all possible interactions. Give your health care provider a list of all the medicines, herbs, non-prescription drugs, or dietary  supplements you use. Also tell them if you smoke, drink alcohol, or use illegal drugs. Some items may interact with your medicine. What should I watch for while using this medication? Your condition will be monitored carefully while you are receiving this medication. You may need blood work done while you are taking this medication. Talk to your care team about your risk of cancer. You may be more at risk for certain types of cancer if you take this medication. If you are going to need a MRI, CT scan, or other procedure, tell your care team that you are using this medication (On-Body Injector only). What side effects may I notice from receiving this medication? Side effects that you should report to your care team as soon as possible: Allergic reactions--skin rash, itching, hives, swelling of the face, lips, tongue, or throat Capillary leak syndrome--stomach or muscle pain, unusual weakness or fatigue, feeling faint or lightheaded, decrease in the amount of urine, swelling of the ankles, hands, or feet, trouble breathing High white blood cell level--fever, fatigue, trouble breathing, night sweats, change in vision, weight loss Inflammation of the aorta--fever, fatigue, back, chest, or stomach pain, severe headache Kidney injury (glomerulonephritis)--decrease in the amount of urine, red or dark brown urine, foamy or bubbly urine, swelling of the ankles, hands, or  feet Shortness of breath or trouble breathing Spleen injury--pain in upper left stomach or shoulder Unusual bruising or bleeding Side effects that usually do not require medical attention (report to your care team if they continue or are bothersome): Bone pain Pain in the hands or feet This list may not describe all possible side effects. Call your doctor for medical advice about side effects. You may report side effects to FDA at 1-800-FDA-1088. Where should I keep my medication? Keep out of the reach of children. If you are using this  medication at home, you will be instructed on how to store it. Throw away any unused medication after the expiration date on the label. NOTE: This sheet is a summary. It may not cover all possible information. If you have questions about this medicine, talk to your doctor, pharmacist, or health care provider.  2023 Elsevier/Gold Standard (2021-01-27 00:00:00)

## 2021-08-21 ENCOUNTER — Other Ambulatory Visit: Payer: Self-pay

## 2021-08-21 ENCOUNTER — Other Ambulatory Visit: Payer: Self-pay | Admitting: Cardiovascular Disease

## 2021-08-21 DIAGNOSIS — I480 Paroxysmal atrial fibrillation: Secondary | ICD-10-CM

## 2021-08-21 MED ORDER — APIXABAN 5 MG PO TABS
ORAL_TABLET | ORAL | 1 refills | Status: DC
  Filled 2021-08-21: qty 180, 90d supply, fill #0
  Filled 2021-11-22: qty 180, 90d supply, fill #1

## 2021-08-21 NOTE — Telephone Encounter (Signed)
Eliquis '5mg'$  refill request received. Patient is 56 years old, weight-107.8kg, Crea-1.25 on 08/15/2021, Diagnosis-Afib, and last seen by Dr. Rockey Situ on 06/08/2021. Dose is appropriate based on dosing criteria. Will send in refill to requested pharmacy.

## 2021-08-21 NOTE — Telephone Encounter (Signed)
Refill request

## 2021-08-23 DIAGNOSIS — C189 Malignant neoplasm of colon, unspecified: Secondary | ICD-10-CM | POA: Diagnosis not present

## 2021-08-28 MED FILL — Dexamethasone Sodium Phosphate Inj 100 MG/10ML: INTRAMUSCULAR | Qty: 1 | Status: AC

## 2021-08-29 ENCOUNTER — Inpatient Hospital Stay: Payer: 59

## 2021-08-29 ENCOUNTER — Encounter: Payer: Self-pay | Admitting: Internal Medicine

## 2021-08-29 ENCOUNTER — Inpatient Hospital Stay (HOSPITAL_BASED_OUTPATIENT_CLINIC_OR_DEPARTMENT_OTHER): Payer: 59 | Admitting: Internal Medicine

## 2021-08-29 DIAGNOSIS — C189 Malignant neoplasm of colon, unspecified: Secondary | ICD-10-CM | POA: Diagnosis not present

## 2021-08-29 DIAGNOSIS — D6959 Other secondary thrombocytopenia: Secondary | ICD-10-CM | POA: Diagnosis not present

## 2021-08-29 DIAGNOSIS — Z5111 Encounter for antineoplastic chemotherapy: Secondary | ICD-10-CM | POA: Diagnosis not present

## 2021-08-29 DIAGNOSIS — D751 Secondary polycythemia: Secondary | ICD-10-CM | POA: Diagnosis not present

## 2021-08-29 DIAGNOSIS — R200 Anesthesia of skin: Secondary | ICD-10-CM | POA: Diagnosis not present

## 2021-08-29 DIAGNOSIS — C779 Secondary and unspecified malignant neoplasm of lymph node, unspecified: Secondary | ICD-10-CM | POA: Diagnosis not present

## 2021-08-29 DIAGNOSIS — C187 Malignant neoplasm of sigmoid colon: Secondary | ICD-10-CM | POA: Diagnosis not present

## 2021-08-29 DIAGNOSIS — R5383 Other fatigue: Secondary | ICD-10-CM | POA: Diagnosis not present

## 2021-08-29 DIAGNOSIS — R202 Paresthesia of skin: Secondary | ICD-10-CM | POA: Diagnosis not present

## 2021-08-29 DIAGNOSIS — Z5189 Encounter for other specified aftercare: Secondary | ICD-10-CM | POA: Diagnosis not present

## 2021-08-29 LAB — COMPREHENSIVE METABOLIC PANEL
ALT: 29 U/L (ref 0–44)
AST: 31 U/L (ref 15–41)
Albumin: 3.7 g/dL (ref 3.5–5.0)
Alkaline Phosphatase: 124 U/L (ref 38–126)
Anion gap: 8 (ref 5–15)
BUN: 17 mg/dL (ref 6–20)
CO2: 24 mmol/L (ref 22–32)
Calcium: 8.5 mg/dL — ABNORMAL LOW (ref 8.9–10.3)
Chloride: 103 mmol/L (ref 98–111)
Creatinine, Ser: 1.24 mg/dL (ref 0.61–1.24)
GFR, Estimated: >60 mL/min (ref >60)
Glucose, Bld: 191 mg/dL — ABNORMAL HIGH (ref 70–99)
Potassium: 3.8 mmol/L (ref 3.5–5.1)
Sodium: 135 mmol/L (ref 135–145)
Total Bilirubin: 0.6 mg/dL (ref 0.3–1.2)
Total Protein: 7.4 g/dL (ref 6.5–8.1)

## 2021-08-29 LAB — CBC WITH DIFFERENTIAL/PLATELET
Abs Immature Granulocytes: 0.55 10*3/uL — ABNORMAL HIGH (ref 0.00–0.07)
Basophils Absolute: 0.1 10*3/uL (ref 0.0–0.1)
Basophils Relative: 1 %
Eosinophils Absolute: 0.1 10*3/uL (ref 0.0–0.5)
Eosinophils Relative: 1 %
HCT: 44 % (ref 39.0–52.0)
Hemoglobin: 14.6 g/dL (ref 13.0–17.0)
Immature Granulocytes: 6 %
Lymphocytes Relative: 11 %
Lymphs Abs: 1 10*3/uL (ref 0.7–4.0)
MCH: 30.6 pg (ref 26.0–34.0)
MCHC: 33.2 g/dL (ref 30.0–36.0)
MCV: 92.2 fL (ref 80.0–100.0)
Monocytes Absolute: 0.6 10*3/uL (ref 0.1–1.0)
Monocytes Relative: 6 %
Neutro Abs: 6.6 10*3/uL (ref 1.7–7.7)
Neutrophils Relative %: 75 %
Platelets: 100 10*3/uL — ABNORMAL LOW (ref 150–400)
RBC: 4.77 MIL/uL (ref 4.22–5.81)
RDW: 16.8 % — ABNORMAL HIGH (ref 11.5–15.5)
Smear Review: NORMAL
WBC: 9 10*3/uL (ref 4.0–10.5)
nRBC: 0 % (ref 0.0–0.2)

## 2021-08-29 MED ORDER — SODIUM CHLORIDE 0.9 % IV SOLN
10.0000 mg | Freq: Once | INTRAVENOUS | Status: AC
  Administered 2021-08-29: 10 mg via INTRAVENOUS
  Filled 2021-08-29: qty 10

## 2021-08-29 MED ORDER — DEXTROSE 5 % IV SOLN
Freq: Once | INTRAVENOUS | Status: AC
  Filled 2021-08-29: qty 250

## 2021-08-29 MED ORDER — SODIUM CHLORIDE 0.9 % IV SOLN
2400.0000 mg/m2 | INTRAVENOUS | Status: DC
  Administered 2021-08-29: 5600 mg via INTRAVENOUS
  Filled 2021-08-29: qty 112

## 2021-08-29 MED ORDER — PALONOSETRON HCL INJECTION 0.25 MG/5ML
0.2500 mg | Freq: Once | INTRAVENOUS | Status: AC
  Administered 2021-08-29: 0.25 mg via INTRAVENOUS
  Filled 2021-08-29: qty 5

## 2021-08-29 MED ORDER — OXALIPLATIN CHEMO INJECTION 100 MG/20ML
60.0000 mg/m2 | Freq: Once | INTRAVENOUS | Status: AC
  Administered 2021-08-29: 140 mg via INTRAVENOUS
  Filled 2021-08-29: qty 10

## 2021-08-29 MED ORDER — LEUCOVORIN CALCIUM INJECTION 350 MG
408.0000 mg/m2 | Freq: Once | INTRAVENOUS | Status: AC
  Administered 2021-08-29: 950 mg via INTRAVENOUS
  Filled 2021-08-29 (×2): qty 47.5

## 2021-08-29 NOTE — Patient Instructions (Signed)
[facility] CANCER CTR AT [place]  Discharge Instructions: Thank you for choosing [facility] to provide your oncology and hematology care.  If you have a lab appointment with the [facility], please go directly to the [facility] and check in at the registration area.  Wear comfortable clothing and clothing appropriate for easy access to any Portacath or PICC line.   We strive to give you quality time with your provider. You may need to reschedule your appointment if you arrive late (15 or more minutes).  Arriving late affects you and other patients whose appointments are after yours.  Also, if you miss three or more appointments without notifying the office, you may be dismissed from the clinic at the provider's discretion.      For prescription refill requests, have your pharmacy contact our office and allow 72 hours for refills to be completed.    Today you received the following chemotherapy and/or immunotherapy agents Oxaliplatin, Adrucil, & Leucovorin      To help prevent nausea and vomiting after your treatment, we encourage you to take your nausea medication as directed.  BELOW ARE SYMPTOMS THAT SHOULD BE REPORTED IMMEDIATELY: *FEVER GREATER THAN 100.4 F (38 C) OR HIGHER *CHILLS OR SWEATING *NAUSEA AND VOMITING THAT IS NOT CONTROLLED WITH YOUR NAUSEA MEDICATION *UNUSUAL SHORTNESS OF BREATH *UNUSUAL BRUISING OR BLEEDING *URINARY PROBLEMS (pain or burning when urinating, or frequent urination) *BOWEL PROBLEMS (unusual diarrhea, constipation, pain near the anus) TENDERNESS IN MOUTH AND THROAT WITH OR WITHOUT PRESENCE OF ULCERS (sore throat, sores in mouth, or a toothache) UNUSUAL RASH, SWELLING OR PAIN  UNUSUAL VAGINAL DISCHARGE OR ITCHING   Items with * indicate a potential emergency and should be followed up as soon as possible or go to the Emergency Department if any problems should occur.  Please show the CHEMOTHERAPY ALERT CARD or IMMUNOTHERAPY  ALERT CARD at check-in to the Emergency Department and triage nurse.  Should you have questions after your visit or need to cancel or reschedule your appointment, please contact [facility] CANCER [place] AT [place]  [phone] and follow the prompts.  Office hours are 8:00 a.m. to 4:30 p.m. Monday - Friday. Please note that voicemails left after 4:00 p.m. may not be returned until the following business day.  We are closed weekends and major holidays. You have access to a nurse at all times for urgent questions. Please call the main number to the clinic [phone] and follow the prompts.  For any non-urgent questions, you may also contact your provider using MyChart. We now offer e-Visits for anyone 52 and older to request care online for non-urgent symptoms. For details visit [email or web address].   Also download the MyChart app! Go to the app store, search "MyChart", open the app, select [facility], and log in with your MyChart username and password.  Masks are optional in the cancer centers. If you would like for your care team to wear a mask while they are taking care of you, please let them know. For doctor visits, patients may have with them one support person who is at least 56 years old. At this time, visitors are not allowed in the infusion area.
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
## 2021-08-29 NOTE — Progress Notes (Signed)
Per Dr. Rogue Bussing, ok to proceed without the differential part of CBC, including [place]

## 2021-08-29 NOTE — Assessment & Plan Note (Addendum)
#   Stage III colon cancer T3N2-rectosigmoid; distal 5 mm margin; MSS. currently on adjuvant chemo FOLFOX q 2w x12.   # Proceed with FOLFOX # 8/12 today. Labs today reviewed;  acceptable for treatment today.D-3 ziextenzo.     #Thrombocytopenia-secondary to oxaliplatin; overall stable today platelets 100.  # Solitary kidney- GFR- creat- 1.26 [GFR- 60]; recent acute renal failure [JAN 2023-creatinine-5.3]; monitor closely. STABLE.    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   # Hx of Afib-eliquis/amiodarone-monitor closely for bleeding tendencies on chemotherapy/htrombocytopenia- STABLE  #  IV access:port- Functional.   # DISPOSITION:  # chemo today; pump off D-3 # follow up in 2 weeks/Wed- MD; labs-cbc/cmp; FOLFOX; D-3 pump off; ziextenzo-;Dr.B

## 2021-08-30 ENCOUNTER — Other Ambulatory Visit: Payer: Self-pay | Admitting: *Deleted

## 2021-08-30 NOTE — Patient Outreach (Signed)
[facility]) Care Management  08/30/2021  Tracy Gardner 14-Jan-1966 [id number]  Telephone outreach to advise [facility] participant that the [facility] participants are being closed if their goals have been completed and are on quarterly calls.  [name] voiced understanding. We had discussed that on our next call scheduled for 11/09/21 would be our last call. He expresses his appreciation for the services. Advised that he would be receiving a closure letter and a Certificate of Completion.  Eulah Pont. Myrtie Neither, MSN, [facility] Gerontological Nurse Practitioner [facility] Care Management [phone]

## 2021-08-31 ENCOUNTER — Inpatient Hospital Stay: Payer: 59

## 2021-08-31 VITALS — BP 128/77 | HR 71 | Temp 97.1°F

## 2021-08-31 DIAGNOSIS — R5383 Other fatigue: Secondary | ICD-10-CM | POA: Diagnosis not present

## 2021-08-31 DIAGNOSIS — C779 Secondary and unspecified malignant neoplasm of lymph node, unspecified: Secondary | ICD-10-CM | POA: Diagnosis not present

## 2021-08-31 DIAGNOSIS — Z5189 Encounter for other specified aftercare: Secondary | ICD-10-CM | POA: Diagnosis not present

## 2021-08-31 DIAGNOSIS — R200 Anesthesia of skin: Secondary | ICD-10-CM | POA: Diagnosis not present

## 2021-08-31 DIAGNOSIS — Z5111 Encounter for antineoplastic chemotherapy: Secondary | ICD-10-CM | POA: Diagnosis not present

## 2021-08-31 DIAGNOSIS — D751 Secondary polycythemia: Secondary | ICD-10-CM | POA: Diagnosis not present

## 2021-08-31 DIAGNOSIS — D6959 Other secondary thrombocytopenia: Secondary | ICD-10-CM | POA: Diagnosis not present

## 2021-08-31 DIAGNOSIS — R202 Paresthesia of skin: Secondary | ICD-10-CM | POA: Diagnosis not present

## 2021-08-31 DIAGNOSIS — C187 Malignant neoplasm of sigmoid colon: Secondary | ICD-10-CM | POA: Diagnosis not present

## 2021-08-31 MED ORDER — PEGFILGRASTIM-BMEZ 6 MG/0.6ML ~~LOC~~ SOSY
6.0000 mg | PREFILLED_SYRINGE | Freq: Once | SUBCUTANEOUS | Status: AC
  Administered 2021-08-31: 6 mg via SUBCUTANEOUS
  Filled 2021-08-31: qty 0.6

## 2021-08-31 MED ORDER — SODIUM CHLORIDE 0.9% FLUSH
10.0000 mL | INTRAVENOUS | Status: DC | PRN
  Administered 2021-08-31: 10 mL
  Filled 2021-08-31: qty 10

## 2021-08-31 MED ORDER — HEPARIN SOD (PORK) LOCK FLUSH 100 UNIT/ML IV SOLN
500.0000 [IU] | Freq: Once | INTRAVENOUS | Status: AC | PRN
  Administered 2021-08-31: 500 [IU]
  Filled 2021-08-31: qty 5

## 2021-09-01 ENCOUNTER — Other Ambulatory Visit: Payer: Self-pay

## 2021-09-01 MED ORDER — JARDIANCE 10 MG PO TABS
10.0000 mg | ORAL_TABLET | Freq: Every day | ORAL | 11 refills | Status: DC
Start: 2021-09-01 — End: 2022-09-10
  Filled 2021-09-01: qty 30, 30d supply, fill #0
  Filled 2021-09-28: qty 30, 30d supply, fill #1
  Filled 2021-10-24: qty 30, 30d supply, fill #2
  Filled 2021-11-01 – 2021-11-22 (×2): qty 30, 30d supply, fill #3
  Filled 2022-01-26: qty 30, 30d supply, fill #4
  Filled 2022-02-21: qty 30, 30d supply, fill #5
  Filled 2022-05-10: qty 30, 30d supply, fill #6
  Filled 2022-06-19 (×2): qty 30, 30d supply, fill #7
  Filled 2022-08-06: qty 30, 30d supply, fill #8

## 2021-09-13 ENCOUNTER — Other Ambulatory Visit: Payer: Self-pay

## 2021-09-13 ENCOUNTER — Inpatient Hospital Stay: Payer: 59

## 2021-09-13 ENCOUNTER — Inpatient Hospital Stay: Payer: 59 | Attending: Internal Medicine | Admitting: Internal Medicine

## 2021-09-13 ENCOUNTER — Encounter: Payer: Self-pay | Admitting: Internal Medicine

## 2021-09-13 DIAGNOSIS — R809 Proteinuria, unspecified: Secondary | ICD-10-CM | POA: Diagnosis not present

## 2021-09-13 DIAGNOSIS — I129 Hypertensive chronic kidney disease with stage 1 through stage 4 chronic kidney disease, or unspecified chronic kidney disease: Secondary | ICD-10-CM | POA: Insufficient documentation

## 2021-09-13 DIAGNOSIS — C187 Malignant neoplasm of sigmoid colon: Secondary | ICD-10-CM

## 2021-09-13 DIAGNOSIS — K56609 Unspecified intestinal obstruction, unspecified as to partial versus complete obstruction: Secondary | ICD-10-CM | POA: Diagnosis not present

## 2021-09-13 DIAGNOSIS — Z87891 Personal history of nicotine dependence: Secondary | ICD-10-CM | POA: Diagnosis not present

## 2021-09-13 DIAGNOSIS — D751 Secondary polycythemia: Secondary | ICD-10-CM | POA: Diagnosis present

## 2021-09-13 DIAGNOSIS — N183 Chronic kidney disease, stage 3 unspecified: Secondary | ICD-10-CM | POA: Diagnosis not present

## 2021-09-13 DIAGNOSIS — Z8249 Family history of ischemic heart disease and other diseases of the circulatory system: Secondary | ICD-10-CM | POA: Insufficient documentation

## 2021-09-13 DIAGNOSIS — Z5111 Encounter for antineoplastic chemotherapy: Secondary | ICD-10-CM | POA: Insufficient documentation

## 2021-09-13 DIAGNOSIS — R5383 Other fatigue: Secondary | ICD-10-CM | POA: Insufficient documentation

## 2021-09-13 DIAGNOSIS — Z905 Acquired absence of kidney: Secondary | ICD-10-CM | POA: Diagnosis not present

## 2021-09-13 DIAGNOSIS — D696 Thrombocytopenia, unspecified: Secondary | ICD-10-CM | POA: Diagnosis not present

## 2021-09-13 DIAGNOSIS — F17290 Nicotine dependence, other tobacco product, uncomplicated: Secondary | ICD-10-CM | POA: Diagnosis not present

## 2021-09-13 DIAGNOSIS — I4819 Other persistent atrial fibrillation: Secondary | ICD-10-CM | POA: Insufficient documentation

## 2021-09-13 DIAGNOSIS — C19 Malignant neoplasm of rectosigmoid junction: Secondary | ICD-10-CM | POA: Insufficient documentation

## 2021-09-13 DIAGNOSIS — Z7901 Long term (current) use of anticoagulants: Secondary | ICD-10-CM | POA: Diagnosis not present

## 2021-09-13 DIAGNOSIS — I429 Cardiomyopathy, unspecified: Secondary | ICD-10-CM | POA: Diagnosis not present

## 2021-09-13 DIAGNOSIS — Z833 Family history of diabetes mellitus: Secondary | ICD-10-CM | POA: Diagnosis not present

## 2021-09-13 DIAGNOSIS — R202 Paresthesia of skin: Secondary | ICD-10-CM | POA: Insufficient documentation

## 2021-09-13 DIAGNOSIS — N179 Acute kidney failure, unspecified: Secondary | ICD-10-CM | POA: Insufficient documentation

## 2021-09-13 DIAGNOSIS — R200 Anesthesia of skin: Secondary | ICD-10-CM | POA: Diagnosis not present

## 2021-09-13 DIAGNOSIS — Z79899 Other long term (current) drug therapy: Secondary | ICD-10-CM | POA: Insufficient documentation

## 2021-09-13 DIAGNOSIS — Z5189 Encounter for other specified aftercare: Secondary | ICD-10-CM | POA: Diagnosis not present

## 2021-09-13 DIAGNOSIS — C189 Malignant neoplasm of colon, unspecified: Secondary | ICD-10-CM | POA: Diagnosis not present

## 2021-09-13 DIAGNOSIS — Z87442 Personal history of urinary calculi: Secondary | ICD-10-CM | POA: Insufficient documentation

## 2021-09-13 LAB — CBC WITH DIFFERENTIAL/PLATELET
Abs Immature Granulocytes: 0.12 10*3/uL — ABNORMAL HIGH (ref 0.00–0.07)
Basophils Absolute: 0.1 10*3/uL (ref 0.0–0.1)
Basophils Relative: 1 %
Eosinophils Absolute: 0.1 10*3/uL (ref 0.0–0.5)
Eosinophils Relative: 2 %
HCT: 44.7 % (ref 39.0–52.0)
Hemoglobin: 14.8 g/dL (ref 13.0–17.0)
Immature Granulocytes: 2 %
Lymphocytes Relative: 13 %
Lymphs Abs: 0.8 10*3/uL (ref 0.7–4.0)
MCH: 30.6 pg (ref 26.0–34.0)
MCHC: 33.1 g/dL (ref 30.0–36.0)
MCV: 92.5 fL (ref 80.0–100.0)
Monocytes Absolute: 0.6 10*3/uL (ref 0.1–1.0)
Monocytes Relative: 9 %
Neutro Abs: 4.6 10*3/uL (ref 1.7–7.7)
Neutrophils Relative %: 73 %
Platelets: 112 10*3/uL — ABNORMAL LOW (ref 150–400)
RBC: 4.83 MIL/uL (ref 4.22–5.81)
RDW: 16.8 % — ABNORMAL HIGH (ref 11.5–15.5)
WBC: 6.3 10*3/uL (ref 4.0–10.5)
nRBC: 0 % (ref 0.0–0.2)

## 2021-09-13 LAB — COMPREHENSIVE METABOLIC PANEL
ALT: 28 U/L (ref 0–44)
AST: 27 U/L (ref 15–41)
Albumin: 3.8 g/dL (ref 3.5–5.0)
Alkaline Phosphatase: 100 U/L (ref 38–126)
Anion gap: 8 (ref 5–15)
BUN: 12 mg/dL (ref 6–20)
CO2: 23 mmol/L (ref 22–32)
Calcium: 8.5 mg/dL — ABNORMAL LOW (ref 8.9–10.3)
Chloride: 105 mmol/L (ref 98–111)
Creatinine, Ser: 1.08 mg/dL (ref 0.61–1.24)
GFR, Estimated: >60 mL/min (ref >60)
Glucose, Bld: 138 mg/dL — ABNORMAL HIGH (ref 70–99)
Potassium: 4 mmol/L (ref 3.5–5.1)
Sodium: 136 mmol/L (ref 135–145)
Total Bilirubin: 0.6 mg/dL (ref 0.3–1.2)
Total Protein: 7.3 g/dL (ref 6.5–8.1)

## 2021-09-13 MED ORDER — SODIUM CHLORIDE 0.9 % IV SOLN
10.0000 mg | Freq: Once | INTRAVENOUS | Status: AC
  Administered 2021-09-13: 10 mg via INTRAVENOUS
  Filled 2021-09-13: qty 1

## 2021-09-13 MED ORDER — PALONOSETRON HCL INJECTION 0.25 MG/5ML
0.2500 mg | Freq: Once | INTRAVENOUS | Status: AC
  Administered 2021-09-13: 0.25 mg via INTRAVENOUS
  Filled 2021-09-13: qty 5

## 2021-09-13 MED ORDER — SODIUM CHLORIDE 0.9 % IV SOLN
2400.0000 mg/m2 | INTRAVENOUS | Status: DC
  Administered 2021-09-13: 5600 mg via INTRAVENOUS
  Filled 2021-09-13: qty 112

## 2021-09-13 MED ORDER — OXALIPLATIN CHEMO INJECTION 100 MG/20ML
60.0000 mg/m2 | Freq: Once | INTRAVENOUS | Status: AC
  Administered 2021-09-13: 140 mg via INTRAVENOUS
  Filled 2021-09-13: qty 10

## 2021-09-13 MED ORDER — DEXTROSE 5 % IV SOLN
Freq: Once | INTRAVENOUS | Status: AC
  Filled 2021-09-13: qty 250

## 2021-09-13 MED ORDER — LEUCOVORIN CALCIUM INJECTION 350 MG
950.0000 mg | Freq: Once | INTRAVENOUS | Status: AC
  Administered 2021-09-13: 950 mg via INTRAVENOUS
  Filled 2021-09-13: qty 35
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
## 2021-09-13 NOTE — Patient Instructions (Signed)
[facility]  Discharge Instructions: Thank you for choosing [facility] to provide your oncology and hematology care.  If you have a lab appointment with the Cancer Center, please go directly to the Cancer Center and check in at the registration area.  Wear comfortable clothing and clothing appropriate for easy access to any Portacath or PICC line.   We strive to give you quality time with your provider. You may need to reschedule your appointment if you arrive late (15 or more minutes).  Arriving late affects you and other patients whose appointments are after yours.  Also, if you miss three or more appointments without notifying the office, you may be dismissed from the clinic at the provider's discretion.      For prescription refill requests, have your pharmacy contact our office and allow 72 hours for refills to be completed.    Today you received the following chemotherapy and/or immunotherapy agents FOLFOX      To help prevent nausea and vomiting after your treatment, we encourage you to take your nausea medication as directed.  BELOW ARE SYMPTOMS THAT SHOULD BE REPORTED IMMEDIATELY: *FEVER GREATER THAN 100.4 F (38 C) OR HIGHER *CHILLS OR SWEATING *NAUSEA AND VOMITING THAT IS NOT CONTROLLED WITH YOUR NAUSEA MEDICATION *UNUSUAL SHORTNESS OF BREATH *UNUSUAL BRUISING OR BLEEDING *URINARY PROBLEMS (pain or burning when urinating, or frequent urination) *BOWEL PROBLEMS (unusual diarrhea, constipation, pain near the anus) TENDERNESS IN MOUTH AND THROAT WITH OR WITHOUT PRESENCE OF ULCERS (sore throat, sores in mouth, or a toothache) UNUSUAL RASH, SWELLING OR PAIN  UNUSUAL VAGINAL DISCHARGE OR ITCHING   Items with * indicate a potential emergency and should be followed up as soon as possible or go to the Emergency Department if any problems should occur.  Please show the CHEMOTHERAPY ALERT CARD or IMMUNOTHERAPY ALERT CARD at check-in to the  Emergency Department and triage nurse.  Should you have questions after your visit or need to cancel or reschedule your appointment, please contact [facility]  [phone] and follow the prompts.  Office hours are 8:00 a.m. to 4:30 p.m. Monday - Friday. Please note that voicemails left after 4:00 p.m. may not be returned until the following business day.  We are closed weekends and major holidays. You have access to a nurse at all times for urgent questions. Please call the main number to the clinic [phone] and follow the prompts.  For any non-urgent questions, you may also contact your provider using MyChart. We now offer e-Visits for anyone 18 and older to request care online for non-urgent symptoms. For details visit [email or web address].   Also download the MyChart app! Go to the app store, search "MyChart", open the app, select [place], and log in with your MyChart username and password.  Masks are optional in the cancer centers. If you would like for your care team to wear a mask while they are taking care of you, please let them know. For doctor visits, patients may have with them one support person who is at least 56 years old. At this time, visitors are not allowed in the infusion area.   

## 2021-09-13 NOTE — Assessment & Plan Note (Addendum)
#   Stage III colon cancer T3N2-rectosigmoid; distal 5 mm margin; MSS. currently on adjuvant chemo FOLFOX q 2w x12.   # Proceed with FOLFOX # 9/12 today. Labs today reviewed;  acceptable for treatment today.D-3 ziextenzo.  No further neutropenia noted on the booster injection.     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  # Solitary kidney- GFR- creat- 1.26 [GFR- 60]; hx of acute renal failure [JAN 2023-creatinine-5.3]; monitor closely. STABLE.    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   # Hx of Afib-eliquis/amiodarone-monitor closely for bleeding tendencies on chemotherapy/htrombocytopenia- STABLE  #  IV access:port- Functional.   # DISPOSITION:   # chemo today; pump off D-3  # follow up in 2 weeks/Wed- MD; labs-cbc/cmp; FOLFOX; D-3 pump off; ziextenzo  # follow up in 4 weeks/Wed- MD; labs-cbc/cmp; FOLFOX; D-3 pump off; ziextenzo-;Dr.B

## 2021-09-13 NOTE — Progress Notes (Signed)
Patient tolerated Folfox infusion well, home with pump. No questions/concerns voiced. Patient stable at discharge. AVS given.

## 2021-09-14 ENCOUNTER — Other Ambulatory Visit: Payer: Self-pay

## 2021-09-14 MED ORDER — FLUTICASONE PROPIONATE HFA 110 MCG/ACT IN AERO
INHALATION_SPRAY | RESPIRATORY_TRACT | 12 refills | Status: DC
  Filled 2021-09-14: qty 12, 30d supply, fill #0
  Filled 2021-11-01: qty 12, 30d supply, fill #1

## 2021-09-15 ENCOUNTER — Inpatient Hospital Stay: Payer: 59

## 2021-09-15 VITALS — BP 125/71 | HR 62 | Temp 97.1°F | Resp 16

## 2021-09-15 DIAGNOSIS — R809 Proteinuria, unspecified: Secondary | ICD-10-CM | POA: Diagnosis not present

## 2021-09-15 DIAGNOSIS — R202 Paresthesia of skin: Secondary | ICD-10-CM | POA: Diagnosis not present

## 2021-09-15 DIAGNOSIS — C187 Malignant neoplasm of sigmoid colon: Secondary | ICD-10-CM

## 2021-09-15 DIAGNOSIS — I4819 Other persistent atrial fibrillation: Secondary | ICD-10-CM | POA: Diagnosis not present

## 2021-09-15 DIAGNOSIS — C19 Malignant neoplasm of rectosigmoid junction: Secondary | ICD-10-CM | POA: Diagnosis not present

## 2021-09-15 DIAGNOSIS — Z5189 Encounter for other specified aftercare: Secondary | ICD-10-CM | POA: Diagnosis not present

## 2021-09-15 DIAGNOSIS — I129 Hypertensive chronic kidney disease with stage 1 through stage 4 chronic kidney disease, or unspecified chronic kidney disease: Secondary | ICD-10-CM | POA: Diagnosis not present

## 2021-09-15 DIAGNOSIS — R200 Anesthesia of skin: Secondary | ICD-10-CM | POA: Diagnosis not present

## 2021-09-15 DIAGNOSIS — R5383 Other fatigue: Secondary | ICD-10-CM | POA: Diagnosis not present

## 2021-09-15 DIAGNOSIS — Z5111 Encounter for antineoplastic chemotherapy: Secondary | ICD-10-CM | POA: Diagnosis not present

## 2021-09-15 MED ORDER — SODIUM CHLORIDE 0.9% FLUSH
10.0000 mL | INTRAVENOUS | Status: DC | PRN
  Administered 2021-09-15: 10 mL
  Filled 2021-09-15: qty 10

## 2021-09-15 MED ORDER — PEGFILGRASTIM INJECTION 6 MG/0.6ML ~~LOC~~
6.0000 mg | PREFILLED_SYRINGE | Freq: Once | SUBCUTANEOUS | Status: AC
  Administered 2021-09-15: 6 mg via SUBCUTANEOUS

## 2021-09-15 MED ORDER — HEPARIN SOD (PORK) LOCK FLUSH 100 UNIT/ML IV SOLN
500.0000 [IU] | Freq: Once | INTRAVENOUS | Status: AC | PRN
  Administered 2021-09-15: 500 [IU]
  Filled 2021-09-15: qty 5

## 2021-09-15 NOTE — Patient Instructions (Signed)
[facility] AT [place]  Discharge Instructions: Thank you for choosing [facility] to provide your oncology and hematology care.  If you have a lab appointment with the [facility], please go directly to the [facility] and check in at the registration area.  Wear comfortable clothing and clothing appropriate for easy access to any Portacath or PICC line.   We strive to give you quality time with your provider. You may need to reschedule your appointment if you arrive late (15 or more minutes).  Arriving late affects you and other patients whose appointments are after yours.  Also, if you miss three or more appointments without notifying the office, you may be dismissed from the clinic at the provider's discretion.      For prescription refill requests, have your pharmacy contact our office and allow 72 hours for refills to be completed.    Today you received the following chemotherapy and/or immunotherapy agents PUMP DC and NEUALSTA      To help prevent nausea and vomiting after your treatment, we encourage you to take your nausea medication as directed.  BELOW ARE SYMPTOMS THAT SHOULD BE REPORTED IMMEDIATELY: *FEVER GREATER THAN 100.4 F (38 C) OR HIGHER *CHILLS OR SWEATING *NAUSEA AND VOMITING THAT IS NOT CONTROLLED WITH YOUR NAUSEA MEDICATION *UNUSUAL SHORTNESS OF BREATH *UNUSUAL BRUISING OR BLEEDING *URINARY PROBLEMS (pain or burning when urinating, or frequent urination) *BOWEL PROBLEMS (unusual diarrhea, constipation, pain near the anus) TENDERNESS IN MOUTH AND THROAT WITH OR WITHOUT PRESENCE OF ULCERS (sore throat, sores in mouth, or a toothache) UNUSUAL RASH, SWELLING OR PAIN  UNUSUAL VAGINAL DISCHARGE OR ITCHING   Items with * indicate a potential emergency and should be followed up as soon as possible or go to the Emergency Department if any problems should occur.  Please show the CHEMOTHERAPY ALERT CARD or IMMUNOTHERAPY ALERT CARD at  check-in to the Emergency Department and triage nurse.  Should you have questions after your visit or need to cancel or reschedule your appointment, please contact [facility] [place] [facility] [place]  [phone] and follow the prompts.  Office hours are 8:00 a.m. to 4:30 p.m. Monday - Friday. Please note that voicemails left after 4:00 p.m. may not be returned until the following business day.  We are closed weekends and major holidays. You have access to a nurse at all times for urgent questions. Please call the main number to the clinic [phone] and follow the prompts.  For any non-urgent questions, you may also contact your provider using MyChart. We now offer e-Visits for anyone 24 and older to request care online for non-urgent symptoms. For details visit [email or web address].   Also download the MyChart app! Go to the app store, search "MyChart", open the app, select [facility], and log in with your MyChart username and password.  Masks are optional in the cancer centers. If you would like for your care team to wear a mask while they are taking care of you, please let them know. For doctor visits, patients may have with them one support person who is at least 56 years old. At this time, visitors are not allowed in the infusion area.  Pegfilgrastim Injection What is this medication? PEGFILGRASTIM (PEG fil gra stim) lowers the risk of infection in people who are receiving chemotherapy. It works by Building control surveyor make more white blood cells, which protects your body from infection. It may also be used to help people who have been exposed to high  doses of radiation. This medicine may be used for other purposes; ask your health care provider or pharmacist if you have questions. COMMON BRAND NAME(S): [place] [employer], Neulasta, Nyvepria, Stimufend, UDENYCA, Ziextenzo What should I tell my care team before I take this medication? They need to know if you have any of these  conditions: Kidney disease Latex allergy Ongoing radiation therapy Sickle cell disease Skin reactions to acrylic adhesives (On-Body Injector only) An unusual or allergic reaction to pegfilgrastim, filgrastim, other medications, foods, dyes, or preservatives Pregnant or trying to get pregnant Breast-feeding How should I use this medication? This medication is for injection under the skin. If you get this medication at home, you will be taught how to prepare and give the pre-filled syringe or how to use the On-body Injector. Refer to the patient Instructions for Use for detailed instructions. Use exactly as directed. Tell your care team immediately if you suspect that the On-body Injector may not have performed as intended or if you suspect the use of the On-body Injector resulted in a missed or partial dose. It is important that you put your used needles and syringes in a special sharps container. Do not put them in a trash can. If you do not have a sharps container, call your pharmacist or care team to get one. Talk to your care team about the use of this medication in children. While this medication may be prescribed for selected conditions, precautions do apply. Overdosage: If you think you have taken too much of this medicine contact a poison control center or emergency room at once. NOTE: This medicine is only for you. Do not share this medicine with others. What if I miss a dose? It is important not to miss your dose. Call your care team if you miss your dose. If you miss a dose due to an On-body Injector failure or leakage, a new dose should be administered as soon as possible using a single prefilled syringe for manual use. What may interact with this medication? Interactions have not been studied. This list may not describe all possible interactions. Give your health care provider a list of all the medicines, herbs, non-prescription drugs, or dietary supplements you use. Also tell them if  you smoke, drink alcohol, or use illegal drugs. Some items may interact with your medicine. What should I watch for while using this medication? Your condition will be monitored carefully while you are receiving this medication. You may need blood work done while you are taking this medication. Talk to your care team about your risk of cancer. You may be more at risk for certain types of cancer if you take this medication. If you are going to need a MRI, CT scan, or other procedure, tell your care team that you are using this medication (On-Body Injector only). What side effects may I notice from receiving this medication? Side effects that you should report to your care team as soon as possible: Allergic reactions--skin rash, itching, hives, swelling of the face, lips, tongue, or throat Capillary leak syndrome--stomach or muscle pain, unusual weakness or fatigue, feeling faint or lightheaded, decrease in the amount of urine, swelling of the ankles, hands, or feet, trouble breathing High white blood cell level--fever, fatigue, trouble breathing, night sweats, change in vision, weight loss Inflammation of the aorta--fever, fatigue, back, chest, or stomach pain, severe headache Kidney injury (glomerulonephritis)--decrease in the amount of urine, red or dark brown urine, foamy or bubbly urine, swelling of the ankles, hands, or feet  Shortness of breath or trouble breathing Spleen injury--pain in upper left stomach or shoulder Unusual bruising or bleeding Side effects that usually do not require medical attention (report to your care team if they continue or are bothersome): Bone pain Pain in the hands or feet This list may not describe all possible side effects. Call your doctor for medical advice about side effects. You may report side effects to FDA at 1-800-FDA-1088. Where should I keep my medication? Keep out of the reach of children. If you are using this medication at home, you will be  instructed on how to store it. Throw away any unused medication after the expiration date on the label. NOTE: This sheet is a summary. It may not cover all possible information. If you have questions about this medicine, talk to your doctor, pharmacist, or health care provider.  2023 Elsevier/Gold Standard (2021-01-27 00:00:00)

## 2021-09-22 DIAGNOSIS — C189 Malignant neoplasm of colon, unspecified: Secondary | ICD-10-CM | POA: Diagnosis not present

## 2021-09-26 ENCOUNTER — Inpatient Hospital Stay: Payer: 59

## 2021-09-26 ENCOUNTER — Inpatient Hospital Stay (HOSPITAL_BASED_OUTPATIENT_CLINIC_OR_DEPARTMENT_OTHER): Payer: 59 | Admitting: Internal Medicine

## 2021-09-26 ENCOUNTER — Encounter: Payer: Self-pay | Admitting: Internal Medicine

## 2021-09-26 VITALS — BP 131/75 | HR 64 | Temp 97.0°F | Resp 17

## 2021-09-26 DIAGNOSIS — I129 Hypertensive chronic kidney disease with stage 1 through stage 4 chronic kidney disease, or unspecified chronic kidney disease: Secondary | ICD-10-CM | POA: Diagnosis not present

## 2021-09-26 DIAGNOSIS — C187 Malignant neoplasm of sigmoid colon: Secondary | ICD-10-CM

## 2021-09-26 DIAGNOSIS — R809 Proteinuria, unspecified: Secondary | ICD-10-CM | POA: Diagnosis not present

## 2021-09-26 DIAGNOSIS — R202 Paresthesia of skin: Secondary | ICD-10-CM | POA: Diagnosis not present

## 2021-09-26 DIAGNOSIS — Z5189 Encounter for other specified aftercare: Secondary | ICD-10-CM | POA: Diagnosis not present

## 2021-09-26 DIAGNOSIS — C19 Malignant neoplasm of rectosigmoid junction: Secondary | ICD-10-CM | POA: Diagnosis not present

## 2021-09-26 DIAGNOSIS — R200 Anesthesia of skin: Secondary | ICD-10-CM | POA: Diagnosis not present

## 2021-09-26 DIAGNOSIS — C189 Malignant neoplasm of colon, unspecified: Secondary | ICD-10-CM | POA: Diagnosis not present

## 2021-09-26 DIAGNOSIS — R5383 Other fatigue: Secondary | ICD-10-CM | POA: Diagnosis not present

## 2021-09-26 DIAGNOSIS — I4819 Other persistent atrial fibrillation: Secondary | ICD-10-CM | POA: Diagnosis not present

## 2021-09-26 DIAGNOSIS — Z5111 Encounter for antineoplastic chemotherapy: Secondary | ICD-10-CM | POA: Diagnosis not present

## 2021-09-26 LAB — CBC WITH DIFFERENTIAL/PLATELET
Abs Immature Granulocytes: 0.09 10*3/uL — ABNORMAL HIGH (ref 0.00–0.07)
Basophils Absolute: 0.1 10*3/uL (ref 0.0–0.1)
Basophils Relative: 1 %
Eosinophils Absolute: 0.2 10*3/uL (ref 0.0–0.5)
Eosinophils Relative: 3 %
HCT: 42.6 % (ref 39.0–52.0)
Hemoglobin: 14.2 g/dL (ref 13.0–17.0)
Immature Granulocytes: 1 %
Lymphocytes Relative: 10 %
Lymphs Abs: 0.8 10*3/uL (ref 0.7–4.0)
MCH: 31.1 pg (ref 26.0–34.0)
MCHC: 33.3 g/dL (ref 30.0–36.0)
MCV: 93.2 fL (ref 80.0–100.0)
Monocytes Absolute: 0.6 10*3/uL (ref 0.1–1.0)
Monocytes Relative: 7 %
Neutro Abs: 6.7 10*3/uL (ref 1.7–7.7)
Neutrophils Relative %: 78 %
Platelets: 94 10*3/uL — ABNORMAL LOW (ref 150–400)
RBC: 4.57 MIL/uL (ref 4.22–5.81)
RDW: 16.6 % — ABNORMAL HIGH (ref 11.5–15.5)
WBC: 8.5 10*3/uL (ref 4.0–10.5)
nRBC: 0 % (ref 0.0–0.2)

## 2021-09-26 LAB — COMPREHENSIVE METABOLIC PANEL
ALT: 26 U/L (ref 0–44)
AST: 25 U/L (ref 15–41)
Albumin: 3.7 g/dL (ref 3.5–5.0)
Alkaline Phosphatase: 129 U/L — ABNORMAL HIGH (ref 38–126)
Anion gap: 7 (ref 5–15)
BUN: 13 mg/dL (ref 6–20)
CO2: 23 mmol/L (ref 22–32)
Calcium: 8.3 mg/dL — ABNORMAL LOW (ref 8.9–10.3)
Chloride: 108 mmol/L (ref 98–111)
Creatinine, Ser: 1.21 mg/dL (ref 0.61–1.24)
GFR, Estimated: >60 mL/min (ref >60)
Glucose, Bld: 122 mg/dL — ABNORMAL HIGH (ref 70–99)
Potassium: 4 mmol/L (ref 3.5–5.1)
Sodium: 138 mmol/L (ref 135–145)
Total Bilirubin: 0.7 mg/dL (ref 0.3–1.2)
Total Protein: 7.2 g/dL (ref 6.5–8.1)

## 2021-09-26 MED ORDER — LEUCOVORIN CALCIUM INJECTION 350 MG
950.0000 mg | Freq: Once | INTRAVENOUS | Status: AC
  Administered 2021-09-26: 950 mg via INTRAVENOUS
  Filled 2021-09-26: qty 47.5

## 2021-09-26 MED ORDER — SODIUM CHLORIDE 0.9 % IV SOLN
10.0000 mg | Freq: Once | INTRAVENOUS | Status: AC
  Administered 2021-09-26: 10 mg via INTRAVENOUS
  Filled 2021-09-26: qty 10

## 2021-09-26 MED ORDER — SODIUM CHLORIDE 0.9% FLUSH
10.0000 mL | Freq: Once | INTRAVENOUS | Status: AC
  Administered 2021-09-26: 10 mL via INTRAVENOUS
  Filled 2021-09-26: qty 10

## 2021-09-26 MED ORDER — OXALIPLATIN CHEMO INJECTION 100 MG/20ML
60.0000 mg/m2 | Freq: Once | INTRAVENOUS | Status: AC
  Administered 2021-09-26: 140 mg via INTRAVENOUS
  Filled 2021-09-26: qty 20

## 2021-09-26 MED ORDER — DEXTROSE 5 % IV SOLN
Freq: Once | INTRAVENOUS | Status: AC
  Filled 2021-09-26: qty 250

## 2021-09-26 MED ORDER — PALONOSETRON HCL INJECTION 0.25 MG/5ML
0.2500 mg | Freq: Once | INTRAVENOUS | Status: AC
  Administered 2021-09-26: 0.25 mg via INTRAVENOUS
  Filled 2021-09-26: qty 5

## 2021-09-26 MED ORDER — SODIUM CHLORIDE 0.9 % IV SOLN
2400.0000 mg/m2 | INTRAVENOUS | Status: DC
  Administered 2021-09-26: 5600 mg via INTRAVENOUS
  Filled 2021-09-26: qty 112

## 2021-09-26 NOTE — Assessment & Plan Note (Addendum)
#   Stage III colon cancer T3N2-rectosigmoid; distal 5 mm margin; MSS. currently on adjuvant chemo FOLFOX q 2w x12.   # Proceed with FOLFOX # 10/12 today. Labs today reviewed;  acceptable for treatment today.D-3 ziextenzo.  No further neutropenia noted on the booster injection.     #Thrombocytopenia-secondary to oxaliplatin; overall stable today platelets ~ 92. STABLE.   # Solitary kidney- GFR- creat- 1.26 [GFR- 60]; hx of acute renal failure [JAN 2023-creatinine-5.3]; monitor closely. STABLE.    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   # DISPOSITION:   # chemo today; pump off D-3  # AS PLANNED-  follow up in 2 weeks/Wed- MD; labs-cbc/cmp; FOLFOX; D-3 pump off; ziextenzo  # follow up in 4 weeks/Wed- MD; labs-cbc/cmp; FOLFOX; D-3 pump off; ziextenzo-;Dr.B
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
## 2021-09-26 NOTE — Patient Instructions (Signed)
[facility] CANCER CTR AT [place]  Discharge Instructions: Thank you for choosing [facility] to provide your oncology and hematology care.  If you have a lab appointment with the [facility], please go directly to the [facility] [facility] and check in at the registration area.  Wear comfortable clothing and clothing appropriate for easy access to any Portacath or PICC line.   We strive to give you quality time with your provider. You may need to reschedule your appointment if you arrive late (15 or more minutes).  Arriving late affects you and other patients whose appointments are after yours.  Also, if you miss three or more appointments without notifying the office, you may be dismissed from the clinic at the provider's discretion.      For prescription refill requests, have your pharmacy contact our office and allow 72 hours for refills to be completed.    Today you received the following chemotherapy and/or immunotherapy agents Oxaliplatin and Leucovorin       To help prevent nausea and vomiting after your treatment, we encourage you to take your nausea medication as directed.  BELOW ARE SYMPTOMS THAT SHOULD BE REPORTED IMMEDIATELY: *FEVER GREATER THAN 100.4 F (38 C) OR HIGHER *CHILLS OR SWEATING *NAUSEA AND VOMITING THAT IS NOT CONTROLLED WITH YOUR NAUSEA MEDICATION *UNUSUAL SHORTNESS OF BREATH *UNUSUAL BRUISING OR BLEEDING *URINARY PROBLEMS (pain or burning when urinating, or frequent urination) *BOWEL PROBLEMS (unusual diarrhea, constipation, pain near the anus) TENDERNESS IN MOUTH AND THROAT WITH OR WITHOUT PRESENCE OF ULCERS (sore throat, sores in mouth, or a toothache) UNUSUAL RASH, SWELLING OR PAIN  UNUSUAL VAGINAL DISCHARGE OR ITCHING   Items with * indicate a potential emergency and should be followed up as soon as possible or go to the Emergency Department if any problems should occur.  Please show the CHEMOTHERAPY ALERT CARD or IMMUNOTHERAPY ALERT  CARD at check-in to the Emergency Department and triage nurse.  Should you have questions after your visit or need to cancel or reschedule your appointment, please contact [facility] CANCER [place] AT [place]  [phone] and follow the prompts.  Office hours are 8:00 a.m. to 4:30 p.m. Monday - Friday. Please note that voicemails left after 4:00 p.m. may not be returned until the following business day.  We are closed weekends and major holidays. You have access to a nurse at all times for urgent questions. Please call the main number to the clinic [phone] and follow the prompts.  For any non-urgent questions, you may also contact your provider using MyChart. We now offer e-Visits for anyone 70 and older to request care online for non-urgent symptoms. For details visit [email or web address].   Also download the MyChart app! Go to the app store, search "MyChart", open the app, select [facility], and log in with your MyChart username and password.  Masks are optional in the cancer centers. If you would like for your care team to wear a mask while they are taking care of you, please let them know. For doctor visits, patients may have with them one support person who is at least 56 years old. At this time, visitors are not allowed in the infusion area.

## 2021-09-27 ENCOUNTER — Ambulatory Visit: Payer: 59

## 2021-09-27 ENCOUNTER — Ambulatory Visit: Payer: 59 | Admitting: Internal Medicine

## 2021-09-27 ENCOUNTER — Other Ambulatory Visit: Payer: 59

## 2021-09-28 ENCOUNTER — Inpatient Hospital Stay: Payer: 59

## 2021-09-28 ENCOUNTER — Other Ambulatory Visit: Payer: Self-pay

## 2021-09-28 VITALS — BP 122/68 | HR 72 | Temp 97.6°F

## 2021-09-28 DIAGNOSIS — R202 Paresthesia of skin: Secondary | ICD-10-CM | POA: Diagnosis not present

## 2021-09-28 DIAGNOSIS — C187 Malignant neoplasm of sigmoid colon: Secondary | ICD-10-CM

## 2021-09-28 DIAGNOSIS — R5383 Other fatigue: Secondary | ICD-10-CM | POA: Diagnosis not present

## 2021-09-28 DIAGNOSIS — I129 Hypertensive chronic kidney disease with stage 1 through stage 4 chronic kidney disease, or unspecified chronic kidney disease: Secondary | ICD-10-CM | POA: Diagnosis not present

## 2021-09-28 DIAGNOSIS — R809 Proteinuria, unspecified: Secondary | ICD-10-CM | POA: Diagnosis not present

## 2021-09-28 DIAGNOSIS — I4819 Other persistent atrial fibrillation: Secondary | ICD-10-CM | POA: Diagnosis not present

## 2021-09-28 DIAGNOSIS — Z5189 Encounter for other specified aftercare: Secondary | ICD-10-CM | POA: Diagnosis not present

## 2021-09-28 DIAGNOSIS — Z5111 Encounter for antineoplastic chemotherapy: Secondary | ICD-10-CM | POA: Diagnosis not present

## 2021-09-28 DIAGNOSIS — C19 Malignant neoplasm of rectosigmoid junction: Secondary | ICD-10-CM | POA: Diagnosis not present

## 2021-09-28 DIAGNOSIS — R200 Anesthesia of skin: Secondary | ICD-10-CM | POA: Diagnosis not present

## 2021-09-28 MED ORDER — HEPARIN SOD (PORK) LOCK FLUSH 100 UNIT/ML IV SOLN
500.0000 [IU] | Freq: Once | INTRAVENOUS | Status: AC | PRN
  Administered 2021-09-28: 500 [IU]
  Filled 2021-09-28: qty 5

## 2021-09-28 MED ORDER — SODIUM CHLORIDE 0.9% FLUSH
10.0000 mL | INTRAVENOUS | Status: DC | PRN
  Administered 2021-09-28: 10 mL
  Filled 2021-09-28: qty 10

## 2021-09-28 MED ORDER — PEGFILGRASTIM INJECTION 6 MG/0.6ML ~~LOC~~
6.0000 mg | PREFILLED_SYRINGE | Freq: Once | SUBCUTANEOUS | Status: AC
  Administered 2021-09-28: 6 mg via SUBCUTANEOUS
  Filled 2021-09-28: qty 0.6

## 2021-10-02 ENCOUNTER — Other Ambulatory Visit: Payer: Self-pay

## 2021-10-09 MED FILL — Dexamethasone Sodium Phosphate Inj 100 MG/10ML: INTRAMUSCULAR | Qty: 1 | Status: AC

## 2021-10-10 ENCOUNTER — Inpatient Hospital Stay: Payer: 59 | Attending: Internal Medicine

## 2021-10-10 ENCOUNTER — Encounter: Payer: Self-pay | Admitting: Internal Medicine

## 2021-10-10 ENCOUNTER — Inpatient Hospital Stay: Payer: 59

## 2021-10-10 ENCOUNTER — Inpatient Hospital Stay (HOSPITAL_BASED_OUTPATIENT_CLINIC_OR_DEPARTMENT_OTHER): Payer: 59 | Admitting: Internal Medicine

## 2021-10-10 DIAGNOSIS — I429 Cardiomyopathy, unspecified: Secondary | ICD-10-CM | POA: Insufficient documentation

## 2021-10-10 DIAGNOSIS — R200 Anesthesia of skin: Secondary | ICD-10-CM | POA: Diagnosis not present

## 2021-10-10 DIAGNOSIS — I4819 Other persistent atrial fibrillation: Secondary | ICD-10-CM | POA: Diagnosis not present

## 2021-10-10 DIAGNOSIS — C189 Malignant neoplasm of colon, unspecified: Secondary | ICD-10-CM | POA: Diagnosis not present

## 2021-10-10 DIAGNOSIS — F17290 Nicotine dependence, other tobacco product, uncomplicated: Secondary | ICD-10-CM | POA: Diagnosis not present

## 2021-10-10 DIAGNOSIS — Z7901 Long term (current) use of anticoagulants: Secondary | ICD-10-CM | POA: Insufficient documentation

## 2021-10-10 DIAGNOSIS — Z905 Acquired absence of kidney: Secondary | ICD-10-CM | POA: Diagnosis not present

## 2021-10-10 DIAGNOSIS — Z79899 Other long term (current) drug therapy: Secondary | ICD-10-CM | POA: Diagnosis not present

## 2021-10-10 DIAGNOSIS — Z87891 Personal history of nicotine dependence: Secondary | ICD-10-CM | POA: Insufficient documentation

## 2021-10-10 DIAGNOSIS — Z87442 Personal history of urinary calculi: Secondary | ICD-10-CM | POA: Insufficient documentation

## 2021-10-10 DIAGNOSIS — Z833 Family history of diabetes mellitus: Secondary | ICD-10-CM | POA: Diagnosis not present

## 2021-10-10 DIAGNOSIS — E119 Type 2 diabetes mellitus without complications: Secondary | ICD-10-CM | POA: Insufficient documentation

## 2021-10-10 DIAGNOSIS — C187 Malignant neoplasm of sigmoid colon: Secondary | ICD-10-CM | POA: Diagnosis not present

## 2021-10-10 DIAGNOSIS — I10 Essential (primary) hypertension: Secondary | ICD-10-CM | POA: Diagnosis not present

## 2021-10-10 DIAGNOSIS — R5383 Other fatigue: Secondary | ICD-10-CM | POA: Insufficient documentation

## 2021-10-10 DIAGNOSIS — D696 Thrombocytopenia, unspecified: Secondary | ICD-10-CM | POA: Insufficient documentation

## 2021-10-10 DIAGNOSIS — Z8249 Family history of ischemic heart disease and other diseases of the circulatory system: Secondary | ICD-10-CM | POA: Insufficient documentation

## 2021-10-10 DIAGNOSIS — Z5111 Encounter for antineoplastic chemotherapy: Secondary | ICD-10-CM | POA: Insufficient documentation

## 2021-10-10 DIAGNOSIS — R809 Proteinuria, unspecified: Secondary | ICD-10-CM | POA: Insufficient documentation

## 2021-10-10 DIAGNOSIS — R040 Epistaxis: Secondary | ICD-10-CM | POA: Insufficient documentation

## 2021-10-10 DIAGNOSIS — R202 Paresthesia of skin: Secondary | ICD-10-CM | POA: Insufficient documentation

## 2021-10-10 DIAGNOSIS — D751 Secondary polycythemia: Secondary | ICD-10-CM | POA: Insufficient documentation

## 2021-10-10 LAB — CBC WITH DIFFERENTIAL/PLATELET
Abs Immature Granulocytes: 0.16 10*3/uL — ABNORMAL HIGH (ref 0.00–0.07)
Basophils Absolute: 0.1 10*3/uL (ref 0.0–0.1)
Basophils Relative: 1 %
Eosinophils Absolute: 0.2 10*3/uL (ref 0.0–0.5)
Eosinophils Relative: 3 %
HCT: 43.7 % (ref 39.0–52.0)
Hemoglobin: 14.5 g/dL (ref 13.0–17.0)
Immature Granulocytes: 3 %
Lymphocytes Relative: 14 %
Lymphs Abs: 0.8 10*3/uL (ref 0.7–4.0)
MCH: 31.3 pg (ref 26.0–34.0)
MCHC: 33.2 g/dL (ref 30.0–36.0)
MCV: 94.2 fL (ref 80.0–100.0)
Monocytes Absolute: 0.5 10*3/uL (ref 0.1–1.0)
Monocytes Relative: 8 %
Neutro Abs: 4.3 10*3/uL (ref 1.7–7.7)
Neutrophils Relative %: 71 %
Platelets: 104 10*3/uL — ABNORMAL LOW (ref 150–400)
RBC: 4.64 MIL/uL (ref 4.22–5.81)
RDW: 16.8 % — ABNORMAL HIGH (ref 11.5–15.5)
WBC: 6 10*3/uL (ref 4.0–10.5)
nRBC: 0 % (ref 0.0–0.2)

## 2021-10-10 LAB — COMPREHENSIVE METABOLIC PANEL
ALT: 27 U/L (ref 0–44)
AST: 26 U/L (ref 15–41)
Albumin: 4 g/dL (ref 3.5–5.0)
Alkaline Phosphatase: 111 U/L (ref 38–126)
Anion gap: 7 (ref 5–15)
BUN: 15 mg/dL (ref 6–20)
CO2: 24 mmol/L (ref 22–32)
Calcium: 8.7 mg/dL — ABNORMAL LOW (ref 8.9–10.3)
Chloride: 104 mmol/L (ref 98–111)
Creatinine, Ser: 1.23 mg/dL (ref 0.61–1.24)
GFR, Estimated: >60 mL/min (ref >60)
Glucose, Bld: 177 mg/dL — ABNORMAL HIGH (ref 70–99)
Potassium: 3.9 mmol/L (ref 3.5–5.1)
Sodium: 135 mmol/L (ref 135–145)
Total Bilirubin: 0.5 mg/dL (ref 0.3–1.2)
Total Protein: 7.5 g/dL (ref 6.5–8.1)

## 2021-10-10 MED ORDER — SODIUM CHLORIDE 0.9 % IV SOLN
10.0000 mg | Freq: Once | INTRAVENOUS | Status: AC
  Administered 2021-10-10: 10 mg via INTRAVENOUS
  Filled 2021-10-10: qty 10

## 2021-10-10 MED ORDER — DEXTROSE 5 % IV SOLN
Freq: Once | INTRAVENOUS | Status: AC
  Filled 2021-10-10: qty 250

## 2021-10-10 MED ORDER — PALONOSETRON HCL INJECTION 0.25 MG/5ML
0.2500 mg | Freq: Once | INTRAVENOUS | Status: AC
  Administered 2021-10-10: 0.25 mg via INTRAVENOUS
  Filled 2021-10-10: qty 5

## 2021-10-10 MED ORDER — OXALIPLATIN CHEMO INJECTION 100 MG/20ML
60.0000 mg/m2 | Freq: Once | INTRAVENOUS | Status: AC
  Administered 2021-10-10: 140 mg via INTRAVENOUS
  Filled 2021-10-10: qty 20

## 2021-10-10 MED ORDER — LEUCOVORIN CALCIUM INJECTION 350 MG
950.0000 mg | Freq: Once | INTRAVENOUS | Status: AC
  Administered 2021-10-10: 950 mg via INTRAVENOUS
  Filled 2021-10-10: qty 47.5

## 2021-10-10 MED ORDER — SODIUM CHLORIDE 0.9 % IV SOLN
2400.0000 mg/m2 | INTRAVENOUS | Status: DC
  Administered 2021-10-10: 5600 mg via INTRAVENOUS
  Filled 2021-10-10: qty 112

## 2021-10-10 MED ORDER — SODIUM CHLORIDE 0.9% FLUSH
10.0000 mL | INTRAVENOUS | Status: DC | PRN
  Administered 2021-10-10: 10 mL via INTRAVENOUS
  Filled 2021-10-10: qty 10
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
## 2021-10-10 NOTE — Assessment & Plan Note (Addendum)
#   Stage III colon cancer T3N2-rectosigmoid; distal 5 mm margin; MSS. currently on adjuvant chemo FOLFOX q 2w x12.   # Proceed with FOLFOX # 11/12 today... Labs today reviewed;  acceptable for treatment today.D-3 ziextenzo.  No further neutropenia noted on the booster injection.     #Thrombocytopenia-secondary to oxaliplatin; overall stable today platelets ~ 102. STABLE.   # Solitary kidney- GFR- creat- 1.26 [GFR- 60]; hx of acute renal failure [JAN 2023-creatinine-5.3]; monitor closely. STABLE.     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   # Hx of Afib-eliquis/amiodarone-monitor closely for bleeding tendencies on chemotherapy/htrombocytopenia- STABLE.   #  IV access:port- Functional.   # DISPOSITION:   # chemo today; pump off D-3; neulasta   # AS PLANNED-  follow up in 2 weeks/Wed- MD; labs-cbc/cmp; FOLFOX; D-3 pump off; neulasta- Dr.B

## 2021-10-10 NOTE — Patient Instructions (Signed)
[facility] CANCER CTR AT [place]  Discharge Instructions: Thank you for choosing [facility] to provide your oncology and hematology care.  If you have a lab appointment with the [facility], please go directly to the [facility] and check in at the registration area.  Wear comfortable clothing and clothing appropriate for easy access to any Portacath or PICC line.   We strive to give you quality time with your provider. You may need to reschedule your appointment if you arrive late (15 or more minutes).  Arriving late affects you and other patients whose appointments are after yours.  Also, if you miss three or more appointments without notifying the office, you may be dismissed from the clinic at the provider's discretion.      For prescription refill requests, have your pharmacy contact our office and allow 72 hours for refills to be completed.    Today you received the following chemotherapy and/or immunotherapy agents Oxaliplatin, Leucovorin & Adrucil      To help prevent nausea and vomiting after your treatment, we encourage you to take your nausea medication as directed.  BELOW ARE SYMPTOMS THAT SHOULD BE REPORTED IMMEDIATELY: *FEVER GREATER THAN 100.4 F (38 C) OR HIGHER *CHILLS OR SWEATING *NAUSEA AND VOMITING THAT IS NOT CONTROLLED WITH YOUR NAUSEA MEDICATION *UNUSUAL SHORTNESS OF BREATH *UNUSUAL BRUISING OR BLEEDING *URINARY PROBLEMS (pain or burning when urinating, or frequent urination) *BOWEL PROBLEMS (unusual diarrhea, constipation, pain near the anus) TENDERNESS IN MOUTH AND THROAT WITH OR WITHOUT PRESENCE OF ULCERS (sore throat, sores in mouth, or a toothache) UNUSUAL RASH, SWELLING OR PAIN  UNUSUAL VAGINAL DISCHARGE OR ITCHING   Items with * indicate a potential emergency and should be followed up as soon as possible or go to the Emergency Department if any problems should occur.  Please show the CHEMOTHERAPY ALERT CARD or IMMUNOTHERAPY  ALERT CARD at check-in to the Emergency Department and triage nurse.  Should you have questions after your visit or need to cancel or reschedule your appointment, please contact [facility] CANCER [place] AT [place]  [phone] and follow the prompts.  Office hours are 8:00 a.m. to 4:30 p.m. Monday - Friday. Please note that voicemails left after 4:00 p.m. may not be returned until the following business day.  We are closed weekends and major holidays. You have access to a nurse at all times for urgent questions. Please call the main number to the clinic [phone] and follow the prompts.  For any non-urgent questions, you may also contact your provider using MyChart. We now offer e-Visits for anyone 36 and older to request care online for non-urgent symptoms. For details visit [email or web address].   Also download the MyChart app! Go to the app store, search "MyChart", open the app, select [place], and log in with your MyChart username and password.  Masks are optional in the cancer centers. If you would like for your care team to wear a mask while they are taking care of you, please let them know. For doctor visits, patients may have with them one support person who is at least 56 years old. At this time, visitors are not allowed in the infusion area.

## 2021-10-11 ENCOUNTER — Ambulatory Visit: Payer: 59

## 2021-10-11 ENCOUNTER — Other Ambulatory Visit: Payer: Self-pay

## 2021-10-11 ENCOUNTER — Encounter: Payer: Self-pay | Admitting: Internal Medicine

## 2021-10-11 ENCOUNTER — Other Ambulatory Visit: Payer: Self-pay | Admitting: Internal Medicine

## 2021-10-11 ENCOUNTER — Other Ambulatory Visit: Payer: 59

## 2021-10-11 ENCOUNTER — Telehealth: Payer: Self-pay | Admitting: *Deleted

## 2021-10-11 ENCOUNTER — Ambulatory Visit: Payer: 59 | Admitting: Internal Medicine

## 2021-10-11 MED ORDER — PREDNISONE 20 MG PO TABS
20.0000 mg | ORAL_TABLET | Freq: Every day | ORAL | 0 refills | Status: DC
  Filled 2021-10-11: qty 7, 7d supply, fill #0

## 2021-10-11 NOTE — Telephone Encounter (Signed)
Pt.notified

## 2021-10-11 NOTE — Telephone Encounter (Signed)
Patient called asking for prescription to be sent in for him for either antibiotics or steroids because he has started bringing up white mucous like he does when his COPD is acting up. He states he wants to stop it before it gets bad. Please advise

## 2021-10-12 ENCOUNTER — Inpatient Hospital Stay: Payer: 59

## 2021-10-12 VITALS — BP 122/80 | HR 62 | Temp 97.7°F | Resp 18

## 2021-10-12 DIAGNOSIS — I4819 Other persistent atrial fibrillation: Secondary | ICD-10-CM | POA: Diagnosis not present

## 2021-10-12 DIAGNOSIS — R202 Paresthesia of skin: Secondary | ICD-10-CM | POA: Diagnosis not present

## 2021-10-12 DIAGNOSIS — E119 Type 2 diabetes mellitus without complications: Secondary | ICD-10-CM | POA: Diagnosis not present

## 2021-10-12 DIAGNOSIS — D751 Secondary polycythemia: Secondary | ICD-10-CM | POA: Diagnosis not present

## 2021-10-12 DIAGNOSIS — R200 Anesthesia of skin: Secondary | ICD-10-CM | POA: Diagnosis not present

## 2021-10-12 DIAGNOSIS — R809 Proteinuria, unspecified: Secondary | ICD-10-CM | POA: Diagnosis not present

## 2021-10-12 DIAGNOSIS — R5383 Other fatigue: Secondary | ICD-10-CM | POA: Diagnosis not present

## 2021-10-12 DIAGNOSIS — C187 Malignant neoplasm of sigmoid colon: Secondary | ICD-10-CM

## 2021-10-12 DIAGNOSIS — Z5111 Encounter for antineoplastic chemotherapy: Secondary | ICD-10-CM | POA: Diagnosis not present

## 2021-10-12 MED ORDER — SODIUM CHLORIDE 0.9% FLUSH
10.0000 mL | INTRAVENOUS | Status: DC | PRN
  Administered 2021-10-12: 10 mL
  Filled 2021-10-12: qty 10

## 2021-10-12 MED ORDER — HEPARIN SOD (PORK) LOCK FLUSH 100 UNIT/ML IV SOLN
500.0000 [IU] | Freq: Once | INTRAVENOUS | Status: AC | PRN
  Administered 2021-10-12: 500 [IU]
  Filled 2021-10-12: qty 5

## 2021-10-12 MED ORDER — PEGFILGRASTIM-BMEZ 6 MG/0.6ML ~~LOC~~ SOSY
6.0000 mg | PREFILLED_SYRINGE | Freq: Once | SUBCUTANEOUS | Status: AC
  Administered 2021-10-12: 6 mg via SUBCUTANEOUS
  Filled 2021-10-12: qty 0.6

## 2021-10-12 NOTE — Patient Instructions (Signed)
[facility] CANCER CTR AT [place]  Discharge Instructions: Thank you for choosing [facility] to provide your oncology and hematology care.  If you have a lab appointment with the [facility], please go directly to the [facility] and check in at the registration area.  Wear comfortable clothing and clothing appropriate for easy access to any Portacath or PICC line.   We strive to give you quality time with your provider. You may need to reschedule your appointment if you arrive late (15 or more minutes).  Arriving late affects you and other patients whose appointments are after yours.  Also, if you miss three or more appointments without notifying the office, you may be dismissed from the clinic at the provider's discretion.      For prescription refill requests, have your pharmacy contact our office and allow 72 hours for refills to be completed.    Today you received the following chemotherapy and/or immunotherapy agents pimp stop and Ziextenzo      To help prevent nausea and vomiting after your treatment, we encourage you to take your nausea medication as directed.  BELOW ARE SYMPTOMS THAT SHOULD BE REPORTED IMMEDIATELY: *FEVER GREATER THAN 100.4 F (38 C) OR HIGHER *CHILLS OR SWEATING *NAUSEA AND VOMITING THAT IS NOT CONTROLLED WITH YOUR NAUSEA MEDICATION *UNUSUAL SHORTNESS OF BREATH *UNUSUAL BRUISING OR BLEEDING *URINARY PROBLEMS (pain or burning when urinating, or frequent urination) *BOWEL PROBLEMS (unusual diarrhea, constipation, pain near the anus) TENDERNESS IN MOUTH AND THROAT WITH OR WITHOUT PRESENCE OF ULCERS (sore throat, sores in mouth, or a toothache) UNUSUAL RASH, SWELLING OR PAIN  UNUSUAL VAGINAL DISCHARGE OR ITCHING   Items with * indicate a potential emergency and should be followed up as soon as possible or go to the Emergency Department if any problems should occur.  Please show the CHEMOTHERAPY ALERT CARD or IMMUNOTHERAPY ALERT CARD at  check-in to the Emergency Department and triage nurse.  Should you have questions after your visit or need to cancel or reschedule your appointment, please contact [facility] CANCER [place] AT [place]  [phone] and follow the prompts.  Office hours are 8:00 a.m. to 4:30 p.m. Monday - Friday. Please note that voicemails left after 4:00 p.m. may not be returned until the following business day.  We are closed weekends and major holidays. You have access to a nurse at all times for urgent questions. Please call the main number to the clinic [phone] and follow the prompts.  For any non-urgent questions, you may also contact your provider using MyChart. We now offer e-Visits for anyone 66 and older to request care online for non-urgent symptoms. For details visit [email or web address].   Also download the MyChart app! Go to the app store, search "MyChart", open the app, select [place], and log in with your MyChart username and password.  Masks are optional in the cancer centers. If you would like for your care team to wear a mask while they are taking care of you, please let them know. For doctor visits, patients may have with them one support person who is at least 56 years old. At this time, visitors are not allowed in the infusion area.  Pegfilgrastim Injection What is this medication? PEGFILGRASTIM (PEG fil gra stim) lowers the risk of infection in people who are receiving chemotherapy. It works by Building control surveyor make more white blood cells, which protects your body from infection. It may also be used to help people who have been exposed to high  doses of radiation. This medicine may be used for other purposes; ask your health care provider or pharmacist if you have questions. COMMON BRAND NAME(S): [place], Neulasta, Nyvepria, Stimufend, UDENYCA, Ziextenzo What should I tell my care team before I take this medication? They need to know if you have any of these  conditions: Kidney disease Latex allergy Ongoing radiation therapy Sickle cell disease Skin reactions to acrylic adhesives (On-Body Injector only) An unusual or allergic reaction to pegfilgrastim, filgrastim, other medications, foods, dyes, or preservatives Pregnant or trying to get pregnant Breast-feeding How should I use this medication? This medication is for injection under the skin. If you get this medication at home, you will be taught how to prepare and give the pre-filled syringe or how to use the On-body Injector. Refer to the patient Instructions for Use for detailed instructions. Use exactly as directed. Tell your care team immediately if you suspect that the On-body Injector may not have performed as intended or if you suspect the use of the On-body Injector resulted in a missed or partial dose. It is important that you put your used needles and syringes in a special sharps container. Do not put them in a trash can. If you do not have a sharps container, call your pharmacist or care team to get one. Talk to your care team about the use of this medication in children. While this medication may be prescribed for selected conditions, precautions do apply. Overdosage: If you think you have taken too much of this medicine contact a poison control center or emergency room at once. NOTE: This medicine is only for you. Do not share this medicine with others. What if I miss a dose? It is important not to miss your dose. Call your care team if you miss your dose. If you miss a dose due to an On-body Injector failure or leakage, a new dose should be administered as soon as possible using a single prefilled syringe for manual use. What may interact with this medication? Interactions have not been studied. This list may not describe all possible interactions. Give your health care provider a list of all the medicines, herbs, non-prescription drugs, or dietary supplements you use. Also tell them if  you smoke, drink alcohol, or use illegal drugs. Some items may interact with your medicine. What should I watch for while using this medication? Your condition will be monitored carefully while you are receiving this medication. You may need blood work done while you are taking this medication. Talk to your care team about your risk of cancer. You may be more at risk for certain types of cancer if you take this medication. If you are going to need a MRI, CT scan, or other procedure, tell your care team that you are using this medication (On-Body Injector only). What side effects may I notice from receiving this medication? Side effects that you should report to your care team as soon as possible: Allergic reactions--skin rash, itching, hives, swelling of the face, lips, tongue, or throat Capillary leak syndrome--stomach or muscle pain, unusual weakness or fatigue, feeling faint or lightheaded, decrease in the amount of urine, swelling of the ankles, hands, or feet, trouble breathing High white blood cell level--fever, fatigue, trouble breathing, night sweats, change in vision, weight loss Inflammation of the aorta--fever, fatigue, back, chest, or stomach pain, severe headache Kidney injury (glomerulonephritis)--decrease in the amount of urine, red or dark brown urine, foamy or bubbly urine, swelling of the ankles, hands, or feet  Shortness of breath or trouble breathing Spleen injury--pain in upper left stomach or shoulder Unusual bruising or bleeding Side effects that usually do not require medical attention (report to your care team if they continue or are bothersome): Bone pain Pain in the hands or feet This list may not describe all possible side effects. Call your doctor for medical advice about side effects. You may report side effects to FDA at 1-800-FDA-1088. Where should I keep my medication? Keep out of the reach of children. If you are using this medication at home, you will be  instructed on how to store it. Throw away any unused medication after the expiration date on the label. NOTE: This sheet is a summary. It may not cover all possible information. If you have questions about this medicine, talk to your doctor, pharmacist, or health care provider.  2023 Elsevier/Gold Standard (2013-05-29 00:00:00)

## 2021-10-23 DIAGNOSIS — C189 Malignant neoplasm of colon, unspecified: Secondary | ICD-10-CM | POA: Diagnosis not present

## 2021-10-24 ENCOUNTER — Other Ambulatory Visit: Payer: 59

## 2021-10-24 ENCOUNTER — Ambulatory Visit: Payer: 59

## 2021-10-24 ENCOUNTER — Ambulatory Visit: Payer: 59 | Admitting: Internal Medicine

## 2021-10-24 ENCOUNTER — Other Ambulatory Visit: Payer: Self-pay

## 2021-10-25 ENCOUNTER — Inpatient Hospital Stay: Payer: 59 | Admitting: Internal Medicine

## 2021-10-25 ENCOUNTER — Inpatient Hospital Stay: Payer: 59

## 2021-10-27 MED FILL — Dexamethasone Sodium Phosphate Inj 100 MG/10ML: INTRAMUSCULAR | Qty: 1 | Status: AC

## 2021-10-29 NOTE — Assessment & Plan Note (Addendum)
#   Stage III colon cancer T3N2-rectosigmoid; distal 5 mm margin; MSS. currently on adjuvant chemo FOLFOX q 2w x12.   # Proceed with FOLFOX # 12/12 today.  Last cycle of adjuvant chemotherapy today.... Labs today reviewed;  acceptable for treatment today.D-3 ziextenzo.  No further neutropenia noted on the booster injection.     #Thrombocytopenia-secondary to oxaliplatin; overall stable today platelets ~ 102. STABLE.   # Solitary kidney- GFR- creat- 1.26 [GFR- 60]; hx of acute renal failure [JAN 2023-creatinine-5.3]; monitor closely. STABLE.     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   # Hx of Afib-eliquis/amiodarone-monitor closely for bleeding tendencies on chemotherapy/htrombocytopenia- STABLE.   #  IV access:port- Functional.   # DISPOSITION:   # chemo today; pump off D-3; neulasta   # AS PLANNED-  follow up in 2 weeks/Wed- MD; labs-cbc/cmp; FOLFOX; D-3 pump off; neulasta..- Dr.B
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
## 2021-10-30 ENCOUNTER — Ambulatory Visit: Payer: 59 | Admitting: Surgery

## 2021-10-30 ENCOUNTER — Other Ambulatory Visit: Payer: 59

## 2021-10-30 ENCOUNTER — Inpatient Hospital Stay: Payer: 59

## 2021-10-30 ENCOUNTER — Encounter: Payer: Self-pay | Admitting: Internal Medicine

## 2021-10-30 ENCOUNTER — Inpatient Hospital Stay (HOSPITAL_BASED_OUTPATIENT_CLINIC_OR_DEPARTMENT_OTHER): Payer: 59 | Admitting: Internal Medicine

## 2021-10-30 DIAGNOSIS — R809 Proteinuria, unspecified: Secondary | ICD-10-CM | POA: Diagnosis not present

## 2021-10-30 DIAGNOSIS — C187 Malignant neoplasm of sigmoid colon: Secondary | ICD-10-CM | POA: Diagnosis not present

## 2021-10-30 DIAGNOSIS — D751 Secondary polycythemia: Secondary | ICD-10-CM | POA: Diagnosis not present

## 2021-10-30 DIAGNOSIS — R200 Anesthesia of skin: Secondary | ICD-10-CM | POA: Diagnosis not present

## 2021-10-30 DIAGNOSIS — R202 Paresthesia of skin: Secondary | ICD-10-CM | POA: Diagnosis not present

## 2021-10-30 DIAGNOSIS — C189 Malignant neoplasm of colon, unspecified: Secondary | ICD-10-CM | POA: Diagnosis not present

## 2021-10-30 DIAGNOSIS — Z5111 Encounter for antineoplastic chemotherapy: Secondary | ICD-10-CM | POA: Diagnosis not present

## 2021-10-30 DIAGNOSIS — E119 Type 2 diabetes mellitus without complications: Secondary | ICD-10-CM | POA: Diagnosis not present

## 2021-10-30 DIAGNOSIS — I4819 Other persistent atrial fibrillation: Secondary | ICD-10-CM | POA: Diagnosis not present

## 2021-10-30 DIAGNOSIS — R5383 Other fatigue: Secondary | ICD-10-CM | POA: Diagnosis not present

## 2021-10-30 LAB — COMPREHENSIVE METABOLIC PANEL
ALT: 26 U/L (ref 0–44)
AST: 26 U/L (ref 15–41)
Albumin: 3.6 g/dL (ref 3.5–5.0)
Alkaline Phosphatase: 81 U/L (ref 38–126)
Anion gap: 6 (ref 5–15)
BUN: 15 mg/dL (ref 6–20)
CO2: 24 mmol/L (ref 22–32)
Calcium: 8.4 mg/dL — ABNORMAL LOW (ref 8.9–10.3)
Chloride: 105 mmol/L (ref 98–111)
Creatinine, Ser: 1.17 mg/dL (ref 0.61–1.24)
GFR, Estimated: >60 mL/min (ref >60)
Glucose, Bld: 253 mg/dL — ABNORMAL HIGH (ref 70–99)
Potassium: 3.8 mmol/L (ref 3.5–5.1)
Sodium: 135 mmol/L (ref 135–145)
Total Bilirubin: 0.8 mg/dL (ref 0.3–1.2)
Total Protein: 7.2 g/dL (ref 6.5–8.1)

## 2021-10-30 LAB — CBC WITH DIFFERENTIAL/PLATELET
Abs Immature Granulocytes: 0.21 10*3/uL — ABNORMAL HIGH (ref 0.00–0.07)
Basophils Absolute: 0.1 10*3/uL (ref 0.0–0.1)
Basophils Relative: 1 %
Eosinophils Absolute: 0.1 10*3/uL (ref 0.0–0.5)
Eosinophils Relative: 1 %
HCT: 39.5 % (ref 39.0–52.0)
Hemoglobin: 13 g/dL (ref 13.0–17.0)
Immature Granulocytes: 3 %
Lymphocytes Relative: 12 %
Lymphs Abs: 0.7 10*3/uL (ref 0.7–4.0)
MCH: 31.5 pg (ref 26.0–34.0)
MCHC: 32.9 g/dL (ref 30.0–36.0)
MCV: 95.6 fL (ref 80.0–100.0)
Monocytes Absolute: 0.5 10*3/uL (ref 0.1–1.0)
Monocytes Relative: 8 %
Neutro Abs: 4.8 10*3/uL (ref 1.7–7.7)
Neutrophils Relative %: 75 %
Platelets: 106 10*3/uL — ABNORMAL LOW (ref 150–400)
RBC: 4.13 MIL/uL — ABNORMAL LOW (ref 4.22–5.81)
RDW: 17.7 % — ABNORMAL HIGH (ref 11.5–15.5)
WBC: 6.4 10*3/uL (ref 4.0–10.5)
nRBC: 0 % (ref 0.0–0.2)

## 2021-10-30 MED ORDER — SODIUM CHLORIDE 0.9 % IV SOLN
10.0000 mg | Freq: Once | INTRAVENOUS | Status: AC
  Administered 2021-10-30: 10 mg via INTRAVENOUS
  Filled 2021-10-30: qty 10

## 2021-10-30 MED ORDER — OXALIPLATIN CHEMO INJECTION 100 MG/20ML
60.0000 mg/m2 | Freq: Once | INTRAVENOUS | Status: AC
  Administered 2021-10-30: 140 mg via INTRAVENOUS
  Filled 2021-10-30: qty 10

## 2021-10-30 MED ORDER — PALONOSETRON HCL INJECTION 0.25 MG/5ML
0.2500 mg | Freq: Once | INTRAVENOUS | Status: AC
  Administered 2021-10-30: 0.25 mg via INTRAVENOUS
  Filled 2021-10-30: qty 5

## 2021-10-30 MED ORDER — DEXTROSE 5 % IV SOLN
Freq: Once | INTRAVENOUS | Status: AC
  Filled 2021-10-30: qty 250

## 2021-10-30 MED ORDER — SODIUM CHLORIDE 0.9 % IV SOLN
2400.0000 mg/m2 | INTRAVENOUS | Status: DC
  Administered 2021-10-30: 5600 mg via INTRAVENOUS
  Filled 2021-10-30: qty 100

## 2021-10-30 MED ORDER — LEUCOVORIN CALCIUM INJECTION 350 MG
950.0000 mg | Freq: Once | INTRAVENOUS | Status: AC
  Administered 2021-10-30: 950 mg via INTRAVENOUS
  Filled 2021-10-30: qty 47.5

## 2021-10-30 NOTE — Patient Instructions (Signed)
[facility] AT [place]  Discharge Instructions: Thank you for choosing [facility] to provide your oncology and hematology care.  If you have a lab appointment with the [facility], please go directly to the [facility] and check in at the registration area.  Wear comfortable clothing and clothing appropriate for easy access to any Portacath or PICC line.   We strive to give you quality time with your provider. You may need to reschedule your appointment if you arrive late (15 or more minutes).  Arriving late affects you and other patients whose appointments are after yours.  Also, if you miss three or more appointments without notifying the office, you may be dismissed from the clinic at the provider's discretion.      For prescription refill requests, have your pharmacy contact our office and allow 72 hours for refills to be completed.    Today you received the following chemotherapy and/or immunotherapy agents: OXaliplatin, Adrucil      To help prevent nausea and vomiting after your treatment, we encourage you to take your nausea medication as directed.  BELOW ARE SYMPTOMS THAT SHOULD BE REPORTED IMMEDIATELY: *FEVER GREATER THAN 100.4 F (38 C) OR HIGHER *CHILLS OR SWEATING *NAUSEA AND VOMITING THAT IS NOT CONTROLLED WITH YOUR NAUSEA MEDICATION *UNUSUAL SHORTNESS OF BREATH *UNUSUAL BRUISING OR BLEEDING *URINARY PROBLEMS (pain or burning when urinating, or frequent urination) *BOWEL PROBLEMS (unusual diarrhea, constipation, pain near the anus) TENDERNESS IN MOUTH AND THROAT WITH OR WITHOUT PRESENCE OF ULCERS (sore throat, sores in mouth, or a toothache) UNUSUAL RASH, SWELLING OR PAIN  UNUSUAL VAGINAL DISCHARGE OR ITCHING   Items with * indicate a potential emergency and should be followed up as soon as possible or go to the Emergency Department if any problems should occur.  Please show the CHEMOTHERAPY ALERT CARD or IMMUNOTHERAPY ALERT CARD at  check-in to the Emergency Department and triage nurse.  Should you have questions after your visit or need to cancel or reschedule your appointment, please contact [facility] CANCER [place] AT [place]  [phone] and follow the prompts.  Office hours are 8:00 a.m. to 4:30 p.m. Monday - Friday. Please note that voicemails left after 4:00 p.m. may not be returned until the following business day.  We are closed weekends and major holidays. You have access to a nurse at all times for urgent questions. Please call the main number to the clinic [phone] and follow the prompts.  For any non-urgent questions, you may also contact your provider using MyChart. We now offer e-Visits for anyone 47 and older to request care online for non-urgent symptoms. For details visit [email or web address].   Also download the MyChart app! Go to the app store, search "MyChart", open the app, select [facility], and log in with your MyChart username and password.  Masks are optional in the cancer centers. If you would like for your care team to wear a mask while they are taking care of you, please let them know. For doctor visits, patients may have with them one support person who is at least 56 years old. At this time, visitors are not allowed in the infusion area.

## 2021-10-30 NOTE — Progress Notes (Signed)
Patient concerned with Eliquis dosing due to easy bleeding.  Had 2 nosebleeds over the weekend that was difficulty to stop.  Neuropathy stable.

## 2021-11-01 ENCOUNTER — Inpatient Hospital Stay: Payer: 59

## 2021-11-01 ENCOUNTER — Other Ambulatory Visit: Payer: Self-pay

## 2021-11-01 DIAGNOSIS — D751 Secondary polycythemia: Secondary | ICD-10-CM | POA: Diagnosis not present

## 2021-11-01 DIAGNOSIS — R809 Proteinuria, unspecified: Secondary | ICD-10-CM | POA: Diagnosis not present

## 2021-11-01 DIAGNOSIS — R200 Anesthesia of skin: Secondary | ICD-10-CM | POA: Diagnosis not present

## 2021-11-01 DIAGNOSIS — I4819 Other persistent atrial fibrillation: Secondary | ICD-10-CM | POA: Diagnosis not present

## 2021-11-01 DIAGNOSIS — C187 Malignant neoplasm of sigmoid colon: Secondary | ICD-10-CM

## 2021-11-01 DIAGNOSIS — R202 Paresthesia of skin: Secondary | ICD-10-CM | POA: Diagnosis not present

## 2021-11-01 DIAGNOSIS — E119 Type 2 diabetes mellitus without complications: Secondary | ICD-10-CM | POA: Diagnosis not present

## 2021-11-01 DIAGNOSIS — Z5111 Encounter for antineoplastic chemotherapy: Secondary | ICD-10-CM | POA: Diagnosis not present

## 2021-11-01 DIAGNOSIS — R5383 Other fatigue: Secondary | ICD-10-CM | POA: Diagnosis not present

## 2021-11-01 MED ORDER — HEPARIN SOD (PORK) LOCK FLUSH 100 UNIT/ML IV SOLN
500.0000 [IU] | Freq: Once | INTRAVENOUS | Status: AC | PRN
  Administered 2021-11-01: 500 [IU]
  Filled 2021-11-01: qty 5

## 2021-11-01 MED ORDER — PEGFILGRASTIM INJECTION 6 MG/0.6ML ~~LOC~~
6.0000 mg | PREFILLED_SYRINGE | Freq: Once | SUBCUTANEOUS | Status: AC
  Administered 2021-11-01: 6 mg via SUBCUTANEOUS

## 2021-11-01 MED ORDER — SODIUM CHLORIDE 0.9% FLUSH
10.0000 mL | INTRAVENOUS | Status: DC | PRN
  Administered 2021-11-01: 10 mL
  Filled 2021-11-01: qty 10

## 2021-11-01 MED ORDER — ALBUTEROL SULFATE HFA 108 (90 BASE) MCG/ACT IN AERS
INHALATION_SPRAY | RESPIRATORY_TRACT | 11 refills | Status: DC
  Filled 2021-11-01 – 2021-11-22 (×2): qty 6.7, 25d supply, fill #0
  Filled 2022-02-21: qty 6.7, 25d supply, fill #1
  Filled 2022-08-06: qty 6.7, 25d supply, fill #2
  Filled 2022-10-22: qty 6.7, 25d supply, fill #3

## 2021-11-01 MED ORDER — FLUTICASONE PROPIONATE HFA 110 MCG/ACT IN AERO
INHALATION_SPRAY | RESPIRATORY_TRACT | 12 refills | Status: DC
Start: 2021-11-01 — End: 2022-02-21
  Filled 2021-11-01 – 2021-11-22 (×2): qty 12, 30d supply, fill #0

## 2021-11-02 ENCOUNTER — Telehealth: Payer: Self-pay

## 2021-11-02 NOTE — Telephone Encounter (Signed)
-----   Message from Minna Merritts, MD sent at 10/30/2021 12:32 PM EDT ----- Received phone call from otology oncology concerning his frequent epistaxis He is on Eliquis for atrial fibrillation Coming off Eliquis may be difficult As he consider seeing ENT, they may be able to cauterize something to stop the bleeding If that is not an option, perhaps we can set him up with EP Quentin Ore for consideration of Watchman device Thx TG

## 2021-11-02 NOTE — Telephone Encounter (Signed)
Left message for pt to call back  °

## 2021-11-03 ENCOUNTER — Other Ambulatory Visit: Payer: Self-pay

## 2021-11-03 NOTE — Telephone Encounter (Signed)
Spoke w/ pt.   Advised him of Dr. Donivan Scull recommendation. He has appt on 12/08/21 w/ Dr. Rockey Situ and will discuss further at that time. He is appreciative of the call.

## 2021-11-09 ENCOUNTER — Ambulatory Visit: Payer: 59 | Admitting: *Deleted

## 2021-11-17 ENCOUNTER — Other Ambulatory Visit: Payer: Self-pay

## 2021-11-20 ENCOUNTER — Ambulatory Visit: Payer: 59 | Admitting: Surgery

## 2021-11-22 ENCOUNTER — Other Ambulatory Visit: Payer: Self-pay

## 2021-11-24 ENCOUNTER — Other Ambulatory Visit: Payer: Self-pay

## 2021-11-27 DIAGNOSIS — G4733 Obstructive sleep apnea (adult) (pediatric): Secondary | ICD-10-CM | POA: Diagnosis not present

## 2021-11-30 ENCOUNTER — Ambulatory Visit: Payer: 59 | Admitting: Nurse Practitioner

## 2021-11-30 ENCOUNTER — Ambulatory Visit: Payer: 59 | Admitting: Internal Medicine

## 2021-11-30 ENCOUNTER — Other Ambulatory Visit: Payer: 59

## 2021-12-07 ENCOUNTER — Encounter: Payer: Self-pay | Admitting: Internal Medicine

## 2021-12-07 NOTE — Telephone Encounter (Signed)
Encounter 

## 2021-12-08 ENCOUNTER — Inpatient Hospital Stay: Payer: 59 | Attending: Internal Medicine

## 2021-12-08 ENCOUNTER — Inpatient Hospital Stay (HOSPITAL_BASED_OUTPATIENT_CLINIC_OR_DEPARTMENT_OTHER): Payer: 59 | Admitting: Nurse Practitioner

## 2021-12-08 ENCOUNTER — Encounter: Payer: Self-pay | Admitting: Nurse Practitioner

## 2021-12-08 ENCOUNTER — Encounter: Payer: Self-pay | Admitting: Cardiovascular Disease

## 2021-12-08 ENCOUNTER — Ambulatory Visit: Payer: 59 | Admitting: Primary Care

## 2021-12-08 ENCOUNTER — Ambulatory Visit: Payer: 59 | Attending: Cardiovascular Disease | Admitting: Cardiovascular Disease

## 2021-12-08 VITALS — BP 120/64 | HR 72 | Ht 71.0 in | Wt 246.4 lb

## 2021-12-08 VITALS — BP 115/54 | HR 75 | Temp 97.8°F | Resp 16 | Ht 72.0 in | Wt 246.5 lb

## 2021-12-08 DIAGNOSIS — C187 Malignant neoplasm of sigmoid colon: Secondary | ICD-10-CM | POA: Insufficient documentation

## 2021-12-08 DIAGNOSIS — I480 Paroxysmal atrial fibrillation: Secondary | ICD-10-CM | POA: Diagnosis not present

## 2021-12-08 DIAGNOSIS — E118 Type 2 diabetes mellitus with unspecified complications: Secondary | ICD-10-CM

## 2021-12-08 DIAGNOSIS — Z9221 Personal history of antineoplastic chemotherapy: Secondary | ICD-10-CM | POA: Insufficient documentation

## 2021-12-08 DIAGNOSIS — Z833 Family history of diabetes mellitus: Secondary | ICD-10-CM | POA: Insufficient documentation

## 2021-12-08 DIAGNOSIS — Z87891 Personal history of nicotine dependence: Secondary | ICD-10-CM | POA: Diagnosis not present

## 2021-12-08 DIAGNOSIS — Z87442 Personal history of urinary calculi: Secondary | ICD-10-CM | POA: Diagnosis not present

## 2021-12-08 DIAGNOSIS — R809 Proteinuria, unspecified: Secondary | ICD-10-CM | POA: Diagnosis not present

## 2021-12-08 DIAGNOSIS — I420 Dilated cardiomyopathy: Secondary | ICD-10-CM | POA: Diagnosis not present

## 2021-12-08 DIAGNOSIS — I10 Essential (primary) hypertension: Secondary | ICD-10-CM

## 2021-12-08 DIAGNOSIS — E119 Type 2 diabetes mellitus without complications: Secondary | ICD-10-CM | POA: Diagnosis not present

## 2021-12-08 DIAGNOSIS — I5022 Chronic systolic (congestive) heart failure: Secondary | ICD-10-CM

## 2021-12-08 DIAGNOSIS — D696 Thrombocytopenia, unspecified: Secondary | ICD-10-CM | POA: Diagnosis not present

## 2021-12-08 DIAGNOSIS — Z8249 Family history of ischemic heart disease and other diseases of the circulatory system: Secondary | ICD-10-CM | POA: Insufficient documentation

## 2021-12-08 DIAGNOSIS — Z7901 Long term (current) use of anticoagulants: Secondary | ICD-10-CM | POA: Diagnosis not present

## 2021-12-08 DIAGNOSIS — Z86711 Personal history of pulmonary embolism: Secondary | ICD-10-CM | POA: Diagnosis not present

## 2021-12-08 DIAGNOSIS — I4819 Other persistent atrial fibrillation: Secondary | ICD-10-CM | POA: Diagnosis not present

## 2021-12-08 DIAGNOSIS — K56609 Unspecified intestinal obstruction, unspecified as to partial versus complete obstruction: Secondary | ICD-10-CM | POA: Diagnosis not present

## 2021-12-08 DIAGNOSIS — I429 Cardiomyopathy, unspecified: Secondary | ICD-10-CM | POA: Diagnosis not present

## 2021-12-08 DIAGNOSIS — E782 Mixed hyperlipidemia: Secondary | ICD-10-CM

## 2021-12-08 DIAGNOSIS — Z905 Acquired absence of kidney: Secondary | ICD-10-CM | POA: Insufficient documentation

## 2021-12-08 DIAGNOSIS — N1831 Chronic kidney disease, stage 3a: Secondary | ICD-10-CM

## 2021-12-08 DIAGNOSIS — R040 Epistaxis: Secondary | ICD-10-CM

## 2021-12-08 DIAGNOSIS — Z79899 Other long term (current) drug therapy: Secondary | ICD-10-CM | POA: Diagnosis not present

## 2021-12-08 DIAGNOSIS — D751 Secondary polycythemia: Secondary | ICD-10-CM | POA: Diagnosis not present

## 2021-12-08 LAB — CBC WITH DIFFERENTIAL/PLATELET
Abs Immature Granulocytes: 0.02 10*3/uL (ref 0.00–0.07)
Basophils Absolute: 0.1 10*3/uL (ref 0.0–0.1)
Basophils Relative: 1 %
Eosinophils Absolute: 0.4 10*3/uL (ref 0.0–0.5)
Eosinophils Relative: 8 %
HCT: 36.1 % — ABNORMAL LOW (ref 39.0–52.0)
Hemoglobin: 12.1 g/dL — ABNORMAL LOW (ref 13.0–17.0)
Immature Granulocytes: 1 %
Lymphocytes Relative: 19 %
Lymphs Abs: 0.8 10*3/uL (ref 0.7–4.0)
MCH: 30.2 pg (ref 26.0–34.0)
MCHC: 33.5 g/dL (ref 30.0–36.0)
MCV: 90 fL (ref 80.0–100.0)
Monocytes Absolute: 0.4 10*3/uL (ref 0.1–1.0)
Monocytes Relative: 9 %
Neutro Abs: 2.7 10*3/uL (ref 1.7–7.7)
Neutrophils Relative %: 62 %
Platelets: 117 10*3/uL — ABNORMAL LOW (ref 150–400)
RBC: 4.01 MIL/uL — ABNORMAL LOW (ref 4.22–5.81)
RDW: 13.9 % (ref 11.5–15.5)
WBC: 4.3 10*3/uL (ref 4.0–10.5)
nRBC: 0 % (ref 0.0–0.2)

## 2021-12-08 LAB — COMPREHENSIVE METABOLIC PANEL
ALT: 37 U/L (ref 0–44)
AST: 31 U/L (ref 15–41)
Albumin: 3.8 g/dL (ref 3.5–5.0)
Alkaline Phosphatase: 73 U/L (ref 38–126)
Anion gap: 3 — ABNORMAL LOW (ref 5–15)
BUN: 16 mg/dL (ref 6–20)
CO2: 24 mmol/L (ref 22–32)
Calcium: 8.1 mg/dL — ABNORMAL LOW (ref 8.9–10.3)
Chloride: 107 mmol/L (ref 98–111)
Creatinine, Ser: 1.31 mg/dL — ABNORMAL HIGH (ref 0.61–1.24)
GFR, Estimated: >60 mL/min (ref >60)
Glucose, Bld: 215 mg/dL — ABNORMAL HIGH (ref 70–99)
Potassium: 3.4 mmol/L — ABNORMAL LOW (ref 3.5–5.1)
Sodium: 134 mmol/L — ABNORMAL LOW (ref 135–145)
Total Bilirubin: 0.7 mg/dL (ref 0.3–1.2)
Total Protein: 7.1 g/dL (ref 6.5–8.1)

## 2021-12-08 MED ORDER — SODIUM CHLORIDE 0.9% FLUSH
10.0000 mL | INTRAVENOUS | Status: DC | PRN
  Administered 2021-12-08: 10 mL via INTRAVENOUS
  Filled 2021-12-08: qty 10

## 2021-12-08 MED ORDER — BISOPROLOL FUMARATE 5 MG PO TABS: 2.5000 mg | ORAL_TABLET | ORAL | Status: DC | PRN

## 2021-12-08 MED ORDER — HEPARIN SOD (PORK) LOCK FLUSH 100 UNIT/ML IV SOLN
500.0000 [IU] | Freq: Once | INTRAVENOUS | Status: AC
  Administered 2021-12-08: 500 [IU] via INTRAVENOUS
  Filled 2021-12-08: qty 5

## 2021-12-08 NOTE — Progress Notes (Signed)
Cardiology Office Note  Date:  12/08/2021   ID:  Torren Maffeo Bradstreet, DOB 17-Mar-1965, MRN [id number]  PCP:  [name], PA-C   Chief Complaint  Patient presents with   6 month follow up      Patient c/o excessive nosebleeds and would like to know what to do with being on Eliquis. Patient will follow up with ENT to see about a polyp in his nose. Medications reviewed by the patient verbally.      HPI:  Mr. Mayra Brahm is a 56 year old gentleman with past medical history of  smoking, Quit smoking 1 year diabetes type 2 polycythemia,  hypertension,  hyperlipidemia,  borderline diabetes  presenting to the hospital with with acute respiratory distress, atrial fibrillation with RVR   Colon cancer, surgery OSA on CPAP Underwent TEE cardioversion July 07, 2020, EF 30 to 35% in A-fib Normalized ejection fraction July 2022 presents for routine follow-up of his atrial fibrillation  LOV  with myself March 2023 In follow-up today reports he has had significant difficulty with nosebleeds over the past month Reports having nosebleeds lasting 1 hour up to 1.5 hours up to 3 times a day Grossly bloody, high volume Seen by primary care, he reports referral been placed to ENT  Lab work reviewed HGB 15 down to 12  He denies tachypalpitations concerning for atrial fibrillation Has been taking amiodarone, Eliquis 5 twice daily, not on beta-blocker He was previously on bisoprolol but this was held in the setting of hypotension after weight loss  EKG personally reviewed by myself on todays visit Normal sinus rhythm rate 72 bpm with no significant ST-T wave changes  History of colon cancer and treatment 12/22: colonscopy: mass noted, Stage III colon cancer Surgery for resection 7/86 Complications of small obstruction resolved /treated conservatively.   FEB 14th, 2023- FOLFOX Has an ostomy   "Lost 35 pounds"  Admitted with bronchopneumonia April 2022, started on antibiotics,  nebulizers History of smoking Atrial fibrillation with RVR Cardiomyopathy, ejection fraction 35 to 40% , felt secondary to cardiac arrhythmia though unable to exclude ischemia  Echocardiogram July 07, 2020 prior to cardioversion  1. Left ventricular ejection fraction, by estimation, is 35 to 40%. The  left ventricle has moderately decreased function. The left ventricle  demonstrates global hypokinesis. The left ventricular internal cavity size  was mildly dilated. Left ventricular  diastolic parameters are indeterminate.   2. Right ventricular systolic function is normal. The right ventricular  size is normal. There is normal pulmonary artery systolic pressure.   3. Left atrial size was mildly dilated.   Underwent TEE cardioversion July 07, 2020  PMH:   has a past medical history of Anemia, Asthma, persistent not controlled, Cancer ([place]), Cardiomyopathy ([place]), CKD (chronic kidney disease), stage III ([place]), Difficult intubation, Dysrhythmia, Essential hypertension, History of kidney stones, History of nephrectomy, unilateral, Hyperlipidemia, Morbid obesity ([place]), Persistent atrial fibrillation ([place]), Pneumonia, Polycythemia, Sleep apnea, Tobacco abuse, and Type II diabetes mellitus ([place]).  PSH:    Past Surgical History:  Procedure Laterality Date   CARDIOVERSION N/A 07/08/2020   Procedure: CARDIOVERSION;  Surgeon: Minna Merritts, MD;  Location: [facility] ORS;  Service: Cardiovascular;  Laterality: N/A;   CARDIOVERSION N/A 08/05/2020   Procedure: CARDIOVERSION;  Surgeon: Minna Merritts, MD;  Location: [facility] ORS;  Service: Cardiovascular;  Laterality: N/A;   COLON RESECTION  03/23/2021   COLONOSCOPY N/A 03/09/2021   Procedure: COLONOSCOPY;  Surgeon: Virgel Manifold, MD;  Location: [facility] ENDOSCOPY;  Service: Endoscopy;  Laterality:  N/A;   ESOPHAGOGASTRODUODENOSCOPY N/A 03/09/2021   Procedure: ESOPHAGOGASTRODUODENOSCOPY (EGD);  Surgeon: Virgel Manifold, MD;  Location: [facility]  ENDOSCOPY;  Service: Endoscopy;  Laterality: N/A;   ILEOSTOMY CLOSURE N/A 06/27/2021   Procedure: ILEOSTOMY TAKEDOWN;  Surgeon: Jules Husbands, MD;  Location: [facility] ORS;  Service: General;  Laterality: N/A;   NEPHRECTOMY Right    PORTACATH PLACEMENT N/A 04/13/2021   Procedure: INSERTION PORT-A-CATH;  Surgeon: Jules Husbands, MD;  Location: [facility] ORS;  Service: General;  Laterality: N/A;  Provider requesting 1 hour / 60 minutes for procedure.   TEE WITHOUT CARDIOVERSION N/A 07/08/2020   Procedure: TRANSESOPHAGEAL ECHOCARDIOGRAM (TEE);  Surgeon: Minna Merritts, MD;  Location: [facility] ORS;  Service: Cardiovascular;  Laterality: N/A;   TONSILLECTOMY      Current Outpatient Medications  Medication Sig Dispense Refill   albuterol (VENTOLIN HFA) 108 (90 Base) MCG/ACT inhaler Inhale 2 puffs into the lungs 4 times a day as needed for shortness of breath, wheezing and cough 6.7 g 11   amiodarone (PACERONE) 200 MG tablet Take 1 tablet (200 mg total) by mouth daily. Hold for HR less then 50 (Patient taking differently: Take 200 mg by mouth at bedtime. Hold for HR less then 50) 90 tablet 3   apixaban (ELIQUIS) 5 MG TABS tablet Take 5 mg by mouth 2 (two) times daily.     diphenhydrAMINE (SOMINEX) 25 MG tablet Take 25 mg by mouth at bedtime as needed.     doxylamine, Sleep, (UNISOM) 25 MG tablet Take 25 mg by mouth at bedtime as needed.     empagliflozin (JARDIANCE) 10 MG TABS tablet Take 1 tablet (10 mg total) by mouth daily. 30 tablet 11   fluticasone (FLOVENT HFA) 110 MCG/ACT inhaler Inhale 2 inhalations into the lungs 2 (two) times daily 12 g 12   lidocaine-prilocaine (EMLA) cream Apply to affected area once 30 g 3   Multiple Vitamins-Minerals (MULTIVITAMIN WITH MINERALS) tablet Take 1 tablet by mouth daily.     prochlorperazine (COMPAZINE) 10 MG tablet Take 1 tablet (10 mg total) by mouth every 6 (six) hours as needed for nausea or vomiting. 40 tablet 1   rosuvastatin (CRESTOR) 10 MG tablet Take 1 tablet (10  mg total) by mouth every evening. 90 tablet 3   acetaminophen (TYLENOL) 500 MG tablet Take 1,000 mg by mouth every 6 (six) hours as needed for mild pain. (Patient not taking: Reported on 12/08/2021)     apixaban (ELIQUIS) 5 MG TABS tablet Take 1 tablet (5 mg total) by mouth 2 (two) times daily. (Patient not taking: Reported on 12/08/2021) 180 tablet 1   blood glucose meter kit and supplies KIT #Check blood glucose fasting/in the morning; after lunch; and after dinner. (Patient not taking: Reported on 12/08/2021) 1 each 0   Blood Pressure Monitoring (OMRON 3 SERIES BP MONITOR) DEVI Use as directed (Patient not taking: Reported on 12/08/2021) 1 each 0   cetirizine (ZYRTEC) 10 MG tablet Take 10 mg by mouth every morning. (Patient not taking: Reported on 12/08/2021)     fluticasone (FLOVENT HFA) 110 MCG/ACT inhaler Inhale into the lungs 2 (two) times daily. (Patient not taking: Reported on 12/08/2021) 12 g 12   glucose blood (FREESTYLE LITE) test strip Use as directed to test 3 times daily (Patient not taking: Reported on 12/08/2021) 100 each 0   Lancets (FREESTYLE) lancets #Check blood glucose fasting/in the morning; after lunch; and after dinner. (Patient not taking: Reported on 12/08/2021) 100 each 12   loperamide (  IMODIUM A-D) 2 MG tablet Take 1 tablet (2 mg total) by mouth 4 (four) times daily as needed for diarrhea or loose stools. (Patient not taking: Reported on 12/08/2021) 30 tablet 0   predniSONE (DELTASONE) 20 MG tablet Take 1 tablet (20 mg total) by mouth daily with breakfast. Once a day with food (Patient not taking: Reported on 10/30/2021) 7 tablet 0   No current facility-administered medications for this visit.    Allergies:   Patient has no known allergies.   Social History:  The patient  reports that he quit smoking about 17 months ago. His smoking use included cigarettes. He has a 40.00 pack-year smoking history. He has been exposed to tobacco smoke. He has never used smokeless tobacco. He  reports that he does not currently use alcohol. He reports that he does not use drugs.   Family History:   family history includes Atrial fibrillation in his mother; CAD in his mother; Diabetes in his father.   Review of Systems: Review of Systems  Constitutional: Negative.   HENT: Negative.    Respiratory: Negative.    Cardiovascular: Negative.   Gastrointestinal: Negative.   Musculoskeletal: Negative.   Neurological: Negative.   Psychiatric/Behavioral: Negative.    All other systems reviewed and are negative.  PHYSICAL EXAM: VS:  BP 120/64 (BP Location: Left Arm, Patient Position: Sitting, Cuff Size: Normal)   Pulse 72   Ht 5' 11" (1.803 m)   Wt 246 lb 6 oz (111.8 kg)   SpO2 98%   BMI 34.36 kg/m  , BMI Body mass index is 34.36 kg/m. Constitutional:  oriented to person, place, and time. No distress.  HENT:  Head: Grossly normal Eyes:  no discharge. No scleral icterus.  Neck: No JVD, no carotid bruits  Cardiovascular: Regular rate and rhythm, no murmurs appreciated Pulmonary/Chest: Clear to auscultation bilaterally, no wheezes or rails Abdominal: Soft.  no distension.  no tenderness.  Musculoskeletal: Normal range of motion Neurological:  normal muscle tone. Coordination normal. No atrophy Skin: Skin warm and dry Psychiatric: normal affect, pleasant  Recent Labs: 03/07/2021: B Natriuretic Peptide 133.1 07/03/2021: Magnesium 1.8 12/08/2021: ALT 37; BUN 16; Creatinine, Ser 1.31; Hemoglobin 12.1; Platelets 117; Potassium 3.4; Sodium 134   Lipid Panel Lab Results  Component Value Date   CHOL 174 07/07/2020   HDL 30 (L) 07/07/2020   LDLCALC 119 (H) 07/07/2020   TRIG 176 (H) 07/03/2021    Wt Readings from Last 3 Encounters:  12/08/21 246 lb 6 oz (111.8 kg)  12/08/21 246 lb 8 oz (111.8 kg)  10/30/21 247 lb (112 kg)    ASSESSMENT AND PLAN:  Problem List Items Addressed This Visit       Cardiology Problems   Paroxysmal atrial fibrillation ([place]) - Primary    Essential hypertension   Cardiomyopathy ([place])     Other   Diabetes mellitus type 2 with complications ([place])   Other Visit Diagnoses     Chronic systolic heart failure (HCC)       Hyperlipidemia, mixed       Stage 3a chronic kidney disease (HCC)          Paroxysmal atrial fibrillation Maintaining normal sinus rhythm Continue amiodarone Currently not on beta-blocker presumably secondary to hypotension in the setting of weight loss after GI surgery Recommend he hold Eliquis for now in the setting of frequent nosebleeds Discussed possible ablation versus Watchman device He will call us after he has had an opportunity to talk with ENT  Diabetes type  2 A1c 6.2, remains on Jardiance Weight trending higher  Smoker Reports that he quit smoking last year  Cardiomyopathy Normalized ejection fraction noted July 2022 On Jardiance Beta-blocker previously held for hypotension, Blood pressure stabilized now with higher weight following recovery from GI surgery Will hold off at this time on restarting cardiac medications, EF has normalized    Total encounter time more than 30 minutes  Greater than 50% was spent in counseling and coordination of care with the patient    Signed, Esmond Plants, M.D., Ph.D. [place], [place]

## 2021-12-08 NOTE — Patient Instructions (Addendum)
Medication Instructions:   Hold the eliquis  2. Monitor heart rate -- If rate elevated consistently - Take an extra amiodarone and a bisoprolol   If you need a refill on your cardiac medications before your next appointment, please call your pharmacy.   Lab work: No new labs needed  Testing/Procedures: No new testing needed  Follow-Up: At [facility], you and your health needs are our priority.  As part of our continuing mission to provide you with exceptional heart care, we have created designated Provider Care Teams.  These Care Teams include your primary Cardiologist (physician) and Advanced Practice Providers (APPs -  Physician Assistants and Nurse Practitioners) who all work together to provide you with the care you need, when you need it.  You will need a follow up appointment in 6 months  Providers on your designated Care Team:   Murray Hodgkins, NP Christell Faith, PA-C Cadence Kathlen Mody, [place]  COVID-19 Vaccine Information can be found at: [email or web address] For questions related to vaccine distribution or appointments, please email [email or web address] or call [phone].
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
## 2021-12-09 ENCOUNTER — Other Ambulatory Visit: Payer: Self-pay

## 2021-12-13 ENCOUNTER — Encounter: Payer: Self-pay | Admitting: Internal Medicine

## 2021-12-13 ENCOUNTER — Telehealth: Payer: Self-pay

## 2021-12-13 NOTE — Telephone Encounter (Signed)
Referral faxed to [facility] ENT for epistaxis.

## 2021-12-14 ENCOUNTER — Other Ambulatory Visit: Payer: Self-pay

## 2022-01-09 ENCOUNTER — Other Ambulatory Visit: Payer: Self-pay

## 2022-01-09 DIAGNOSIS — J3489 Other specified disorders of nose and nasal sinuses: Secondary | ICD-10-CM | POA: Diagnosis not present

## 2022-01-09 DIAGNOSIS — J302 Other seasonal allergic rhinitis: Secondary | ICD-10-CM | POA: Diagnosis not present

## 2022-01-09 DIAGNOSIS — R040 Epistaxis: Secondary | ICD-10-CM | POA: Diagnosis not present

## 2022-01-09 MED ORDER — GENTAMICIN SULFATE 0.1 % EX OINT
TOPICAL_OINTMENT | CUTANEOUS | 0 refills | Status: DC
  Filled 2022-01-09: qty 30, 30d supply, fill #0

## 2022-01-09 MED ORDER — MONTELUKAST SODIUM 10 MG PO TABS
ORAL_TABLET | ORAL | 11 refills | Status: DC
  Filled 2022-01-09: qty 30, 30d supply, fill #0
  Filled 2022-02-21: qty 30, 30d supply, fill #1
  Filled 2022-04-27: qty 30, 30d supply, fill #2
  Filled 2022-06-19 (×2): qty 30, 30d supply, fill #3
  Filled 2022-08-06: qty 30, 30d supply, fill #4
  Filled 2022-10-22: qty 30, 30d supply, fill #5

## 2022-01-11 ENCOUNTER — Other Ambulatory Visit: Payer: Self-pay

## 2022-01-11 ENCOUNTER — Ambulatory Visit: Payer: 59 | Admitting: Gastroenterology

## 2022-01-11 ENCOUNTER — Encounter: Payer: Self-pay | Admitting: Gastroenterology

## 2022-01-11 VITALS — BP 122/69 | HR 69 | Temp 98.7°F | Wt 252.4 lb

## 2022-01-11 DIAGNOSIS — R768 Other specified abnormal immunological findings in serum: Secondary | ICD-10-CM | POA: Diagnosis not present

## 2022-01-11 DIAGNOSIS — Z85048 Personal history of other malignant neoplasm of rectum, rectosigmoid junction, and anus: Secondary | ICD-10-CM | POA: Diagnosis not present

## 2022-01-11 NOTE — Progress Notes (Signed)
Tracy Bellows MD, MRCP(U.K) [address]  [place]  [place], [place] [address]  Main: [phone]  Fax: [phone]   Primary Care Physician: [name], PA-C  Primary Gastroenterologist:  Dr. Jonathon Gardner   Chief Complaint  Patient presents with   History of H Pylori    HPI: Tracy Gardner is a 56 y.o. male  Summary of history :   Has been  seen by Dr. Bonna Gains when he was admitted in December 2022 for anemia and GI bleed.  He had been on Eliquis..On 03/09/2021 underwent an EGD showed inflammation of the gastric antrum that was biopsied.  Underwent a colonoscopy at the same time there was a ulcerated partially obstructing mass in the sigmoid colon.  The scope was not able to pass 40 cm from the lesion.  Poor prep.  Biopsies were taken.  Biopsies of the mass showed invasive moderately differentiated adenocarcinoma.  H. pylori antibody was positive treated with 14 days of triple therapy.   Seen Dr. Rogue Bussing subsequently underwent resection of the sigmoid mass.  In January 2023 postsurgery small bowel obstruction treated conservatively.  Metastatic involving 2 of 20 lymph nodes.  On chemotherapy.  05/09/2021 hemoglobin 12.1 g with MCV of 79.     He  is here to get tested for h pylori after his chemotherapy as he underwent incomplete therapy previously .  He states he has completed his chemotherapy and is due for CAT scans and PET scans tomorrow.  No other complaints presently.   Current Outpatient Medications  Medication Sig Dispense Refill   acetaminophen (TYLENOL) 500 MG tablet Take 1,000 mg by mouth every 6 (six) hours as needed for mild pain.     albuterol (VENTOLIN HFA) 108 (90 Base) MCG/ACT inhaler Inhale 2 puffs into the lungs 4 times a day as needed for shortness of breath, wheezing and cough 6.7 g 11   amiodarone (PACERONE) 200 MG tablet Take 1 tablet (200 mg total) by mouth daily. Hold for HR less then 50 (Patient taking differently: Take 200 mg by mouth  at bedtime. Hold for HR less then 50) 90 tablet 3   atorvastatin (LIPITOR) 10 MG tablet Take 10 mg by mouth daily.     bisoprolol (ZEBETA) 5 MG tablet Take 0.5 tablets (2.5 mg total) by mouth as needed. For fast heart rates     blood glucose meter kit and supplies KIT #Check blood glucose fasting/in the morning; after lunch; and after dinner. 1 each 0   Blood Pressure Monitoring (OMRON 3 SERIES BP MONITOR) DEVI Use as directed 1 each 0   cetirizine (ZYRTEC) 10 MG tablet Take 10 mg by mouth every morning.     diphenhydrAMINE (SOMINEX) 25 MG tablet Take 25 mg by mouth at bedtime as needed.     doxylamine, Sleep, (UNISOM) 25 MG tablet Take 25 mg by mouth at bedtime as needed.     empagliflozin (JARDIANCE) 10 MG TABS tablet Take 1 tablet (10 mg total) by mouth daily. 30 tablet 11   fluticasone (FLOVENT HFA) 110 MCG/ACT inhaler Inhale into the lungs 2 (two) times daily. 12 g 12   fluticasone (FLOVENT HFA) 110 MCG/ACT inhaler Inhale 2 inhalations into the lungs 2 (two) times daily 12 g 12   gentamicin ointment (GARAMYCIN) 0.1 % Place ointment on inside of nostril TID x14 days 30 g 0   glucose blood (FREESTYLE LITE) test strip Use as directed to test 3 times daily 100 each 0   Lancets (  FREESTYLE) lancets #Check blood glucose fasting/in the morning; after lunch; and after dinner. 100 each 12   lidocaine-prilocaine (EMLA) cream Apply to affected area once 30 g 3   loperamide (IMODIUM A-D) 2 MG tablet Take 1 tablet (2 mg total) by mouth 4 (four) times daily as needed for diarrhea or loose stools. 30 tablet 0   montelukast (SINGULAIR) 10 MG tablet Take one tablet by mouth daily at bedtime 30 tablet 11   Multiple Vitamins-Minerals (MULTIVITAMIN WITH MINERALS) tablet Take 1 tablet by mouth daily.     predniSONE (DELTASONE) 20 MG tablet Take 1 tablet (20 mg total) by mouth daily with breakfast. Once a day with food 7 tablet 0   prochlorperazine (COMPAZINE) 10 MG tablet Take 1 tablet (10 mg total) by mouth every  6 (six) hours as needed for nausea or vomiting. 40 tablet 1   No current facility-administered medications for this visit.    Allergies as of 01/11/2022   (No Known Allergies)    ROS:  General: Negative for anorexia, weight loss, fever, chills, fatigue, weakness. ENT: Negative for hoarseness, difficulty swallowing , nasal congestion. CV: Negative for chest pain, angina, palpitations, dyspnea on exertion, peripheral edema.  Respiratory: Negative for dyspnea at rest, dyspnea on exertion, cough, sputum, wheezing.  GI: See history of present illness. GU:  Negative for dysuria, hematuria, urinary incontinence, urinary frequency, nocturnal urination.  Endo: Negative for unusual weight change.    Physical Examination:   BP 122/69   Pulse 69   Temp 98.7 F (37.1 C) (Oral)   Wt 252 lb 6.4 oz (114.5 kg)   BMI 35.20 kg/m   General: Well-nourished, well-developed in no acute distress.  Eyes: No icterus. Conjunctivae pink. Neuro: Alert and oriented x 3.  Grossly intact. Skin: Warm and dry, no jaundice.   Psych: Alert and cooperative, normal mood and affect.   Imaging Studies: No results found.  Assessment and Plan:   Tracy Gardner is a 56 y.o. y/o male  admitted in December 2023 with melena.  Endoscopy and colonoscopy demonstrated sigmoid cancer , underwent surgery and has a colostomy at this point of time  and is undergoing chemotherapy.  Gastritis was noted on endoscopy and hence H. pylori serology was obtained that was positive and was advised to get treatment.  He never did complete the antibiotic treatment as he was  on chemotherapy.  Today he is back for evaluation   Plan  H pylori breath test and treat if positive I will discuss with Dr. Rogue Bussing timing of his surveillance colonoscopy.    Dr Tracy Bellows  MD,MRCP [facility]) Follow up in as needed

## 2022-01-12 ENCOUNTER — Ambulatory Visit
Admission: RE | Admit: 2022-01-12 | Discharge: 2022-01-12 | Disposition: A | Discharge status: Home / Self Care | Payer: 59 | Source: Ambulatory Visit | Attending: Nurse Practitioner | Admitting: Nurse Practitioner

## 2022-01-12 DIAGNOSIS — C187 Malignant neoplasm of sigmoid colon: Secondary | ICD-10-CM | POA: Insufficient documentation

## 2022-01-12 DIAGNOSIS — C189 Malignant neoplasm of colon, unspecified: Secondary | ICD-10-CM | POA: Diagnosis not present

## 2022-01-12 DIAGNOSIS — K6389 Other specified diseases of intestine: Secondary | ICD-10-CM | POA: Diagnosis not present

## 2022-01-12 DIAGNOSIS — R918 Other nonspecific abnormal finding of lung field: Secondary | ICD-10-CM | POA: Diagnosis not present

## 2022-01-12 DIAGNOSIS — Z905 Acquired absence of kidney: Secondary | ICD-10-CM | POA: Diagnosis not present

## 2022-01-12 DIAGNOSIS — I700 Atherosclerosis of aorta: Secondary | ICD-10-CM | POA: Diagnosis not present

## 2022-01-12 LAB — POCT I-STAT CREATININE: Creatinine, Ser: 1.4 mg/dL — ABNORMAL HIGH (ref 0.61–1.24)

## 2022-01-12 LAB — H. PYLORI BREATH TEST: H pylori Breath Test: NEGATIVE

## 2022-01-12 MED ORDER — IOHEXOL 300 MG/ML  SOLN
100.0000 mL | Freq: Once | INTRAMUSCULAR | Status: AC | PRN
  Administered 2022-01-12: 100 mL via INTRAVENOUS

## 2022-01-15 ENCOUNTER — Other Ambulatory Visit: Payer: 59

## 2022-01-15 ENCOUNTER — Ambulatory Visit: Payer: 59 | Admitting: Internal Medicine

## 2022-01-19 ENCOUNTER — Ambulatory Visit: Payer: 59 | Admitting: Internal Medicine

## 2022-01-19 ENCOUNTER — Other Ambulatory Visit: Payer: 59

## 2022-01-26 ENCOUNTER — Other Ambulatory Visit: Payer: Self-pay

## 2022-01-31 DIAGNOSIS — J3489 Other specified disorders of nose and nasal sinuses: Secondary | ICD-10-CM | POA: Diagnosis not present

## 2022-01-31 DIAGNOSIS — R040 Epistaxis: Secondary | ICD-10-CM | POA: Diagnosis not present

## 2022-01-31 DIAGNOSIS — J301 Allergic rhinitis due to pollen: Secondary | ICD-10-CM | POA: Diagnosis not present

## 2022-02-07 ENCOUNTER — Other Ambulatory Visit: Payer: 59

## 2022-02-09 ENCOUNTER — Ambulatory Visit: Payer: 59 | Admitting: Internal Medicine

## 2022-02-09 ENCOUNTER — Other Ambulatory Visit: Payer: 59

## 2022-02-13 ENCOUNTER — Encounter: Payer: Self-pay | Admitting: *Deleted

## 2022-02-13 ENCOUNTER — Inpatient Hospital Stay: Payer: 59 | Attending: Internal Medicine

## 2022-02-13 ENCOUNTER — Encounter: Payer: Self-pay | Admitting: Internal Medicine

## 2022-02-13 ENCOUNTER — Inpatient Hospital Stay (HOSPITAL_BASED_OUTPATIENT_CLINIC_OR_DEPARTMENT_OTHER): Payer: 59 | Admitting: Internal Medicine

## 2022-02-13 VITALS — BP 128/71 | HR 69 | Temp 99.1°F | Resp 16 | Wt 251.6 lb

## 2022-02-13 DIAGNOSIS — Z87442 Personal history of urinary calculi: Secondary | ICD-10-CM | POA: Diagnosis not present

## 2022-02-13 DIAGNOSIS — R5383 Other fatigue: Secondary | ICD-10-CM | POA: Insufficient documentation

## 2022-02-13 DIAGNOSIS — R200 Anesthesia of skin: Secondary | ICD-10-CM | POA: Diagnosis not present

## 2022-02-13 DIAGNOSIS — E119 Type 2 diabetes mellitus without complications: Secondary | ICD-10-CM | POA: Diagnosis not present

## 2022-02-13 DIAGNOSIS — Z9221 Personal history of antineoplastic chemotherapy: Secondary | ICD-10-CM | POA: Diagnosis not present

## 2022-02-13 DIAGNOSIS — Z833 Family history of diabetes mellitus: Secondary | ICD-10-CM | POA: Diagnosis not present

## 2022-02-13 DIAGNOSIS — I129 Hypertensive chronic kidney disease with stage 1 through stage 4 chronic kidney disease, or unspecified chronic kidney disease: Secondary | ICD-10-CM | POA: Diagnosis not present

## 2022-02-13 DIAGNOSIS — I429 Cardiomyopathy, unspecified: Secondary | ICD-10-CM | POA: Diagnosis not present

## 2022-02-13 DIAGNOSIS — Z8249 Family history of ischemic heart disease and other diseases of the circulatory system: Secondary | ICD-10-CM | POA: Insufficient documentation

## 2022-02-13 DIAGNOSIS — D696 Thrombocytopenia, unspecified: Secondary | ICD-10-CM | POA: Diagnosis not present

## 2022-02-13 DIAGNOSIS — C187 Malignant neoplasm of sigmoid colon: Secondary | ICD-10-CM | POA: Diagnosis not present

## 2022-02-13 DIAGNOSIS — Z79899 Other long term (current) drug therapy: Secondary | ICD-10-CM | POA: Diagnosis not present

## 2022-02-13 DIAGNOSIS — Z7901 Long term (current) use of anticoagulants: Secondary | ICD-10-CM | POA: Insufficient documentation

## 2022-02-13 DIAGNOSIS — R040 Epistaxis: Secondary | ICD-10-CM | POA: Diagnosis not present

## 2022-02-13 DIAGNOSIS — Z905 Acquired absence of kidney: Secondary | ICD-10-CM | POA: Diagnosis not present

## 2022-02-13 DIAGNOSIS — Z95828 Presence of other vascular implants and grafts: Secondary | ICD-10-CM | POA: Diagnosis not present

## 2022-02-13 DIAGNOSIS — I4819 Other persistent atrial fibrillation: Secondary | ICD-10-CM | POA: Diagnosis not present

## 2022-02-13 DIAGNOSIS — D751 Secondary polycythemia: Secondary | ICD-10-CM | POA: Insufficient documentation

## 2022-02-13 DIAGNOSIS — R202 Paresthesia of skin: Secondary | ICD-10-CM | POA: Diagnosis not present

## 2022-02-13 LAB — COMPREHENSIVE METABOLIC PANEL
ALT: 28 U/L (ref 0–44)
AST: 27 U/L (ref 15–41)
Albumin: 3.9 g/dL (ref 3.5–5.0)
Alkaline Phosphatase: 73 U/L (ref 38–126)
Anion gap: 10 (ref 5–15)
BUN: 24 mg/dL — ABNORMAL HIGH (ref 6–20)
CO2: 21 mmol/L — ABNORMAL LOW (ref 22–32)
Calcium: 8.4 mg/dL — ABNORMAL LOW (ref 8.9–10.3)
Chloride: 104 mmol/L (ref 98–111)
Creatinine, Ser: 1.45 mg/dL — ABNORMAL HIGH (ref 0.61–1.24)
GFR, Estimated: 57 mL/min — ABNORMAL LOW (ref >60)
Glucose, Bld: 242 mg/dL — ABNORMAL HIGH (ref 70–99)
Potassium: 3.6 mmol/L (ref 3.5–5.1)
Sodium: 135 mmol/L (ref 135–145)
Total Bilirubin: 0.4 mg/dL (ref 0.3–1.2)
Total Protein: 7.4 g/dL (ref 6.5–8.1)

## 2022-02-13 LAB — CBC WITH DIFFERENTIAL/PLATELET
Abs Immature Granulocytes: 0.01 10*3/uL (ref 0.00–0.07)
Basophils Absolute: 0.1 10*3/uL (ref 0.0–0.1)
Basophils Relative: 2 %
Eosinophils Absolute: 0.2 10*3/uL (ref 0.0–0.5)
Eosinophils Relative: 4 %
HCT: 43.5 % (ref 39.0–52.0)
Hemoglobin: 13.5 g/dL (ref 13.0–17.0)
Immature Granulocytes: 0 %
Lymphocytes Relative: 17 %
Lymphs Abs: 0.7 10*3/uL (ref 0.7–4.0)
MCH: 24.4 pg — ABNORMAL LOW (ref 26.0–34.0)
MCHC: 31 g/dL (ref 30.0–36.0)
MCV: 78.7 fL — ABNORMAL LOW (ref 80.0–100.0)
Monocytes Absolute: 0.4 10*3/uL (ref 0.1–1.0)
Monocytes Relative: 9 %
Neutro Abs: 2.7 10*3/uL (ref 1.7–7.7)
Neutrophils Relative %: 68 %
Platelets: 133 10*3/uL — ABNORMAL LOW (ref 150–400)
RBC: 5.53 MIL/uL (ref 4.22–5.81)
RDW: 15.4 % (ref 11.5–15.5)
WBC: 4 10*3/uL (ref 4.0–10.5)
nRBC: 0 % (ref 0.0–0.2)

## 2022-02-13 MED ORDER — HEPARIN SOD (PORK) LOCK FLUSH 100 UNIT/ML IV SOLN
500.0000 [IU] | Freq: Once | INTRAVENOUS | Status: AC
  Filled 2022-02-13: qty 5

## 2022-02-13 MED ORDER — HEPARIN SOD (PORK) LOCK FLUSH 100 UNIT/ML IV SOLN
INTRAVENOUS | Status: AC
  Administered 2022-02-13: 500 [IU] via INTRAVENOUS
  Filled 2022-02-13: qty 5

## 2022-02-13 MED ORDER — SODIUM CHLORIDE 0.9% FLUSH
10.0000 mL | Freq: Once | INTRAVENOUS | Status: AC
  Administered 2022-02-13: 10 mL via INTRAVENOUS
  Filled 2022-02-13: qty 10

## 2022-02-13 NOTE — Progress Notes (Signed)
Patient was evaluated by ENT for epistaxis that caused a decrease in hemoglobin.  The bleed was cauterized and advised to stop Eliquis.

## 2022-02-13 NOTE — Assessment & Plan Note (Addendum)
#   Stage III colon cancer T3N2-rectosigmoid; distal 5 mm margin; MSS. currently s/p adjuvant chemo FOLFOX x 6 months [finished November 2023]; NOV 3rd, 2023-CT chest and pelvis- No evidence of recurrent or metastatic carcinoma.  Discussed regarding surveillance/colonoscopy within 1 year.  Will refer to [place].    # Epistaxis recurrent s/p cautery [currently off Eliquis]-platelets 133.  Stable.  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   # Solitary kidney- GFR- creat- 1.26 [GFR- 57; hx of acute renal failure [JAN 2023-creatinine-5.3]; monitor closely. STABLE.     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   # Obesity/diabetes-  discussed importance of healthy weight/and weight loss.  Strongly recommend eating more green leafy vegetables and cutting down processed food/ carbohydrates.  Instead increasing whole grains / protein in the diet.  Multiple studies have shown that optimal weight would help improve cardiovascular risk; also shown to cut on the risk of malignancies-colon cancer and also prostate cancer in men.  Also recommend talking to PCP regarding weight loss medication/adjusting diabetes medication etc.  #  IV access:port- Functional; will refer to surgery regarding port removal.   # Incidental findings on Imaging  CT CAP 2023: Bilateral pulmonary inflammatory changes improved/resolved compared to imaging in December 2022.  I reviewed/discussed/counseled the patient.   # DISPOSITION:  # refer to [facility]- GI re: colonoscopy s/p colon cancer chemo # referral to Dr.Pabone re: port removal  # Follow up in 4 months- labs- cbc/cmp; CEA-  Dr.B  # I reviewed the blood work- with the patient in detail; also reviewed the imaging independently [as summarized above]; and with the patient in detail.

## 2022-02-13 NOTE — Patient Instructions (Signed)
Referral faxed to [facility] GI RE: patient needs colonoscopy; post chemo for colon cancer
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
## 2022-02-14 LAB — CEA: CEA: 1 ng/mL (ref 0.0–4.7)

## 2022-02-21 ENCOUNTER — Other Ambulatory Visit: Payer: Self-pay

## 2022-02-21 ENCOUNTER — Ambulatory Visit: Payer: 59 | Admitting: Surgery

## 2022-02-21 ENCOUNTER — Encounter: Payer: Self-pay | Admitting: Surgery

## 2022-02-21 VITALS — BP 129/63 | HR 74 | Temp 97.8°F | Ht 71.0 in | Wt 254.0 lb

## 2022-02-21 DIAGNOSIS — Z452 Encounter for adjustment and management of vascular access device: Secondary | ICD-10-CM | POA: Diagnosis not present

## 2022-02-21 DIAGNOSIS — C189 Malignant neoplasm of colon, unspecified: Secondary | ICD-10-CM

## 2022-02-21 NOTE — Patient Instructions (Addendum)
Port-a-Cath [facility]) A central line is a soft, flexible tube (catheter) that can be used to collect blood for testing or to give medicine or nutrition through a vein. The tip of the central line ends in a large vein just above the heart called the vena cava. A central line may be placed because: You need to get medicines or fluids through an IV tube for a long period of time. You need nutrition but cannot eat or absorb nutrients. The veins in your hands or arms are hard to access. You need to have blood taken often for blood tests. You need a blood transfusion You need chemotherapy or dialysis.  There are many types of central lines: Peripherally inserted central catheter (PICC) line. This type is used for intermediate access to long-term access of one week or more. It can be used to draw blood and give fluids or medicines. A PICC looks like an IV tube, but it goes up the arm to the heart. It is usually inserted in the upper arm and taped in place on the arm. Tunneled central line. This type is used for long-term therapy and dialysis. It is placed in a large vein in the neck, chest, or groin. A tunneled central line is inserted through a small incision made over the vein and is advanced into the heart. It is tunneled beneath the skin and brought out through a second incision. Non-tunneled central line. This type is used for short-term access, usually of a maximum of 7 days. It is often used in the emergency department. A non-tunneled central line is inserted in the neck, chest, or groin. Implanted port. This type is used for long-term therapy. It can stay in place longer than other types of central lines. An implanted port is normally inserted in the upper chest but can also be placed in the upper arm or in the abdomen. It is inserted and removed with surgery, and it is accessed using a special needle.  The type of central line that you receive depends on how long you will need it, your  medical condition, and the condition of your veins. What are the risks? Using any type of central line has risks that you should be aware of, including: Infection. A blood clot that blocks the central line or forms in the vein and travels to the heart. Bleeding from the place where the central line was put in. Developing a hole or crack within the central line. If this happens, the central line will need to be replaced. Developing an abnormal heart rhythm (arrhythmia). This is rare. Central line failure.  Follow these instructions at home: Flushing and cleaning the central line Follow instructions from the health care provider about flushing and cleaning the central line. Wear a mask when flushing or cleaning the central line. Before you flush or clean the central line: Wash your hands with soap and water. Clean the central line hub with rubbing alcohol. Insertion site care Keep the insertion site of your central line clean and dry at all times. Check your incision or central line site every day for signs of infection. Check for: More redness, swelling, or pain. More fluid or blood. Warmth. Pus or a bad smell. General instructions Follow instructions from your health care provider for the type of device that you have. If the central line accidentally gets pulled on, make sure: The bandage (dressing) is okay. There is no bleeding. The line has not been pulled out. Return to  your normal activities as told by your health care provider. Ask your health care provider what activities are safe for you. You may be restricted from lifting or making repetitive arm movements on the side with the catheter. Do not swim or bathe unless your health care provider approves. Keep your dressing dry. Your health care provider can instruct you about how to keep your specific type of dressing from getting wet. Keep all follow-up visits as told by your health care provider. This is important. Contact a  health care provider if: You have more redness, swelling, or pain around your incision. You have more fluid or blood coming from your incision. Your incision feels warm to the touch. You have pus or a bad smell coming from your incision. Get help right away if: You have: Chills. A fever. Shortness of breath. Trouble breathing. Chest pain. Swelling in your neck, face, chest, or arm on the side of your central line. You are coughing. You feel your heart beating rapidly or skipping beats. You feel dizzy or you faint. Your incision or central line site has red streaks spreading away from the area. Your incision or central line site is bleeding and does not stop. Your central line is difficult to flush or will not flush. You do not get a blood return from the central line. Your central line gets loose or comes out. Your central line gets damaged. Your catheter leaks when flushed or when fluids are infused into it. This information is not intended to replace advice given to you by your health care provider. Make sure you discuss any questions you have with your health care provider. Document Released: 04/19/2005 Document Revised: 10/26/2015 Document Reviewed: 10/05/2015 Elsevier Interactive Patient Education  2017 [employer].

## 2022-02-22 ENCOUNTER — Encounter: Payer: Self-pay | Admitting: Internal Medicine

## 2022-02-24 ENCOUNTER — Encounter: Payer: Self-pay | Admitting: Internal Medicine

## 2022-02-25 NOTE — Progress Notes (Signed)
PROCEDURE NOTE 1. Excision of Right IJ port  ANESTHESIA: Lidocaine 1% w epi 9cc  EBL: minimal  Complications: none  After informed consent was obtained.  We placed the patient in the supine position and prepped and draped in the usual sterile fashion.  An infraclavicular incision was created where the previous port was placed and we excised the subcutaneous tissue.  We identified the port on incises capsule.  We remove it from adjacent structures and cut the Prolene sutures.  We remove the port after asking the patient for a Valsalva and obtain hemostasis with pressure.  The wound was closed in a 2 layer fashion with 3-0 Vicryl and 4 Monocryl for the skin.  Dermabond was used to coat the skin.  There were no complications  

## 2022-02-26 DIAGNOSIS — G4733 Obstructive sleep apnea (adult) (pediatric): Secondary | ICD-10-CM | POA: Diagnosis not present

## 2022-03-01 ENCOUNTER — Encounter: Payer: 59 | Admitting: Physician Assistant

## 2022-04-27 ENCOUNTER — Other Ambulatory Visit: Payer: Self-pay | Admitting: Cardiovascular Disease

## 2022-04-27 ENCOUNTER — Other Ambulatory Visit: Payer: Self-pay

## 2022-04-27 MED ORDER — ROSUVASTATIN CALCIUM 10 MG PO TABS
10.0000 mg | ORAL_TABLET | Freq: Every evening | ORAL | 3 refills | Status: DC
  Filled 2022-04-27: qty 90, 90d supply, fill #0
  Filled 2022-08-06: qty 90, 90d supply, fill #1

## 2022-05-11 ENCOUNTER — Encounter: Payer: Self-pay | Admitting: Internal Medicine

## 2022-05-11 ENCOUNTER — Other Ambulatory Visit: Payer: Self-pay

## 2022-05-31 DIAGNOSIS — H5213 Myopia, bilateral: Secondary | ICD-10-CM | POA: Diagnosis not present

## 2022-05-31 DIAGNOSIS — H25032 Anterior subcapsular polar age-related cataract, left eye: Secondary | ICD-10-CM | POA: Diagnosis not present

## 2022-05-31 DIAGNOSIS — E113293 Type 2 diabetes mellitus with mild nonproliferative diabetic retinopathy without macular edema, bilateral: Secondary | ICD-10-CM | POA: Diagnosis not present

## 2022-06-06 ENCOUNTER — Ambulatory Visit: Payer: Commercial Managed Care - PPO | Admitting: Cardiovascular Disease

## 2022-06-15 ENCOUNTER — Inpatient Hospital Stay: Payer: Commercial Managed Care - PPO | Admitting: Internal Medicine

## 2022-06-15 ENCOUNTER — Inpatient Hospital Stay: Payer: Commercial Managed Care - PPO

## 2022-06-19 ENCOUNTER — Encounter: Payer: Self-pay | Admitting: Internal Medicine

## 2022-06-19 ENCOUNTER — Other Ambulatory Visit: Payer: Self-pay

## 2022-06-26 ENCOUNTER — Other Ambulatory Visit: Payer: Commercial Managed Care - PPO

## 2022-06-26 ENCOUNTER — Ambulatory Visit: Payer: Commercial Managed Care - PPO | Admitting: Internal Medicine

## 2022-07-08 IMAGING — CR DG CHEST 2V
2 series · 2 of 2 positions shown · non-contrast
Comparison: 07/07/2020

CLINICAL DATA: 55-year-old male with a history anemia

EXAM:
CHEST - 2 VIEW

[chest pa]
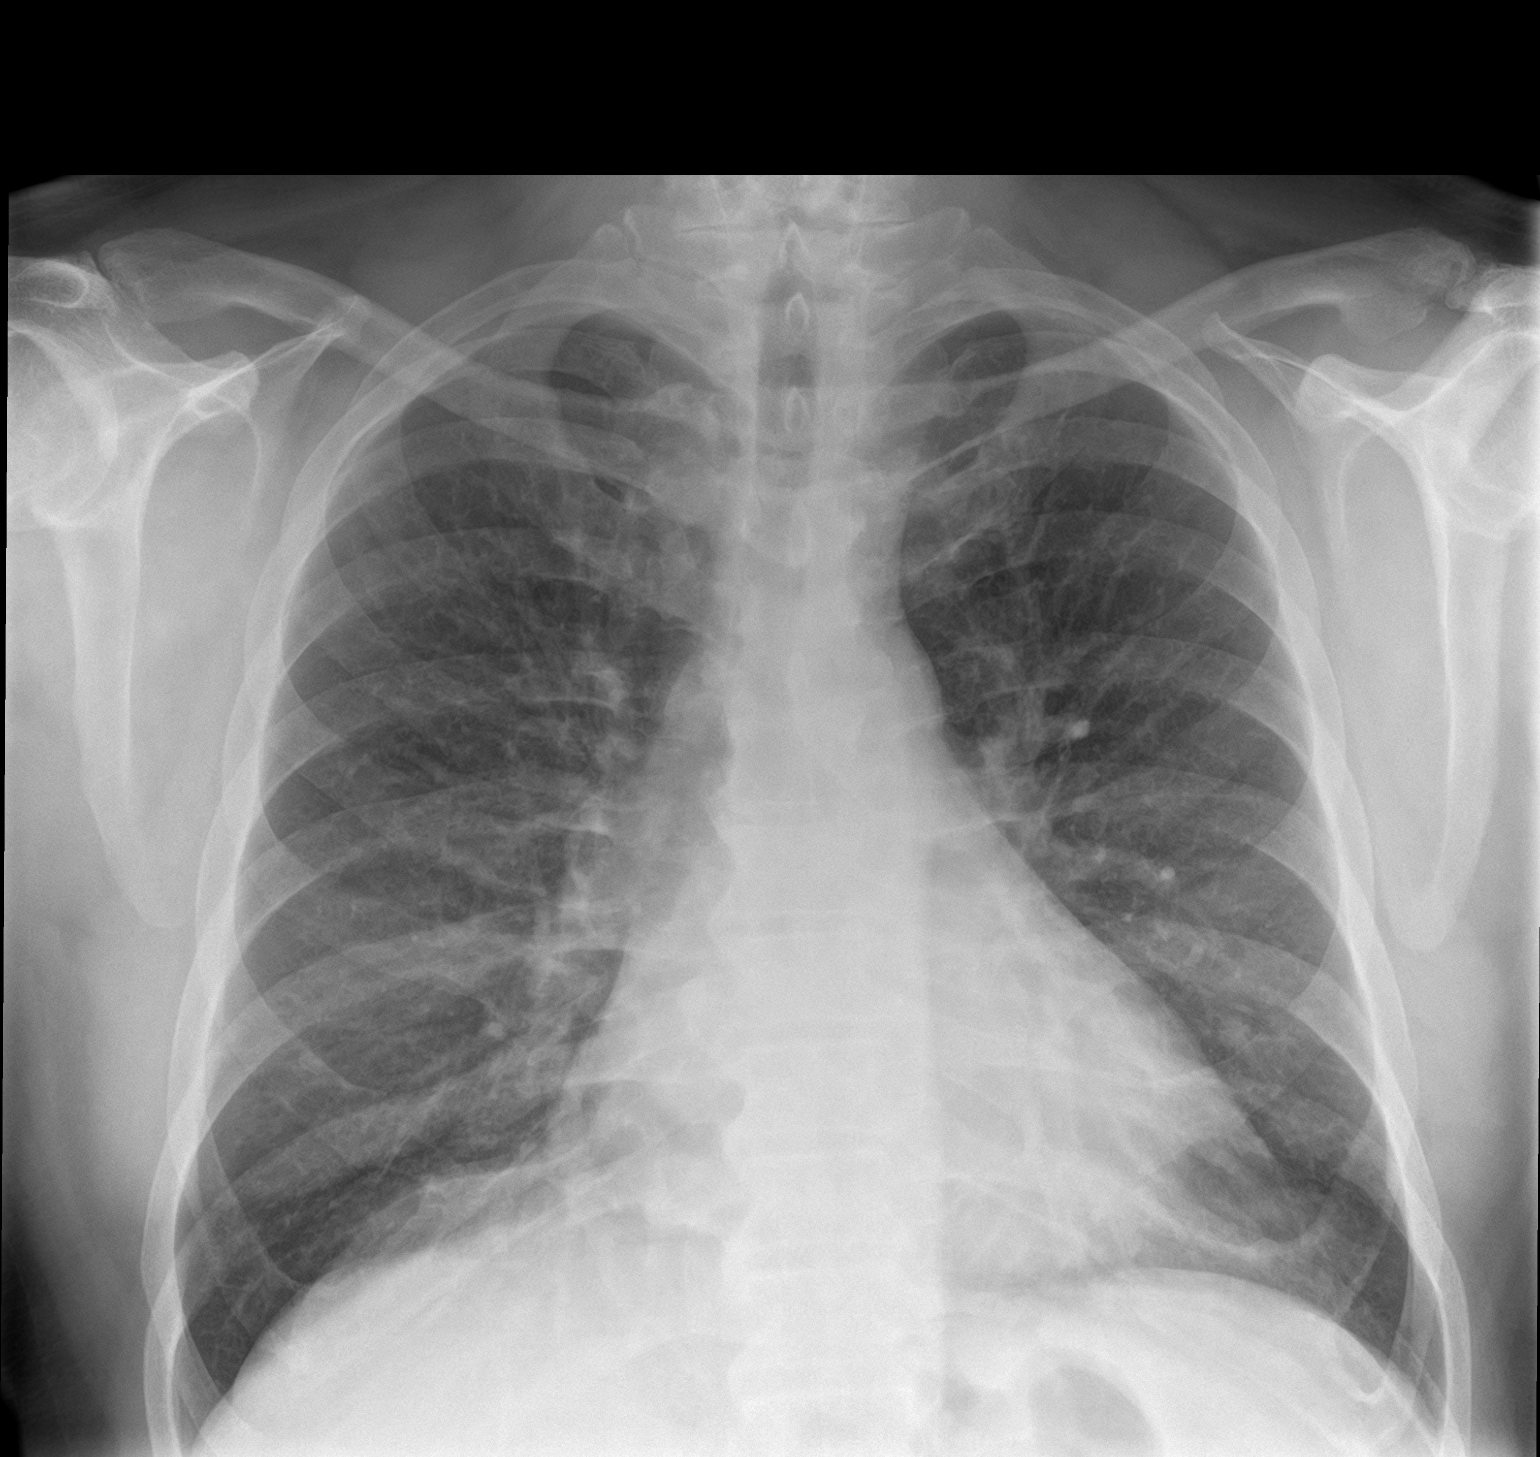

[chest lat]
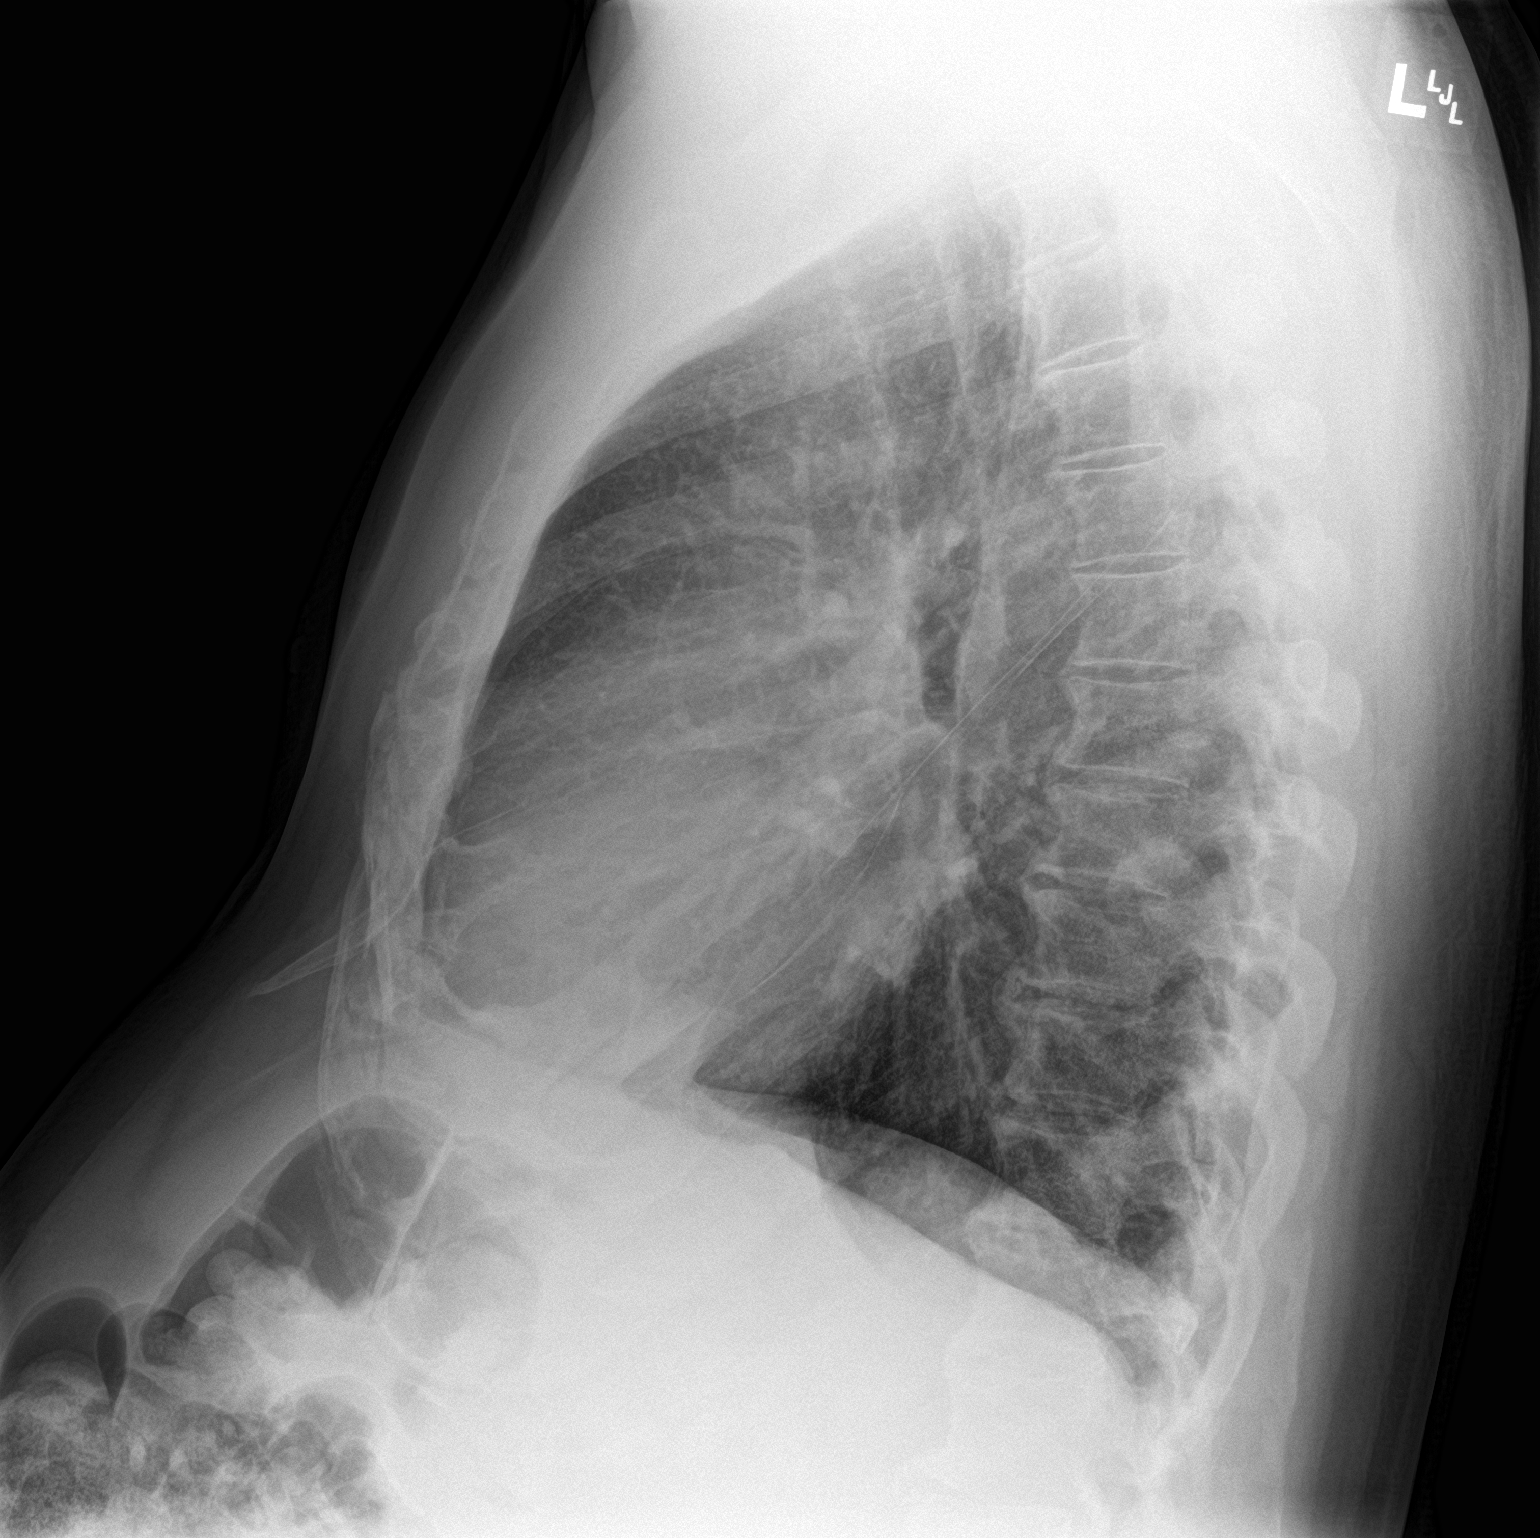

[2 of 2 positions shown; findings below may reference images not displayed]

FINDINGS: Cardiomediastinal silhouette unchanged in size and contour. No
evidence of central vascular congestion. No interlobular septal
thickening.

Interval removal of the defibrillator pads from the prior.

Overall improved aeration compared to the prior.

No pneumothorax or pleural effusion. Coarsened interstitial
markings, with no confluent airspace disease.

No acute displaced fracture. Degenerative changes of the spine.
IMPRESSION: Favored chronic changes without definite evidence of acute
cardiopulmonary disease.

## 2022-07-09 ENCOUNTER — Encounter: Payer: Self-pay | Admitting: *Deleted

## 2022-07-09 ENCOUNTER — Inpatient Hospital Stay (HOSPITAL_BASED_OUTPATIENT_CLINIC_OR_DEPARTMENT_OTHER): Payer: Commercial Managed Care - PPO | Admitting: Internal Medicine

## 2022-07-09 ENCOUNTER — Inpatient Hospital Stay: Payer: Commercial Managed Care - PPO | Attending: Internal Medicine

## 2022-07-09 ENCOUNTER — Encounter: Payer: Self-pay | Admitting: Internal Medicine

## 2022-07-09 VITALS — BP 125/71 | HR 54 | Temp 98.1°F | Ht 71.0 in | Wt 258.4 lb

## 2022-07-09 DIAGNOSIS — R5383 Other fatigue: Secondary | ICD-10-CM | POA: Insufficient documentation

## 2022-07-09 DIAGNOSIS — Z8249 Family history of ischemic heart disease and other diseases of the circulatory system: Secondary | ICD-10-CM | POA: Insufficient documentation

## 2022-07-09 DIAGNOSIS — R200 Anesthesia of skin: Secondary | ICD-10-CM | POA: Insufficient documentation

## 2022-07-09 DIAGNOSIS — C187 Malignant neoplasm of sigmoid colon: Secondary | ICD-10-CM

## 2022-07-09 DIAGNOSIS — R202 Paresthesia of skin: Secondary | ICD-10-CM | POA: Diagnosis not present

## 2022-07-09 DIAGNOSIS — C19 Malignant neoplasm of rectosigmoid junction: Secondary | ICD-10-CM | POA: Insufficient documentation

## 2022-07-09 DIAGNOSIS — I429 Cardiomyopathy, unspecified: Secondary | ICD-10-CM | POA: Insufficient documentation

## 2022-07-09 DIAGNOSIS — Z79899 Other long term (current) drug therapy: Secondary | ICD-10-CM | POA: Insufficient documentation

## 2022-07-09 DIAGNOSIS — R040 Epistaxis: Secondary | ICD-10-CM | POA: Diagnosis not present

## 2022-07-09 DIAGNOSIS — Z87442 Personal history of urinary calculi: Secondary | ICD-10-CM | POA: Diagnosis not present

## 2022-07-09 DIAGNOSIS — Z833 Family history of diabetes mellitus: Secondary | ICD-10-CM | POA: Insufficient documentation

## 2022-07-09 DIAGNOSIS — D696 Thrombocytopenia, unspecified: Secondary | ICD-10-CM | POA: Insufficient documentation

## 2022-07-09 DIAGNOSIS — Z9221 Personal history of antineoplastic chemotherapy: Secondary | ICD-10-CM | POA: Diagnosis not present

## 2022-07-09 DIAGNOSIS — Z87891 Personal history of nicotine dependence: Secondary | ICD-10-CM | POA: Diagnosis not present

## 2022-07-09 DIAGNOSIS — Z905 Acquired absence of kidney: Secondary | ICD-10-CM | POA: Insufficient documentation

## 2022-07-09 LAB — CBC WITH DIFFERENTIAL/PLATELET
Abs Immature Granulocytes: 0.02 10*3/uL (ref 0.00–0.07)
Basophils Absolute: 0.1 10*3/uL (ref 0.0–0.1)
Basophils Relative: 2 %
Eosinophils Absolute: 0.2 10*3/uL (ref 0.0–0.5)
Eosinophils Relative: 5 %
HCT: 45.3 % (ref 39.0–52.0)
Hemoglobin: 15.1 g/dL (ref 13.0–17.0)
Immature Granulocytes: 1 %
Lymphocytes Relative: 20 %
Lymphs Abs: 0.8 10*3/uL (ref 0.7–4.0)
MCH: 27.5 pg (ref 26.0–34.0)
MCHC: 33.3 g/dL (ref 30.0–36.0)
MCV: 82.5 fL (ref 80.0–100.0)
Monocytes Absolute: 0.5 10*3/uL (ref 0.1–1.0)
Monocytes Relative: 12 %
Neutro Abs: 2.4 10*3/uL (ref 1.7–7.7)
Neutrophils Relative %: 60 %
Platelets: 114 10*3/uL — ABNORMAL LOW (ref 150–400)
RBC: 5.49 MIL/uL (ref 4.22–5.81)
RDW: 17.5 % — ABNORMAL HIGH (ref 11.5–15.5)
WBC: 3.9 10*3/uL — ABNORMAL LOW (ref 4.0–10.5)
nRBC: 0 % (ref 0.0–0.2)

## 2022-07-09 LAB — COMPREHENSIVE METABOLIC PANEL
ALT: 30 U/L (ref 0–44)
AST: 23 U/L (ref 15–41)
Albumin: 3.8 g/dL (ref 3.5–5.0)
Alkaline Phosphatase: 72 U/L (ref 38–126)
Anion gap: 5 (ref 5–15)
BUN: 18 mg/dL (ref 6–20)
CO2: 23 mmol/L (ref 22–32)
Calcium: 8.4 mg/dL — ABNORMAL LOW (ref 8.9–10.3)
Chloride: 106 mmol/L (ref 98–111)
Creatinine, Ser: 1.47 mg/dL — ABNORMAL HIGH (ref 0.61–1.24)
GFR, Estimated: 55 mL/min — ABNORMAL LOW (ref >60)
Glucose, Bld: 170 mg/dL — ABNORMAL HIGH (ref 70–99)
Potassium: 4 mmol/L (ref 3.5–5.1)
Sodium: 134 mmol/L — ABNORMAL LOW (ref 135–145)
Total Bilirubin: 0.8 mg/dL (ref 0.3–1.2)
Total Protein: 7.1 g/dL (ref 6.5–8.1)
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
## 2022-07-09 NOTE — Assessment & Plan Note (Addendum)
#   Stage III colon cancer T3N2-rectosigmoid; distal 5 mm margin; MSS. currently s/p adjuvant chemo FOLFOX x 6 months [finished November 2023]; NOV 3rd, 2023-CT chest and pelvis- No evidence of recurrent or metastatic carcinoma.  Discussed regarding surveillance/colonoscopy within 1 year. Pt  prefers to go to [place]  GI re: colonoscopy s/p colon cancer chemo.    # Epistaxis recurrent s/p cautery [currently off Eliquis]-platelets 114- Stable.  # Hx of Afib-eliquis/amiodarone-OFF eliquis [as per cardiology]- stable.   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   # Solitary kidney- GFR- creat- 1.26 [GFR- 57; hx of acute renal failure [JAN 2023-creatinine-5.3]; monitor closely.  Stable.   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  # Obesity/diabetes- I re-iterated importance of healthy weight/and weight loss.  Also recommend talking to PCP regarding weight loss medication/adjusting diabetes medication etc.  #  IV access: s/p removal- explant    # DOT clearance: ok to go back to work.   # DISPOSITION:  # Letter DOT clearance  # refer to [place]  GI re: colonoscopy s/p colon cancer chemo # Follow up in 3  months- labs- cbc/cmp; CEA-CT scan CAP- prior-  Dr.B

## 2022-07-09 NOTE — Progress Notes (Signed)
Pt would like to know if you can give him a letter for DOT to re-enstate?

## 2022-07-10 LAB — CEA: CEA: 1.1 ng/mL (ref 0.0–4.7)

## 2022-07-13 ENCOUNTER — Ambulatory Visit: Payer: Commercial Managed Care - PPO | Admitting: Medical

## 2022-07-18 ENCOUNTER — Ambulatory Visit: Payer: Commercial Managed Care - PPO | Admitting: Physician Assistant

## 2022-07-18 ENCOUNTER — Other Ambulatory Visit: Payer: Self-pay

## 2022-07-18 ENCOUNTER — Encounter: Payer: Self-pay | Admitting: Internal Medicine

## 2022-07-18 ENCOUNTER — Encounter: Payer: Self-pay | Admitting: Physician Assistant

## 2022-07-18 VITALS — BP 118/67 | HR 66 | Temp 98.0°F | Ht 71.5 in | Wt 254.4 lb

## 2022-07-18 DIAGNOSIS — R198 Other specified symptoms and signs involving the digestive system and abdomen: Secondary | ICD-10-CM

## 2022-07-18 DIAGNOSIS — Z85048 Personal history of other malignant neoplasm of rectum, rectosigmoid junction, and anus: Secondary | ICD-10-CM

## 2022-07-18 MED ORDER — NA SULFATE-K SULFATE-MG SULF 17.5-3.13-1.6 GM/177ML PO SOLN
1.0000 | Freq: Once | ORAL | 0 refills | Status: AC
  Filled 2022-07-18 – 2022-08-02 (×2): qty 354, 1d supply, fill #0

## 2022-07-18 NOTE — Patient Instructions (Signed)
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
## 2022-07-26 IMAGING — CR DG ABDOMEN 2V
1 series · 3 of 3 positions shown · non-contrast
Comparison: CT done on 03/09/2021

CLINICAL DATA: Abdominal pain and distention

EXAM:
ABDOMEN - 2 VIEW

[Series 1: dg abd 2 views · 0.14mm/px · 3 of 3 slices shown]
[im 1/3]
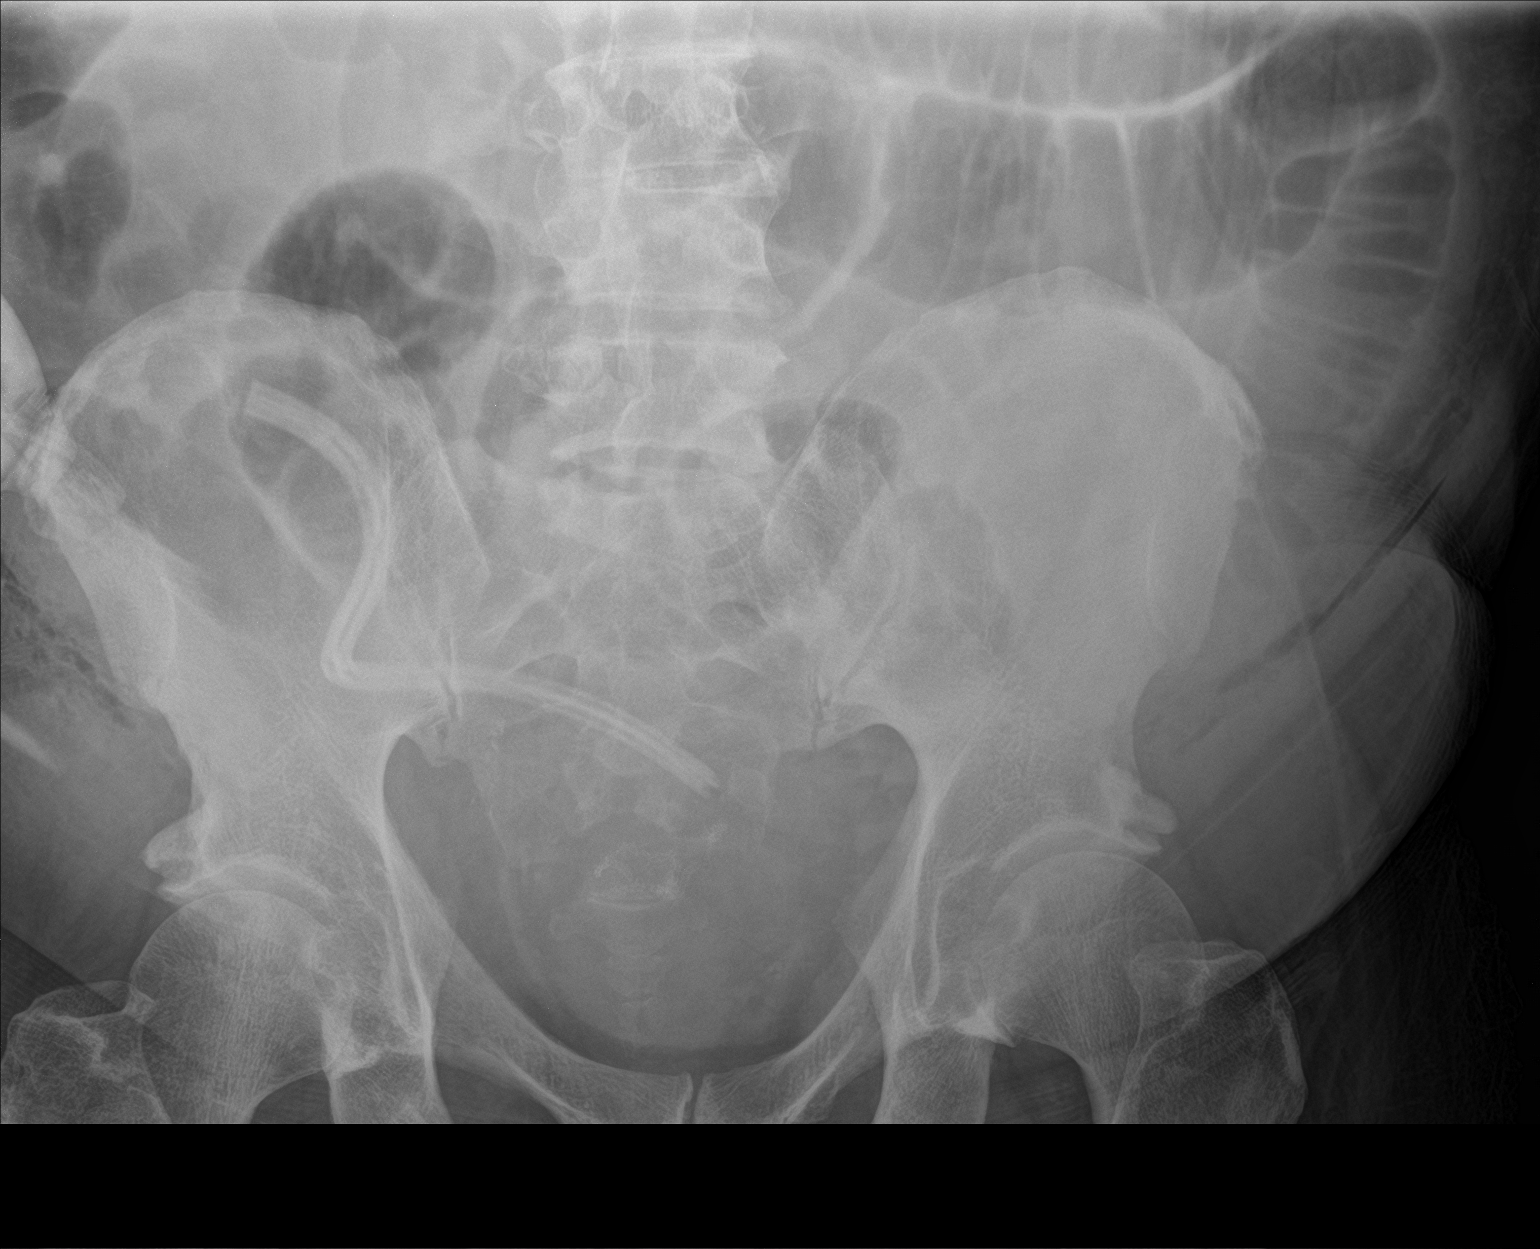
[im 2/3]
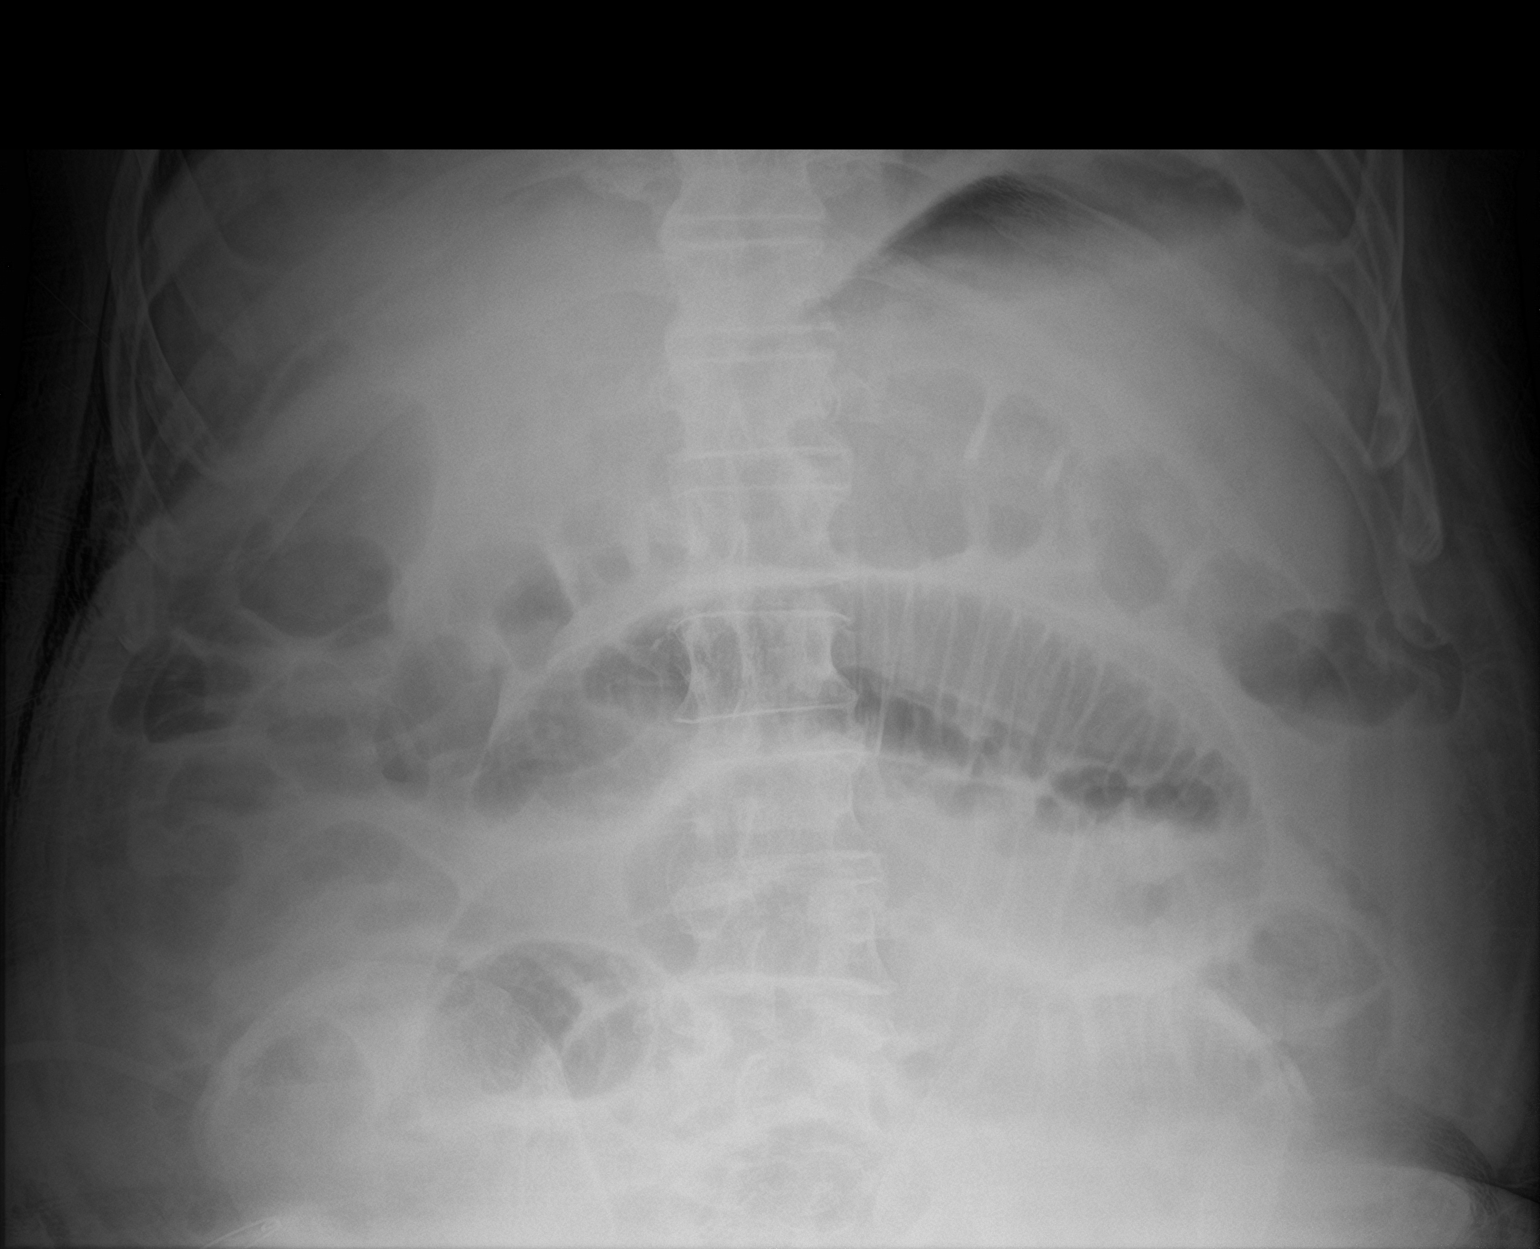
[im 3/3]
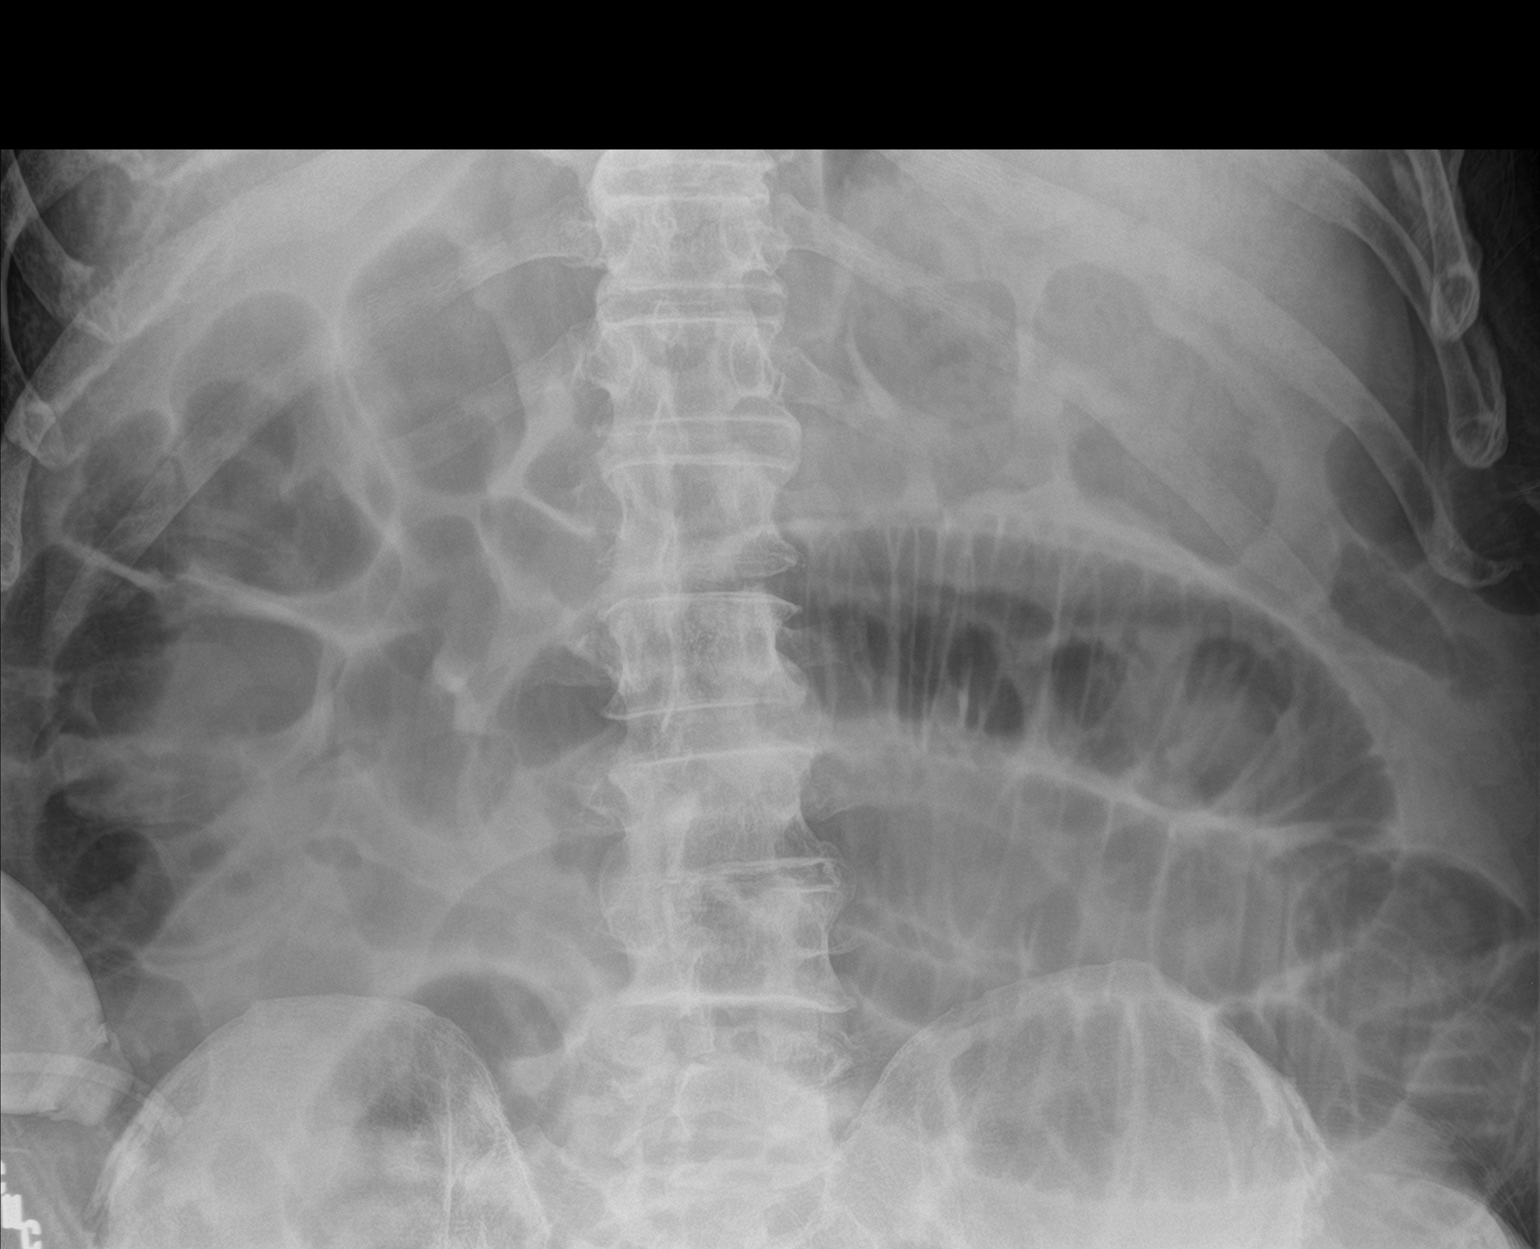

[3 of 3 positions shown; findings below may reference images not displayed]

FINDINGS: There is dilation of small-bowel loops measuring up to 6.1 cm in
diameter. Stomach is not distended. There is moderate distention of
colon. There is linear tubular structure in the right side of
pelvis. There are pockets of air in the subcutaneous plane in the
abdominal wall, possibly related to recent surgery.
IMPRESSION: There is abnormal dilation of small-bowel loops. There is moderate
gaseous distention of proximal colon. Findings suggest ileus or
distal colonic obstruction. There is a tubular structure in the
right side of pelvis which may suggest a surgical drain or foreign
body. Please correlate with clinical history.

## 2022-07-31 ENCOUNTER — Other Ambulatory Visit: Payer: Self-pay

## 2022-08-02 ENCOUNTER — Other Ambulatory Visit: Payer: Self-pay

## 2022-08-06 ENCOUNTER — Other Ambulatory Visit: Payer: Self-pay

## 2022-08-06 ENCOUNTER — Other Ambulatory Visit: Payer: Self-pay | Admitting: Cardiovascular Disease

## 2022-08-07 ENCOUNTER — Encounter: Payer: Self-pay | Admitting: Internal Medicine

## 2022-08-07 ENCOUNTER — Other Ambulatory Visit: Payer: Self-pay

## 2022-08-07 MED FILL — Amiodarone HCl Tab 200 MG: ORAL | 30 days supply | Qty: 30 | Fill #0 | Status: CN

## 2022-08-07 MED FILL — Amiodarone HCl Tab 200 MG: ORAL | 30 days supply | Qty: 30 | Fill #0 | Status: AC

## 2022-08-09 ENCOUNTER — Ambulatory Visit: Payer: Commercial Managed Care - PPO | Admitting: Anesthesiology

## 2022-08-09 ENCOUNTER — Ambulatory Visit
Admission: RE | Admit: 2022-08-09 | Discharge: 2022-08-09 | Disposition: A | Discharge status: Home / Self Care | Payer: Commercial Managed Care - PPO | Attending: Gastroenterology | Admitting: Gastroenterology

## 2022-08-09 ENCOUNTER — Encounter: Payer: Self-pay | Admitting: Gastroenterology

## 2022-08-09 ENCOUNTER — Encounter
Admission: RE | Disposition: A | Discharge status: Home / Self Care | Payer: Self-pay | Source: Home / Self Care | Attending: Gastroenterology

## 2022-08-09 ENCOUNTER — Other Ambulatory Visit: Payer: Self-pay

## 2022-08-09 DIAGNOSIS — Z1211 Encounter for screening for malignant neoplasm of colon: Secondary | ICD-10-CM | POA: Insufficient documentation

## 2022-08-09 DIAGNOSIS — R198 Other specified symptoms and signs involving the digestive system and abdomen: Secondary | ICD-10-CM | POA: Diagnosis not present

## 2022-08-09 DIAGNOSIS — Z79899 Other long term (current) drug therapy: Secondary | ICD-10-CM | POA: Diagnosis not present

## 2022-08-09 DIAGNOSIS — D122 Benign neoplasm of ascending colon: Secondary | ICD-10-CM | POA: Diagnosis not present

## 2022-08-09 DIAGNOSIS — K644 Residual hemorrhoidal skin tags: Secondary | ICD-10-CM | POA: Diagnosis not present

## 2022-08-09 DIAGNOSIS — J4530 Mild persistent asthma, uncomplicated: Secondary | ICD-10-CM | POA: Diagnosis not present

## 2022-08-09 DIAGNOSIS — K635 Polyp of colon: Secondary | ICD-10-CM | POA: Diagnosis not present

## 2022-08-09 DIAGNOSIS — I4819 Other persistent atrial fibrillation: Secondary | ICD-10-CM | POA: Insufficient documentation

## 2022-08-09 DIAGNOSIS — Z9049 Acquired absence of other specified parts of digestive tract: Secondary | ICD-10-CM | POA: Diagnosis not present

## 2022-08-09 DIAGNOSIS — I480 Paroxysmal atrial fibrillation: Secondary | ICD-10-CM | POA: Diagnosis not present

## 2022-08-09 DIAGNOSIS — G4730 Sleep apnea, unspecified: Secondary | ICD-10-CM | POA: Insufficient documentation

## 2022-08-09 DIAGNOSIS — E785 Hyperlipidemia, unspecified: Secondary | ICD-10-CM | POA: Diagnosis not present

## 2022-08-09 DIAGNOSIS — N183 Chronic kidney disease, stage 3 unspecified: Secondary | ICD-10-CM | POA: Diagnosis not present

## 2022-08-09 DIAGNOSIS — Z7951 Long term (current) use of inhaled steroids: Secondary | ICD-10-CM | POA: Insufficient documentation

## 2022-08-09 DIAGNOSIS — Z905 Acquired absence of kidney: Secondary | ICD-10-CM | POA: Insufficient documentation

## 2022-08-09 DIAGNOSIS — E1122 Type 2 diabetes mellitus with diabetic chronic kidney disease: Secondary | ICD-10-CM | POA: Diagnosis not present

## 2022-08-09 DIAGNOSIS — Z7984 Long term (current) use of oral hypoglycemic drugs: Secondary | ICD-10-CM | POA: Diagnosis not present

## 2022-08-09 DIAGNOSIS — K649 Unspecified hemorrhoids: Secondary | ICD-10-CM | POA: Diagnosis not present

## 2022-08-09 DIAGNOSIS — I129 Hypertensive chronic kidney disease with stage 1 through stage 4 chronic kidney disease, or unspecified chronic kidney disease: Secondary | ICD-10-CM | POA: Insufficient documentation

## 2022-08-09 DIAGNOSIS — Z87891 Personal history of nicotine dependence: Secondary | ICD-10-CM | POA: Insufficient documentation

## 2022-08-09 DIAGNOSIS — Z980 Intestinal bypass and anastomosis status: Secondary | ICD-10-CM | POA: Insufficient documentation

## 2022-08-09 DIAGNOSIS — Z85048 Personal history of other malignant neoplasm of rectum, rectosigmoid junction, and anus: Secondary | ICD-10-CM | POA: Diagnosis not present

## 2022-08-09 DIAGNOSIS — D128 Benign neoplasm of rectum: Secondary | ICD-10-CM | POA: Insufficient documentation

## 2022-08-09 HISTORY — DX: Type 2 diabetes mellitus without complications: E11.9

## 2022-08-09 HISTORY — PX: COLONOSCOPY WITH PROPOFOL: SHX5780

## 2022-08-09 LAB — GLUCOSE, CAPILLARY: Glucose-Capillary: 170 mg/dL — ABNORMAL HIGH (ref 70–99)

## 2022-08-09 SURGERY — COLONOSCOPY WITH PROPOFOL
Anesthesia: General

## 2022-08-09 MED ORDER — SODIUM CHLORIDE 0.9 % IV SOLN: INTRAVENOUS | Status: DC

## 2022-08-09 MED ORDER — PROPOFOL 10 MG/ML IV BOLUS
INTRAVENOUS | Status: DC | PRN
  Administered 2022-08-09: 30 mg via INTRAVENOUS
  Administered 2022-08-09: 70 mg via INTRAVENOUS
  Administered 2022-08-09: 30 mg via INTRAVENOUS

## 2022-08-09 MED ORDER — PROPOFOL 500 MG/50ML IV EMUL
INTRAVENOUS | Status: DC | PRN
  Administered 2022-08-09: 125 ug/kg/min via INTRAVENOUS

## 2022-08-09 MED ORDER — GLYCOPYRROLATE 0.2 MG/ML IJ SOLN
INTRAMUSCULAR | Status: AC
  Filled 2022-08-09: qty 1

## 2022-08-09 MED ORDER — GLYCOPYRROLATE 0.2 MG/ML IJ SOLN
INTRAMUSCULAR | Status: DC | PRN
  Administered 2022-08-09: .1 mg via INTRAVENOUS

## 2022-08-09 MED ORDER — LIDOCAINE HCL (CARDIAC) PF 100 MG/5ML IV SOSY
PREFILLED_SYRINGE | INTRAVENOUS | Status: DC | PRN
  Administered 2022-08-09: 20 mg via INTRAVENOUS

## 2022-08-09 MED ORDER — LIDOCAINE HCL (PF) 2 % IJ SOLN
INTRAMUSCULAR | Status: AC
  Filled 2022-08-09: qty 5

## 2022-08-09 NOTE — Op Note (Signed)
[facility] Gastroenterology Patient Name: Tracy Gardner Procedure Date: 08/09/2022 7:54 AM MRN: [id number] Account #: [id number] Date of Birth: 1966/02/27 Admit Type: Outpatient Age: 56 Room: [facility] ENDO ROOM 3 Gender: Male Note Status: Finalized Instrument Name: Nelda Marseille [id number] Procedure:             Colonoscopy Indications:           High risk colon cancer surveillance: Personal history                         of colon cancer, Last colonoscopy: December 2022 Providers:             Toney Reil MD, MD Referring MD:          Wilford Corner (Referring MD) Medicines:             General Anesthesia Complications:         No immediate complications. Estimated blood loss: None. Procedure:             Pre-Anesthesia Assessment:                        - Prior to the procedure, a History and Physical was                         performed, and patient medications and allergies were                         reviewed. The patient is competent. The risks and                         benefits of the procedure and the sedation options and                         risks were discussed with the patient. All questions                         were answered and informed consent was obtained.                         Patient identification and proposed procedure were                         verified by the physician, the nurse, the                         anesthesiologist, the anesthetist and the technician                         in the pre-procedure area in the procedure room in the                         endoscopy suite. Mental Status Examination: alert and                         oriented. Airway Examination: normal oropharyngeal                         airway and neck mobility. Respiratory Examination:  clear to auscultation. CV Examination: normal.                         Prophylactic Antibiotics: The patient does not require                          prophylactic antibiotics. Prior Anticoagulants: The                         patient has taken no anticoagulant or antiplatelet                         agents. ASA Grade Assessment: III - A patient with                         severe systemic disease. After reviewing the risks and                         benefits, the patient was deemed in satisfactory                         condition to undergo the procedure. The anesthesia                         plan was to use general anesthesia. Immediately prior                         to administration of medications, the patient was                         re-assessed for adequacy to receive sedatives. The                         heart rate, respiratory rate, oxygen saturations,                         blood pressure, adequacy of pulmonary ventilation, and                         response to care were monitored throughout the                         procedure. The physical status of the patient was                         re-assessed after the procedure.                        After obtaining informed consent, the colonoscope was                         passed under direct vision. Throughout the procedure,                         the patient's blood pressure, pulse, and oxygen                         saturations were monitored continuously. The  Colonoscope was introduced through the anus and                         advanced to the the cecum, identified by appendiceal                         orifice and ileocecal valve. The colonoscopy was                         performed without difficulty. The patient tolerated                         the procedure well. The quality of the bowel                         preparation was poor. The ileocecal valve, appendiceal                         orifice, and rectum were photographed. Findings:      The perianal and digital rectal examinations were normal. Pertinent       negatives  include normal sphincter tone and no palpable rectal lesions.      A diminutive polyp was found in the ascending colon. The polyp was       sessile. The polyp was removed with a cold biopsy forceps. Resection and       retrieval were complete.      A 3 mm polyp was found in the rectum. The polyp was sessile. The polyp       was removed with a cold biopsy forceps. Resection and retrieval were       complete. Estimated blood loss: none.      There was evidence of a prior functional end-to-end colo-rectal       anastomosis at 10 cm proximal to the anus. This was patent. The       anastomosis was traversed.      A 3 mm polyp was found in the anastomosis at 10cm from anal verge. The       polyp was sessile. The polyp was removed with a cold biopsy forceps.       Resection and retrieval were complete.      Non-bleeding external hemorrhoids were found during retroflexion. The       hemorrhoids were medium-sized. Impression:            - Preparation of the colon was poor.                        - One diminutive polyp in the ascending colon, removed                         with a cold biopsy forceps. Resected and retrieved.                        - One 3 mm polyp in the rectum, removed with a cold                         biopsy forceps. Resected and retrieved.                        - Patent  functional end-to-end colo-rectal anastomosis.                        - One 3 mm polyp at the anastomosis, removed with a                         cold biopsy forceps. Resected and retrieved.                        - Non-bleeding external hemorrhoids. Recommendation:        - Discharge patient to home (with escort).                        - Resume previous diet today.                        - Continue present medications.                        - Await pathology results.                        - Repeat colonoscopy in 6 months with 2 day prep                         because the bowel preparation was  suboptimal. Procedure Code(s):     --- Professional ---                        [phone], Colonoscopy, flexible; with biopsy, single or                         multiple Diagnosis Code(s):     --- Professional ---                        X91.478, Personal history of other malignant neoplasm                         of large intestine                        K64.4, Residual hemorrhoidal skin tags                        D12.2, Benign neoplasm of ascending colon                        D12.8, Benign neoplasm of rectum                        Z98.0, Intestinal bypass and anastomosis status CPT copyright 2022 American [employer] Association. All rights reserved. The codes documented in this report are preliminary and upon coder review may  be revised to meet current compliance requirements. Dr. Libby Maw Toney Reil MD, MD 08/09/2022 8:18:43 AM This report has been signed electronically. Number of Addenda: 0 Note Initiated On: 08/09/2022 7:54 AM Scope Withdrawal Time: 0 hours 12 minutes 43 seconds  Total Procedure Duration: 0 hours 14 minutes 33 seconds  Estimated Blood Loss:  Estimated blood loss: none. Estimated blood loss: none.      [facility]
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
## 2022-08-09 NOTE — Transfer of Care (Signed)
Immediate Anesthesia Transfer of Care Note  Patient: Tracy Gardner  Procedure(s) Performed: COLONOSCOPY WITH PROPOFOL  Patient Location: PACU  Anesthesia Type:General  Level of Consciousness: drowsy  Airway & Oxygen Therapy: Patient Spontanous Breathing  Post-op Assessment: Report given to RN and Post -op Vital signs reviewed and stable  Post vital signs: Reviewed and stable  Last Vitals:  Vitals Value Taken Time  BP 97/66 08/09/22 0819  Temp 97   Pulse 81 08/09/22 0820  Resp 24 08/09/22 0820  SpO2 98 % 08/09/22 0820  Vitals shown include unvalidated device data.  Last Pain:  Vitals:   08/09/22 0724  TempSrc: Temporal  PainSc: 0-No pain         Complications: No notable events documented.
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
## 2022-08-09 NOTE — Anesthesia Postprocedure Evaluation (Signed)
Anesthesia Post Note  Patient: Rorey Rauf Heard  Procedure(s) Performed: COLONOSCOPY WITH PROPOFOL  Patient location during evaluation: Endoscopy Anesthesia Type: General Level of consciousness: awake and alert Pain management: pain level controlled Vital Signs Assessment: post-procedure vital signs reviewed and stable Respiratory status: spontaneous breathing, nonlabored ventilation, respiratory function stable and patient connected to nasal cannula oxygen Cardiovascular status: blood pressure returned to baseline and stable Postop Assessment: no apparent nausea or vomiting Anesthetic complications: no   No notable events documented.   Last Vitals:  Vitals:   08/09/22 0818 08/09/22 0828  BP: 97/66 110/71  Pulse: 82 79  Resp: 16 (!) 22  Temp:    SpO2: 98% 98%    Last Pain:  Vitals:   08/09/22 0828  TempSrc:   PainSc: 0-No pain                 Corinda Gubler

## 2022-08-10 ENCOUNTER — Encounter: Payer: Self-pay | Admitting: Gastroenterology

## 2022-08-20 ENCOUNTER — Ambulatory Visit: Payer: Commercial Managed Care - PPO | Attending: Cardiovascular Disease | Admitting: Medical

## 2022-08-20 ENCOUNTER — Other Ambulatory Visit
Admission: RE | Admit: 2022-08-20 | Discharge: 2022-08-20 | Disposition: A | Discharge status: Home / Self Care | Payer: Commercial Managed Care - PPO | Attending: Cardiovascular Disease | Admitting: Cardiovascular Disease

## 2022-08-20 ENCOUNTER — Telehealth: Payer: Self-pay | Admitting: Medical

## 2022-08-20 ENCOUNTER — Encounter: Payer: Self-pay | Admitting: Medical

## 2022-08-20 VITALS — BP 126/70 | HR 73 | Ht 72.0 in | Wt 254.8 lb

## 2022-08-20 DIAGNOSIS — E782 Mixed hyperlipidemia: Secondary | ICD-10-CM

## 2022-08-20 DIAGNOSIS — E118 Type 2 diabetes mellitus with unspecified complications: Secondary | ICD-10-CM

## 2022-08-20 DIAGNOSIS — N1831 Chronic kidney disease, stage 3a: Secondary | ICD-10-CM

## 2022-08-20 DIAGNOSIS — I480 Paroxysmal atrial fibrillation: Secondary | ICD-10-CM

## 2022-08-20 DIAGNOSIS — I10 Essential (primary) hypertension: Secondary | ICD-10-CM | POA: Diagnosis not present

## 2022-08-20 DIAGNOSIS — Z024 Encounter for examination for driving license: Secondary | ICD-10-CM | POA: Diagnosis not present

## 2022-08-20 DIAGNOSIS — I420 Dilated cardiomyopathy: Secondary | ICD-10-CM

## 2022-08-20 DIAGNOSIS — Z7984 Long term (current) use of oral hypoglycemic drugs: Secondary | ICD-10-CM

## 2022-08-20 DIAGNOSIS — I5022 Chronic systolic (congestive) heart failure: Secondary | ICD-10-CM

## 2022-08-20 LAB — HEMOGLOBIN A1C
Hgb A1c MFr Bld: 7.2 % — ABNORMAL HIGH (ref 4.8–5.6)
Mean Plasma Glucose: 159.94 mg/dL
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
## 2022-08-20 NOTE — Patient Instructions (Signed)
Medication Instructions:  Your physician recommends the following medication changes.  STOP TAKING: Amiodarone 200 mg daily    *If you need a refill on your cardiac medications before your next appointment, please call your pharmacy*   Lab Work: Your provider would like for you to have following labs drawn: Hemoglobin A1C.   Please go to the [facility] entrance and check in at the front desk.  You do not need an appointment.  They are open from 7am-6 pm.   If you have labs (blood work) drawn today and your tests are completely normal, you will receive your results only by: MyChart Message (if you have MyChart) OR A paper copy in the mail If you have any lab test that is abnormal or we need to change your treatment, we will call you to review the results.   Testing/Procedures: none   Follow-Up: At [facility], you and your health needs are our priority.  As part of our continuing mission to provide you with exceptional heart care, we have created designated Provider Care Teams.  These Care Teams include your primary Cardiologist (physician) and Advanced Practice Providers (APPs -  Physician Assistants and Nurse Practitioners) who all work together to provide you with the care you need, when you need it.  We recommend signing up for the patient portal called "MyChart".  Sign up information is provided on this After Visit Summary.  MyChart is used to connect with patients for Virtual Visits (Telemedicine).  Patients are able to view lab/test results, encounter notes, upcoming appointments, etc.  Non-urgent messages can be sent to your provider as well.   To learn more about what you can do with MyChart, go to [email or web address].    Your next appointment:   1 year(s)  Provider:   You may see Julien Nordmann, MD or one of the following Advanced Practice Providers on your designated Care Team:   Nicolasa Ducking, NP Eula Listen, PA-C Cadence Fransico Michael, PA-C Charlsie Quest, NP

## 2022-08-20 NOTE — Telephone Encounter (Signed)
Patient called stating he needs an order for an echo for his DOT physical.  He states it has been over two years since his last echo.  He states he will need it done in the next 15 days.

## 2022-08-21 ENCOUNTER — Ambulatory Visit: Payer: Commercial Managed Care - PPO | Attending: Medical

## 2022-08-21 DIAGNOSIS — I480 Paroxysmal atrial fibrillation: Secondary | ICD-10-CM

## 2022-08-21 NOTE — Telephone Encounter (Signed)
Pt made aware of recommendations and verbalized understanding.    Fransico Michael, Cadence H, PA-C  Parke Poisson, RN Can we call the patient and let him know Dr. Ethelene Hal recommendations. We will order a 2 week heart monitor and he recommends an Apple watch that patient wears a significant portion of the day.   Furth, Cadence H, PA-C  You26 minutes ago (11:54 AM)    Yes, it is ok to order an echo

## 2022-09-03 ENCOUNTER — Ambulatory Visit: Payer: Commercial Managed Care - PPO | Attending: Medical

## 2022-09-03 ENCOUNTER — Telehealth: Payer: Self-pay | Admitting: Cardiovascular Disease

## 2022-09-03 DIAGNOSIS — I480 Paroxysmal atrial fibrillation: Secondary | ICD-10-CM | POA: Diagnosis not present

## 2022-09-03 DIAGNOSIS — E785 Hyperlipidemia, unspecified: Secondary | ICD-10-CM | POA: Diagnosis not present

## 2022-09-03 DIAGNOSIS — Z024 Encounter for examination for driving license: Secondary | ICD-10-CM

## 2022-09-03 DIAGNOSIS — I420 Dilated cardiomyopathy: Secondary | ICD-10-CM | POA: Diagnosis not present

## 2022-09-03 DIAGNOSIS — E119 Type 2 diabetes mellitus without complications: Secondary | ICD-10-CM | POA: Diagnosis not present

## 2022-09-03 DIAGNOSIS — I10 Essential (primary) hypertension: Secondary | ICD-10-CM | POA: Diagnosis not present

## 2022-09-03 DIAGNOSIS — I482 Chronic atrial fibrillation, unspecified: Secondary | ICD-10-CM | POA: Diagnosis not present

## 2022-09-03 DIAGNOSIS — N1831 Chronic kidney disease, stage 3a: Secondary | ICD-10-CM | POA: Diagnosis not present

## 2022-09-03 LAB — ECHOCARDIOGRAM COMPLETE
Area-P 1/2: 3.99 cm2
S' Lateral: 4.6 cm

## 2022-09-03 NOTE — Telephone Encounter (Signed)
Spoke to patient and informed him that a letter was drafted and he only needed to pick it up. Patient stated that he would pick it up when he presents to day 09/03/22 for his echocardiogram appointment. Letter given to front desk to give to patient upon check in.

## 2022-09-03 NOTE — Telephone Encounter (Signed)
Pt would like a callback regarding his needing a letter to pass his DOT physical stating he's good to go. He comes in today for an ECHO at 11:30 am but wanted to speak with a nurse first if possible. Please advise

## 2022-09-07 ENCOUNTER — Other Ambulatory Visit: Payer: Self-pay

## 2022-09-09 ENCOUNTER — Other Ambulatory Visit: Payer: Self-pay

## 2022-09-10 ENCOUNTER — Other Ambulatory Visit: Payer: Self-pay

## 2022-09-10 MED ORDER — JARDIANCE 10 MG PO TABS
10.0000 mg | ORAL_TABLET | Freq: Every day | ORAL | 11 refills | Status: DC
  Filled 2022-09-10: qty 30, 30d supply, fill #0

## 2022-09-16 DIAGNOSIS — I480 Paroxysmal atrial fibrillation: Secondary | ICD-10-CM | POA: Diagnosis not present

## 2022-09-24 ENCOUNTER — Telehealth: Payer: Self-pay | Admitting: Medical

## 2022-09-24 NOTE — Telephone Encounter (Signed)
Email notification received from [employer] stating that the patient's ZIO XT ordered on 08/21/22 was still pending return.  Serial # [id number]  I called and spoke with the patient. He states he was delayed in putting the monitor on, but just completed his wear and placed this in the mail at the [place] post office this morning.   He advised he only wore this for ~ 1.5 weeks due to his job/ sweating and trying to "keep it on as best as I could."  I expressed appreciation to the patient for wearing his monitor and advised we will await the results.

## 2022-09-27 DIAGNOSIS — I480 Paroxysmal atrial fibrillation: Secondary | ICD-10-CM | POA: Diagnosis not present

## 2022-10-03 ENCOUNTER — Encounter: Payer: Self-pay | Admitting: Internal Medicine

## 2022-10-05 ENCOUNTER — Ambulatory Visit
Admission: RE | Admit: 2022-10-05 | Discharge: 2022-10-05 | Disposition: A | Discharge status: Home / Self Care | Payer: Commercial Managed Care - PPO | Source: Ambulatory Visit | Attending: Internal Medicine | Admitting: Internal Medicine

## 2022-10-05 DIAGNOSIS — R161 Splenomegaly, not elsewhere classified: Secondary | ICD-10-CM | POA: Diagnosis not present

## 2022-10-05 DIAGNOSIS — C189 Malignant neoplasm of colon, unspecified: Secondary | ICD-10-CM | POA: Diagnosis not present

## 2022-10-05 DIAGNOSIS — C187 Malignant neoplasm of sigmoid colon: Secondary | ICD-10-CM | POA: Insufficient documentation

## 2022-10-05 DIAGNOSIS — K760 Fatty (change of) liver, not elsewhere classified: Secondary | ICD-10-CM | POA: Diagnosis not present

## 2022-10-05 LAB — POCT I-STAT CREATININE: Creatinine, Ser: 1.3 mg/dL — ABNORMAL HIGH (ref 0.61–1.24)

## 2022-10-05 MED ORDER — IOHEXOL 300 MG/ML  SOLN
100.0000 mL | Freq: Once | INTRAMUSCULAR | Status: AC | PRN
  Administered 2022-10-05: 100 mL via INTRAVENOUS

## 2022-10-05 MED ORDER — BARIUM SULFATE 2 % PO SUSP
900.0000 mL | Freq: Once | ORAL | Status: AC
  Administered 2022-10-05: 900 mL via ORAL

## 2022-10-08 ENCOUNTER — Inpatient Hospital Stay: Payer: Commercial Managed Care - PPO

## 2022-10-08 ENCOUNTER — Inpatient Hospital Stay: Payer: Commercial Managed Care - PPO | Admitting: Internal Medicine

## 2022-10-17 ENCOUNTER — Encounter: Payer: Self-pay | Admitting: Internal Medicine

## 2022-10-23 ENCOUNTER — Encounter: Payer: Self-pay | Admitting: Internal Medicine

## 2022-10-23 ENCOUNTER — Inpatient Hospital Stay: Payer: BC Managed Care – PPO | Attending: Internal Medicine

## 2022-10-23 ENCOUNTER — Other Ambulatory Visit: Payer: Self-pay

## 2022-10-23 ENCOUNTER — Inpatient Hospital Stay (HOSPITAL_BASED_OUTPATIENT_CLINIC_OR_DEPARTMENT_OTHER): Payer: BC Managed Care – PPO | Admitting: Internal Medicine

## 2022-10-23 DIAGNOSIS — Z87891 Personal history of nicotine dependence: Secondary | ICD-10-CM | POA: Insufficient documentation

## 2022-10-23 DIAGNOSIS — I700 Atherosclerosis of aorta: Secondary | ICD-10-CM | POA: Diagnosis not present

## 2022-10-23 DIAGNOSIS — E1122 Type 2 diabetes mellitus with diabetic chronic kidney disease: Secondary | ICD-10-CM | POA: Diagnosis not present

## 2022-10-23 DIAGNOSIS — K5730 Diverticulosis of large intestine without perforation or abscess without bleeding: Secondary | ICD-10-CM | POA: Insufficient documentation

## 2022-10-23 DIAGNOSIS — Z905 Acquired absence of kidney: Secondary | ICD-10-CM | POA: Diagnosis not present

## 2022-10-23 DIAGNOSIS — I10 Essential (primary) hypertension: Secondary | ICD-10-CM | POA: Insufficient documentation

## 2022-10-23 DIAGNOSIS — Z833 Family history of diabetes mellitus: Secondary | ICD-10-CM | POA: Insufficient documentation

## 2022-10-23 DIAGNOSIS — Z9221 Personal history of antineoplastic chemotherapy: Secondary | ICD-10-CM | POA: Diagnosis not present

## 2022-10-23 DIAGNOSIS — D696 Thrombocytopenia, unspecified: Secondary | ICD-10-CM | POA: Diagnosis not present

## 2022-10-23 DIAGNOSIS — Z79899 Other long term (current) drug therapy: Secondary | ICD-10-CM | POA: Diagnosis not present

## 2022-10-23 DIAGNOSIS — K760 Fatty (change of) liver, not elsewhere classified: Secondary | ICD-10-CM | POA: Insufficient documentation

## 2022-10-23 DIAGNOSIS — Z87442 Personal history of urinary calculi: Secondary | ICD-10-CM | POA: Insufficient documentation

## 2022-10-23 DIAGNOSIS — I2510 Atherosclerotic heart disease of native coronary artery without angina pectoris: Secondary | ICD-10-CM | POA: Diagnosis not present

## 2022-10-23 DIAGNOSIS — I429 Cardiomyopathy, unspecified: Secondary | ICD-10-CM | POA: Insufficient documentation

## 2022-10-23 DIAGNOSIS — C187 Malignant neoplasm of sigmoid colon: Secondary | ICD-10-CM | POA: Insufficient documentation

## 2022-10-23 DIAGNOSIS — R200 Anesthesia of skin: Secondary | ICD-10-CM | POA: Insufficient documentation

## 2022-10-23 DIAGNOSIS — R202 Paresthesia of skin: Secondary | ICD-10-CM | POA: Diagnosis not present

## 2022-10-23 DIAGNOSIS — Z8249 Family history of ischemic heart disease and other diseases of the circulatory system: Secondary | ICD-10-CM | POA: Insufficient documentation

## 2022-10-23 LAB — CBC WITH DIFFERENTIAL (CANCER CENTER ONLY)
Abs Immature Granulocytes: 0.03 10*3/uL (ref 0.00–0.07)
Basophils Absolute: 0.1 10*3/uL (ref 0.0–0.1)
Basophils Relative: 2 %
Eosinophils Absolute: 0.3 10*3/uL (ref 0.0–0.5)
Eosinophils Relative: 5 %
HCT: 46.1 % (ref 39.0–52.0)
Hemoglobin: 16 g/dL (ref 13.0–17.0)
Immature Granulocytes: 1 %
Lymphocytes Relative: 21 %
Lymphs Abs: 1.2 10*3/uL (ref 0.7–4.0)
MCH: 30.4 pg (ref 26.0–34.0)
MCHC: 34.7 g/dL (ref 30.0–36.0)
MCV: 87.6 fL (ref 80.0–100.0)
Monocytes Absolute: 0.5 10*3/uL (ref 0.1–1.0)
Monocytes Relative: 9 %
Neutro Abs: 3.4 10*3/uL (ref 1.7–7.7)
Neutrophils Relative %: 62 %
Platelet Count: 123 10*3/uL — ABNORMAL LOW (ref 150–400)
RBC: 5.26 MIL/uL (ref 4.22–5.81)
RDW: 13.4 % (ref 11.5–15.5)
WBC Count: 5.5 10*3/uL (ref 4.0–10.5)
nRBC: 0 % (ref 0.0–0.2)

## 2022-10-23 LAB — CMP (CANCER CENTER ONLY)
ALT: 34 U/L (ref 0–44)
AST: 25 U/L (ref 15–41)
Albumin: 4.2 g/dL (ref 3.5–5.0)
Alkaline Phosphatase: 71 U/L (ref 38–126)
Anion gap: 9 (ref 5–15)
BUN: 21 mg/dL — ABNORMAL HIGH (ref 6–20)
CO2: 20 mmol/L — ABNORMAL LOW (ref 22–32)
Calcium: 9 mg/dL (ref 8.9–10.3)
Chloride: 103 mmol/L (ref 98–111)
Creatinine: 1.45 mg/dL — ABNORMAL HIGH (ref 0.61–1.24)
GFR, Estimated: 56 mL/min — ABNORMAL LOW (ref >60)
Glucose, Bld: 163 mg/dL — ABNORMAL HIGH (ref 70–99)
Potassium: 4.1 mmol/L (ref 3.5–5.1)
Sodium: 132 mmol/L — ABNORMAL LOW (ref 135–145)
Total Bilirubin: 0.7 mg/dL (ref 0.3–1.2)
Total Protein: 7.4 g/dL (ref 6.5–8.1)
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
## 2022-10-23 NOTE — Assessment & Plan Note (Addendum)
#   Stage III colon cancer T3N2-rectosigmoid; distal 5 mm margin; MSS. currently s/p adjuvant chemo FOLFOX x 6 months [finished November 2023]; CT CAP- AUG 2024-Status post low rectal resection and reanastomosis. No evidence of local recurrence or metastatic disease in the chest, abdomen, or pelvis. # COLO MAY 2024- polyp x 2 [adenoma; Dr.Vanga- ? 1m/longer].  Awaiting cancer tumor marker today.  Will repeat imaging again in 6 months.  # AUG 2024- CT incidental- Occasional small nonspecific pulmonary nodules are unchanged, measuring up to 0.5 cm most likely benign incidental sequelae of prior infection or inflammation.  Continue follow-up on imaging in 6 months  # Hx of Afib-eliquis/amiodarone-OFF eliquis [as per cardiology]- stable.   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   # Solitary kidney- GFR- creat- 1.26 [GFR- 57; hx of acute renal failure [JAN 2023-creatinine-5.3]; monitor closely.  Stable.   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  # Obesity/diabetes- I re-iterated importance of healthy weight/and weight loss.  Also recommend talking to PCP regarding weight loss medication/adjusting diabetes medication etc- stable.  Defer to PCP.  #  IV access: s/p removal- explantation- PIV  #Incidental findings on Imaging  CT , 2024:  Diffuse bilateral bronchial wall thickening and very fine centrilobular nodularity, most concentrated in the lung apices, consistent with smoking-related respiratory bronchiolitis [Hx of smoking-quit 2021]; Status post right nephrectomy; Hepatic steatosis; Splenomegaly; Coronary artery disease. I reviewed/discussed/ counseled the patient.    # DISPOSITION:  # Follow up in 6 months- labs- cbc/cmp; CEA; CT CAP- Dr.B  # I reviewed the blood work- with the patient in detail; also reviewed the imaging independently [as summarized above]; and with the patient in detail.

## 2022-10-23 NOTE — Progress Notes (Signed)
CT CAP 10/05/2022. No questions/concerns today.

## 2022-10-25 ENCOUNTER — Other Ambulatory Visit: Payer: Self-pay

## 2022-11-15 ENCOUNTER — Other Ambulatory Visit: Payer: Self-pay

## 2022-11-23 ENCOUNTER — Other Ambulatory Visit: Payer: Self-pay

## 2022-11-26 ENCOUNTER — Other Ambulatory Visit: Payer: Self-pay

## 2022-12-20 ENCOUNTER — Other Ambulatory Visit (HOSPITAL_COMMUNITY): Payer: Self-pay

## 2023-01-08 ENCOUNTER — Other Ambulatory Visit: Payer: Self-pay

## 2023-02-13 ENCOUNTER — Other Ambulatory Visit: Payer: Self-pay

## 2023-03-19 ENCOUNTER — Other Ambulatory Visit (HOSPITAL_COMMUNITY): Payer: Self-pay

## 2023-04-23 ENCOUNTER — Telehealth: Payer: Self-pay | Admitting: Internal Medicine

## 2023-04-23 NOTE — Telephone Encounter (Signed)
Pt called to r/s his CT. I told him I could transfer the phone call to centralized scheduling as they are who we call to schedule these appts. He said that was good. Phone call transferred.

## 2023-04-24 ENCOUNTER — Other Ambulatory Visit: Payer: Self-pay

## 2023-04-25 ENCOUNTER — Ambulatory Visit: Payer: BC Managed Care – PPO

## 2023-04-27 ENCOUNTER — Other Ambulatory Visit: Payer: Self-pay

## 2023-04-29 ENCOUNTER — Other Ambulatory Visit: Payer: Self-pay

## 2023-04-30 ENCOUNTER — Other Ambulatory Visit: Payer: Self-pay

## 2023-05-01 ENCOUNTER — Other Ambulatory Visit: Payer: BC Managed Care – PPO

## 2023-05-01 ENCOUNTER — Ambulatory Visit: Payer: BC Managed Care – PPO | Admitting: Internal Medicine

## 2023-05-15 ENCOUNTER — Ambulatory Visit
Admission: RE | Admit: 2023-05-15 | Discharge: 2023-05-15 | Disposition: A | Discharge status: Home / Self Care | Payer: BC Managed Care – PPO | Source: Ambulatory Visit | Attending: Internal Medicine | Admitting: Internal Medicine

## 2023-05-15 DIAGNOSIS — C187 Malignant neoplasm of sigmoid colon: Secondary | ICD-10-CM | POA: Diagnosis present

## 2023-05-15 LAB — POCT I-STAT CREATININE: Creatinine, Ser: 1.5 mg/dL — ABNORMAL HIGH (ref 0.61–1.24)

## 2023-05-15 MED ORDER — IOHEXOL 300 MG/ML  SOLN
100.0000 mL | Freq: Once | INTRAMUSCULAR | Status: AC | PRN
  Administered 2023-05-15: 80 mL via INTRAVENOUS

## 2023-05-29 ENCOUNTER — Encounter: Payer: Self-pay | Admitting: Internal Medicine

## 2023-05-29 ENCOUNTER — Inpatient Hospital Stay: Payer: BC Managed Care – PPO | Admitting: Internal Medicine

## 2023-05-29 ENCOUNTER — Inpatient Hospital Stay: Payer: BC Managed Care – PPO | Attending: Internal Medicine

## 2023-05-29 VITALS — BP 126/74 | HR 68 | Temp 97.4°F | Ht 72.0 in | Wt 243.6 lb

## 2023-05-29 DIAGNOSIS — Z79899 Other long term (current) drug therapy: Secondary | ICD-10-CM | POA: Diagnosis not present

## 2023-05-29 DIAGNOSIS — E1122 Type 2 diabetes mellitus with diabetic chronic kidney disease: Secondary | ICD-10-CM | POA: Diagnosis not present

## 2023-05-29 DIAGNOSIS — R21 Rash and other nonspecific skin eruption: Secondary | ICD-10-CM | POA: Diagnosis not present

## 2023-05-29 DIAGNOSIS — I429 Cardiomyopathy, unspecified: Secondary | ICD-10-CM | POA: Diagnosis not present

## 2023-05-29 DIAGNOSIS — C187 Malignant neoplasm of sigmoid colon: Secondary | ICD-10-CM

## 2023-05-29 DIAGNOSIS — Z87891 Personal history of nicotine dependence: Secondary | ICD-10-CM | POA: Insufficient documentation

## 2023-05-29 DIAGNOSIS — Z87442 Personal history of urinary calculi: Secondary | ICD-10-CM | POA: Insufficient documentation

## 2023-05-29 DIAGNOSIS — I700 Atherosclerosis of aorta: Secondary | ICD-10-CM | POA: Insufficient documentation

## 2023-05-29 DIAGNOSIS — M7989 Other specified soft tissue disorders: Secondary | ICD-10-CM | POA: Diagnosis not present

## 2023-05-29 DIAGNOSIS — Z860101 Personal history of adenomatous and serrated colon polyps: Secondary | ICD-10-CM | POA: Diagnosis not present

## 2023-05-29 DIAGNOSIS — F17290 Nicotine dependence, other tobacco product, uncomplicated: Secondary | ICD-10-CM | POA: Insufficient documentation

## 2023-05-29 DIAGNOSIS — I2510 Atherosclerotic heart disease of native coronary artery without angina pectoris: Secondary | ICD-10-CM | POA: Insufficient documentation

## 2023-05-29 DIAGNOSIS — Z905 Acquired absence of kidney: Secondary | ICD-10-CM | POA: Diagnosis not present

## 2023-05-29 DIAGNOSIS — Z833 Family history of diabetes mellitus: Secondary | ICD-10-CM | POA: Diagnosis not present

## 2023-05-29 DIAGNOSIS — Z9221 Personal history of antineoplastic chemotherapy: Secondary | ICD-10-CM | POA: Insufficient documentation

## 2023-05-29 DIAGNOSIS — Z8249 Family history of ischemic heart disease and other diseases of the circulatory system: Secondary | ICD-10-CM | POA: Insufficient documentation

## 2023-05-29 DIAGNOSIS — N183 Chronic kidney disease, stage 3 unspecified: Secondary | ICD-10-CM | POA: Insufficient documentation

## 2023-05-29 DIAGNOSIS — I4819 Other persistent atrial fibrillation: Secondary | ICD-10-CM | POA: Diagnosis not present

## 2023-05-29 DIAGNOSIS — K760 Fatty (change of) liver, not elsewhere classified: Secondary | ICD-10-CM | POA: Insufficient documentation

## 2023-05-29 DIAGNOSIS — K5730 Diverticulosis of large intestine without perforation or abscess without bleeding: Secondary | ICD-10-CM | POA: Insufficient documentation

## 2023-05-29 DIAGNOSIS — I129 Hypertensive chronic kidney disease with stage 1 through stage 4 chronic kidney disease, or unspecified chronic kidney disease: Secondary | ICD-10-CM | POA: Diagnosis not present

## 2023-05-29 DIAGNOSIS — L4050 Arthropathic psoriasis, unspecified: Secondary | ICD-10-CM | POA: Diagnosis not present

## 2023-05-29 LAB — CBC WITH DIFFERENTIAL (CANCER CENTER ONLY)
Abs Immature Granulocytes: 0.08 10*3/uL — ABNORMAL HIGH (ref 0.00–0.07)
Basophils Absolute: 0.1 10*3/uL (ref 0.0–0.1)
Basophils Relative: 1 %
Eosinophils Absolute: 0.3 10*3/uL (ref 0.0–0.5)
Eosinophils Relative: 4 %
HCT: 45 % (ref 39.0–52.0)
Hemoglobin: 15.3 g/dL (ref 13.0–17.0)
Immature Granulocytes: 1 %
Lymphocytes Relative: 12 %
Lymphs Abs: 0.9 10*3/uL (ref 0.7–4.0)
MCH: 30.8 pg (ref 26.0–34.0)
MCHC: 34 g/dL (ref 30.0–36.0)
MCV: 90.5 fL (ref 80.0–100.0)
Monocytes Absolute: 0.5 10*3/uL (ref 0.1–1.0)
Monocytes Relative: 7 %
Neutro Abs: 5.4 10*3/uL (ref 1.7–7.7)
Neutrophils Relative %: 75 %
Platelet Count: 177 10*3/uL (ref 150–400)
RBC: 4.97 MIL/uL (ref 4.22–5.81)
RDW: 12.7 % (ref 11.5–15.5)
WBC Count: 7.2 10*3/uL (ref 4.0–10.5)
nRBC: 0 % (ref 0.0–0.2)

## 2023-05-29 LAB — CMP (CANCER CENTER ONLY)
ALT: 17 U/L (ref 0–44)
AST: 13 U/L — ABNORMAL LOW (ref 15–41)
Albumin: 3.8 g/dL (ref 3.5–5.0)
Alkaline Phosphatase: 61 U/L (ref 38–126)
Anion gap: 5 (ref 5–15)
BUN: 20 mg/dL (ref 6–20)
CO2: 24 mmol/L (ref 22–32)
Calcium: 8.9 mg/dL (ref 8.9–10.3)
Chloride: 105 mmol/L (ref 98–111)
Creatinine: 1.15 mg/dL (ref 0.61–1.24)
GFR, Estimated: >60 mL/min (ref >60)
Glucose, Bld: 134 mg/dL — ABNORMAL HIGH (ref 70–99)
Potassium: 4.1 mmol/L (ref 3.5–5.1)
Sodium: 134 mmol/L — ABNORMAL LOW (ref 135–145)
Total Bilirubin: 0.7 mg/dL (ref 0.0–1.2)
Total Protein: 7.6 g/dL (ref 6.5–8.1)

## 2023-05-29 MED ORDER — MELOXICAM 10 MG PO CAPS: 10.0000 mg | ORAL_CAPSULE | Freq: Every day | ORAL | 0 refills | Status: DC
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
## 2023-05-29 NOTE — Assessment & Plan Note (Addendum)
#   Stage III colon cancer T3N2-rectosigmoid; distal 5 mm margin; MSS. currently s/p adjuvant chemo FOLFOX x 6 months [finished November 2023]; CT CAP- MARCH 2025- Status post low rectal resection and reanastomosis without evidence of local recurrence; No convincing evidence of metastatic disease within the chest, abdomen, or pelvis.    Status post low rectal resection and reanastomosis. No evidence of local recurrence or metastatic disease in the chest, abdomen, or pelvis. # COLO MAY 2024- polyp x 2 [adenoma; Dr.Vanga- ? 22m/longer].  Awaiting cancer tumor marker today.  Will repeat imaging again in 6 months.  # Possible Psoriatic arthritis- [JAN 2025]- currently on steroidal ointment; given the joint pains/enthesitis-recommend meloxicam 10 milligrams a day [history of solitary kidney-patient currently taking about 1200 mg of ibuprofen-recommend hold off].  Awaiting evaluation with PCP tomorrow.  Recommend evaluation with rheumatology for further management.  # CT SCAN MARCH 2025- l- Occasional small nonspecific pulmonary nodules are unchanged, measuring up to 0.5 cm most likely benign incidental sequelae of prior infection or inflammation.  Continue follow-up on imaging in 6 months.   # Hx of Afib-eliquis/amiodarone-OFF eliquis [as per cardiology]- stable.   # Solitary kidney- GFR- creat- 1.26 [GFR- 57; hx of acute renal failure [JAN 2023-creatinine-5.3]; monitor closely. Stable.  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  # Obesity/diabetes- I re-iterated importance of healthy weight/and weight loss.  Also recommend talking to PCP regarding weight loss medication/adjusting diabetes medication etc- stable.  Defer to PCP.  #  IV access: s/p removal- explantation- PIV  #Incidental findings on Imaging  CT , 2024:  Diffuse bilateral bronchial wall thickening and very fine centrilobular nodularity, most concentrated in the lung apices, consistent with smoking-related respiratory bronchiolitis  [Hx of smoking-quit 2021]; Status post right nephrectomy; Hepatic steatosis; Splenomegaly; Coronary artery disease. I reviewed/discussed/ counseled the patient.    # DISPOSITION:  # Follow up in 6 months- labs- cbc/cmp; CEA; CT CAP- [facility]-- Dr.B  # I reviewed the blood work- with the patient in detail; also reviewed the imaging independently [as summarized above]; and with the patient in detail.

## 2023-05-29 NOTE — Progress Notes (Signed)
 CT chest 05/15/23.  Has a rash/contact dermatitis, diflucan and cream, on multiple body parts. Rash not getting better.  Stiffness/pain/swelling in joints, seen PA at [facility]. Has swelling in lt hand today. Pain today is 6/10.  C/o having no energy.

## 2023-05-30 ENCOUNTER — Other Ambulatory Visit: Payer: Self-pay

## 2023-05-30 ENCOUNTER — Other Ambulatory Visit: Payer: Self-pay | Admitting: *Deleted

## 2023-05-30 LAB — CEA: CEA: 1.7 ng/mL (ref 0.0–4.7)

## 2023-05-30 MED ORDER — MELOXICAM 15 MG PO TABS: 15.0000 mg | ORAL_TABLET | Freq: Every day | ORAL | 1 refills | Status: DC

## 2023-07-04 ENCOUNTER — Encounter: Payer: Self-pay | Admitting: Internal Medicine

## 2023-07-04 ENCOUNTER — Other Ambulatory Visit: Payer: Self-pay

## 2023-07-04 MED ORDER — PREDNISONE 20 MG PO TABS
20.0000 mg | ORAL_TABLET | Freq: Every day | ORAL | 0 refills | Status: DC
  Filled 2023-07-04: qty 7, 7d supply, fill #0

## 2023-07-12 ENCOUNTER — Other Ambulatory Visit: Payer: Self-pay

## 2023-07-12 ENCOUNTER — Encounter: Payer: Self-pay | Admitting: Internal Medicine

## 2023-07-12 MED ORDER — MOUNJARO 5 MG/0.5ML ~~LOC~~ SOAJ
5.0000 mg | SUBCUTANEOUS | 0 refills | Status: DC
  Filled 2023-07-12: qty 2, 28d supply, fill #0

## 2023-08-08 ENCOUNTER — Other Ambulatory Visit: Payer: Self-pay | Admitting: Cardiovascular Disease

## 2023-08-09 MED ORDER — ROSUVASTATIN CALCIUM 10 MG PO TABS: 10.0000 mg | ORAL_TABLET | Freq: Every evening | ORAL | 0 refills | Status: DC

## 2023-08-09 NOTE — Addendum Note (Signed)
 Addended by: Debrah Fan, Dyami Umbach L on: 08/09/2023 08:50 AM   Modules accepted: Orders

## 2023-08-29 ENCOUNTER — Encounter: Payer: Self-pay | Admitting: Medical

## 2023-08-29 ENCOUNTER — Ambulatory Visit: Attending: Medical | Admitting: Medical

## 2023-08-29 VITALS — BP 112/70 | HR 125 | Ht 71.0 in | Wt 226.0 lb

## 2023-08-29 DIAGNOSIS — N1831 Chronic kidney disease, stage 3a: Secondary | ICD-10-CM

## 2023-08-29 DIAGNOSIS — E782 Mixed hyperlipidemia: Secondary | ICD-10-CM

## 2023-08-29 DIAGNOSIS — Z79899 Other long term (current) drug therapy: Secondary | ICD-10-CM

## 2023-08-29 DIAGNOSIS — I480 Paroxysmal atrial fibrillation: Secondary | ICD-10-CM

## 2023-08-29 DIAGNOSIS — I429 Cardiomyopathy, unspecified: Secondary | ICD-10-CM | POA: Diagnosis not present

## 2023-08-29 DIAGNOSIS — I5022 Chronic systolic (congestive) heart failure: Secondary | ICD-10-CM | POA: Diagnosis not present

## 2023-08-29 DIAGNOSIS — I10 Essential (primary) hypertension: Secondary | ICD-10-CM | POA: Diagnosis not present

## 2023-08-29 MED ORDER — METOPROLOL SUCCINATE ER 25 MG PO TB24: 12.5000 mg | ORAL_TABLET | Freq: Every day | ORAL | 3 refills | Status: DC

## 2023-08-29 MED ORDER — APIXABAN 5 MG PO TABS: 5.0000 mg | ORAL_TABLET | Freq: Two times a day (BID) | ORAL | 3 refills | Status: AC

## 2023-08-29 NOTE — Progress Notes (Signed)
 Cardiology Office Note   Date:  08/29/2023  ID:  Tracy Gardner, DOB March 04, 1966, MRN [id number] PCP: Lenell Query, PA-C  [facility] Providers Cardiologist:  None    History of Present Illness Tracy Gardner is a 58 y.o. male with a hx of hypertension, hyperlipidemia, obesity, diabetes type 2, CKD stage III, tobacco abuse, psoriatic arthritis, asthma, OSA on CPAP, polycythemia, and paroxysmal A. fib who presents for 1 year follow-up.   Patient was hospitalized 06/2020 for new onset paroxysmal A. fib, bronchial pneumonia, diabetes, acute heart failure. Chest CT was negative for PE.  Patient was treated with IV antibiotics and IV steroids.  Echo showed reduced EF 35 to 40%.  Patient underwent initial cardioversion 4/29, but went back into A. fib 5/1.  TSH normal.  Patient was treated with Lasix  for volume overload.  He was started on amiodarone , home bisoprolol  was continued. CHA2DS2-VASc score of 2. He was sent home on Eliquis  5 mg BID. Also dicharged with lasix  40mg  daily, needs BMET and sleep study.   She patient underwent outpatient DCCV 08/06/22 with successful conversion to NSR. Repeat echo July 2022 showed normal LVEF.    Last seen 11/2021 and reported frequent nosebleeds and Eliquis  was held. Plan was he would call after he had a chance to talk with ENT.  He saw ENT and nose as cauterized.   Patient was last seen 08/20/2022 for DOT clearance, letter was provided.  Patient was still not on Eliquis  due to bleeding in the past.  Patient requested to not restart Eliquis .  MD was notified and he recommended heart monitor.  Heart monitor showed normal sinus rhythm with no recurrent A-fib.  Echo for DOT clearance showed EF of 55 to 60% trivial MR.  Today, the patient states he is overall doing OK. EKG shows Afib RVR with HR of 125bpm. He denies chest pain, SOB, heart racing, heart fluttering. He has been staying active. He uses his CPAP. No alcohol use. He was recently diagnosed with  psoriatic arthritis.  Studies Reviewed EKG Interpretation Date/Time:  Thursday August 29 2023 10:53:13 EDT Ventricular Rate:  125 PR Interval:    QRS Duration:  86 QT Interval:  308 QTC Calculation: 444 R Axis:   67  Text Interpretation: Atrial fibrillation with rapid ventricular response Nonspecific ST abnormality When compared with ECG of 04-Apr-2021 22:39, PREVIOUS ECG IS PRESENT Confirmed by Tracy Gardner, Tracy Gardner ([id number]) on 08/29/2023 11:07:04 AM    Heart monitor 09/2022  Normal sinus rhythm Patient had a min HR of 41 bpm, max HR of 121 bpm, and avg HR of 71 bpm.    1 run of Supraventricular Tachycardia occurred lasting 8 beats with a max rate of 114 bpm (avg 109 bpm).  Isolated SVEs were occasional (1.4%, 4451), and no SVE Couplets or SVE Triplets were present.  Isolated VEs were rare (<1.0%), VE Couplets were rare (<1.0%), and no VE Triplets were present.    No patient triggered events recorded   Signed, Tracy Noon, MD, Ph.D [facility]    Echo 08/2022 1. Left ventricular ejection fraction, by estimation, is 55 to 60%. The  left ventricle has normal function. The left ventricle has no regional  wall motion abnormalities. Left ventricular diastolic parameters were  normal. The average left ventricular  global longitudinal strain is -19.6 %. The global longitudinal strain is  normal.   2. Right ventricular systolic function is normal. The right ventricular  size is normal.   3. The mitral valve is  normal in structure. Trivial mitral valve  regurgitation.   4. The aortic valve is tricuspid. Aortic valve regurgitation is not  visualized.   5. The inferior vena cava is normal in size with greater than 50%  respiratory variability, suggesting right atrial pressure of 3 mmHg.        Physical Exam VS:  BP 112/70   Pulse (!) 125   Ht 5' 11 (1.803 m)   Wt 226 lb (102.5 kg)   SpO2 92%   BMI 31.52 kg/m    Wt Readings from Last 3 Encounters:  08/29/23 226 lb (102.5 kg)   05/29/23 243 lb 9.6 oz (110.5 kg)  10/23/22 253 lb 9.6 oz (115 kg)    GEN: Well nourished, well developed in no acute distress NECK: No JVD; No carotid bruits CARDIAC: Irreg Irreg, no murmurs, rubs, gallops RESPIRATORY:  Clear to auscultation without rales, wheezing or rhonchi  ABDOMEN: Soft, non-tender, non-distended EXTREMITIES:  No edema; No deformity   ASSESSMENT AND PLAN  Paroxysmal Afib Today he is in rapid Afib with heart rate in the 120s. He is completely asymptomatic. Patient previously stopped Eliquis  due to issues with nose bleeds. I will restart Eliquis  5mg  BID. I will start Toprol 12.5mg  daily. I will check BMET, CBC, TSH, and Mag. I will update an echo. We will see him back at follow-up. It still I Afib, can consider cardioversion.  HTN BP is normal. Start Toprol as above.   H/o CM Chronic systolic heart failure Echo in 2022 showed improved LVEF 55%. The patient is euvolemic on exam. Continue Jardiance  10mg  daily and Toprol 12.5mg  daily.    HLD LDL 101. Continue Crestor  10mg  daily.   CKD stage 3 Scr ~ 1        Dispo: Follow-up in 3 weeks  Signed, Tracy Risk Rebekah Canada, PA-C

## 2023-08-29 NOTE — Patient Instructions (Signed)
 Medication Instructions:  Your physician recommends the following medication changes.  START TAKING: Eliquis  5 mg twice daily Toprol-XL 12.5 mg daily  *If you need a refill on your cardiac medications before your next appointment, please call your pharmacy*  Lab Work: Your provider would like for you to have following labs drawn today BMeT, CBC, TSH, and Magnesium level.   If you have labs (blood work) drawn today and your tests are completely normal, you will receive your results only by: MyChart Message (if you have MyChart) OR A paper copy in the mail If you have any lab test that is abnormal or we need to change your treatment, we will call you to review the results.  Testing/Procedures: Your physician has requested that you have an echocardiogram. Echocardiography is a painless test that uses sound waves to create images of your heart. It provides your doctor with information about the size and shape of your heart and how well your heart's chambers and valves are working.   You may receive an ultrasound enhancing agent through an IV if needed to better visualize your heart during the echo. This procedure takes approximately one hour.  There are no restrictions for this procedure.  This will take place at [address] [facility] [address] [facility]) [address], [place] [address]  Please note: We ask at that you not bring children with you during ultrasound (echo/ vascular) testing. Due to room size and safety concerns, children are not allowed in the ultrasound rooms during exams. Our front office staff cannot provide observation of children in our lobby area while testing is being conducted. An adult accompanying a patient to their appointment will only be allowed in the ultrasound room at the discretion of the ultrasound technician under special circumstances. We apologize for any inconvenience.   Follow-Up: At [facility], you and your health needs are our priority.  As part of  our continuing mission to provide you with exceptional heart care, our providers are all part of one team.  This team includes your primary Cardiologist (physician) and Advanced Practice Providers or APPs (Physician Assistants and Nurse Practitioners) who all work together to provide you with the care you need, when you need it.  Your next appointment:   3 week(s)  Provider:   You may see Cadence Gennaro Khat, PA-C  We recommend signing up for the patient portal called MyChart.  Sign up information is provided on this After Visit Summary.  MyChart is used to connect with patients for Virtual Visits (Telemedicine).  Patients are able to view lab/test results, encounter notes, upcoming appointments, etc.  Non-urgent messages can be sent to your provider as well.   To learn more about what you can do with MyChart, go to [email or web address].

## 2023-08-30 ENCOUNTER — Ambulatory Visit: Payer: Self-pay | Admitting: Medical

## 2023-08-30 ENCOUNTER — Encounter: Payer: Self-pay | Admitting: Internal Medicine

## 2023-08-30 LAB — TSH: TSH: 0.418 u[IU]/mL — ABNORMAL LOW (ref 0.450–4.500)

## 2023-08-30 LAB — CBC
Hematocrit: 53.5 % — ABNORMAL HIGH (ref 37.5–51.0)
Hemoglobin: 17.7 g/dL (ref 13.0–17.7)
MCH: 30.3 pg (ref 26.6–33.0)
MCHC: 33.1 g/dL (ref 31.5–35.7)
MCV: 92 fL (ref 79–97)
Platelets: 152 10*3/uL (ref 150–450)
RBC: 5.85 x10E6/uL — ABNORMAL HIGH (ref 4.14–5.80)
RDW: 14.2 % (ref 11.6–15.4)
WBC: 9.8 10*3/uL (ref 3.4–10.8)

## 2023-08-30 LAB — BASIC METABOLIC PANEL WITH GFR
BUN/Creatinine Ratio: 18 (ref 9–20)
BUN: 20 mg/dL (ref 6–24)
CO2: 17 mmol/L — ABNORMAL LOW (ref 20–29)
Calcium: 9.5 mg/dL (ref 8.7–10.2)
Chloride: 101 mmol/L (ref 96–106)
Creatinine, Ser: 1.1 mg/dL (ref 0.76–1.27)
Glucose: 92 mg/dL (ref 70–99)
Potassium: 3.9 mmol/L (ref 3.5–5.2)
Sodium: 137 mmol/L (ref 134–144)
eGFR: 78 mL/min/{1.73_m2} (ref >59)

## 2023-08-30 LAB — MAGNESIUM: Magnesium: 1.8 mg/dL (ref 1.6–2.3)

## 2023-09-11 ENCOUNTER — Telehealth: Payer: Self-pay | Admitting: Cardiovascular Disease

## 2023-09-11 NOTE — Telephone Encounter (Signed)
 Pt called and wanted to know why his Echo was cancelled for tomorrow and why it was not auth along time ago.  He was upset because he is a [employer] and took the day off just for this echo.  He would like someone to give him a call 1st thing in the morning .  Pt is off all day tomorrow.

## 2023-09-12 ENCOUNTER — Ambulatory Visit

## 2023-09-12 NOTE — Telephone Encounter (Signed)
 Patient called to follow-up on the status of his authorization to have an echocardiogram.  Patient wants a call back as soon as possible.

## 2023-09-27 ENCOUNTER — Ambulatory Visit: Admitting: Medical

## 2023-11-01 ENCOUNTER — Ambulatory Visit: Attending: Medical

## 2023-11-01 DIAGNOSIS — I4891 Unspecified atrial fibrillation: Secondary | ICD-10-CM | POA: Diagnosis not present

## 2023-11-01 DIAGNOSIS — I480 Paroxysmal atrial fibrillation: Secondary | ICD-10-CM

## 2023-11-01 LAB — ECHOCARDIOGRAM COMPLETE
AR max vel: 2.11 cm2
AV Area VTI: 2.2 cm2
AV Area mean vel: 2.03 cm2
AV Mean grad: 5 mmHg
AV Peak grad: 9 mmHg
Ao pk vel: 1.5 m/s
S' Lateral: 3.8 cm

## 2023-11-06 ENCOUNTER — Other Ambulatory Visit

## 2023-11-07 ENCOUNTER — Other Ambulatory Visit

## 2023-11-16 ENCOUNTER — Other Ambulatory Visit: Payer: Self-pay

## 2023-11-21 ENCOUNTER — Ambulatory Visit: Admitting: Medical

## 2023-11-26 ENCOUNTER — Other Ambulatory Visit: Payer: Self-pay

## 2023-12-06 ENCOUNTER — Ambulatory Visit: Admitting: Internal Medicine

## 2023-12-06 ENCOUNTER — Other Ambulatory Visit

## 2023-12-10 ENCOUNTER — Encounter: Payer: Self-pay | Admitting: Internal Medicine

## 2023-12-10 ENCOUNTER — Telehealth: Payer: Self-pay | Admitting: Internal Medicine

## 2023-12-10 NOTE — Telephone Encounter (Signed)
 Called pt and left vm to set up CT appt for [facility]

## 2023-12-12 ENCOUNTER — Telehealth: Payer: Self-pay | Admitting: *Deleted

## 2023-12-12 NOTE — Telephone Encounter (Signed)
 CT orders for Chest, abdomen and Pelvis successfully faxed to [facility]. Scan due in early November.

## 2023-12-13 NOTE — Telephone Encounter (Signed)
-------------------------------------------------------------------------------   Summary: Incomplete Faxed Order -------------------------------------------------------------------------------  Subject: Incomplete Faxed Order  Clinic Representative: Called Clinic  Name of Clinic: [facility] Phone Number: [phone]  Requesting clinic listed above to fax a new order with the required missing information below to move forward with transcribing and scheduling.  Imaging Requested: CT   Order is missing: Time stamp and date    Tracy Gardner December 13, 2023 9:05 AM

## 2023-12-16 ENCOUNTER — Ambulatory Visit: Attending: Medical | Admitting: Medical

## 2023-12-16 ENCOUNTER — Encounter: Payer: Self-pay | Admitting: Medical

## 2023-12-16 VITALS — BP 110/60 | HR 117 | Ht 72.0 in | Wt 227.8 lb

## 2023-12-16 DIAGNOSIS — E782 Mixed hyperlipidemia: Secondary | ICD-10-CM

## 2023-12-16 DIAGNOSIS — I429 Cardiomyopathy, unspecified: Secondary | ICD-10-CM

## 2023-12-16 DIAGNOSIS — I5022 Chronic systolic (congestive) heart failure: Secondary | ICD-10-CM | POA: Diagnosis not present

## 2023-12-16 DIAGNOSIS — I10 Essential (primary) hypertension: Secondary | ICD-10-CM | POA: Diagnosis not present

## 2023-12-16 DIAGNOSIS — N1831 Chronic kidney disease, stage 3a: Secondary | ICD-10-CM

## 2023-12-16 DIAGNOSIS — I480 Paroxysmal atrial fibrillation: Secondary | ICD-10-CM

## 2023-12-16 MED ORDER — METOPROLOL SUCCINATE ER 25 MG PO TB24: 25.0000 mg | ORAL_TABLET | Freq: Every day | ORAL | 3 refills | Status: AC

## 2023-12-16 MED ORDER — ROSUVASTATIN CALCIUM 10 MG PO TABS: 10.0000 mg | ORAL_TABLET | Freq: Every evening | ORAL | 3 refills | Status: AC

## 2023-12-16 NOTE — Progress Notes (Signed)
 Cardiology Office Note   Date:  12/16/2023  ID:  Torri Langston Wigle, DOB [birth year], MRN [id number] PCP: Cyrus Selinda Moose, PA-C  [facility] Providers Cardiologist:  None   History of Present Illness Jaise Moser Clawson is a 58 y.o. male with a hx of hypertension, hyperlipidemia, obesity, diabetes type 2, CKD stage III, tobacco abuse, psoriatic arthritis, asthma, OSA on CPAP, polycythemia, and paroxysmal A. fib who presents for follow-up of Afib.   Patient was hospitalized 06/2020 for new onset paroxysmal A. fib, bronchial pneumonia, diabetes, acute heart failure. Chest CT was negative for PE.  Patient was treated with IV antibiotics and IV steroids.  Echo showed reduced EF 35 to 40%.  Patient underwent initial cardioversion 4/29, but went back into A. fib 5/1.  TSH normal.  Patient was treated with Lasix  for volume overload.  He was started on amiodarone , home bisoprolol  was continued. CHA2DS2-VASc score of 2. He was sent home on Eliquis  5 mg BID. Also dicharged with lasix  40mg  daily.  Repeat echo in 2022 showed EF of 55%, grade 1 diastolic dysfunction.Patient has a history frequent nosebleeds requiring cauterization, and subsequently stopped Eliquis .   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  Patient was still not on Eliquis  due to bleeding in the past.  Patient requested to not restart Eliquis .  MD was notified and he recommended heart monitor.  Heart monitor showed normal sinus rhythm with no recurrent A-fib.  Echo for DOT clearance showed EF of 55 to 60% trivial MR.  The patient was last seen 08/29/23 and was in A-fib RVR.  Eliquis  and Toprol  were restarted.  Plan was for 3-week follow-up, but patient did not follow-up.  Today, the patient is in Afib RVR. He is asymptomatic. He denies chest pain, SOB, palpitations, lower leg edema. He denies bleeding issues on Eliquis . He reports he has only been taking Eliquis  once a day.  Studies Reviewed EKG  Interpretation Date/Time:  Monday December 16 2023 09:04:01 EDT Ventricular Rate:  117 PR Interval:    QRS Duration:  84 QT Interval:  326 QTC Calculation: 454 R Axis:   48  Text Interpretation: Atrial fibrillation with rapid ventricular response When compared with ECG of 29-Aug-2023 10:53, No significant change was found Confirmed by Franchester, Kaliegh Willadsen ([id number]) on 12/16/2023 9:05:20 AM    Echo 10/2023 1. Left ventricular ejection fraction, by estimation, is 45-50%. The left  ventricle has mildly decreased function. There is significant beat-to-beat  variability in rapid atrial fibrillation. Left ventricular diastolic  parameters are indeterminate.   2. Right ventricular systolic function is normal. The right ventricular  size is normal. Tricuspid regurgitation signal is inadequate for assessing  PA pressure.   3. The inferior vena cava is normal in size with greater than 50%  respiratory variability, suggesting right atrial pressure of 3 mmHg.   4. No hemodynamically significant valvular abnormalities present.   5. Unable to directly compare to prior study dated 09/03/22 given rhythm  change.   Heart monitor 09/2022 Normal sinus rhythm Patient had a min HR of 41 bpm, max HR of 121 bpm, and avg HR of 71 bpm.    1 run of Supraventricular Tachycardia occurred lasting 8 beats with a max rate of 114 bpm (avg 109 bpm).  Isolated SVEs were occasional (1.4%, 4451), and no SVE Couplets or SVE Triplets were present.  Isolated VEs were rare (<1.0%), VE Couplets were rare (<1.0%), and no VE Triplets were present.    No patient triggered  events recorded   Signed, Velinda Lunger, MD, Ph.D [facility]     Echo 08/2022 1. Left ventricular ejection fraction, by estimation, is 55 to 60%. The  left ventricle has normal function. The left ventricle has no regional  wall motion abnormalities. Left ventricular diastolic parameters were  normal. The average left ventricular  global longitudinal strain is  -19.6 %. The global longitudinal strain is  normal.   2. Right ventricular systolic function is normal. The right ventricular  size is normal.   3. The mitral valve is normal in structure. Trivial mitral valve  regurgitation.   4. The aortic valve is tricuspid. Aortic valve regurgitation is not  visualized.   5. The inferior vena cava is normal in size with greater than 50%  respiratory variability, suggesting right atrial pressure of 3 mmHg.        Physical Exam VS:  BP 110/60 (BP Location: Left Arm, Patient Position: Sitting, Cuff Size: Normal)   Pulse (!) 117   Ht 6' (1.829 m)   Wt 227 lb 12.8 oz (103.3 kg)   SpO2 97%   BMI 30.90 kg/m        Wt Readings from Last 3 Encounters:  12/16/23 227 lb 12.8 oz (103.3 kg)  08/29/23 226 lb (102.5 kg)  05/29/23 243 lb 9.6 oz (110.5 kg)    GEN: Well nourished, well developed in no acute distress NECK: No JVD; No carotid bruits CARDIAC: Irreg Irreg, no murmurs, rubs, gallops RESPIRATORY:  Clear to auscultation without rales, wheezing or rhonchi  ABDOMEN: Soft, non-tender, non-distended EXTREMITIES:  No edema; No deformity   ASSESSMENT AND PLAN  Paroxysmal Afib The patient remains in Afib RVR with rate 117bpm. He denies any symptoms. He was restarted on Eliquis  and Toprol  at the last visit with plan for 3 week follow-up, but it has now been over 3 months. Echo showed LVEF 45-50%, likely from Afib. He has not been taking Eliquis  5mg  twice daily, he has been taking it only once daily. He denies any bleeding issues. I instructed him he needs to take Eliquis  5mg  BID for at least 4 weeks before we can cardiovert him. Take Eliquis  5mg  BID and in I will increase Toprol  to 25mg  daily. We will see him back in 3 weeks with the hopes of setting him up for cardioversion after 4 weeks.  Patient is interested in discussing the Watchman device, I will refer him to EP.  CBC today  HTN BP is okay, increase Toprol  as above for rate control.  Continue to  monitor.  H/o CM Chronic systolic heart failure Echo 2022 showed improved EF of 55%.  Most recent echocardiogram showed LVEF 45 to 50% in the setting of rapid A-fib.  Patient is euvolemic.  Continue Jardiance  10 mg daily and Toprol  25 mg daily.  Hyperlipidemia LDL 101.  Restart Crestor  10 mg daily since patient reports he was not taking it.  CKD stage III Serum creatinine around 1       Dispo: Follow-up in 3 weeks  Signed, Daeshon Grammatico VEAR Fishman, PA-C

## 2023-12-16 NOTE — Patient Instructions (Addendum)
 EP referral for consideration of watchman  Medication Instructions:  Your physician recommends the following medication changes.  START TAKING: Toprol  XL 25 mg daily  *If you need a refill on your cardiac medications before your next appointment, please call your pharmacy*  Lab Work: Your provider would like for you to have following labs drawn today CBC.   If you have labs (blood work) drawn today and your tests are completely normal, you will receive your results only by: MyChart Message (if you have MyChart) OR A paper copy in the mail If you have any lab test that is abnormal or we need to change your treatment, we will call you to review the results.  Testing/Procedures: No test ordered today   Follow-Up: At [facility], you and your health needs are our priority.  As part of our continuing mission to provide you with exceptional heart care, our providers are all part of one team.  This team includes your primary Cardiologist (physician) and Advanced Practice Providers or APPs (Physician Assistants and Nurse Practitioners) who all work together to provide you with the care you need, when you need it.  Your next appointment:   3 week(s)  Provider:   You may see one of the following Advanced Practice Providers on your designated Care Team:   Lonni Meager, NP Lesley Maffucci, PA-C Bernardino Bring, PA-C Cadence Holyoke, PA-C Tylene Lunch, NP Barnie Hila, NP

## 2023-12-17 ENCOUNTER — Ambulatory Visit: Payer: Self-pay | Admitting: Medical

## 2023-12-17 LAB — CBC
Hematocrit: 54.6 % — ABNORMAL HIGH (ref 37.5–51.0)
Hemoglobin: 18.1 g/dL — ABNORMAL HIGH (ref 13.0–17.7)
MCH: 29.7 pg (ref 26.6–33.0)
MCHC: 33.2 g/dL (ref 31.5–35.7)
MCV: 90 fL (ref 79–97)
Platelets: 150 x10E3/uL (ref 150–450)
RBC: 6.09 x10E6/uL — ABNORMAL HIGH (ref 4.14–5.80)
RDW: 13.3 % (ref 11.6–15.4)
WBC: 6.5 x10E3/uL (ref 3.4–10.8)

## 2023-12-20 ENCOUNTER — Telehealth: Payer: Self-pay | Admitting: *Deleted

## 2023-12-20 NOTE — Telephone Encounter (Signed)
 John from [facility] got a referral for MRI for this patient but needs the authorization and the numbers that come that that in order for them to go forward

## 2023-12-20 NOTE — Telephone Encounter (Signed)
 Subject: Incomplete Faxed Order  Clinic Representative: Clinic  Name of Clinic: [facility]  Phone Number: [phone]  Requesting clinic listed above to fax a new order with the required missing information below to move forward with transcribing and scheduling.  Imaging Requested: CT   Order is missing: order is missing prior authorization from insurance, left voice message for nurse triage to call back/refax order.    Tracy Gardner December 20, 2023 10:10 AM

## 2023-12-23 ENCOUNTER — Encounter: Payer: Self-pay | Admitting: Internal Medicine

## 2023-12-23 ENCOUNTER — Telehealth: Payer: Self-pay | Admitting: Internal Medicine

## 2023-12-23 NOTE — Telephone Encounter (Signed)
 Received a vm from pt saying that he needed to set up appt - called back and pt said that he was still trying to get his CT set up at [facility] - pt called back again and said that he wanted to keep appt that I made for him at [facility] because they were having issues getting the CT set up with [facility]

## 2023-12-27 ENCOUNTER — Other Ambulatory Visit: Payer: Self-pay

## 2024-01-08 ENCOUNTER — Encounter: Payer: Self-pay | Admitting: Internal Medicine

## 2024-01-09 ENCOUNTER — Other Ambulatory Visit: Payer: Self-pay

## 2024-01-09 NOTE — H&P (View-Only) (Signed)
 Electrophysiology Clinic Note    Date:  01/10/2024  Patient ID:  OLIS VIVERETTE, DOB May 30, 1965, MRN [id number] PCP:  Cyrus Selinda Moose, PA-C  Cardiologist:  Evalene Lunger, MD  Cardiology APP:  [place] Mikey DEL, PA-C  Electrophysiology APP:  Alexandrina Fiorini, NP    Discussed the use of AI scribe software for clinical note transcription with the patient, who gave verbal consent to proceed.   Patient Profile    Chief Complaint: Afib   History of Present Illness: Tracy Gardner is a 58 y.o. male with PMH notable for parox Afib, recurrent nosebleeds off OAC, HFmrEF, HTN, HLD, T2DM, CKD 3, OSA on CPAP, asthma, polycythemia, stage 3 colon Ca; seen today for EP evaluation of AFib.   Afib was initially diagnosed 2022 during hospitalization for PNA, acute HF. TTE at that time showed reduced LVEF to 35-40%. DCCV with ERAF 2 days later. He was started on amiodarone  at that time and discharged on eliquis  and lasix . He saw PA Franchester 07/2020 where he remained in rate-controlled AFib. S/p DCCV after amio loaded where he converted to sinus. Updated TTE showed recovered LVEF.  He was diagnosed with colon cancer 2023.   During visit 11/2021 with Dr. Lunger c/o nosebleeds; in sinus rhythm and eliquis  held and ENT consulted > nose cauterized.  Patient requested to stop amiodarone  08/2022 d/t no Afib and young age. Zio monitor at that time did not show any afib so amio was stopped.   He had routine follow-up with PA Franchester 08/2023 where he was in asymptomatic, afib w RVR during visit. Eliquis  restarted, updated TTE showed mid-range LVEF. Planned for close follow-up, did not complete.   He wast seen by PA Franchester 12/16/2023 where he remained in asymptomatic afib w RVR. He indicated during visit he was taking eliquis  once daily. Recommended to increase eliquis  to BID and referred to EP for watchman and AF mgmt.   On follow-up today, he clarifies that he has been taking eliquis  BID since it was restarted in June.  He does not have any current bleeding concerns, though with his history of nose bleeds and GI bleeds does not want to be on eliquis  indefinitely.  He is not particularly symptomatic currently from AFib. He does have intermittent palpitations in the evenings when he is resting. He has also noticed slightly reduced energy, but thought it was due to less activity/exercise and weather changes. He denies chest pain, chest pressure, DOE, presyncope. No increased swelling or change in appetite.  He has apple watch, not wearing currently. His watch did not alert to AFib in June, but did alert to episode in 2023.      Arrhythmia/Device History Amiodarone  - 2022-2024    ROS:  Please see the history of present illness. All other systems are reviewed and otherwise negative.    Physical Exam    VS:  BP 112/64 (BP Location: Right Arm, Patient Position: Sitting)   Pulse 90   Ht 5' 11 (1.803 m)   Wt 228 lb 12.8 oz (103.8 kg)   SpO2 97%   BMI 31.91 kg/m  BMI: Body mass index is 31.91 kg/m.           Wt Readings from Last 3 Encounters:  01/10/24 228 lb 12.8 oz (103.8 kg)  12/16/23 227 lb 12.8 oz (103.3 kg)  08/29/23 226 lb (102.5 kg)     GEN- The patient is well appearing, alert and oriented x 3 today.   Lungs- Clear to  ausculation bilaterally, normal work of breathing.  Heart- Irregularly irregular rate and rhythm, no murmurs, rubs or gallops Extremities- No peripheral edema, warm, dry   Studies Reviewed   Previous EP, cardiology notes.    EKG is ordered. Personal review of EKG from today shows:   EKG Interpretation Date/Time:  Friday January 10 2024 10:32:00 EDT Ventricular Rate:  78 PR Interval:    QRS Duration:  94 QT Interval:  360 QTC Calculation: 410 R Axis:   51  Text Interpretation: Atrial fibrillation Confirmed by Preslee Regas [phone]) on 01/10/2024 12:58:27 PM    TTE, 11/01/2023  1. Left ventricular ejection fraction, by estimation, is 45-50%. The left ventricle  has mildly decreased function. There is significant beat-to-beat variability in rapid atrial fibrillation. Left ventricular diastolic parameters are indeterminate.   2. Right ventricular systolic function is normal. The right ventricular size is normal. Tricuspid regurgitation signal is inadequate for assessing PA pressure.   3. The inferior vena cava is normal in size with greater than 50% respiratory variability, suggesting right atrial pressure of 3 mmHg.   4. No hemodynamically significant valvular abnormalities present.   5. Unable to directly compare to prior study dated 09/03/22 given rhythm change.   Long term monitor, 08/21/2022 Patch Wear Time:  4 days and 10 hours (2024-07-07T22:07:50-0400 to 2024-07-12T08:09:21-0400)   Normal sinus rhythm Patient had a min HR of 41 bpm, max HR of 121 bpm, and avg HR of 71 bpm.    1 run of Supraventricular Tachycardia occurred lasting 8 beats with a max rate of 114 bpm (avg 109 bpm).  Isolated SVEs were occasional (1.4%, 4451), and no SVE Couplets or SVE Triplets were present.  Isolated VEs were rare (<1.0%), VE Couplets were rare (<1.0%), and no VE Triplets were present.    No patient triggered events recorded  TTE, 09/03/2022  1. Left ventricular ejection fraction, by estimation, is 55 to 60%. The left ventricle has normal function. The left ventricle has no regional wall motion abnormalities. Left ventricular diastolic parameters were normal. The average left ventricular global longitudinal strain is -19.6 %. The global longitudinal strain is normal.   2. Right ventricular systolic function is normal. The right ventricular size is normal.   3. The mitral valve is normal in structure. Trivial mitral valve regurgitation.   4. The aortic valve is tricuspid. Aortic valve regurgitation is not visualized.   5. The inferior vena cava is normal in size with greater than 50% respiratory variability, suggesting right atrial pressure of 3 mmHg.   TTE,  09/15/2020 LVEF 55%  TTE, 07/07/2020  1. Left ventricular ejection fraction, by estimation, is 35 to 40%. The left ventricle has moderately decreased function. The left ventricle demonstrates global hypokinesis. The left ventricular internal cavity size was mildly dilated. Left ventricular diastolic parameters are indeterminate.   2. Right ventricular systolic function is normal. The right ventricular size is normal. There is normal pulmonary artery systolic pressure.   3. Left atrial size was mildly dilated.     Assessment and Plan     #) persis Afib Initially diagnosed with 2022 and was started on amiodarone  at that time. Amiodarone  stopped 2024 d/t no AF and young age. AFib then recurred 08/2023 He is relatively asymptomatic with only slight palpitations and fatigue. LVEF reduced, likely tachy-medicated as had recovery of LVEF when maintaining sinus rhythm We discussed AAD vs procedural treatment of AFib, specifically AF ablation.  Given reduced LVEF, favor initiating amiodarone  as bridge to AF ablation. Patient is  agreeable to this. Will start 200mg  amiodarone  BID x 2 weeks, then reduce to 200mg  daily He prefers to limit appts, so will schedule DCCV in 3-4 weeks. He will trouble shoot apple watch settings, and notify office if he converts to sinus rhythm.  Update CMP, mag, thyroid  labs today Hold mounjaro  1 week prior to DCCV  #) Hypercoag d/t persis afib #) epistaxis #) h/o GI bleed CHA2DS2-VASc Score = at least 2 [CHF History: 1, HTN History: 0, Diabetes History: 1, Stroke History: 0, Vascular Disease History: 0, Age Score: 0, Gender Score: 0].  Therefore, the patient's annual risk of stroke is 2.2 %.    Stroke ppx - 5mg  eliquis  BID, appropriately dosed No bleeding concerns currently Given his history of epistaxis and GI bleed, he is concerned about indefinite exposure to [facility] We briefly discuss LAAO with watchman procedure, and patient requests to move forward with procedure.     #) HFmrEF, previously improved LVEF Likely tachy-mediated Appears euvolemic on exam BP prevents uptitration of GDMT Continue 25mg  toprol , 25mg  jardiance  No diuretic needed at this time  Informed Consent   Shared Decision Making/Informed Consent The risks (stroke, cardiac arrhythmias rarely resulting in the need for a temporary or permanent pacemaker, skin irritation or burns and complications associated with conscious sedation including aspiration, arrhythmia, respiratory failure and death), benefits (restoration of normal sinus rhythm) and alternatives of a direct current cardioversion were explained in detail to Mr. Dubose and he agrees to proceed.        Current medicines are reviewed at length with the patient today.   The patient does not have concerns regarding his medicines.  The following changes were made today:   START amiodarone  - 200mg  BID x 2 weeks, then 200mg  daily  Labs/ tests ordered today include:  Orders Placed This Encounter  Procedures   CBC   Comprehensive metabolic panel with GFR   Magnesium   T4, free   TSH   EKG 12-Lead     Disposition: Follow up with Dr. Kennyth 1-2 months , MD only   Signed, Chantal Needle, NP  01/10/24  12:59 PM  Electrophysiology [facility]

## 2024-01-09 NOTE — Progress Notes (Unsigned)
 Electrophysiology Clinic Note    Date:  01/10/2024  Patient ID:  Tracy Gardner, DOB May 30, 1965, MRN [id number] PCP:  Cyrus Selinda Moose, PA-C  Cardiologist:  Evalene Lunger, MD  Cardiology APP:  [place] Mikey DEL, PA-C  Electrophysiology APP:  Alexandrina Fiorini, NP    Discussed the use of AI scribe software for clinical note transcription with the patient, who gave verbal consent to proceed.   Patient Profile    Chief Complaint: Afib   History of Present Illness: Tracy Gardner is a 58 y.o. male with PMH notable for parox Afib, recurrent nosebleeds off OAC, HFmrEF, HTN, HLD, T2DM, CKD 3, OSA on CPAP, asthma, polycythemia, stage 3 colon Ca; seen today for EP evaluation of AFib.   Afib was initially diagnosed 2022 during hospitalization for PNA, acute HF. TTE at that time showed reduced LVEF to 35-40%. DCCV with ERAF 2 days later. He was started on amiodarone  at that time and discharged on eliquis  and lasix . He saw PA Franchester 07/2020 where he remained in rate-controlled AFib. S/p DCCV after amio loaded where he converted to sinus. Updated TTE showed recovered LVEF.  He was diagnosed with colon cancer 2023.   During visit 11/2021 with Dr. Lunger c/o nosebleeds; in sinus rhythm and eliquis  held and ENT consulted > nose cauterized.  Patient requested to stop amiodarone  08/2022 d/t no Afib and young age. Zio monitor at that time did not show any afib so amio was stopped.   He had routine follow-up with PA Franchester 08/2023 where he was in asymptomatic, afib w RVR during visit. Eliquis  restarted, updated TTE showed mid-range LVEF. Planned for close follow-up, did not complete.   He wast seen by PA Franchester 12/16/2023 where he remained in asymptomatic afib w RVR. He indicated during visit he was taking eliquis  once daily. Recommended to increase eliquis  to BID and referred to EP for watchman and AF mgmt.   On follow-up today, he clarifies that he has been taking eliquis  BID since it was restarted in June.  He does not have any current bleeding concerns, though with his history of nose bleeds and GI bleeds does not want to be on eliquis  indefinitely.  He is not particularly symptomatic currently from AFib. He does have intermittent palpitations in the evenings when he is resting. He has also noticed slightly reduced energy, but thought it was due to less activity/exercise and weather changes. He denies chest pain, chest pressure, DOE, presyncope. No increased swelling or change in appetite.  He has apple watch, not wearing currently. His watch did not alert to AFib in June, but did alert to episode in 2023.      Arrhythmia/Device History Amiodarone  - 2022-2024    ROS:  Please see the history of present illness. All other systems are reviewed and otherwise negative.    Physical Exam    VS:  BP 112/64 (BP Location: Right Arm, Patient Position: Sitting)   Pulse 90   Ht 5' 11 (1.803 m)   Wt 228 lb 12.8 oz (103.8 kg)   SpO2 97%   BMI 31.91 kg/m  BMI: Body mass index is 31.91 kg/m.           Wt Readings from Last 3 Encounters:  01/10/24 228 lb 12.8 oz (103.8 kg)  12/16/23 227 lb 12.8 oz (103.3 kg)  08/29/23 226 lb (102.5 kg)     GEN- The patient is well appearing, alert and oriented x 3 today.   Lungs- Clear to  ausculation bilaterally, normal work of breathing.  Heart- Irregularly irregular rate and rhythm, no murmurs, rubs or gallops Extremities- No peripheral edema, warm, dry   Studies Reviewed   Previous EP, cardiology notes.    EKG is ordered. Personal review of EKG from today shows:   EKG Interpretation Date/Time:  Friday January 10 2024 10:32:00 EDT Ventricular Rate:  78 PR Interval:    QRS Duration:  94 QT Interval:  360 QTC Calculation: 410 R Axis:   51  Text Interpretation: Atrial fibrillation Confirmed by Preslee Regas [phone]) on 01/10/2024 12:58:27 PM    TTE, 11/01/2023  1. Left ventricular ejection fraction, by estimation, is 45-50%. The left ventricle  has mildly decreased function. There is significant beat-to-beat variability in rapid atrial fibrillation. Left ventricular diastolic parameters are indeterminate.   2. Right ventricular systolic function is normal. The right ventricular size is normal. Tricuspid regurgitation signal is inadequate for assessing PA pressure.   3. The inferior vena cava is normal in size with greater than 50% respiratory variability, suggesting right atrial pressure of 3 mmHg.   4. No hemodynamically significant valvular abnormalities present.   5. Unable to directly compare to prior study dated 09/03/22 given rhythm change.   Long term monitor, 08/21/2022 Patch Wear Time:  4 days and 10 hours (2024-07-07T22:07:50-0400 to 2024-07-12T08:09:21-0400)   Normal sinus rhythm Patient had a min HR of 41 bpm, max HR of 121 bpm, and avg HR of 71 bpm.    1 run of Supraventricular Tachycardia occurred lasting 8 beats with a max rate of 114 bpm (avg 109 bpm).  Isolated SVEs were occasional (1.4%, 4451), and no SVE Couplets or SVE Triplets were present.  Isolated VEs were rare (<1.0%), VE Couplets were rare (<1.0%), and no VE Triplets were present.    No patient triggered events recorded  TTE, 09/03/2022  1. Left ventricular ejection fraction, by estimation, is 55 to 60%. The left ventricle has normal function. The left ventricle has no regional wall motion abnormalities. Left ventricular diastolic parameters were normal. The average left ventricular global longitudinal strain is -19.6 %. The global longitudinal strain is normal.   2. Right ventricular systolic function is normal. The right ventricular size is normal.   3. The mitral valve is normal in structure. Trivial mitral valve regurgitation.   4. The aortic valve is tricuspid. Aortic valve regurgitation is not visualized.   5. The inferior vena cava is normal in size with greater than 50% respiratory variability, suggesting right atrial pressure of 3 mmHg.   TTE,  09/15/2020 LVEF 55%  TTE, 07/07/2020  1. Left ventricular ejection fraction, by estimation, is 35 to 40%. The left ventricle has moderately decreased function. The left ventricle demonstrates global hypokinesis. The left ventricular internal cavity size was mildly dilated. Left ventricular diastolic parameters are indeterminate.   2. Right ventricular systolic function is normal. The right ventricular size is normal. There is normal pulmonary artery systolic pressure.   3. Left atrial size was mildly dilated.     Assessment and Plan     #) persis Afib Initially diagnosed with 2022 and was started on amiodarone  at that time. Amiodarone  stopped 2024 d/t no AF and young age. AFib then recurred 08/2023 He is relatively asymptomatic with only slight palpitations and fatigue. LVEF reduced, likely tachy-medicated as had recovery of LVEF when maintaining sinus rhythm We discussed AAD vs procedural treatment of AFib, specifically AF ablation.  Given reduced LVEF, favor initiating amiodarone  as bridge to AF ablation. Patient is  agreeable to this. Will start 200mg  amiodarone  BID x 2 weeks, then reduce to 200mg  daily He prefers to limit appts, so will schedule DCCV in 3-4 weeks. He will trouble shoot apple watch settings, and notify office if he converts to sinus rhythm.  Update CMP, mag, thyroid  labs today Hold mounjaro  1 week prior to DCCV  #) Hypercoag d/t persis afib #) epistaxis #) h/o GI bleed CHA2DS2-VASc Score = at least 2 [CHF History: 1, HTN History: 0, Diabetes History: 1, Stroke History: 0, Vascular Disease History: 0, Age Score: 0, Gender Score: 0].  Therefore, the patient's annual risk of stroke is 2.2 %.    Stroke ppx - 5mg  eliquis  BID, appropriately dosed No bleeding concerns currently Given his history of epistaxis and GI bleed, he is concerned about indefinite exposure to [facility] We briefly discuss LAAO with watchman procedure, and patient requests to move forward with procedure.     #) HFmrEF, previously improved LVEF Likely tachy-mediated Appears euvolemic on exam BP prevents uptitration of GDMT Continue 25mg  toprol , 25mg  jardiance  No diuretic needed at this time  Informed Consent   Shared Decision Making/Informed Consent The risks (stroke, cardiac arrhythmias rarely resulting in the need for a temporary or permanent pacemaker, skin irritation or burns and complications associated with conscious sedation including aspiration, arrhythmia, respiratory failure and death), benefits (restoration of normal sinus rhythm) and alternatives of a direct current cardioversion were explained in detail to Mr. Dubose and he agrees to proceed.        Current medicines are reviewed at length with the patient today.   The patient does not have concerns regarding his medicines.  The following changes were made today:   START amiodarone  - 200mg  BID x 2 weeks, then 200mg  daily  Labs/ tests ordered today include:  Orders Placed This Encounter  Procedures   CBC   Comprehensive metabolic panel with GFR   Magnesium   T4, free   TSH   EKG 12-Lead     Disposition: Follow up with Dr. Kennyth 1-2 months , MD only   Signed, Chantal Needle, NP  01/10/24  12:59 PM  Electrophysiology [facility]

## 2024-01-10 ENCOUNTER — Encounter: Payer: Self-pay | Admitting: Internal Medicine

## 2024-01-10 ENCOUNTER — Ambulatory Visit: Attending: Cardiology | Admitting: Cardiology

## 2024-01-10 ENCOUNTER — Encounter: Payer: Self-pay | Admitting: Cardiology

## 2024-01-10 ENCOUNTER — Ambulatory Visit: Admitting: Medical

## 2024-01-10 ENCOUNTER — Other Ambulatory Visit: Payer: Self-pay | Admitting: Internal Medicine

## 2024-01-10 ENCOUNTER — Telehealth: Payer: Self-pay

## 2024-01-10 VITALS — BP 112/64 | HR 90 | Ht 71.0 in | Wt 228.8 lb

## 2024-01-10 DIAGNOSIS — I5020 Unspecified systolic (congestive) heart failure: Secondary | ICD-10-CM | POA: Diagnosis not present

## 2024-01-10 DIAGNOSIS — Z79899 Other long term (current) drug therapy: Secondary | ICD-10-CM

## 2024-01-10 DIAGNOSIS — D6869 Other thrombophilia: Secondary | ICD-10-CM | POA: Diagnosis not present

## 2024-01-10 DIAGNOSIS — I4819 Other persistent atrial fibrillation: Secondary | ICD-10-CM

## 2024-01-10 DIAGNOSIS — I480 Paroxysmal atrial fibrillation: Secondary | ICD-10-CM

## 2024-01-10 MED ORDER — AMIODARONE HCL 200 MG PO TABS: 200.0000 mg | ORAL_TABLET | Freq: Two times a day (BID) | ORAL | 0 refills | Status: DC

## 2024-01-10 MED ORDER — AMIODARONE HCL 200 MG PO TABS: 200.0000 mg | ORAL_TABLET | Freq: Every day | ORAL | 3 refills | Status: DC

## 2024-01-10 NOTE — Telephone Encounter (Signed)
-----   Message from Fonda Kitty sent at 01/10/2024 10:19 AM EDT ----- Regarding: RE: concomitant Go ahead order CT scan so that I can know candidacy when we meet in clinic. Likely concomitant with ICE in January. Will need to see sometime in December to finalize.   Josh ----- Message ----- From: Riddle, Suzann, NP Sent: 01/10/2024  10:14 AM EDT To: Lamarr DELENA Redman, RN; Doreatha Ridgel, RN; J# Subject: concomitant                                    HI team,  I saw this patient earlier today for new EP consult for AFib.  He has history of nose bleeds requiring cauterization, GI bleeding that led to diagnosis of colon cancer. He is on eliquis  now and tolerating well, but given his history of bleeding does not want to be on eliquis  indefinitely.   He also needs ablation and appears to be good ablation candidate.   Can we get the ball rolling for concomitant?  I am setting him up with DCCV in 3 weeks, and then will see JP in clinic afterwards.     Thank you all, Suzann

## 2024-01-10 NOTE — Progress Notes (Deleted)
  Cardiology Office Note   Date:  01/10/2024  ID:  Tracy Gardner, DOB [birth year], MRN [id number] PCP: Cyrus Selinda Moose, PA-C  [facility] Providers Cardiologist:  Evalene Lunger, MD Cardiology APP:  [place] Mikey DEL, PA-C  Electrophysiology APP:  Riddle, Suzann, NP { Click to update primary MD,subspecialty MD or APP then REFRESH:1}    History of Present Illness Tracy Gardner is a 58 y.o. male with a hx of hypertension, hyperlipidemia, obesity, diabetes type 2, CKD stage III, tobacco abuse, psoriatic arthritis, asthma, OSA on CPAP, polycythemia, and paroxysmal A. fib who presents for follow-up of Afib.   Patient was hospitalized 06/2020 for new onset paroxysmal A. fib, bronchial pneumonia, diabetes, acute heart failure. Chest CT was negative for PE.  Patient was treated with IV antibiotics and IV steroids.  Echo showed reduced EF 35 to 40%.  Patient underwent initial cardioversion 4/29, but went back into A. fib 5/1.  TSH normal.  Patient was treated with Lasix  for volume overload.  He was started on amiodarone , home bisoprolol  was continued. CHA2DS2-VASc score of 2. He was sent home on Eliquis  5 mg BID. Also dicharged with lasix  40mg  daily.  Repeat echo in 2022 showed EF of 55%, grade 1 diastolic dysfunction.Patient has a history frequent nosebleeds requiring cauterization, and subsequently stopped Eliquis .   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  Patient was still not on Eliquis  due to bleeding in the past.  Patient requested to not restart Eliquis .  MD was notified and he recommended heart monitor.  Heart monitor showed normal sinus rhythm with no recurrent A-fib.  Echo for DOT clearance showed EF of 55 to 60% trivial MR.   The patient was seen 08/29/23 and was in A-fib RVR.  Eliquis  and Toprol  were restarted.  Plan was for 3-week follow-up, but patient did not follow-up.  The patient was last seen 12/16/23 and was in Afib RVR and was asymptomatic.   ROS:  ***  Studies Reviewed      *** Risk Assessment/Calculations {Does this patient have ATRIAL FIBRILLATION?:[phone]} No BP recorded.  {Refresh Note OR Click here to enter BP  :1}***       Physical Exam VS:  There were no vitals taken for this visit.       Wt Readings from Last 3 Encounters:  12/16/23 227 lb 12.8 oz (103.3 kg)  08/29/23 226 lb (102.5 kg)  05/29/23 243 lb 9.6 oz (110.5 kg)    GEN: Well nourished, well developed in no acute distress NECK: No JVD; No carotid bruits CARDIAC: ***RRR, no murmurs, rubs, gallops RESPIRATORY:  Clear to auscultation without rales, wheezing or rhonchi  ABDOMEN: Soft, non-tender, non-distended EXTREMITIES:  No edema; No deformity   ASSESSMENT AND PLAN ***    {Are you ordering a CV Procedure (e.g. stress test, cath, DCCV, TEE, etc)?   Press F2        :[id number]}  Dispo: ***  Signed, Shresta Risden DEL Franchester, PA-C

## 2024-01-10 NOTE — Telephone Encounter (Signed)
 Per Dr. Kennyth, will arrange CT scan prior to appointment to be scheduled in December. CMET completed today.

## 2024-01-10 NOTE — Patient Instructions (Addendum)
 Medication Instructions:  Your physician recommends the following medication changes.  START TAKING: Amiodarone  200 mg by mouth twice daily x 2 weeks, then change to 200 mg once daily.  Continue all other medications as prescribed.  *If you need a refill on your cardiac medications before your next appointment, please call your pharmacy*  Lab Work: Your provider would like for you to have following labs drawn today CMP, TSH, T4, MAG, CBC.   If you have labs (blood work) drawn today and your tests are completely normal, you will receive your results only by: MyChart Message (if you have MyChart) OR A paper copy in the mail If you have any lab test that is abnormal or we need to change your treatment, we will call you to review the results.  Testing/Procedures:   { Dear Tracy Gardner  You are scheduled for a Cardioversion on Wednesday, November 26 with Dr. Timothy Gollan.  Please arrive at the [facility] Entrance of [facility], [address], [place] [address] at 6:30 AM (This is 1 hour(s) prior to your procedure time).  Proceed to the Check-In Desk directly inside the entrance.  Procedure Parking: Use the entrance off of the [address] side of the hospital. Turn right upon entering and follow the driveway to parking that is directly in front of the [facility]. There is no valet parking available at this entrance, however there is an awning directly in front of the [facility] for drop off/ pick up for patients.   DIET:  Nothing to eat or drink after midnight except a sip of water  with medications (see medication instructions below)  MEDICATION INSTRUCTIONS: !!IF ANY NEW MEDICATIONS ARE STARTED AFTER TODAY, PLEASE NOTIFY YOUR PROVIDER AS SOON AS POSSIBLE!!  FYI: Medications such as Semaglutide (Ozempic, [place]), Tirzepatide  (Mounjaro , Zepbound ), Dulaglutide (Trulicity), etc (GLP1 agonists) AND Canagliflozin (Invokana), Dapagliflozin (Farxiga),  Empagliflozin  (Jardiance ), Ertugliflozin (Ste[place]atro), Bexagliflozin [employer]) or any combination with one of these drugs such as Invokamet (Canagliflozin/Metformin), Synjardy (Empagliflozin /Metformin), etc (SGLT2 inhibitors) must be held around the time of a procedure. This is not a comprehensive list of all of these drugs. Please review all of your medications and talk to your provider if you take any one of these. If you are not sure, ask your provider.  HOLD: Tirzepatide  (Mounjaro , Zepbound )  for 3 days prior to the procedure (72 HOURS) . Last dose on Friday, November  21.   HOLD: Empagliflozin  (Jardiance ) for 3 days prior to the procedure. Last dose on Saturday, November 22.  Continue taking your anticoagulant (blood thinner): Apixaban  (Eliquis ).  You will need to continue this after your procedure until you are told by your provider that it is safe to stop.    LABS: Labs completed on 01/10/2024 at [facility]  FYI:  For your safety, and to allow us  to monitor your vital signs accurately during the surgery/procedure we request: If you have artificial nails, gel coating, SNS etc, please have those removed prior to your surgery/procedure. Not having the nail coverings /polish removed may result in cancellation or delay of your surgery/procedure.  Your support person will be asked to wait in the waiting room during your procedure.  It is OK to have someone drop you off and come back when you are ready to be discharged.  You cannot drive after the procedure and will need someone to drive you home.  Bring your insurance cards.  *Special Note: Every effort is made  to have your procedure done on time. Occasionally there are emergencies that occur at the hospital that may cause delays. Please be patient if a delay does occur.      Follow-Up: At [facility], you and your health needs are our priority.  As part of our continuing mission to provide you with exceptional  heart care, our providers are all part of one team.  This team includes your primary Cardiologist (physician) and Advanced Practice Providers or APPs (Physician Assistants and Nurse Practitioners) who all work together to provide you with the care you need, when you need it.  Your next appointment:   To be scheduled in December after Feb 14, 2024    Provider:  Dr. Fonda Kitty    We recommend signing up for the patient portal called MyChart.  Sign up information is provided on this After Visit Summary.  MyChart is used to connect with patients for Virtual Visits (Telemedicine).  Patients are able to view lab/test results, encounter notes, upcoming appointments, etc.  Non-urgent messages can be sent to your provider as well.   To learn more about what you can do with MyChart, go to [email or web address].

## 2024-01-11 ENCOUNTER — Encounter: Payer: Self-pay | Admitting: Internal Medicine

## 2024-01-11 ENCOUNTER — Other Ambulatory Visit: Payer: Self-pay

## 2024-01-11 LAB — MAGNESIUM: Magnesium: 2.1 mg/dL (ref 1.6–2.3)

## 2024-01-11 LAB — COMPREHENSIVE METABOLIC PANEL WITH GFR
ALT: 20 IU/L (ref 0–44)
AST: 14 IU/L (ref 0–40)
Albumin: 4.4 g/dL (ref 3.8–4.9)
Alkaline Phosphatase: 97 IU/L (ref 47–123)
BUN/Creatinine Ratio: 11 (ref 9–20)
BUN: 14 mg/dL (ref 6–24)
Bilirubin Total: 0.8 mg/dL (ref 0.0–1.2)
CO2: 22 mmol/L (ref 20–29)
Calcium: 9.3 mg/dL (ref 8.7–10.2)
Chloride: 101 mmol/L (ref 96–106)
Creatinine, Ser: 1.25 mg/dL (ref 0.76–1.27)
Globulin, Total: 2.7 g/dL (ref 1.5–4.5)
Glucose: 120 mg/dL — ABNORMAL HIGH (ref 70–99)
Potassium: 4.2 mmol/L (ref 3.5–5.2)
Sodium: 139 mmol/L (ref 134–144)
Total Protein: 7.1 g/dL (ref 6.0–8.5)
eGFR: 67 mL/min/1.73 (ref >59)

## 2024-01-11 LAB — CBC
Hematocrit: 55.3 % — ABNORMAL HIGH (ref 37.5–51.0)
Hemoglobin: 18.8 g/dL — ABNORMAL HIGH (ref 13.0–17.7)
MCH: 30.8 pg (ref 26.6–33.0)
MCHC: 34 g/dL (ref 31.5–35.7)
MCV: 91 fL (ref 79–97)
Platelets: 124 x10E3/uL — ABNORMAL LOW (ref 150–450)
RBC: 6.11 x10E6/uL — ABNORMAL HIGH (ref 4.14–5.80)
RDW: 13.8 % (ref 11.6–15.4)
WBC: 6.2 x10E3/uL (ref 3.4–10.8)

## 2024-01-11 LAB — TSH: TSH: 1.65 u[IU]/mL (ref 0.450–4.500)

## 2024-01-11 LAB — T4, FREE: Free T4: 1.19 ng/dL (ref 0.82–1.77)

## 2024-01-13 ENCOUNTER — Ambulatory Visit: Payer: Self-pay | Admitting: Cardiology

## 2024-01-13 ENCOUNTER — Other Ambulatory Visit

## 2024-01-13 ENCOUNTER — Ambulatory Visit
Admission: RE | Admit: 2024-01-13 | Discharge: 2024-01-13 | Disposition: A | Discharge status: Home / Self Care | Source: Ambulatory Visit | Attending: Internal Medicine | Admitting: Internal Medicine

## 2024-01-13 DIAGNOSIS — C187 Malignant neoplasm of sigmoid colon: Secondary | ICD-10-CM

## 2024-01-13 MED ORDER — IOPAMIDOL (ISOVUE-300) INJECTION 61%
100.0000 mL | Freq: Once | INTRAVENOUS | Status: AC | PRN
  Administered 2024-01-13: 100 mL via INTRAVENOUS

## 2024-01-13 NOTE — Telephone Encounter (Signed)
 Spoke with patient and wife. Offered CT scan dates for early December but patient will be out of town. Arranged prewatchman CT for 02/28/2024.

## 2024-01-13 NOTE — Addendum Note (Signed)
 Addended by: WAYLON EDSEL HERO on: 01/13/2024 09:11 AM   Modules accepted: Orders

## 2024-01-14 ENCOUNTER — Other Ambulatory Visit: Payer: Self-pay

## 2024-01-27 ENCOUNTER — Inpatient Hospital Stay: Attending: Internal Medicine

## 2024-01-27 ENCOUNTER — Inpatient Hospital Stay: Admitting: Internal Medicine

## 2024-01-27 ENCOUNTER — Encounter: Payer: Self-pay | Admitting: Internal Medicine

## 2024-01-27 VITALS — BP 101/62 | HR 88 | Temp 98.1°F | Resp 18 | Ht 71.0 in | Wt 222.0 lb

## 2024-01-27 DIAGNOSIS — M549 Dorsalgia, unspecified: Secondary | ICD-10-CM | POA: Diagnosis not present

## 2024-01-27 DIAGNOSIS — Z833 Family history of diabetes mellitus: Secondary | ICD-10-CM | POA: Diagnosis not present

## 2024-01-27 DIAGNOSIS — Z860101 Personal history of adenomatous and serrated colon polyps: Secondary | ICD-10-CM | POA: Insufficient documentation

## 2024-01-27 DIAGNOSIS — R634 Abnormal weight loss: Secondary | ICD-10-CM | POA: Diagnosis not present

## 2024-01-27 DIAGNOSIS — Z8701 Personal history of pneumonia (recurrent): Secondary | ICD-10-CM | POA: Diagnosis not present

## 2024-01-27 DIAGNOSIS — Z8249 Family history of ischemic heart disease and other diseases of the circulatory system: Secondary | ICD-10-CM | POA: Diagnosis not present

## 2024-01-27 DIAGNOSIS — I129 Hypertensive chronic kidney disease with stage 1 through stage 4 chronic kidney disease, or unspecified chronic kidney disease: Secondary | ICD-10-CM | POA: Diagnosis not present

## 2024-01-27 DIAGNOSIS — R21 Rash and other nonspecific skin eruption: Secondary | ICD-10-CM | POA: Insufficient documentation

## 2024-01-27 DIAGNOSIS — Z87891 Personal history of nicotine dependence: Secondary | ICD-10-CM | POA: Insufficient documentation

## 2024-01-27 DIAGNOSIS — N1831 Chronic kidney disease, stage 3a: Secondary | ICD-10-CM | POA: Insufficient documentation

## 2024-01-27 DIAGNOSIS — R918 Other nonspecific abnormal finding of lung field: Secondary | ICD-10-CM | POA: Diagnosis not present

## 2024-01-27 DIAGNOSIS — R040 Epistaxis: Secondary | ICD-10-CM | POA: Diagnosis not present

## 2024-01-27 DIAGNOSIS — I4819 Other persistent atrial fibrillation: Secondary | ICD-10-CM | POA: Insufficient documentation

## 2024-01-27 DIAGNOSIS — Z87442 Personal history of urinary calculi: Secondary | ICD-10-CM | POA: Insufficient documentation

## 2024-01-27 DIAGNOSIS — M2550 Pain in unspecified joint: Secondary | ICD-10-CM | POA: Insufficient documentation

## 2024-01-27 DIAGNOSIS — I429 Cardiomyopathy, unspecified: Secondary | ICD-10-CM | POA: Diagnosis not present

## 2024-01-27 DIAGNOSIS — Z9221 Personal history of antineoplastic chemotherapy: Secondary | ICD-10-CM | POA: Diagnosis not present

## 2024-01-27 DIAGNOSIS — Z905 Acquired absence of kidney: Secondary | ICD-10-CM | POA: Diagnosis not present

## 2024-01-27 DIAGNOSIS — Z79899 Other long term (current) drug therapy: Secondary | ICD-10-CM | POA: Diagnosis not present

## 2024-01-27 DIAGNOSIS — C187 Malignant neoplasm of sigmoid colon: Secondary | ICD-10-CM | POA: Diagnosis not present

## 2024-01-27 DIAGNOSIS — E1122 Type 2 diabetes mellitus with diabetic chronic kidney disease: Secondary | ICD-10-CM | POA: Diagnosis not present

## 2024-01-27 DIAGNOSIS — R5383 Other fatigue: Secondary | ICD-10-CM | POA: Diagnosis not present

## 2024-01-27 LAB — CBC WITH DIFFERENTIAL (CANCER CENTER ONLY)
Abs Immature Granulocytes: 0.05 K/uL (ref 0.00–0.07)
Basophils Absolute: 0.1 K/uL (ref 0.0–0.1)
Basophils Relative: 1 %
Eosinophils Absolute: 0.2 K/uL (ref 0.0–0.5)
Eosinophils Relative: 4 %
HCT: 51.2 % (ref 39.0–52.0)
Hemoglobin: 18 g/dL — ABNORMAL HIGH (ref 13.0–17.0)
Immature Granulocytes: 1 %
Lymphocytes Relative: 13 %
Lymphs Abs: 0.8 K/uL (ref 0.7–4.0)
MCH: 30.9 pg (ref 26.0–34.0)
MCHC: 35.2 g/dL (ref 30.0–36.0)
MCV: 88 fL (ref 80.0–100.0)
Monocytes Absolute: 0.4 K/uL (ref 0.1–1.0)
Monocytes Relative: 7 %
Neutro Abs: 4.3 K/uL (ref 1.7–7.7)
Neutrophils Relative %: 74 %
Platelet Count: 128 K/uL — ABNORMAL LOW (ref 150–400)
RBC: 5.82 MIL/uL — ABNORMAL HIGH (ref 4.22–5.81)
RDW: 14.1 % (ref 11.5–15.5)
WBC Count: 5.9 K/uL (ref 4.0–10.5)
nRBC: 0 % (ref 0.0–0.2)

## 2024-01-27 LAB — CMP (CANCER CENTER ONLY)
ALT: 24 U/L (ref 0–44)
AST: 20 U/L (ref 15–41)
Albumin: 4 g/dL (ref 3.5–5.0)
Alkaline Phosphatase: 66 U/L (ref 38–126)
Anion gap: 10 (ref 5–15)
BUN: 17 mg/dL (ref 6–20)
CO2: 21 mmol/L — ABNORMAL LOW (ref 22–32)
Calcium: 8.8 mg/dL — ABNORMAL LOW (ref 8.9–10.3)
Chloride: 103 mmol/L (ref 98–111)
Creatinine: 1.35 mg/dL — ABNORMAL HIGH (ref 0.61–1.24)
GFR, Estimated: >60 mL/min (ref >60)
Glucose, Bld: 105 mg/dL — ABNORMAL HIGH (ref 70–99)
Potassium: 4.3 mmol/L (ref 3.5–5.1)
Sodium: 134 mmol/L — ABNORMAL LOW (ref 135–145)
Total Bilirubin: 1.2 mg/dL (ref 0.0–1.2)
Total Protein: 7.1 g/dL (ref 6.5–8.1)

## 2024-01-27 NOTE — Progress Notes (Signed)
 Having cardioversion on 11/26. Then he leave for a [place] cruise. Planning on watchman placement after vacation. Energy is okay. Denies pain. Appetite is good. No blood in stool. Regular BM's. Taking Mounjaro  injections for Diabetes and weight loss.

## 2024-01-27 NOTE — Assessment & Plan Note (Addendum)
#   Stage III colon cancer T3N2-rectosigmoid; distal 5 mm margin; MSS. currently s/p adjuvant chemo FOLFOX x 6 months [finished November 2023];   # Status post low rectal resection and reanastomosis.CT SCAN NOV 2025- Surgical sutures within the rectum, without local recurrent or metastatic disease;  Tiny subpleural predominant pulmonary nodules are unchanged and favored to be benign, likely subpleural lymph nodes; 3. New clustered right lower lobe nodularity medially, likely interval minimal infectious bronchiolitis. [asymptomatic]   No evidence of local recurrence or metastatic disease in the chest, abdomen, or pelvis.   # COLO MAY 2024- polyp x 2 [adenoma; Dr.Vanga- ? 82m/longer].  Awaiting cancer tumor marker today.  Will repeat imaging again in 6 months.  # Hx of Afib-eliquis /amiodarone -on eliquis  [as per cardiology- awaiting watch device]- stable.   # Solitary kidney- GFR- creat- 1.26 [GFR- 57; hx of acute renal failure [JAN 2023-creatinine-5.3]; monitor closely. Stable.  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  # Obesity/diabetes- I re-iterated importance of healthy weight/and weight loss.  Also recommend talking to PCP regarding weight loss medication/adjusting diabetes medication etc- stable.  Defer to PCP.  #  IV access: s/p removal- explantation- PIV  # DISPOSITION:  # Follow up in 6 months- labs- cbc/cmp; CEA; CT CAP- DRI; possible Phlebotomy- Dr.B  # I reviewed the blood work- with the patient in detail; also reviewed the imaging independently [as summarized above]; and with the patient in detail.
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
## 2024-01-28 ENCOUNTER — Other Ambulatory Visit: Payer: Self-pay

## 2024-01-28 ENCOUNTER — Encounter: Payer: Self-pay | Admitting: Internal Medicine

## 2024-01-28 LAB — CEA: CEA: 1.2 ng/mL (ref 0.0–4.7)

## 2024-01-31 ENCOUNTER — Telehealth: Payer: Self-pay | Admitting: Cardiovascular Disease

## 2024-01-31 NOTE — Telephone Encounter (Signed)
 Pt c/o medication issue:  1. Name of Medication: MOUNJARO  10 MG/0.5ML Pen   2. How are you currently taking this medication (dosage and times per day)?   Inject 10 mg into the skin once a week.    3. Are you having a reaction (difficulty breathing--STAT)? No  4. What is your medication issue?Patient normally takes this medication on Friday's. Patient has a procedure scheduled for Wednesday and would like to know if he should take this medication today. Please advise.

## 2024-01-31 NOTE — Telephone Encounter (Signed)
 Spoke with patient about medication instructions via AVS from cardioversion schedule. AVS says to hold jardiance  starting tomorrow until after his cardioversion and he can go ahead and take his last dose of mounjaro  today. Patient verbalizes understanding. No further questions at this time.

## 2024-02-05 ENCOUNTER — Other Ambulatory Visit: Payer: Self-pay

## 2024-02-05 ENCOUNTER — Encounter
Admission: RE | Disposition: A | Discharge status: Home / Self Care | Payer: Self-pay | Source: Home / Self Care | Attending: Cardiovascular Disease

## 2024-02-05 ENCOUNTER — Ambulatory Visit
Admission: RE | Admit: 2024-02-05 | Discharge: 2024-02-05 | Disposition: A | Discharge status: Home / Self Care | Attending: Cardiovascular Disease | Admitting: Cardiovascular Disease

## 2024-02-05 DIAGNOSIS — I4891 Unspecified atrial fibrillation: Secondary | ICD-10-CM | POA: Diagnosis not present

## 2024-02-05 DIAGNOSIS — Z538 Procedure and treatment not carried out for other reasons: Secondary | ICD-10-CM | POA: Insufficient documentation

## 2024-02-05 DIAGNOSIS — I480 Paroxysmal atrial fibrillation: Secondary | ICD-10-CM | POA: Diagnosis present

## 2024-02-05 SURGERY — CARDIOVERSION
Anesthesia: General

## 2024-02-05 MED ORDER — SODIUM CHLORIDE 0.9 % IV SOLN: INTRAVENOUS | Status: DC

## 2024-02-05 NOTE — OR Nursing (Signed)
 Pt arrived for cv he reported he called office about stopping his monjouro. He was told he could take it as usual. His last dose was 6 days ago. Anesthesia cancelled case due to this.

## 2024-02-09 NOTE — Interval H&P Note (Signed)
 History and Physical Interval Note:  02/09/2024 12:36 PM  Tracy Gardner  has presented today for surgery, with the diagnosis of Cardioversion   Afib.  The various methods of treatment have been discussed with the patient and family. After consideration of risks, benefits and other options for treatment, the patient has consented to  Procedure(s): CARDIOVERSION (N/A) as a surgical intervention.  The patient's history has been reviewed, patient examined, no change in status, stable for surgery.  I have reviewed the patient's chart and labs.  Questions were answered to the patient's satisfaction.     Yaret Hush

## 2024-02-09 NOTE — H&P (Signed)

## 2024-02-10 NOTE — Telephone Encounter (Signed)
 Left voicemail for patient to return call. DCCV was rescheduled to 12/9. Will need to reschedule OV with Dr. Kennyth.

## 2024-02-11 ENCOUNTER — Ambulatory Visit: Admitting: Cardiology

## 2024-02-11 NOTE — Telephone Encounter (Signed)
 Left voicemail for patient to return call.   Will also send a MyChart message.

## 2024-02-13 NOTE — Telephone Encounter (Signed)
 Left message on the patient's wife line asking for return call.

## 2024-02-17 NOTE — Telephone Encounter (Signed)
 Spoke with patient. Advised with DCCV tomorrow, will cancel appt with Dr. Kennyth.   Appointment with Dr. Kennyth will be moved to 2/10 at 11 AM. He was given tentative LAAO implant day 2/26. Patient will keep CT appt as scheduled for 12/19.   Will send updated MyChart message with plan to patient as requested.

## 2024-02-18 ENCOUNTER — Ambulatory Visit: Admitting: Certified Registered"

## 2024-02-18 ENCOUNTER — Encounter: Admission: RE | Disposition: A | Payer: Self-pay | Source: Home / Self Care | Attending: Cardiovascular Disease

## 2024-02-18 ENCOUNTER — Encounter: Payer: Self-pay | Admitting: Cardiovascular Disease

## 2024-02-18 ENCOUNTER — Ambulatory Visit: Admitting: Cardiology

## 2024-02-18 ENCOUNTER — Other Ambulatory Visit: Payer: Self-pay

## 2024-02-18 ENCOUNTER — Ambulatory Visit
Admission: RE | Admit: 2024-02-18 | Discharge: 2024-02-18 | Disposition: A | Attending: Cardiovascular Disease | Admitting: Cardiovascular Disease

## 2024-02-18 DIAGNOSIS — I480 Paroxysmal atrial fibrillation: Secondary | ICD-10-CM

## 2024-02-18 DIAGNOSIS — I4891 Unspecified atrial fibrillation: Secondary | ICD-10-CM | POA: Diagnosis present

## 2024-02-18 DIAGNOSIS — J45909 Unspecified asthma, uncomplicated: Secondary | ICD-10-CM | POA: Diagnosis not present

## 2024-02-18 DIAGNOSIS — G4730 Sleep apnea, unspecified: Secondary | ICD-10-CM | POA: Diagnosis not present

## 2024-02-18 DIAGNOSIS — N183 Chronic kidney disease, stage 3 unspecified: Secondary | ICD-10-CM | POA: Diagnosis not present

## 2024-02-18 DIAGNOSIS — I130 Hypertensive heart and chronic kidney disease with heart failure and stage 1 through stage 4 chronic kidney disease, or unspecified chronic kidney disease: Secondary | ICD-10-CM | POA: Diagnosis not present

## 2024-02-18 DIAGNOSIS — Z87891 Personal history of nicotine dependence: Secondary | ICD-10-CM | POA: Diagnosis not present

## 2024-02-18 DIAGNOSIS — R0602 Shortness of breath: Secondary | ICD-10-CM

## 2024-02-18 DIAGNOSIS — E1122 Type 2 diabetes mellitus with diabetic chronic kidney disease: Secondary | ICD-10-CM | POA: Diagnosis not present

## 2024-02-18 DIAGNOSIS — I4819 Other persistent atrial fibrillation: Secondary | ICD-10-CM | POA: Diagnosis not present

## 2024-02-18 DIAGNOSIS — I509 Heart failure, unspecified: Secondary | ICD-10-CM | POA: Diagnosis not present

## 2024-02-18 HISTORY — PX: CARDIOVERSION: SHX1299

## 2024-02-18 SURGERY — CARDIOVERSION
Anesthesia: General

## 2024-02-18 MED ORDER — SODIUM CHLORIDE 0.9 % IV SOLN: INTRAVENOUS | Status: DC

## 2024-02-18 MED ORDER — PROPOFOL 10 MG/ML IV BOLUS
INTRAVENOUS | Status: AC
  Filled 2024-02-18: qty 40

## 2024-02-18 MED ORDER — PROPOFOL 10 MG/ML IV BOLUS
INTRAVENOUS | Status: DC | PRN
  Administered 2024-02-18: 60 mg via INTRAVENOUS

## 2024-02-18 NOTE — Transfer of Care (Signed)
 Immediate Anesthesia Transfer of Care Note  Patient: Tracy Gardner  Procedure(s) Performed: CARDIOVERSION  Patient Location: Cath Lab  Anesthesia Type:General  Level of Consciousness: drowsy  Airway & Oxygen Therapy: Patient Spontanous Breathing and Patient connected to nasal cannula oxygen  Post-op Assessment: Report given to RN  Post vital signs: stable  Last Vitals:  Vitals Value Taken Time  BP 103/78 02/18/24 07:40  Temp    Pulse 64 02/18/24 07:43  Resp 20 02/18/24 07:43  SpO2 95 % 02/18/24 07:43    Last Pain:  Vitals:   02/18/24 0657  TempSrc: Oral         Complications: No notable events documented.

## 2024-02-18 NOTE — CV Procedure (Signed)
 Cardioversion procedure note For atrial fibrillation, persistent.  Procedure Details:  Consent: Risks of procedure as well as the alternatives and risks of each were explained to the (patient/caregiver).  Consent for procedure obtained.  Time Out: Verified patient identification, verified procedure, site/side was marked, verified correct patient position, special equipment/implants available, medications/allergies/relevent history reviewed, required imaging and test results available.  Performed  Patient placed on cardiac monitor, pulse oximetry, supplemental oxygen as necessary.   Sedation given: propofol IV, Dr. Ronni Rumble Pacer pads placed anterior and posterior chest.   Cardioverted 1 time(s).   Cardioverted at  150 J. Synchronized biphasic Converted to NSR   Evaluation: Findings: Post procedure EKG shows: NSR Complications: None Patient did tolerate procedure well.  Time Spent Directly with the Patient:  45 minutes   Dossie Arbour, M.D., Ph.D.

## 2024-02-18 NOTE — H&P (Signed)

## 2024-02-18 NOTE — Anesthesia Preprocedure Evaluation (Signed)
 Anesthesia Evaluation  Patient identified by MRN, date of birth, ID band Patient awake    Reviewed: Allergy & Precautions, NPO status , Patient's Chart, lab work & pertinent test results  History of Anesthesia Complications (+) DIFFICULT AIRWAY and history of anesthetic complications (Intubation 06/27/21: easy mask; grade II view with glidescope; 7.5 ETT placed 1st attempt)  Airway Mallampati: IV   Neck ROM: Full    Dental   Many missing teeth:   Pulmonary asthma , sleep apnea , former smoker (quit 2022)   Pulmonary exam normal breath sounds clear to auscultation       Cardiovascular hypertension, +CHF (cardiomyopathy, EF 35-40%)  Normal cardiovascular exam+ dysrhythmias (a fib on Eliquis )  Rhythm:Regular Rate:Normal     Neuro/Psych negative neurological ROS     GI/Hepatic Colon adenocarcinoma   Endo/Other  diabetes, Type 2  Obesity   Renal/GU Renal disease (stage III CKD; hx nephrectomy as child; nephrolithiasis)     Musculoskeletal   Abdominal   Peds  Hematology  (+) Blood dyscrasia (polycythemia)   Anesthesia Other Findings   Reproductive/Obstetrics                              Anesthesia Physical Anesthesia Plan  ASA: 3  Anesthesia Plan: General   Post-op Pain Management:    Induction: Intravenous  PONV Risk Score and Plan: 2 and Treatment may vary due to age or medical condition, Propofol  infusion and TIVA  Airway Management Planned: Natural Airway  Additional Equipment:   Intra-op Plan:   Post-operative Plan:   Informed Consent: I have reviewed the patients History and Physical, chart, labs and discussed the procedure including the risks, benefits and alternatives for the proposed anesthesia with the patient or authorized representative who has indicated his/her understanding and acceptance.       Plan Discussed with: CRNA  Anesthesia Plan Comments: (LMA/GETA  backup discussed.  Patient consented for risks of anesthesia including but not limited to:  - adverse reactions to medications - damage to eyes, teeth, lips or other oral mucosa - nerve damage due to positioning  - sore throat or hoarseness - damage to heart, brain, nerves, lungs, other parts of body or loss of life  Informed patient about role of CRNA in peri- and intra-operative care.  Patient voiced understanding.)         Anesthesia Quick Evaluation

## 2024-02-18 NOTE — Anesthesia Postprocedure Evaluation (Signed)
 Anesthesia Post Note  Patient: Tracy Gardner  Procedure(s) Performed: CARDIOVERSION  Patient location during evaluation: PACU Anesthesia Type: General Level of consciousness: awake and alert, oriented and patient cooperative Pain management: pain level controlled Vital Signs Assessment: post-procedure vital signs reviewed and stable Respiratory status: spontaneous breathing, nonlabored ventilation and respiratory function stable Cardiovascular status: blood pressure returned to baseline and stable Postop Assessment: adequate PO intake Anesthetic complications: no   No notable events documented.   Last Vitals:  Vitals:   02/18/24 0800 02/18/24 0815  BP: 108/73 106/76  Pulse: 67 63  Resp: 17 12  Temp:    SpO2: 95% 95%    Last Pain:  Vitals:   02/18/24 0815  TempSrc:   PainSc: 0-No pain                 Alfonso Ruths

## 2024-02-19 ENCOUNTER — Encounter: Payer: Self-pay | Admitting: Cardiovascular Disease

## 2024-02-20 ENCOUNTER — Encounter: Payer: Self-pay | Admitting: Internal Medicine

## 2024-02-28 ENCOUNTER — Ambulatory Visit (HOSPITAL_COMMUNITY)
Admission: RE | Admit: 2024-02-28 | Discharge: 2024-02-28 | Disposition: A | Discharge status: Home / Self Care | Source: Ambulatory Visit | Attending: Cardiovascular Disease | Admitting: Cardiovascular Disease

## 2024-02-28 ENCOUNTER — Ambulatory Visit (HOSPITAL_COMMUNITY)

## 2024-02-28 DIAGNOSIS — I480 Paroxysmal atrial fibrillation: Secondary | ICD-10-CM

## 2024-02-28 MED ORDER — IOHEXOL 350 MG/ML SOLN
80.0000 mL | Freq: Once | INTRAVENOUS | Status: AC | PRN
  Administered 2024-02-28: 80 mL via INTRAVENOUS

## 2024-03-02 ENCOUNTER — Ambulatory Visit: Payer: Self-pay

## 2024-03-02 ENCOUNTER — Telehealth: Payer: Self-pay

## 2024-03-02 NOTE — Telephone Encounter (Signed)
 error

## 2024-03-09 ENCOUNTER — Telehealth: Payer: Self-pay

## 2024-03-09 NOTE — Telephone Encounter (Signed)
 Very elliptical ostium with distal pectinate. Max 22/ AVG 17/ Depth 15 Likely use a 20mm or 24mm device Inf/Mid TSP  RAO 9 CAU 7

## 2024-03-11 ENCOUNTER — Other Ambulatory Visit: Payer: Self-pay

## 2024-03-15 ENCOUNTER — Other Ambulatory Visit: Payer: Self-pay

## 2024-03-21 ENCOUNTER — Other Ambulatory Visit: Payer: Self-pay | Admitting: Cardiology

## 2024-03-24 ENCOUNTER — Telehealth: Payer: Self-pay

## 2024-03-24 NOTE — Telephone Encounter (Signed)
 Pt made aware and reported he is not experiencing any symptoms such as chest pain or shortness of breath. Pt also noted he is currently taking Crestor  10 mg daily.   [place], Cadence H, PA-C  Desiderio Russell SAILOR, RN Cardiac CTA showed a high calcium  score. Can we ask if he is having any ischemic symptoms? Can we please make sure he is taking his statin? Cresor 10mg  I think was in my last note.

## 2024-03-27 ENCOUNTER — Telehealth: Payer: Self-pay

## 2024-03-27 DIAGNOSIS — I493 Ventricular premature depolarization: Secondary | ICD-10-CM

## 2024-03-27 DIAGNOSIS — R931 Abnormal findings on diagnostic imaging of heart and coronary circulation: Secondary | ICD-10-CM

## 2024-03-27 DIAGNOSIS — R000 Tachycardia, unspecified: Secondary | ICD-10-CM

## 2024-03-31 ENCOUNTER — Encounter: Payer: Self-pay | Admitting: Internal Medicine

## 2024-03-31 NOTE — Telephone Encounter (Signed)
 Spoke with patient. Explained will need to undergo stress test to evaluate high CAC. Advised scheduling will call him to arrange prior to appt with Dr. Kennyth 2/10.  Patient able to use treadmill.   Will follow up after results.   He verbalized understanding and was grateful for the call.

## 2024-04-02 ENCOUNTER — Encounter: Payer: Self-pay | Admitting: Internal Medicine

## 2024-04-03 ENCOUNTER — Encounter: Payer: Self-pay | Admitting: Internal Medicine

## 2024-04-07 ENCOUNTER — Encounter: Payer: Self-pay | Admitting: Internal Medicine

## 2024-04-08 ENCOUNTER — Ambulatory Visit

## 2024-04-09 ENCOUNTER — Encounter: Payer: Self-pay | Admitting: Internal Medicine

## 2024-04-10 ENCOUNTER — Ambulatory Visit

## 2024-04-13 ENCOUNTER — Ambulatory Visit

## 2024-04-13 ENCOUNTER — Ambulatory Visit: Admission: RE | Admit: 2024-04-13 | Discharge: 2024-04-13 | Attending: Cardiology

## 2024-04-13 ENCOUNTER — Other Ambulatory Visit: Payer: Self-pay | Admitting: Physician Assistant

## 2024-04-13 DIAGNOSIS — R931 Abnormal findings on diagnostic imaging of heart and coronary circulation: Secondary | ICD-10-CM

## 2024-04-13 LAB — NM MYOCAR MULTI W/SPECT W/WALL MOTION / EF
Angina Index: 0
Duke Treadmill Score: 5
Estimated workload: 7
Exercise duration (min): 4 min
Exercise duration (sec): 50 s
LV dias vol: 135 mL (ref 62–150)
LV sys vol: 48 mL
MPHR: 162 {beats}/min
Nuc Stress EF: 64 %
Peak HR: 93 {beats}/min
Percent HR: 57 %
Rest HR: 51 {beats}/min
Rest Nuclear Isotope Dose: 9.8 mCi
SDS: 2
SRS: 8
SSS: 3
ST Depression (mm): 0 mm
Stress Nuclear Isotope Dose: 33 mCi
TID: 1.07

## 2024-04-13 MED ORDER — TECHNETIUM TC 99M TETROFOSMIN IV KIT
32.9800 | PACK | Freq: Once | INTRAVENOUS | Status: AC | PRN
  Administered 2024-04-13: 32.98 via INTRAVENOUS

## 2024-04-13 MED ORDER — TECHNETIUM TC 99M TETROFOSMIN IV KIT
9.7500 | PACK | Freq: Once | INTRAVENOUS | Status: AC | PRN
  Administered 2024-04-13: 9.75 via INTRAVENOUS

## 2024-04-13 MED ORDER — REGADENOSON 0.4 MG/5ML IV SOLN
0.4000 mg | Freq: Once | INTRAVENOUS | Status: AC
  Administered 2024-04-13: 0.4 mg via INTRAVENOUS

## 2024-04-13 NOTE — Progress Notes (Signed)
" °  ° °  Tracy Gardner presented for a nuclear stress test today.  I Lesley LITTIE Maffucci, PA-C, provided direct supervision and was present during the stress portion of the study today, which was completed without significant symptoms, immediate complications, or acute ST/T changes on ECG.  Stress imaging is pending at this time.  Preliminary ECG findings may be listed in the chart, but the stress test result will not be finalized until perfusion imaging is complete.  Lesley LITTIE Maffucci, PA-C  04/13/2024, 11:10 AM    "

## 2024-04-14 ENCOUNTER — Ambulatory Visit: Payer: Self-pay | Admitting: Cardiology

## 2024-04-17 ENCOUNTER — Encounter: Payer: Self-pay | Admitting: Internal Medicine

## 2024-04-21 ENCOUNTER — Ambulatory Visit: Admitting: Cardiology

## 2024-07-20 ENCOUNTER — Other Ambulatory Visit

## 2024-07-27 ENCOUNTER — Inpatient Hospital Stay

## 2024-07-27 ENCOUNTER — Inpatient Hospital Stay: Admitting: Internal Medicine
